# Patient Record
Sex: Female | Born: 1960
Health system: Southern US, Community
[De-identification: ages and names within clinical notes are randomized; demographics above are authoritative.]

## PROBLEM LIST (undated history)

## (undated) DIAGNOSIS — R519 Headache, unspecified: Secondary | ICD-10-CM

## (undated) DIAGNOSIS — R011 Cardiac murmur, unspecified: Secondary | ICD-10-CM

## (undated) DIAGNOSIS — R0600 Dyspnea, unspecified: Secondary | ICD-10-CM

## (undated) DIAGNOSIS — J449 Chronic obstructive pulmonary disease, unspecified: Secondary | ICD-10-CM

## (undated) HISTORY — PX: OTHER SURGICAL HISTORY: SHX169

## (undated) HISTORY — PX: CHOLECYSTECTOMY: SHX55

---

## 2005-04-19 ENCOUNTER — Emergency Department (HOSPITAL_COMMUNITY): Admission: EM | Admit: 2005-04-19 | Discharge: 2005-04-19 | Payer: Self-pay | Admitting: Emergency Medicine

## 2009-05-02 ENCOUNTER — Emergency Department (HOSPITAL_COMMUNITY): Admission: EM | Admit: 2009-05-02 | Discharge: 2009-05-02 | Payer: Self-pay | Admitting: Emergency Medicine

## 2009-05-04 ENCOUNTER — Encounter (INDEPENDENT_AMBULATORY_CARE_PROVIDER_SITE_OTHER): Payer: Self-pay | Admitting: *Deleted

## 2010-11-15 LAB — COMPREHENSIVE METABOLIC PANEL
ALT: 14 U/L (ref 0–35)
Alkaline Phosphatase: 39 U/L (ref 39–117)
BUN: 6 mg/dL (ref 6–23)
CO2: 25 mEq/L (ref 19–32)
Chloride: 107 mEq/L (ref 96–112)
GFR calc non Af Amer: >60 mL/min (ref >60)
Glucose, Bld: 93 mg/dL (ref 70–99)
Potassium: 4.6 mEq/L (ref 3.5–5.1)
Sodium: 139 mEq/L (ref 135–145)
Total Bilirubin: 1 mg/dL (ref 0.3–1.2)

## 2010-11-15 LAB — DIFFERENTIAL
Basophils Absolute: 0 10*3/uL (ref 0.0–0.1)
Basophils Relative: 0 % (ref 0–1)
Eosinophils Absolute: 0.1 10*3/uL (ref 0.0–0.7)
Monocytes Relative: 6 % (ref 3–12)
Neutro Abs: 4.9 10*3/uL (ref 1.7–7.7)
Neutrophils Relative %: 62 % (ref 43–77)

## 2010-11-15 LAB — URINALYSIS, ROUTINE W REFLEX MICROSCOPIC
Bilirubin Urine: NEGATIVE
Nitrite: NEGATIVE
Specific Gravity, Urine: 1.025 (ref 1.005–1.030)
Urobilinogen, UA: 0.2 mg/dL (ref 0.0–1.0)
pH: 6 (ref 5.0–8.0)

## 2010-11-15 LAB — CBC
HCT: 44.6 % (ref 36.0–46.0)
Hemoglobin: 15.8 g/dL — ABNORMAL HIGH (ref 12.0–15.0)
RBC: 4.5 MIL/uL (ref 3.87–5.11)

## 2010-11-15 LAB — URINE CULTURE

## 2010-11-15 LAB — URINE MICROSCOPIC-ADD ON

## 2010-11-15 LAB — PREGNANCY, URINE: Preg Test, Ur: NEGATIVE

## 2013-02-03 ENCOUNTER — Emergency Department (HOSPITAL_COMMUNITY)
Admission: EM | Admit: 2013-02-03 | Discharge: 2013-02-03 | Disposition: A | Discharge status: Home / Self Care | Payer: Self-pay | Attending: Emergency Medicine | Admitting: Emergency Medicine

## 2013-02-03 ENCOUNTER — Emergency Department (HOSPITAL_COMMUNITY): Payer: Self-pay

## 2013-02-03 ENCOUNTER — Encounter (HOSPITAL_COMMUNITY): Payer: Self-pay | Admitting: *Deleted

## 2013-02-03 DIAGNOSIS — F17200 Nicotine dependence, unspecified, uncomplicated: Secondary | ICD-10-CM | POA: Insufficient documentation

## 2013-02-03 DIAGNOSIS — W208XXA Other cause of strike by thrown, projected or falling object, initial encounter: Secondary | ICD-10-CM | POA: Insufficient documentation

## 2013-02-03 DIAGNOSIS — S9001XA Contusion of right ankle, initial encounter: Secondary | ICD-10-CM

## 2013-02-03 DIAGNOSIS — S9000XA Contusion of unspecified ankle, initial encounter: Secondary | ICD-10-CM | POA: Insufficient documentation

## 2013-02-03 DIAGNOSIS — Y929 Unspecified place or not applicable: Secondary | ICD-10-CM | POA: Insufficient documentation

## 2013-02-03 DIAGNOSIS — Y939 Activity, unspecified: Secondary | ICD-10-CM | POA: Insufficient documentation

## 2013-02-03 MED ORDER — IBUPROFEN 600 MG PO TABS: 600.0000 mg | ORAL_TABLET | Freq: Four times a day (QID) | ORAL | Status: DC | PRN

## 2013-02-03 MED ORDER — HYDROCODONE-ACETAMINOPHEN 5-325 MG PO TABS: 1.0000 | ORAL_TABLET | ORAL | Status: DC | PRN

## 2013-02-03 NOTE — ED Notes (Signed)
Rt ankle pain,since Monday, Pt was placing a box with lawn mower parts down on floor and the box hit the medial side of ankle. Contusion present.  Good dp pulse.

## 2013-02-03 NOTE — ED Notes (Signed)
Pt hit rt ankle on a box of lawnmower parts on Monday, pt co increasing pain and swelling, moderate discoloration to inner ankle.

## 2013-02-03 NOTE — ED Provider Notes (Signed)
Medical screening examination/treatment/procedure(s) were performed by non-physician practitioner and as supervising physician I was immediately available for consultation/collaboration. Devoria Albe, MD, Armando Gang   Ward Givens, MD 02/03/13 [phone]

## 2013-02-03 NOTE — ED Provider Notes (Signed)
History    CSN: [id number] Arrival date & time 02/03/13  4098  First MD Initiated Contact with Patient 02/03/13 2039     Chief Complaint  Patient presents with  . Ankle Pain    Rt ankle pain   (Consider location/radiation/quality/duration/timing/severity/associated sxs/prior Treatment) Patient is a 52 y.o. female presenting with ankle pain. The history is provided by the patient.  Ankle Pain Location:  Ankle (right) Time since incident:  3 days Injury: yes   Mechanism of injury comment:  Dropped a box full of stuff that was heavy on inside of ankle Ankle location:  R ankle Pain details:    Quality:  Aching and throbbing   Radiates to:  Does not radiate   Severity:  Moderate   Duration:  3 days   Progression:  Unchanged Chronicity:  New Dislocation: no   Foreign body present:  No foreign bodies Prior injury to area:  No Relieved by:  Nothing Worsened by:  Bearing weight Ineffective treatments:  None tried Associated symptoms: no fever and no neck pain    Crystal Silva is a 52 y.o. female who presents to the ED with right ankle pain x 3 days after dropping a box on the inner aspect of the ankle. She denies any other injuries. The pain has not gotten any better since the injury. There is swelling and bruising.  History reviewed. No pertinent past medical history. Past Surgical History  Procedure Laterality Date  . Cholecystectomy    . Lt knee surgery     History reviewed. No pertinent family history. History  Substance Use Topics  . Smoking status: Current Every Day Smoker -- 1.00 packs/day  . Smokeless tobacco: Not on file  . Alcohol Use: Yes   OB History   Grav Para Term Preterm Abortions TAB SAB Ect Mult Living                 Review of Systems  Constitutional: Negative for fever and activity change.  HENT: Negative for neck pain.   Respiratory: Negative for shortness of breath.   Gastrointestinal: Negative for nausea.  Musculoskeletal:       Right ankle  pain  Skin: Negative for wound.  Psychiatric/Behavioral: The patient is not nervous/anxious.     Allergies  Review of patient's allergies indicates no known allergies.  Home Medications  No current outpatient prescriptions on file. BP 152/61  Pulse 88  Temp(Src) 98 F (36.7 C) (Oral)  Resp 16  SpO2 98%  LMP 01/20/2013 Physical Exam  Nursing note and vitals reviewed. Constitutional: She is oriented to person, place, and time. She appears well-developed and well-nourished. No distress.  HENT:  Head: Normocephalic.  Eyes: EOM are normal.  Neck: Neck supple.  Cardiovascular: Normal rate.   Pulmonary/Chest: Effort normal.  Musculoskeletal:       Right ankle: She exhibits decreased range of motion, swelling and ecchymosis. She exhibits no deformity, no laceration and normal pulse. Tenderness. Medial malleolus tenderness found. Achilles tendon normal.       Feet:  Neurological: She is alert and oriented to person, place, and time. No cranial nerve deficit.  Skin: Skin is warm and dry.  Psychiatric: She has a normal mood and affect. Her behavior is normal.    ED Course  Procedures (including critical care time) Labs Reviewed - No data to display Dg Ankle Complete Right  02/03/2013   *RADIOLOGY REPORT*  Clinical Data: Heavy box dropped on ankle.  Pain.  RIGHT ANKLE - COMPLETE  3+ VIEW  Comparison: None.  Findings: Medial soft tissue swelling.  No fracture or dislocation. Small Achilles spur.  IMPRESSION: Soft tissue swelling.  No fracture.   Original Report Authenticated By: Davonna Belling, M.D.    MDM  52 y.o. female with right ankle injury from 3 days ago. Will apply ASO, she is to elevate, apply ice take the medications for pain and inflammation as directed and return as needed.  Discussed with the patient x-ray and clinical findings and plan of care. All questioned fully answered.    Medication List    TAKE these medications       HYDROcodone-acetaminophen 5-325 MG per tablet   Commonly known as:  NORCO/VICODIN  Take 1 tablet by mouth every 4 (four) hours as needed.     ibuprofen 600 MG tablet  Commonly known as:  ADVIL,MOTRIN  Take 1 tablet (600 mg total) by mouth every 6 (six) hours as needed for pain.         [place], [place] 02/03/13 [phone]

## 2014-02-01 ENCOUNTER — Encounter (HOSPITAL_COMMUNITY): Payer: Self-pay | Admitting: Emergency Medicine

## 2014-02-01 ENCOUNTER — Emergency Department (HOSPITAL_COMMUNITY)
Admission: EM | Admit: 2014-02-01 | Discharge: 2014-02-01 | Disposition: A | Discharge status: Home / Self Care | Payer: BC Managed Care – PPO | Attending: Emergency Medicine | Admitting: Emergency Medicine

## 2014-02-01 DIAGNOSIS — F17200 Nicotine dependence, unspecified, uncomplicated: Secondary | ICD-10-CM | POA: Insufficient documentation

## 2014-02-01 DIAGNOSIS — Z792 Long term (current) use of antibiotics: Secondary | ICD-10-CM | POA: Insufficient documentation

## 2014-02-01 DIAGNOSIS — J0100 Acute maxillary sinusitis, unspecified: Secondary | ICD-10-CM

## 2014-02-01 DIAGNOSIS — IMO0002 Reserved for concepts with insufficient information to code with codable children: Secondary | ICD-10-CM | POA: Insufficient documentation

## 2014-02-01 DIAGNOSIS — Z79899 Other long term (current) drug therapy: Secondary | ICD-10-CM | POA: Insufficient documentation

## 2014-02-01 MED ORDER — DESLORATADINE 5 MG PO TABS: 5.0000 mg | ORAL_TABLET | Freq: Every day | ORAL | Status: DC | PRN

## 2014-02-01 MED ORDER — AZITHROMYCIN 250 MG PO TABS: 250.0000 mg | ORAL_TABLET | Freq: Every day | ORAL | Status: DC

## 2014-02-01 MED ORDER — PREDNISONE 20 MG PO TABS: 40.0000 mg | ORAL_TABLET | Freq: Every day | ORAL | Status: DC

## 2014-02-01 NOTE — Discharge Instructions (Signed)
Sinusitis Sinusitis is redness, soreness, and swelling (inflammation) of the paranasal sinuses. Paranasal sinuses are air pockets within the bones of your face (beneath the eyes, the middle of the forehead, or above the eyes). In healthy paranasal sinuses, mucus is able to drain out, and air is able to circulate through them by way of your nose. However, when your paranasal sinuses are inflamed, mucus and air can become trapped. This can allow bacteria and other germs to grow and cause infection. Sinusitis can develop quickly and last only a short time (acute) or continue over a long period (chronic). Sinusitis that lasts for more than 12 weeks is considered chronic.  CAUSES  Causes of sinusitis include:  Allergies.  Structural abnormalities, such as displacement of the cartilage that separates your nostrils (deviated septum), which can decrease the air flow through your nose and sinuses and affect sinus drainage.  Functional abnormalities, such as when the small hairs (cilia) that line your sinuses and help remove mucus do not work properly or are not present. SYMPTOMS  Symptoms of acute and chronic sinusitis are the same. The primary symptoms are pain and pressure around the affected sinuses. Other symptoms include:  Upper toothache.  Earache.  Headache.  Bad breath.  Decreased sense of smell and taste.  A cough, which worsens when you are lying flat.  Fatigue.  Fever.  Thick drainage from your nose, which often is green and may contain pus (purulent).  Swelling and warmth over the affected sinuses. DIAGNOSIS  Your caregiver will perform a physical exam. During the exam, your caregiver may:  Look in your nose for signs of abnormal growths in your nostrils (nasal polyps).  Tap over the affected sinus to check for signs of infection.  View the inside of your sinuses (endoscopy) with a special imaging device with a light attached (endoscope), which is inserted into your  sinuses. If your caregiver suspects that you have chronic sinusitis, one or more of the following tests may be recommended:  Allergy tests.  Nasal culture--A sample of mucus is taken from your nose and sent to a lab and screened for bacteria.  Nasal cytology--A sample of mucus is taken from your nose and examined by your caregiver to determine if your sinusitis is related to an allergy. TREATMENT  Most cases of acute sinusitis are related to a viral infection and will resolve on their own within 10 days. Sometimes medicines are prescribed to help relieve symptoms (pain medicine, decongestants, nasal steroid sprays, or saline sprays).  However, for sinusitis related to a bacterial infection, your caregiver will prescribe antibiotic medicines. These are medicines that will help kill the bacteria causing the infection.  Rarely, sinusitis is caused by a fungal infection. In theses cases, your caregiver will prescribe antifungal medicine. For some cases of chronic sinusitis, surgery is needed. Generally, these are cases in which sinusitis recurs more than 3 times per year, despite other treatments. HOME CARE INSTRUCTIONS   Drink plenty of water. Water helps thin the mucus so your sinuses can drain more easily.  Use a humidifier.  Inhale steam 3 to 4 times a day (for example, sit in the bathroom with the shower running).  Apply a warm, moist washcloth to your face 3 to 4 times a day, or as directed by your caregiver.  Use saline nasal sprays to help moisten and clean your sinuses.  Take over-the-counter or prescription medicines for pain, discomfort, or fever only as directed by your caregiver. SEEK IMMEDIATE MEDICAL CARE IF:    You have increasing pain or severe headaches.  You have nausea, vomiting, or drowsiness.  You have swelling around your face.  You have vision problems.  You have a stiff neck.  You have difficulty breathing. MAKE SURE YOU:   Understand these  instructions.  Will watch your condition.  Will get help right away if you are not doing well or get worse. Document Released: 07/28/2005 Document Revised: 10/20/2011 Document Reviewed: 08/12/2011 ExitCare Patient Information 2015 [employer]. This information is not intended to replace advice given to you by your health care provider. Make sure you discuss any questions you have with your health care provider.  

## 2014-02-01 NOTE — ED Notes (Signed)
Worsening sob x 1 wk with cough and nasal congestion.

## 2014-02-07 NOTE — ED Provider Notes (Signed)
CSN: [id number]     Arrival date & time 02/01/14  1718 History   First MD Initiated Contact with Patient 02/01/14 1729     Chief Complaint  Patient presents with  . Shortness of Breath     (Consider location/radiation/quality/duration/timing/severity/associated sxs/prior Treatment) HPI  53 year old female presenting with Lopid over one week of facial congestion/pressure and intermittent cough. Some mild shortness of breath. No fevers or chills. Exposure or around her nose and eyes. Worse with leaning forward. Associated with sensation of drainage in the back of her throat. She feels like this makes her cough at times. Mild shortness of breath. No neck pain or stiffness. No ear pain. Has tried some over-the-counter decongestants with minimal relief.  History reviewed. No pertinent past medical history. Past Surgical History  Procedure Laterality Date  . Cholecystectomy    . Lt knee surgery     No family history on file. History  Substance Use Topics  . Smoking status: Current Every Day Smoker -- 1.00 packs/day    Types: Cigarettes  . Smokeless tobacco: Not on file  . Alcohol Use: No   OB History   Grav Para Term Preterm Abortions TAB SAB Ect Mult Living                 Review of Systems  All systems reviewed and negative, other than as noted in HPI.   Allergies  Review of patient's allergies indicates no known allergies.  Home Medications   Prior to Admission medications   Medication Sig Start Date End Date Taking? Authorizing Annella Prowell  azithromycin (ZITHROMAX) 250 MG tablet Take 1 tablet (250 mg total) by mouth daily. Take first 2 tablets together, then 1 every day until finished. 02/01/14   Raeford RazorStephen Kohut, MD  desloratadine (CLARINEX) 5 MG tablet Take 1 tablet (5 mg total) by mouth daily as needed. 02/01/14   Raeford RazorStephen Kohut, MD  HYDROcodone-acetaminophen (NORCO/VICODIN) 5-325 MG per tablet Take 1 tablet by mouth every 4 (four) hours as needed. 02/03/13   Hope Orlene OchM Neese, NP   ibuprofen (ADVIL,MOTRIN) 600 MG tablet Take 1 tablet (600 mg total) by mouth every 6 (six) hours as needed for pain. 02/03/13   Hope Orlene OchM Neese, NP  predniSONE (DELTASONE) 20 MG tablet Take 2 tablets (40 mg total) by mouth daily. 02/01/14   Raeford RazorStephen Kohut, MD   BP 122/65  Pulse 76  Temp(Src) 97.9 F (36.6 C) (Oral)  Resp 16  Ht 5\' 9"  (1.753 m)  Wt 180 lb (81.647 kg)  BMI 26.57 kg/m2  SpO2 98%  LMP 12/02/2013 Physical Exam  Nursing note and vitals reviewed. Constitutional: She appears well-developed and well-nourished. No distress.  HENT:  Head: Normocephalic and atraumatic.  Maxillary sinus tenderness. Boggy nasal mucosa. Posterior pharynx is clear. Handling secretions. Normal stomach phonation. Neck is supple. No adenopathy. Normal tympanic membranes and external auditory canals bilaterally.  Eyes: EOM are normal. Pupils are equal, round, and reactive to light. Right eye exhibits no discharge. Left eye exhibits no discharge.  Neck: Neck supple.  Cardiovascular: Normal rate, regular rhythm and normal heart sounds.  Exam reveals no gallop and no friction rub.   No murmur heard. Pulmonary/Chest: Effort normal and breath sounds normal. No stridor. No respiratory distress.  Abdominal: Soft. She exhibits no distension. There is no tenderness.  Musculoskeletal: She exhibits no edema and no tenderness.  Lymphadenopathy:    She has no cervical adenopathy.  Neurological: She is alert.  Skin: Skin is warm and dry.  Psychiatric: She has  a normal mood and affect. Her behavior is normal. Thought content normal.    ED Course  Procedures (including critical care time) Labs Review Labs Reviewed - No data to display  Imaging Review No results found.   EKG Interpretation None      MDM   Final diagnoses:  Acute maxillary sinusitis, recurrence not specified    53 year old female with symptoms consistent with sinusitis. No respiratory distress. Nontoxic. Plan treatment for  sinusitis.    Raeford RazorStephen Kohut, MD 02/07/14 0430

## 2020-04-06 ENCOUNTER — Encounter: Payer: Self-pay | Admitting: Emergency Medicine

## 2020-04-06 ENCOUNTER — Ambulatory Visit
Admission: EM | Admit: 2020-04-06 | Discharge: 2020-04-06 | Disposition: A | Discharge status: Home / Self Care | Payer: Self-pay

## 2020-04-06 ENCOUNTER — Other Ambulatory Visit: Payer: Self-pay

## 2020-04-06 ENCOUNTER — Ambulatory Visit (INDEPENDENT_AMBULATORY_CARE_PROVIDER_SITE_OTHER): Payer: Self-pay

## 2020-04-06 DIAGNOSIS — M791 Myalgia, unspecified site: Secondary | ICD-10-CM

## 2020-04-06 DIAGNOSIS — R0789 Other chest pain: Secondary | ICD-10-CM

## 2020-04-06 MED ORDER — PREDNISONE 10 MG (21) PO TBPK: ORAL_TABLET | ORAL | 0 refills | Status: DC

## 2020-04-06 NOTE — ED Triage Notes (Signed)
Pain and a knot to Upper RT side of chest with movement or deep breath x 2weeks.  States she was at work plunging a sink and heard something pop in that area and then started hurting

## 2020-04-06 NOTE — ED Provider Notes (Signed)
[facility]   [id number] 04/06/20 Arrival Time: 1344   Chief Complaint  Patient presents with  . Muscle Pain     SUBJECTIVE: History from: patient.  Crystal Silva is a 59 y.o. female who presented to the urgent care with a complaint of right upper chest pain for 2 weeks.  Report is not near the sternum.  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  She localizes the pain to the right upper chest.  He describes the pain as constant and achy.  She has tried OTC medications without relief.  Her symptoms are made worse with ROM.  She denies similar symptoms in the past.  Denies chills, fever, nausea, vomiting, diarrhea   ROS: As per HPI.  All other pertinent ROS negative.     History reviewed. No pertinent past medical history. Past Surgical History:  Procedure Laterality Date  . CHOLECYSTECTOMY    . lt knee surgery     No Known Allergies No current facility-administered medications on file prior to encounter.   Current Outpatient Medications on File Prior to Encounter  Medication Sig Dispense Refill  . cetirizine (ZYRTEC) 10 MG tablet Take 10 mg by mouth daily.    Marland Kitchen azithromycin (ZITHROMAX) 250 MG tablet Take 1 tablet (250 mg total) by mouth daily. Take first 2 tablets together, then 1 every day until finished. 6 tablet 0  . desloratadine (CLARINEX) 5 MG tablet Take 1 tablet (5 mg total) by mouth daily as needed. 10 tablet 0  . HYDROcodone-acetaminophen (NORCO/VICODIN) 5-325 MG per tablet Take 1 tablet by mouth every 4 (four) hours as needed. 15 tablet 0  . ibuprofen (ADVIL,MOTRIN) 600 MG tablet Take 1 tablet (600 mg total) by mouth every 6 (six) hours as needed for pain. 30 tablet 0   Social History   Socioeconomic History  . Marital status: Single    Spouse name: Not on file  . Number of children: Not on file  . Years of education: Not on file  . Highest education level: Not on file  Occupational History  . Not on file  Tobacco Use  . Smoking  status: Current Every Day Smoker    Packs/day: 1.00    Types: Cigarettes  . Smokeless tobacco: Never Used  Substance and Sexual Activity  . Alcohol use: No  . Drug use: No  . Sexual activity: Not on file  Other Topics Concern  . Not on file  Social History Narrative  . Not on file   Social Determinants of Health   Financial Resource Strain:   . Difficulty of Paying Living Expenses: Not on file  Food Insecurity:   . Worried About Programme researcher, broadcasting/film/video in the Last Year: Not on file  . Ran Out of Food in the Last Year: Not on file  Transportation Needs:   . Lack of Transportation (Medical): Not on file  . Lack of Transportation (Non-Medical): Not on file  Physical Activity:   . Days of Exercise per Week: Not on file  . Minutes of Exercise per Session: Not on file  Stress:   . Feeling of Stress : Not on file  Social Connections:   . Frequency of Communication with Friends and Family: Not on file  . Frequency of Social Gatherings with Friends and Family: Not on file  . Attends Religious Services: Not on file  . Active Member of Clubs or Organizations: Not on file  . Attends Banker Meetings: Not on file  .  Marital Status: Not on file  Intimate Partner Violence:   . Fear of Current or Ex-Partner: Not on file  . Emotionally Abused: Not on file  . Physically Abused: Not on file  . Sexually Abused: Not on file   No family history on file.  OBJECTIVE:  Vitals:   04/06/20 1409 04/06/20 1411  BP:  (!) 145/82  Pulse:  83  Resp:  19  Temp:  97.9 F (36.6 C)  TempSrc:  Oral  SpO2:  99%  Weight: 155 lb (70.3 kg)   Height: 5\' 9"  (1.753 m)      Physical Exam Vitals and nursing note reviewed.  Constitutional:      General: She is not in acute distress.    Appearance: Normal appearance. She is normal weight. She is not ill-appearing, toxic-appearing or diaphoretic.  HENT:     Head: Normocephalic.  Cardiovascular:     Rate and Rhythm: Normal rate and regular  rhythm.     Pulses: Normal pulses.     Heart sounds: Normal heart sounds. No murmur heard.  No friction rub. No gallop.   Pulmonary:     Effort: Pulmonary effort is normal. No respiratory distress.     Breath sounds: Normal breath sounds. No stridor. No wheezing, rhonchi or rales.  Chest:     Chest wall: No tenderness.  Musculoskeletal:        General: Tenderness present.     Comments: Right upper chest is without any obvious deformity when compared to the left upper chest.  Patient is able to deep breathing without pain.  Neurological:     Mental Status: She is alert and oriented to person, place, and time.     LABS:  No results found for this or any previous visit (from the past 24 hour(s)).   RADIOLOGY  DG Chest 2 View  Result Date: 04/06/2020 CLINICAL DATA:  59 year old female with pleuritic chest pain for 2 weeks, after "felt a pop" plunging a sink. Rib pain and palpable abnormality. Smoker. EXAM: CHEST - 2 VIEW COMPARISON:  None. FINDINGS: Hyperinflated lungs with symmetric coarse bilateral pulmonary interstitial opacity. No pneumothorax or pleural effusion. No confluent pulmonary opacity. Normal cardiac size and mediastinal contours. Visualized tracheal air column is within normal limits. No displaced right rib fracture identified. Osteopenia. No acute osseous abnormality identified. Right upper quadrant surgical clips. Negative visible bowel gas pattern. IMPRESSION: 1. Chronic lung disease with hyperinflation and coarse bilateral interstitial opacity. 2. No right rib fracture or acute cardiopulmonary abnormality identified. Electronically Signed   By: 59 M.D.   On: 04/06/2020 14:46    Chest-X-ray is negative for bony abnormality including fracture or dislocation.  I have reviewed the x-ray myself and the radiologist interpretation.  I am in agreement with the radiologist interpretation.  ASSESSMENT & PLAN:  1. Muscle pain     Meds ordered this encounter  Medications    . predniSONE (STERAPRED UNI-PAK 21 TAB) 10 MG (21) TBPK tablet    Sig: Take 6 tabs by mouth daily  for 1 days, then 5 tabs for 1 days, then 4 tabs for 1 days, then 3 tabs for 1 days, 2 tabs for 1 days, then 1 tab by mouth daily for 1 days    Dispense:  21 tablet    Refill:  0    Discharge Instructions.  Prednisone was prescribed /take as directed Take OTC Tylenol for pain management Follow RICE instruction that is attached Follow-up with PCP Return or go  to ED for worsening symptoms  Reviewed expectations re: course of current medical issues. Questions answered. Outlined signs and symptoms indicating need for more acute intervention. Patient verbalized understanding. After Visit Summary given.      Note: This document was prepared using Dragon voice recognition software and may include unintentional dictation errors.    Durward Parcel, FNP 04/06/20 1505

## 2020-04-06 NOTE — Discharge Instructions (Addendum)
Prednisone was prescribed /take as directed Take OTC Tylenol for pain management Follow RICE instruction that is attached Follow-up with PCP Return or go to ED for worsening symptoms

## 2021-04-28 ENCOUNTER — Other Ambulatory Visit: Payer: Self-pay

## 2021-04-28 ENCOUNTER — Encounter (HOSPITAL_COMMUNITY): Payer: Self-pay | Admitting: *Deleted

## 2021-04-28 ENCOUNTER — Emergency Department (HOSPITAL_COMMUNITY): Payer: Commercial Managed Care - PPO

## 2021-04-28 ENCOUNTER — Inpatient Hospital Stay (HOSPITAL_COMMUNITY)
Admission: EM | Admit: 2021-04-28 | Discharge: 2021-05-02 | DRG: 190 | Disposition: A | Discharge status: Home / Self Care | Payer: Commercial Managed Care - PPO | Attending: Internal Medicine | Admitting: Internal Medicine

## 2021-04-28 DIAGNOSIS — Q230 Congenital stenosis of aortic valve: Secondary | ICD-10-CM

## 2021-04-28 DIAGNOSIS — Z9049 Acquired absence of other specified parts of digestive tract: Secondary | ICD-10-CM

## 2021-04-28 DIAGNOSIS — J449 Chronic obstructive pulmonary disease, unspecified: Secondary | ICD-10-CM | POA: Diagnosis present

## 2021-04-28 DIAGNOSIS — R0600 Dyspnea, unspecified: Secondary | ICD-10-CM | POA: Diagnosis not present

## 2021-04-28 DIAGNOSIS — E8770 Fluid overload, unspecified: Secondary | ICD-10-CM | POA: Diagnosis present

## 2021-04-28 DIAGNOSIS — M954 Acquired deformity of chest and rib: Secondary | ICD-10-CM | POA: Diagnosis present

## 2021-04-28 DIAGNOSIS — J441 Chronic obstructive pulmonary disease with (acute) exacerbation: Principal | ICD-10-CM | POA: Diagnosis present

## 2021-04-28 DIAGNOSIS — J9621 Acute and chronic respiratory failure with hypoxia: Secondary | ICD-10-CM | POA: Diagnosis present

## 2021-04-28 DIAGNOSIS — Z20822 Contact with and (suspected) exposure to covid-19: Secondary | ICD-10-CM | POA: Diagnosis present

## 2021-04-28 DIAGNOSIS — R911 Solitary pulmonary nodule: Secondary | ICD-10-CM | POA: Diagnosis present

## 2021-04-28 DIAGNOSIS — I359 Nonrheumatic aortic valve disorder, unspecified: Secondary | ICD-10-CM | POA: Diagnosis present

## 2021-04-28 DIAGNOSIS — Z79899 Other long term (current) drug therapy: Secondary | ICD-10-CM

## 2021-04-28 DIAGNOSIS — R079 Chest pain, unspecified: Secondary | ICD-10-CM | POA: Diagnosis present

## 2021-04-28 DIAGNOSIS — R0609 Other forms of dyspnea: Secondary | ICD-10-CM

## 2021-04-28 DIAGNOSIS — F17200 Nicotine dependence, unspecified, uncomplicated: Secondary | ICD-10-CM | POA: Diagnosis present

## 2021-04-28 DIAGNOSIS — Q231 Congenital insufficiency of aortic valve: Secondary | ICD-10-CM

## 2021-04-28 DIAGNOSIS — R7989 Other specified abnormal findings of blood chemistry: Secondary | ICD-10-CM | POA: Diagnosis present

## 2021-04-28 DIAGNOSIS — R002 Palpitations: Secondary | ICD-10-CM | POA: Diagnosis present

## 2021-04-28 DIAGNOSIS — I350 Nonrheumatic aortic (valve) stenosis: Secondary | ICD-10-CM

## 2021-04-28 DIAGNOSIS — Z8249 Family history of ischemic heart disease and other diseases of the circulatory system: Secondary | ICD-10-CM

## 2021-04-28 DIAGNOSIS — F17210 Nicotine dependence, cigarettes, uncomplicated: Secondary | ICD-10-CM | POA: Diagnosis present

## 2021-04-28 MED ORDER — ALBUTEROL SULFATE HFA 108 (90 BASE) MCG/ACT IN AERS
2.0000 | INHALATION_SPRAY | Freq: Once | RESPIRATORY_TRACT | Status: AC
  Administered 2021-04-29: 2 via RESPIRATORY_TRACT
  Filled 2021-04-28: qty 6.7

## 2021-04-28 NOTE — ED Triage Notes (Signed)
Pt with SOB for over a week but has gotten worse today. Denies fevers or cough. Denies any hx of SOB or known covid exposure.

## 2021-04-28 NOTE — ED Provider Notes (Signed)
[facility] EMERGENCY DEPARTMENT Provider Note   CSN: [id number] Arrival date & time: 04/28/21  2157     History Chief Complaint  Patient presents with   Shortness of Breath    Crystal Silva is a 61 y.o. female.  Patient with no known medical history of tobacco abuse and no regular medications here with exertional shortness of breath over the past 1 week.  States she first noticed that when she was at the football game last week and had difficulty breathing with "heart exploding out of her chest" with climbing stairs.  She denies chest pain but felt tight in her chest.  She has no pain or difficulty breathing at rest But only When she gets up and walks around.  No fever or cough.  Did have some leg swelling which has since resolved.  Denies any cardiac history.  She is never had a stress test.  Denies any history of COPD or asthma but continues to smoke.  No leg swelling currently.  Normally sleeps propped up in a recliner.  She denies any fever or cough.  Does have some congestion which she attributes to allergies.  States she had some pain to her left armpit breast several days ago lasted few minutes and has since resolved. Feels like she is still short of breath now but worse when she gets up and tries to walk around.  She feels palpitations and tightness in her chest when she tries to move around but not at rest  The history is provided by the patient.  Shortness of Breath Associated symptoms: no abdominal pain, no chest pain, no cough, no fever, no headaches, no rash and no vomiting       History reviewed. No pertinent past medical history.  There are no problems to display for this patient.   Past Surgical History:  Procedure Laterality Date   CHOLECYSTECTOMY     lt knee surgery       OB History   No obstetric history on file.     History reviewed. No pertinent family history.  Social History   Tobacco Use   Smoking status: Every Day    Packs/day: 1.00    Types:  Cigarettes   Smokeless tobacco: Never  Substance Use Topics   Alcohol use: No   Drug use: No    Home Medications Prior to Admission medications   Medication Sig Start Date End Date Taking? Authorizing Provider  azithromycin (ZITHROMAX) 250 MG tablet Take 1 tablet (250 mg total) by mouth daily. Take first 2 tablets together, then 1 every day until finished. 02/01/14   Raeford Razor, MD  cetirizine (ZYRTEC) 10 MG tablet Take 10 mg by mouth daily.    [provider]  desloratadine (CLARINEX) 5 MG tablet Take 1 tablet (5 mg total) by mouth daily as needed. 02/01/14   Raeford Razor, MD  HYDROcodone-acetaminophen (NORCO/VICODIN) 5-325 MG per tablet Take 1 tablet by mouth every 4 (four) hours as needed. 02/03/13   Janne Napoleon, NP  ibuprofen (ADVIL,MOTRIN) 600 MG tablet Take 1 tablet (600 mg total) by mouth every 6 (six) hours as needed for pain. 02/03/13   Janne Napoleon, NP  predniSONE (STERAPRED UNI-PAK 21 TAB) 10 MG (21) TBPK tablet Take 6 tabs by mouth daily  for 1 days, then 5 tabs for 1 days, then 4 tabs for 1 days, then 3 tabs for 1 days, 2 tabs for 1 days, then 1 tab by mouth daily for 1 days 04/06/20  Durward Parcel, FNP    Allergies    Patient has no known allergies.  Review of Systems   Review of Systems  Constitutional:  Negative for activity change, appetite change and fever.  HENT:  Negative for congestion and rhinorrhea.   Respiratory:  Positive for chest tightness and shortness of breath. Negative for cough.   Cardiovascular:  Negative for chest pain.  Gastrointestinal:  Negative for abdominal pain, nausea and vomiting.  Genitourinary:  Negative for dysuria and hematuria.  Musculoskeletal:  Negative for arthralgias and myalgias.  Skin:  Negative for rash.  Neurological:  Negative for dizziness, weakness and headaches.   all other systems are negative except as noted in the HPI and PMH.   Physical Exam Updated Vital Signs BP 137/84 (BP Location: Right Arm)    Pulse 98   Temp 97.7 F (36.5 C) (Oral)   Resp 16   Ht 5\' 9"  (1.753 m)   Wt 72.6 kg   LMP 12/02/2013   SpO2 95%   BMI 23.63 kg/m   Physical Exam Vitals and nursing note reviewed.  Constitutional:      General: She is not in acute distress.    Appearance: She is well-developed.     Comments: Speaking in short sentence  HENT:     Head: Normocephalic and atraumatic.     Mouth/Throat:     Pharynx: No oropharyngeal exudate.  Eyes:     Conjunctiva/sclera: Conjunctivae normal.     Pupils: Pupils are equal, round, and reactive to light.  Neck:     Comments: No meningismus. Cardiovascular:     Rate and Rhythm: Normal rate and regular rhythm.     Heart sounds: Normal heart sounds. No murmur heard. Pulmonary:     Effort: Pulmonary effort is normal. No respiratory distress.     Breath sounds: Wheezing present.     Comments: Diminished, scattered expiratory wheezing Abdominal:     Palpations: Abdomen is soft.     Tenderness: There is no abdominal tenderness. There is no guarding or rebound.  Musculoskeletal:        General: No tenderness. Normal range of motion.     Cervical back: Normal range of motion and neck supple.  Skin:    General: Skin is warm.  Neurological:     Mental Status: She is alert and oriented to person, place, and time.     Cranial Nerves: No cranial nerve deficit.     Motor: No abnormal muscle tone.     Coordination: Coordination normal.     Comments:  5/5 strength throughout. CN 2-12 intact.Equal grip strength.   Psychiatric:        Behavior: Behavior normal.    ED Results / Procedures / Treatments   Labs (all labs ordered are listed, but only abnormal results are displayed) Labs Reviewed  CBC WITH DIFFERENTIAL/PLATELET - Abnormal; Notable for the following components:      Result Value   Hemoglobin 16.2 (*)    HCT 49.2 (*)    All other components within normal limits  BASIC METABOLIC PANEL - Abnormal; Notable for the following components:    Glucose, Bld 100 (*)    Calcium 8.3 (*)    Anion gap 4 (*)    All other components within normal limits  D-DIMER, QUANTITATIVE - Abnormal; Notable for the following components:   D-Dimer, Quant 2.47 (*)    All other components within normal limits  RESP PANEL BY RT-PCR (FLU A&B, COVID) ARPGX2  BRAIN NATRIURETIC  PEPTIDE  BLOOD GAS, VENOUS  HIV ANTIBODY (ROUTINE TESTING W REFLEX)  MAGNESIUM  CBC  COMPREHENSIVE METABOLIC PANEL  TROPONIN I (HIGH SENSITIVITY)  TROPONIN I (HIGH SENSITIVITY)    EKG EKG Interpretation  Date/Time:  Sunday April 28 2021 23:46:27 EDT Ventricular Rate:  83 PR Interval:  162 QRS Duration: 91 QT Interval:  383 QTC Calculation: 450 R Axis:   -12 Text Interpretation: Sinus rhythm Anterior infarct, old No significant change was found Confirmed by Glynn Octave [phone]) on 04/28/2021 11:52:41 PM  Radiology DG Chest 2 View  Result Date: 04/28/2021 CLINICAL DATA:  Shortness of breath EXAM: CHEST - 2 VIEW COMPARISON:  04/06/2020 FINDINGS: Hyperinflated lungs. The heart size and mediastinal contours are within normal limits. No focal pulmonary opacity. No pleural effusion or pneumothorax. No acute osseous abnormality. Right upper quadrant surgical clips. IMPRESSION: No acute cardiopulmonary disease. Electronically Signed   By: Wiliam Ke M.D.   On: 04/28/2021 22:27   CT Angio Chest PE W and/or Wo Contrast  Result Date: 04/29/2021 CLINICAL DATA:  PE suspected, low/intermediate prob, positive D-dimer. Dyspnea. EXAM: CT ANGIOGRAPHY CHEST WITH CONTRAST TECHNIQUE: Multidetector CT imaging of the chest was performed using the standard protocol during bolus administration of intravenous contrast. Multiplanar CT image reconstructions and MIPs were obtained to evaluate the vascular anatomy. CONTRAST:  55mL OMNIPAQUE IOHEXOL 350 MG/ML SOLN COMPARISON:  None. FINDINGS: Cardiovascular: There is adequate opacification of the pulmonary arterial tree. No intraluminal filling  defect identified to suggest acute pulmonary embolism. The central pulmonary arteries are of normal caliber. There is calcification of the aortic valve leaflets noted. Global cardiac size within normal limits. No significant coronary artery calcification. No pericardial effusion. Mild atherosclerotic calcification noted within the thoracic aorta. No aortic aneurysm. Mediastinum/Nodes: No enlarged mediastinal, hilar, or axillary lymph nodes. Thyroid gland and esophagus demonstrate no significant findings. Lungs/Pleura: Moderate centrilobular emphysema. The lungs appear mildly hyperinflated. Mild diffuse bronchial wall thickening is in keeping with airway inflammation. Mean 7 mm noncalcified pulmonary nodule is seen within the right upper lobe, axial image # 67/6, indeterminate. No focal pulmonary infiltrate. No pneumothorax or pleural effusion. No central obstructing mass. Layering debris is seen within the distal trachea. Upper Abdomen: Limited images of the upper abdomen demonstrates severe progressive atrophy of the right hepatic lobe and compensatory hypertrophy of the left hepatic lobe when compared to prior examination of 05/02/2009. Surgical clips are seen within the hepatic hilum, of unclear significance. Stable intrahepatic biliary ductal dilation within the atrophic right hepatic lobe. No acute abnormality. Musculoskeletal: Degenerative changes are seen within the thoracic spine. No lytic or blastic bone lesions are seen. Remote T12 superior endplate fracture. Review of the MIP images confirms the above findings. IMPRESSION: No pulmonary embolism. Moderate centrilobular emphysema. Mild bronchial wall thickening in keeping with chronic airway inflammation. 7 mm noncalcified indeterminate pulmonary nodule within the right upper lobe. Non-contrast chest CT at 6-12 months is recommended. If the nodule is stable at time of repeat CT, then future CT at 18-24 months (from today's scan) is considered optional for  low-risk patients, but is recommended for high-risk patients. This recommendation follows the consensus statement: Guidelines for Management of Incidental Pulmonary Nodules Detected on CT Images: From the Fleischner Society 2017; Radiology 2017; 284:228-243. Aortic valve calcification. Echocardiography may be helpful to assess the degree of valvular dysfunction. Progressive marked atrophy of the right hepatic lobe, likely related to chronic biliary obstruction, incompletely evaluated on this examination. This was better assessed on CT examination of 05/02/2009. Emphysema (  ICD10-J43.9). Electronically Signed   By: Helyn Numbers M.D.   On: 04/29/2021 01:26    Procedures Procedures   Medications Ordered in ED Medications  albuterol (VENTOLIN HFA) 108 (90 Base) MCG/ACT inhaler 2 puff (has no administration in time range)    ED Course  I have reviewed the triage vital signs and the nursing notes.  Pertinent labs & imaging results that were available during my care of the patient were reviewed by me and considered in my medical decision making (see chart for details).    MDM Rules/Calculators/A&P                          1 week of exertional shortness of breath or chest tightness.  She has no chest pain currently. EKG used for acute ischemia.  Lungs are clear with scant expiratory wheezing.  X-ray is negative  Patient given bronchodilators and steroids.  EKG shows no acute ischemia.  Troponin is negative.  D-dimer is positive.  CT is negative for pulmonary embolism but does show a lung nodule, calcified aortic valve, liver abnormality. Patient made aware of findings.   Presentation is concerning for dyspnea on exertion likely anginal equivalent.  COPD may also be playing a role. Troponins remain flat.  She does have exertional shortness of breath with chest tightness and has never had a cardiac work-up.  She does not have a doctor.  Continue treatment for COPD.  Will need evaluation for  angina as well likely cardiology evaluation echocardiogram given aortic valve findings.  Discussed with Dr. Carren Rang. Final Clinical Impression(s) / ED Diagnoses Final diagnoses:  DOE (dyspnea on exertion)  COPD exacerbation [facility])    Rx / DC Orders ED Discharge Orders     None        Kayde Atkerson, Jeannett Senior, MD 04/29/21 [phone]

## 2021-04-29 ENCOUNTER — Emergency Department (HOSPITAL_COMMUNITY): Payer: Commercial Managed Care - PPO

## 2021-04-29 ENCOUNTER — Encounter (HOSPITAL_COMMUNITY): Payer: Self-pay | Admitting: Radiology

## 2021-04-29 ENCOUNTER — Observation Stay (HOSPITAL_COMMUNITY): Payer: Commercial Managed Care - PPO

## 2021-04-29 DIAGNOSIS — R7989 Other specified abnormal findings of blood chemistry: Secondary | ICD-10-CM | POA: Diagnosis present

## 2021-04-29 DIAGNOSIS — R079 Chest pain, unspecified: Secondary | ICD-10-CM | POA: Diagnosis present

## 2021-04-29 DIAGNOSIS — Z720 Tobacco use: Secondary | ICD-10-CM | POA: Diagnosis not present

## 2021-04-29 DIAGNOSIS — Z8249 Family history of ischemic heart disease and other diseases of the circulatory system: Secondary | ICD-10-CM | POA: Diagnosis not present

## 2021-04-29 DIAGNOSIS — J449 Chronic obstructive pulmonary disease, unspecified: Secondary | ICD-10-CM | POA: Diagnosis not present

## 2021-04-29 DIAGNOSIS — M954 Acquired deformity of chest and rib: Secondary | ICD-10-CM | POA: Diagnosis present

## 2021-04-29 DIAGNOSIS — Z20822 Contact with and (suspected) exposure to covid-19: Secondary | ICD-10-CM | POA: Diagnosis present

## 2021-04-29 DIAGNOSIS — R0600 Dyspnea, unspecified: Secondary | ICD-10-CM | POA: Diagnosis present

## 2021-04-29 DIAGNOSIS — I350 Nonrheumatic aortic (valve) stenosis: Secondary | ICD-10-CM | POA: Diagnosis not present

## 2021-04-29 DIAGNOSIS — F17210 Nicotine dependence, cigarettes, uncomplicated: Secondary | ICD-10-CM | POA: Diagnosis present

## 2021-04-29 DIAGNOSIS — J9621 Acute and chronic respiratory failure with hypoxia: Secondary | ICD-10-CM | POA: Diagnosis present

## 2021-04-29 DIAGNOSIS — I359 Nonrheumatic aortic valve disorder, unspecified: Secondary | ICD-10-CM | POA: Diagnosis present

## 2021-04-29 DIAGNOSIS — Q230 Congenital stenosis of aortic valve: Secondary | ICD-10-CM | POA: Diagnosis not present

## 2021-04-29 DIAGNOSIS — R002 Palpitations: Secondary | ICD-10-CM | POA: Diagnosis present

## 2021-04-29 DIAGNOSIS — F17200 Nicotine dependence, unspecified, uncomplicated: Secondary | ICD-10-CM | POA: Diagnosis not present

## 2021-04-29 DIAGNOSIS — Q231 Congenital insufficiency of aortic valve: Secondary | ICD-10-CM | POA: Diagnosis not present

## 2021-04-29 DIAGNOSIS — J441 Chronic obstructive pulmonary disease with (acute) exacerbation: Secondary | ICD-10-CM | POA: Diagnosis present

## 2021-04-29 DIAGNOSIS — Z79899 Other long term (current) drug therapy: Secondary | ICD-10-CM | POA: Diagnosis not present

## 2021-04-29 DIAGNOSIS — E8770 Fluid overload, unspecified: Secondary | ICD-10-CM | POA: Diagnosis present

## 2021-04-29 DIAGNOSIS — R911 Solitary pulmonary nodule: Secondary | ICD-10-CM | POA: Diagnosis present

## 2021-04-29 DIAGNOSIS — Z9049 Acquired absence of other specified parts of digestive tract: Secondary | ICD-10-CM | POA: Diagnosis not present

## 2021-04-29 LAB — COMPREHENSIVE METABOLIC PANEL
ALT: 12 U/L (ref 0–44)
AST: 12 U/L — ABNORMAL LOW (ref 15–41)
Albumin: 3.7 g/dL (ref 3.5–5.0)
Alkaline Phosphatase: 44 U/L (ref 38–126)
Anion gap: 7 (ref 5–15)
BUN: 14 mg/dL (ref 6–20)
CO2: 26 mmol/L (ref 22–32)
Calcium: 8.5 mg/dL — ABNORMAL LOW (ref 8.9–10.3)
Chloride: 106 mmol/L (ref 98–111)
Creatinine, Ser: 0.56 mg/dL (ref 0.44–1.00)
GFR, Estimated: >60 mL/min (ref >60)
Glucose, Bld: 152 mg/dL — ABNORMAL HIGH (ref 70–99)
Potassium: 3.5 mmol/L (ref 3.5–5.1)
Sodium: 139 mmol/L (ref 135–145)
Total Bilirubin: 0.7 mg/dL (ref 0.3–1.2)
Total Protein: 6.3 g/dL — ABNORMAL LOW (ref 6.5–8.1)

## 2021-04-29 LAB — CBC WITH DIFFERENTIAL/PLATELET
Abs Immature Granulocytes: 0.01 10*3/uL (ref 0.00–0.07)
Basophils Absolute: 0.1 10*3/uL (ref 0.0–0.1)
Basophils Relative: 1 %
Eosinophils Absolute: 0.2 10*3/uL (ref 0.0–0.5)
Eosinophils Relative: 3 %
HCT: 49.2 % — ABNORMAL HIGH (ref 36.0–46.0)
Hemoglobin: 16.2 g/dL — ABNORMAL HIGH (ref 12.0–15.0)
Immature Granulocytes: 0 %
Lymphocytes Relative: 38 %
Lymphs Abs: 2.6 10*3/uL (ref 0.7–4.0)
MCH: 32.7 pg (ref 26.0–34.0)
MCHC: 32.9 g/dL (ref 30.0–36.0)
MCV: 99.2 fL (ref 80.0–100.0)
Monocytes Absolute: 0.4 10*3/uL (ref 0.1–1.0)
Monocytes Relative: 5 %
Neutro Abs: 3.6 10*3/uL (ref 1.7–7.7)
Neutrophils Relative %: 53 %
Platelets: 154 10*3/uL (ref 150–400)
RBC: 4.96 MIL/uL (ref 3.87–5.11)
RDW: 12.6 % (ref 11.5–15.5)
WBC: 6.8 10*3/uL (ref 4.0–10.5)
nRBC: 0 % (ref 0.0–0.2)

## 2021-04-29 LAB — BLOOD GAS, VENOUS
Acid-Base Excess: 1.6 mmol/L (ref 0.0–2.0)
Bicarbonate: 24.7 mmol/L (ref 20.0–28.0)
FIO2: 21
O2 Saturation: 78.3 %
Patient temperature: 36.5
pCO2, Ven: 46.9 mmHg (ref 44.0–60.0)
pH, Ven: 7.368 (ref 7.250–7.430)
pO2, Ven: 42.2 mmHg (ref 32.0–45.0)

## 2021-04-29 LAB — BASIC METABOLIC PANEL
Anion gap: 4 — ABNORMAL LOW (ref 5–15)
BUN: 15 mg/dL (ref 6–20)
CO2: 28 mmol/L (ref 22–32)
Calcium: 8.3 mg/dL — ABNORMAL LOW (ref 8.9–10.3)
Chloride: 106 mmol/L (ref 98–111)
Creatinine, Ser: 0.71 mg/dL (ref 0.44–1.00)
GFR, Estimated: >60 mL/min (ref >60)
Glucose, Bld: 100 mg/dL — ABNORMAL HIGH (ref 70–99)
Potassium: 3.9 mmol/L (ref 3.5–5.1)
Sodium: 138 mmol/L (ref 135–145)

## 2021-04-29 LAB — MAGNESIUM: Magnesium: 2 mg/dL (ref 1.7–2.4)

## 2021-04-29 LAB — ECHOCARDIOGRAM COMPLETE
AR max vel: 0.81 cm2
AV Area VTI: 0.84 cm2
AV Area mean vel: 0.81 cm2
AV Mean grad: 40.2 mmHg
AV Peak grad: 64 mmHg
Ao pk vel: 4 m/s
Area-P 1/2: 2.81 cm2
Height: 69 in
MV VTI: 2.62 cm2
S' Lateral: 2.5 cm
Weight: 2612.8 oz

## 2021-04-29 LAB — CBC
HCT: 44.7 % (ref 36.0–46.0)
Hemoglobin: 15.1 g/dL — ABNORMAL HIGH (ref 12.0–15.0)
MCH: 32.5 pg (ref 26.0–34.0)
MCHC: 33.8 g/dL (ref 30.0–36.0)
MCV: 96.3 fL (ref 80.0–100.0)
Platelets: 140 10*3/uL — ABNORMAL LOW (ref 150–400)
RBC: 4.64 MIL/uL (ref 3.87–5.11)
RDW: 12.5 % (ref 11.5–15.5)
WBC: 5.8 10*3/uL (ref 4.0–10.5)
nRBC: 0 % (ref 0.0–0.2)

## 2021-04-29 LAB — TROPONIN I (HIGH SENSITIVITY)
Troponin I (High Sensitivity): 3 ng/L (ref <18)
Troponin I (High Sensitivity): 3 ng/L (ref <18)

## 2021-04-29 LAB — BRAIN NATRIURETIC PEPTIDE: B Natriuretic Peptide: 37 pg/mL (ref 0.0–100.0)

## 2021-04-29 LAB — RESP PANEL BY RT-PCR (FLU A&B, COVID) ARPGX2
Influenza A by PCR: NEGATIVE
Influenza B by PCR: NEGATIVE
SARS Coronavirus 2 by RT PCR: NEGATIVE

## 2021-04-29 LAB — GLUCOSE, CAPILLARY
Glucose-Capillary: 134 mg/dL — ABNORMAL HIGH (ref 70–99)
Glucose-Capillary: 120 mg/dL — ABNORMAL HIGH (ref 70–99)
Glucose-Capillary: 127 mg/dL — ABNORMAL HIGH (ref 70–99)

## 2021-04-29 LAB — HIV ANTIBODY (ROUTINE TESTING W REFLEX): HIV Screen 4th Generation wRfx: NONREACTIVE

## 2021-04-29 LAB — D-DIMER, QUANTITATIVE: D-Dimer, Quant: 2.47 ug/mL-FEU — ABNORMAL HIGH (ref 0.00–0.50)

## 2021-04-29 MED ORDER — ACETAMINOPHEN 650 MG RE SUPP: 650.0000 mg | Freq: Four times a day (QID) | RECTAL | Status: DC | PRN

## 2021-04-29 MED ORDER — INSULIN ASPART 100 UNIT/ML IJ SOLN
0.0000 [IU] | Freq: Three times a day (TID) | INTRAMUSCULAR | Status: DC
  Administered 2021-04-29 – 2021-05-01 (×5): 1 [IU] via SUBCUTANEOUS

## 2021-04-29 MED ORDER — PREDNISONE 50 MG PO TABS
60.0000 mg | ORAL_TABLET | Freq: Once | ORAL | Status: AC
  Administered 2021-04-29: 60 mg via ORAL
  Filled 2021-04-29: qty 1

## 2021-04-29 MED ORDER — INSULIN ASPART 100 UNIT/ML IJ SOLN: 0.0000 [IU] | Freq: Every day | INTRAMUSCULAR | Status: DC

## 2021-04-29 MED ORDER — PREDNISONE 20 MG PO TABS
40.0000 mg | ORAL_TABLET | Freq: Every day | ORAL | Status: DC
  Administered 2021-04-29: 40 mg via ORAL
  Filled 2021-04-29: qty 2

## 2021-04-29 MED ORDER — METOPROLOL TARTRATE 5 MG/5ML IV SOLN: 5.0000 mg | INTRAVENOUS | Status: DC | PRN

## 2021-04-29 MED ORDER — ACETAMINOPHEN 325 MG PO TABS
650.0000 mg | ORAL_TABLET | Freq: Four times a day (QID) | ORAL | Status: DC | PRN
  Administered 2021-04-29: 650 mg via ORAL
  Filled 2021-04-29: qty 2

## 2021-04-29 MED ORDER — HYDRALAZINE HCL 20 MG/ML IJ SOLN: 10.0000 mg | INTRAMUSCULAR | Status: DC | PRN

## 2021-04-29 MED ORDER — MOMETASONE FURO-FORMOTEROL FUM 200-5 MCG/ACT IN AERO
2.0000 | INHALATION_SPRAY | Freq: Two times a day (BID) | RESPIRATORY_TRACT | Status: DC
  Administered 2021-04-29 – 2021-05-02 (×6): 2 via RESPIRATORY_TRACT
  Filled 2021-04-29: qty 8.8

## 2021-04-29 MED ORDER — ASPIRIN 81 MG PO CHEW
324.0000 mg | CHEWABLE_TABLET | Freq: Once | ORAL | Status: AC
  Administered 2021-04-29: 324 mg via ORAL
  Filled 2021-04-29: qty 4

## 2021-04-29 MED ORDER — ALBUTEROL SULFATE (2.5 MG/3ML) 0.083% IN NEBU: 2.5000 mg | INHALATION_SOLUTION | RESPIRATORY_TRACT | Status: DC | PRN

## 2021-04-29 MED ORDER — METHYLPREDNISOLONE SODIUM SUCC 40 MG IJ SOLR
40.0000 mg | Freq: Three times a day (TID) | INTRAMUSCULAR | Status: DC
  Administered 2021-04-29 – 2021-05-02 (×8): 40 mg via INTRAVENOUS
  Filled 2021-04-29 (×8): qty 1

## 2021-04-29 MED ORDER — MORPHINE SULFATE (PF) 2 MG/ML IV SOLN: 2.0000 mg | INTRAVENOUS | Status: DC | PRN

## 2021-04-29 MED ORDER — SENNOSIDES-DOCUSATE SODIUM 8.6-50 MG PO TABS: 1.0000 | ORAL_TABLET | Freq: Every evening | ORAL | Status: DC | PRN

## 2021-04-29 MED ORDER — AZITHROMYCIN 250 MG PO TABS
500.0000 mg | ORAL_TABLET | Freq: Every day | ORAL | Status: DC
  Administered 2021-04-29 – 2021-05-02 (×3): 500 mg via ORAL
  Filled 2021-04-29 (×3): qty 2

## 2021-04-29 MED ORDER — IPRATROPIUM-ALBUTEROL 0.5-2.5 (3) MG/3ML IN SOLN
3.0000 mL | Freq: Four times a day (QID) | RESPIRATORY_TRACT | Status: DC
  Administered 2021-04-29 (×4): 3 mL via RESPIRATORY_TRACT
  Filled 2021-04-29 (×4): qty 3

## 2021-04-29 MED ORDER — ONDANSETRON HCL 4 MG PO TABS: 4.0000 mg | ORAL_TABLET | Freq: Four times a day (QID) | ORAL | Status: DC | PRN

## 2021-04-29 MED ORDER — HYDROCODONE-ACETAMINOPHEN 5-325 MG PO TABS
1.0000 | ORAL_TABLET | ORAL | Status: DC | PRN
  Administered 2021-04-29: 1 via ORAL
  Filled 2021-04-29: qty 1

## 2021-04-29 MED ORDER — HEPARIN SODIUM (PORCINE) 5000 UNIT/ML IJ SOLN
5000.0000 [IU] | Freq: Three times a day (TID) | INTRAMUSCULAR | Status: DC
  Administered 2021-04-29 – 2021-04-30 (×3): 5000 [IU] via SUBCUTANEOUS
  Filled 2021-04-29 (×3): qty 1

## 2021-04-29 MED ORDER — IOHEXOL 350 MG/ML SOLN
75.0000 mL | Freq: Once | INTRAVENOUS | Status: AC | PRN
  Administered 2021-04-29: 75 mL via INTRAVENOUS

## 2021-04-29 MED ORDER — ONDANSETRON HCL 4 MG/2ML IJ SOLN: 4.0000 mg | Freq: Four times a day (QID) | INTRAMUSCULAR | Status: DC | PRN

## 2021-04-29 MED ORDER — POTASSIUM CHLORIDE CRYS ER 20 MEQ PO TBCR
40.0000 meq | EXTENDED_RELEASE_TABLET | Freq: Once | ORAL | Status: AC
  Administered 2021-04-29: 40 meq via ORAL
  Filled 2021-04-29: qty 2

## 2021-04-29 MED ORDER — TRAZODONE HCL 50 MG PO TABS: 50.0000 mg | ORAL_TABLET | Freq: Every evening | ORAL | Status: DC | PRN

## 2021-04-29 NOTE — Progress Notes (Addendum)
PROGRESS NOTE    Crystal Silva  XIP:[id number] DOB: 1960/10/01 DOA: 04/28/2021 PCP: Patient, No Pcp Per (Inactive)   Brief Narrative:  60 year old female known past medical history, tobacco user-1 pack daily comes to the hospital with dyspnea on exertion which has progressed over the last month.  She was admitted to the hospital for COPD exacerbation.  Echocardiogram ordered due to new diagnosis of systolic murmur.  CTA chest was negative for pulmonary embolism, showed moderate centrilobular emphysema.  There was also 7 mm noncalcified pulmonary nodule in the right upper lobe and was recommended repeat CT scan in 6 to 12 months.   Assessment & Plan:   Active Problems:   Chest pain   Tobacco use disorder   Elevated d-dimer   COPD (chronic obstructive pulmonary disease) (HCC)   Aortic valve disorder  Acute respiratory distress secondary to COPD exacerbation Tobacco use - Still having diffuse diminished breath sounds. - IV steroids, bronchodilators scheduled and as needed - I-S/flutter - Azithromycin day 1 of 5 -Counseled to quit using tobacco.  Nicotine patch as needed  New systolic murmur - Echocardiogram ordered  Pulmonary nodule, 7 mm noncalcified - We will need repeat CT scan in 6-12 months with outpatient PCP     DVT prophylaxis: heparin injection 5,000 Units Start: 04/29/21 0600 SCDs Start: 04/29/21 0405  Code Status: Full code Family Communication:     Dispo: The patient is from: Home              Anticipated d/c is to: Home              Patient currently is not medically stable to d/c.   Difficult to place patient No       Nutritional status           Body mass index is 24.12 kg/m.           Subjective: Still having exertional dyspnea with minimal movement even in the bed.  Review of Systems Otherwise negative except as per HPI, including: General: Denies fever, chills, night sweats or unintended weight loss. Resp: Denies  hemoptysis Cardiac: Denies chest pain, palpitations, orthopnea, paroxysmal nocturnal dyspnea. GI: Denies abdominal pain, nausea, vomiting, diarrhea or constipation GU: Denies dysuria, frequency, hesitancy or incontinence MS: Denies muscle aches, joint pain or swelling Neuro: Denies headache, neurologic deficits (focal weakness, numbness, tingling), abnormal gait Psych: Denies anxiety, depression, SI/HI/AVH Skin: Denies new rashes or lesions ID: Denies sick contacts, exotic exposures, travel  Examination:  General exam: Appears calm and comfortable  Respiratory system: Bilateral diffuse diminished breath sounds Cardiovascular system: + systolic murmurs 4/6 in left 2nd and 4th IC space S1 & S2 heard, RRR. No JVD,  rubs, gallops or clicks. No pedal edema. Gastrointestinal system: Abdomen is nondistended, soft and nontender. No organomegaly or masses felt. Normal bowel sounds heard. Central nervous system: Alert and oriented. No focal neurological deficits. Extremities: Symmetric 5 x 5 power. Skin: No rashes, lesions or ulcers Psychiatry: Judgement and insight appear normal. Mood & affect appropriate.     Objective: Vitals:   04/29/21 0344 04/29/21 0434 04/29/21 0500 04/29/21 0720  BP:  137/73    Pulse:  81    Resp:  18    Temp:  97.7 F (36.5 C)    TempSrc:  Oral    SpO2: 100% 94%  96%  Weight:   74.1 kg   Height:        Intake/Output Summary (Last 24 hours) at 04/29/2021 1115 Last data filed  at 04/29/2021 0900 Gross per 24 hour  Intake 240 ml  Output --  Net 240 ml   Filed Weights   04/28/21 2201 04/29/21 0500  Weight: 72.6 kg 74.1 kg     Data Reviewed:   CBC: Recent Labs  Lab 04/29/21 0007 04/29/21 0505  WBC 6.8 5.8  NEUTROABS 3.6  --   HGB 16.2* 15.1*  HCT 49.2* 44.7  MCV 99.2 96.3  PLT 154 140*   Basic Metabolic Panel: Recent Labs  Lab 04/29/21 0007 04/29/21 0505  NA 138 139  K 3.9 3.5  CL 106 106  CO2 28 26  GLUCOSE 100* 152*  BUN 15 14   CREATININE 0.71 0.56  CALCIUM 8.3* 8.5*  MG  --  2.0   GFR: Estimated Creatinine Clearance: 80.1 mL/min (by C-G formula based on SCr of 0.56 mg/dL). Liver Function Tests: Recent Labs  Lab 04/29/21 0505  AST 12*  ALT 12  ALKPHOS 44  BILITOT 0.7  PROT 6.3*  ALBUMIN 3.7   No results for input(s): LIPASE, AMYLASE in the last 168 hours. No results for input(s): AMMONIA in the last 168 hours. Coagulation Profile: No results for input(s): INR, PROTIME in the last 168 hours. Cardiac Enzymes: No results for input(s): CKTOTAL, CKMB, CKMBINDEX, TROPONINI in the last 168 hours. BNP (last 3 results) No results for input(s): PROBNP in the last 8760 hours. HbA1C: No results for input(s): HGBA1C in the last 72 hours. CBG: No results for input(s): GLUCAP in the last 168 hours. Lipid Profile: No results for input(s): CHOL, HDL, LDLCALC, TRIG, CHOLHDL, LDLDIRECT in the last 72 hours. Thyroid Function Tests: No results for input(s): TSH, T4TOTAL, FREET4, T3FREE, THYROIDAB in the last 72 hours. Anemia Panel: No results for input(s): VITAMINB12, FOLATE, FERRITIN, TIBC, IRON, RETICCTPCT in the last 72 hours. Sepsis Labs: No results for input(s): PROCALCITON, LATICACIDVEN in the last 168 hours.  Recent Results (from the past 240 hour(s))  Resp Panel by RT-PCR (Flu A&B, Covid) Nasopharyngeal Swab     Status: None   Collection Time: 04/29/21 12:05 AM   Specimen: Nasopharyngeal Swab; Nasopharyngeal(NP) swabs in vial transport medium  Result Value Ref Range Status   SARS Coronavirus 2 by RT PCR NEGATIVE NEGATIVE Final    Comment: (NOTE) SARS-CoV-2 target nucleic acids are NOT DETECTED.  The SARS-CoV-2 RNA is generally detectable in upper respiratory specimens during the acute phase of infection. The lowest concentration of SARS-CoV-2 viral copies this assay can detect is 138 copies/mL. A negative result does not preclude SARS-Cov-2 infection and should not be used as the sole basis for  treatment or other patient management decisions. A negative result may occur with  improper specimen collection/handling, submission of specimen other than nasopharyngeal swab, presence of viral mutation(s) within the areas targeted by this assay, and inadequate number of viral copies(<138 copies/mL). A negative result must be combined with clinical observations, patient history, and epidemiological information. The expected result is Negative.  Fact Sheet for Patients:  BloggerCourse.com  Fact Sheet for Healthcare Providers:  SeriousBroker.it  This test is no t yet approved or cleared by the [place] FDA and  has been authorized for detection and/or diagnosis of SARS-CoV-2 by FDA under an Emergency Use Authorization (EUA). This EUA will remain  in effect (meaning this test can be used) for the duration of the COVID-19 declaration under Section 564(b)(1) of the Act, 21 U.S.C.section 360bbb-3(b)(1), unless the authorization is terminated  or revoked sooner.       Influenza A  by PCR NEGATIVE NEGATIVE Final   Influenza B by PCR NEGATIVE NEGATIVE Final    Comment: (NOTE) The Xpert Xpress SARS-CoV-2/FLU/RSV plus assay is intended as an aid in the diagnosis of influenza from Nasopharyngeal swab specimens and should not be used as a sole basis for treatment. Nasal washings and aspirates are unacceptable for Xpert Xpress SARS-CoV-2/FLU/RSV testing.  Fact Sheet for Patients: BloggerCourse.com  Fact Sheet for Healthcare Providers: SeriousBroker.it  This test is not yet approved or cleared by the [place] FDA and has been authorized for detection and/or diagnosis of SARS-CoV-2 by FDA under an Emergency Use Authorization (EUA). This EUA will remain in effect (meaning this test can be used) for the duration of the COVID-19 declaration under Section 564(b)(1) of the Act, 21  U.S.C. section 360bbb-3(b)(1), unless the authorization is terminated or revoked.  Performed at [facility], [address], [place], [place] [address]          Radiology Studies: DG Chest 2 View  Result Date: 04/28/2021 CLINICAL DATA:  Shortness of breath EXAM: CHEST - 2 VIEW COMPARISON:  04/06/2020 FINDINGS: Hyperinflated lungs. The heart size and mediastinal contours are within normal limits. No focal pulmonary opacity. No pleural effusion or pneumothorax. No acute osseous abnormality. Right upper quadrant surgical clips. IMPRESSION: No acute cardiopulmonary disease. Electronically Signed   By: Wiliam Ke M.D.   On: 04/28/2021 22:27   CT Angio Chest PE W and/or Wo Contrast  Result Date: 04/29/2021 CLINICAL DATA:  PE suspected, low/intermediate prob, positive D-dimer. Dyspnea. EXAM: CT ANGIOGRAPHY CHEST WITH CONTRAST TECHNIQUE: Multidetector CT imaging of the chest was performed using the standard protocol during bolus administration of intravenous contrast. Multiplanar CT image reconstructions and MIPs were obtained to evaluate the vascular anatomy. CONTRAST:  71mL OMNIPAQUE IOHEXOL 350 MG/ML SOLN COMPARISON:  None. FINDINGS: Cardiovascular: There is adequate opacification of the pulmonary arterial tree. No intraluminal filling defect identified to suggest acute pulmonary embolism. The central pulmonary arteries are of normal caliber. There is calcification of the aortic valve leaflets noted. Global cardiac size within normal limits. No significant coronary artery calcification. No pericardial effusion. Mild atherosclerotic calcification noted within the thoracic aorta. No aortic aneurysm. Mediastinum/Nodes: No enlarged mediastinal, hilar, or axillary lymph nodes. Thyroid gland and esophagus demonstrate no significant findings. Lungs/Pleura: Moderate centrilobular emphysema. The lungs appear mildly hyperinflated. Mild diffuse bronchial wall thickening is in keeping with airway inflammation.  Mean 7 mm noncalcified pulmonary nodule is seen within the right upper lobe, axial image # 67/6, indeterminate. No focal pulmonary infiltrate. No pneumothorax or pleural effusion. No central obstructing mass. Layering debris is seen within the distal trachea. Upper Abdomen: Limited images of the upper abdomen demonstrates severe progressive atrophy of the right hepatic lobe and compensatory hypertrophy of the left hepatic lobe when compared to prior examination of 05/02/2009. Surgical clips are seen within the hepatic hilum, of unclear significance. Stable intrahepatic biliary ductal dilation within the atrophic right hepatic lobe. No acute abnormality. Musculoskeletal: Degenerative changes are seen within the thoracic spine. No lytic or blastic bone lesions are seen. Remote T12 superior endplate fracture. Review of the MIP images confirms the above findings. IMPRESSION: No pulmonary embolism. Moderate centrilobular emphysema. Mild bronchial wall thickening in keeping with chronic airway inflammation. 7 mm noncalcified indeterminate pulmonary nodule within the right upper lobe. Non-contrast chest CT at 6-12 months is recommended. If the nodule is stable at time of repeat CT, then future CT at 18-24 months (from today's scan) is considered optional for low-risk patients,  but is recommended for high-risk patients. This recommendation follows the consensus statement: Guidelines for Management of Incidental Pulmonary Nodules Detected on CT Images: From the Fleischner Society 2017; Radiology 2017; 284:228-243. Aortic valve calcification. Echocardiography may be helpful to assess the degree of valvular dysfunction. Progressive marked atrophy of the right hepatic lobe, likely related to chronic biliary obstruction, incompletely evaluated on this examination. This was better assessed on CT examination of 05/02/2009. Emphysema (ICD10-J43.9). Electronically Signed   By: Helyn Numbers M.D.   On: 04/29/2021 01:26         Scheduled Meds:  heparin  5,000 Units Subcutaneous Q8H   insulin aspart  0-5 Units Subcutaneous QHS   insulin aspart  0-9 Units Subcutaneous TID WC   ipratropium-albuterol  3 mL Nebulization Q6H   methylPREDNISolone (SOLU-MEDROL) injection  40 mg Intravenous Q8H   mometasone-formoterol  2 puff Inhalation BID   Continuous Infusions:   LOS: 0 days   Time spent= 35 mins    Joandry Slagter Joline Maxcy, MD [facility]  If 7PM-7AM, please contact night-coverage  04/29/2021, 11:15 AM

## 2021-04-29 NOTE — Consult Note (Addendum)
Cardiology Consultation:   Patient ID: SHARDAY MICHL MRN: [id number]; DOB: 1961/06/21  Admit date: 04/28/2021 Date of Consult: 04/29/2021  PCP:  Patient, No Pcp Per (Inactive)   [facility] Providers Cardiologist:  None        Patient Profile:   Crystal Silva is a 60 y.o. female with a hx of tobacco abuse who is being seen 04/29/2021 for the evaluation of DOE at the request of Dr. Nelson Chimes.  History of Present Illness:   Crystal Silva is a 60 yo female with not significant PMHx. She smokes 1 ppd and is sedentary. She complains of several month history of worsening DOE and heart racing with activity. Walking up stairs at football game Fri night caused significant dyspnea , heart racing and ankle edema. Yest just changing trash bag caused dyspnea. Denies any chest pain.   History reviewed. No pertinent past medical history.  Past Surgical History:  Procedure Laterality Date   CHOLECYSTECTOMY     lt knee surgery       Home Medications:  Prior to Admission medications   Medication Sig Start Date End Date Taking? Authorizing Provider  azithromycin (ZITHROMAX) 250 MG tablet Take 1 tablet (250 mg total) by mouth daily. Take first 2 tablets together, then 1 every day until finished. Patient not taking: No sig reported 02/01/14   Raeford Razor, MD  desloratadine (CLARINEX) 5 MG tablet Take 1 tablet (5 mg total) by mouth daily as needed. Patient not taking: No sig reported 02/01/14   Raeford Razor, MD  HYDROcodone-acetaminophen (NORCO/VICODIN) 5-325 MG per tablet Take 1 tablet by mouth every 4 (four) hours as needed. Patient not taking: No sig reported 02/03/13   Janne Napoleon, NP  ibuprofen (ADVIL,MOTRIN) 600 MG tablet Take 1 tablet (600 mg total) by mouth every 6 (six) hours as needed for pain. Patient not taking: No sig reported 02/03/13   Kerrie Buffalo M, NP  predniSONE (STERAPRED UNI-PAK 21 TAB) 10 MG (21) TBPK tablet Take 6 tabs by mouth daily  for 1 days, then 5 tabs for 1 days, then 4  tabs for 1 days, then 3 tabs for 1 days, 2 tabs for 1 days, then 1 tab by mouth daily for 1 days Patient not taking: No sig reported 04/06/20   Durward Parcel, FNP    Inpatient Medications: Scheduled Meds:  heparin  5,000 Units Subcutaneous Q8H   ipratropium-albuterol  3 mL Nebulization Q6H   mometasone-formoterol  2 puff Inhalation BID   potassium chloride  40 mEq Oral Once   predniSONE  40 mg Oral Q breakfast   Continuous Infusions:  PRN Meds: acetaminophen **OR** acetaminophen, albuterol, hydrALAZINE, HYDROcodone-acetaminophen, metoprolol tartrate, morphine injection, ondansetron **OR** ondansetron (ZOFRAN) IV, senna-docusate, traZODone  Allergies:   No Known Allergies  Social History:   Social History   Socioeconomic History   Marital status: Single    Spouse name: Not on file   Number of children: Not on file   Years of education: Not on file   Highest education level: Not on file  Occupational History   Not on file  Tobacco Use   Smoking status: Every Day    Packs/day: 1.00    Types: Cigarettes   Smokeless tobacco: Never  Substance and Sexual Activity   Alcohol use: No   Drug use: No   Sexual activity: Not on file  Other Topics Concern   Not on file  Social History Narrative   Lives with daughter and grandson   Social  Determinants of Health   Financial Resource Strain: Not on file  Food Insecurity: Not on file  Transportation Needs: Not on file  Physical Activity: Not on file  Stress: Not on file  Social Connections: Not on file  Intimate Partner Violence: Not on file    Family History:     Family History  Problem Relation Age of Onset   Heart failure Mother    Heart disease Mother        stent     ROS:  Please see the history of present illness.  Review of Systems  Constitutional: Negative.  HENT: Negative.    Eyes: Negative.   Cardiovascular:  Positive for dyspnea on exertion, leg swelling and palpitations.  Respiratory: Negative.     Hematologic/Lymphatic: Negative.   Musculoskeletal: Negative.  Negative for joint pain.  Gastrointestinal: Negative.   Genitourinary: Negative.   Neurological: Negative.    All other ROS reviewed and negative.     Physical Exam/Data:   Vitals:   04/29/21 0344 04/29/21 0434 04/29/21 0500 04/29/21 0720  BP:  137/73    Pulse:  81    Resp:  18    Temp:  97.7 F (36.5 C)    TempSrc:  Oral    SpO2: 100% 94%  96%  Weight:   74.1 kg   Height:       No intake or output data in the 24 hours ending 04/29/21 0857 Last 3 Weights 04/29/2021 04/28/2021 04/06/2020  Weight (lbs) 163 lb 4.8 oz 160 lb 155 lb  Weight (kg) 74.072 kg 72.576 kg 70.308 kg     Body mass index is 24.12 kg/m.  General:  Thin, in no acute distress  HEENT: normal Neck: no JVD Vascular: No carotid bruits; Distal pulses 2+ bilaterally Cardiac:  normal S1, decreased S2; RRR; 3/6 systolic murmur LSB and throughout Lungs:  clear to auscultation bilaterally, no wheezing, rhonchi or rales  Abd: soft, nontender, no hepatomegaly  Ext: no edema Musculoskeletal:  No deformities, BUE and BLE strength normal and equal Skin: warm and dry  Neuro:  CNs 2-12 intact, no focal abnormalities noted Psych:  Normal affect   EKG:  The EKG was personally reviewed and demonstrates:  NSR with poor R wave progression ant, no old EKG to compare Telemetry:  Telemetry was personally reviewed and demonstrates:  NSR  Relevant CV Studies:    Laboratory Data:  High Sensitivity Troponin:   Recent Labs  Lab 04/29/21 0007 04/29/21 0229  TROPONINIHS 3 3     Chemistry Recent Labs  Lab 04/29/21 0007 04/29/21 0505  NA 138 139  K 3.9 3.5  CL 106 106  CO2 28 26  GLUCOSE 100* 152*  BUN 15 14  CREATININE 0.71 0.56  CALCIUM 8.3* 8.5*  MG  --  2.0  GFRNONAA >60 >60  ANIONGAP 4* 7    Recent Labs  Lab 04/29/21 0505  PROT 6.3*  ALBUMIN 3.7  AST 12*  ALT 12  ALKPHOS 44  BILITOT 0.7   Lipids No results for input(s): CHOL, TRIG,  HDL, LABVLDL, LDLCALC, CHOLHDL in the last 168 hours.  Hematology Recent Labs  Lab 04/29/21 0007 04/29/21 0505  WBC 6.8 5.8  RBC 4.96 4.64  HGB 16.2* 15.1*  HCT 49.2* 44.7  MCV 99.2 96.3  MCH 32.7 32.5  MCHC 32.9 33.8  RDW 12.6 12.5  PLT 154 140*   Thyroid No results for input(s): TSH, FREET4 in the last 168 hours.  BNP Recent Labs  Lab  04/29/21 0007  BNP 37.0    DDimer  Recent Labs  Lab 04/29/21 0007  DDIMER 2.47*     Radiology/Studies:  DG Chest 2 View  Result Date: 04/28/2021 CLINICAL DATA:  Shortness of breath EXAM: CHEST - 2 VIEW COMPARISON:  04/06/2020 FINDINGS: Hyperinflated lungs. The heart size and mediastinal contours are within normal limits. No focal pulmonary opacity. No pleural effusion or pneumothorax. No acute osseous abnormality. Right upper quadrant surgical clips. IMPRESSION: No acute cardiopulmonary disease. Electronically Signed   By: Wiliam Ke M.D.   On: 04/28/2021 22:27   CT Angio Chest PE W and/or Wo Contrast  Result Date: 04/29/2021 CLINICAL DATA:  PE suspected, low/intermediate prob, positive D-dimer. Dyspnea. EXAM: CT ANGIOGRAPHY CHEST WITH CONTRAST TECHNIQUE: Multidetector CT imaging of the chest was performed using the standard protocol during bolus administration of intravenous contrast. Multiplanar CT image reconstructions and MIPs were obtained to evaluate the vascular anatomy. CONTRAST:  71mL OMNIPAQUE IOHEXOL 350 MG/ML SOLN COMPARISON:  None. FINDINGS: Cardiovascular: There is adequate opacification of the pulmonary arterial tree. No intraluminal filling defect identified to suggest acute pulmonary embolism. The central pulmonary arteries are of normal caliber. There is calcification of the aortic valve leaflets noted. Global cardiac size within normal limits. No significant coronary artery calcification. No pericardial effusion. Mild atherosclerotic calcification noted within the thoracic aorta. No aortic aneurysm. Mediastinum/Nodes: No  enlarged mediastinal, hilar, or axillary lymph nodes. Thyroid gland and esophagus demonstrate no significant findings. Lungs/Pleura: Moderate centrilobular emphysema. The lungs appear mildly hyperinflated. Mild diffuse bronchial wall thickening is in keeping with airway inflammation. Mean 7 mm noncalcified pulmonary nodule is seen within the right upper lobe, axial image # 67/6, indeterminate. No focal pulmonary infiltrate. No pneumothorax or pleural effusion. No central obstructing mass. Layering debris is seen within the distal trachea. Upper Abdomen: Limited images of the upper abdomen demonstrates severe progressive atrophy of the right hepatic lobe and compensatory hypertrophy of the left hepatic lobe when compared to prior examination of 05/02/2009. Surgical clips are seen within the hepatic hilum, of unclear significance. Stable intrahepatic biliary ductal dilation within the atrophic right hepatic lobe. No acute abnormality. Musculoskeletal: Degenerative changes are seen within the thoracic spine. No lytic or blastic bone lesions are seen. Remote T12 superior endplate fracture. Review of the MIP images confirms the above findings. IMPRESSION: No pulmonary embolism. Moderate centrilobular emphysema. Mild bronchial wall thickening in keeping with chronic airway inflammation. 7 mm noncalcified indeterminate pulmonary nodule within the right upper lobe. Non-contrast chest CT at 6-12 months is recommended. If the nodule is stable at time of repeat CT, then future CT at 18-24 months (from today's scan) is considered optional for low-risk patients, but is recommended for high-risk patients. This recommendation follows the consensus statement: Guidelines for Management of Incidental Pulmonary Nodules Detected on CT Images: From the Fleischner Society 2017; Radiology 2017; 284:228-243. Aortic valve calcification. Echocardiography may be helpful to assess the degree of valvular dysfunction. Progressive marked atrophy  of the right hepatic lobe, likely related to chronic biliary obstruction, incompletely evaluated on this examination. This was better assessed on CT examination of 05/02/2009. Emphysema (ICD10-J43.9). Electronically Signed   By: Helyn Numbers M.D.   On: 04/29/2021 01:26     Assessment and Plan:   Acute on chronic respiratory failure with hypoxia felt secondary to COPD exacerbation treated with steroids, O2, albuterol. CT negative PE  Dyspnea on exertion for several months progressively worsened-no chest pain. Systolic murmur suspect AS-for echo this am. CT shows calcification Ao  valve, none in coronary arteries. No evidence of CHF  Palpitations-heart racing with little activity-will need outpatient ZIO-no arrhthymias on tele but hasn't been up moving.  Tobacco abuse-smokes 1 PPD-7 mm pulmonary nodule RUL   Family history of CAD-mother with stent/CHF   Risk Assessment/Risk Scores:        New York Heart Association (NYHA) Functional Class NYHA Class III        For questions or updates, please contact [facility] Please consult [email or web address] for contact info under    Signed, Jacolyn Reedy, PA-C  04/29/2021 8:57 AM  Attending note Patient seen and discussed with PA Geni Bers, I agree with her documentaiton. 60 yo female history of tobacco abuse presents with dyspnea on exertion and palpitations. She report DOE over the last year that has progressed. Also reports some feelins of heart racing only with exertion, never at rest. On my history denies any exertional chest pain or tightness. She does report some recent LE edema at times.    WBC 6.8 Hgb 16.2 Plt 154 K 3.9 Cr 0.71 BUN 15 Ddimer 2.47 BNP 37 Trop 3-->3 COVID neg CXR no acute process  CT PE no PE, no significant coronary calcification EKG: SR, artifact, non specific st/t changes Echo pending  Patient presents with progressing DOE of unclear etiology. With murmur on exam and aortic calcification on CT agree with echo to  evaluate for any signficiant valvular or cardiac dysfunction. No evidence of ACS or clinical decompnesated heart failure. If benign echo findings could consider ischemic testing.   Dina Rich MD

## 2021-04-29 NOTE — Plan of Care (Signed)

## 2021-04-29 NOTE — Progress Notes (Signed)
Echo shows severe aortic stenosis. Given her severe DOE would look to proceed with inpatient workup to try to expedite her AVR consideration. Tomorrow we will discuss with patient left and right heart cath along with structural heart team evaluation at [facility], make npo tonight.    Dina Rich MD

## 2021-04-29 NOTE — Progress Notes (Signed)
*  PRELIMINARY RESULTS* Echocardiogram 2D Echocardiogram has been performed.  Carolyne Fiscal 04/29/2021, 11:41 AM

## 2021-04-29 NOTE — H&P (Signed)
TRH H&P    Patient Demographics:    Crystal Silva, is a 60 y.o. female  MRN: [id number]  DOB - [birth year]  Admit Date - 04/28/2021  Referring MD/NP/PA: Rancour  Outpatient Primary MD for the patient is Patient, No Pcp Per (Inactive)  Patient coming from: Home  Chief complaint- Dyspnea   HPI:    Crystal Silva  is a 60 y.o. female, with history of tobacco use disorder, and no other known history as she does not normally follow with a doctor, presents the ED with a chief complaint of dyspnea.  Patient reports that the dyspnea has been going on for some time intermittently.  Its gotten worse and more frequent over the last week.  She reports any exertion at all has her helping and her heart pounding.  Patient reports that her dyspnea is definitely worse with exertion, better with rest.  It takes 2 to 3 minutes to recover when she is able to rest.  Her palpitations last a little longer than the 2 to 3 minutes, but they eventually is resolved as well with rest.  She does not have any chest pain, but she does feel chest tightness.  She reports no nausea, vomiting, dizziness, diaphoresis.  She did admit to some peripheral edema, that is since resolved.  She has never had a heart attack.  She does not have a diagnosis of COPD although she clearly has barrel chest.  She has inhalers at home, she tried her rescue inhaler and it helped only minimally.  Patient does not wear oxygen at baseline.  She has not been on steroids recently.  Patient has no other complaints at this time.  Patient reports she does smoke, but does not want a nicotine patch at this time.  She has thought about quitting, but has not been able to so far.  She does not drink alcohol or use illicit drugs.  She is not vaccinated for COVID.  Patient is full code.  In the ED Temp 97.7, heart rate 79, respiratory rate 13-22, blood pressure 125/73, satting 96% No  leukocytosis with a white blood cell count of 6.8, hemoglobin 16.2, platelets 154 Chemistry panel is unremarkable, BNP is 37 Troponin is 3, and 3 Negative COVID CTA shows no PE, central lobar emphysema, pulmonary nodule in the right upper lobe, aortic valve calcification EKG shows a heart rate of 83, sinus rhythm, QTC 450 Albuterol and prednisone given in the ED Admission requested for COPD versus angina     Review of systems:    In addition to the HPI above,  No Fever-chills, No Headache, No changes with Vision or hearing, No problems swallowing food or Liquids, No Abdominal pain, No Nausea or Vomiting, bowel movements are regular, No Blood in stool or Urine, No dysuria, No new skin rashes or bruises, No new joints pains-aches,  No new weakness, tingling, numbness in any extremity, No recent weight gain or loss, No polyuria, polydypsia or polyphagia, No significant Mental Stressors.  All other systems reviewed and are negative.    Past History  of the following :    History reviewed. No pertinent past medical history.    Past Surgical History:  Procedure Laterality Date   CHOLECYSTECTOMY     lt knee surgery        Social History:      Social History   Tobacco Use   Smoking status: Every Day    Packs/day: 1.00    Types: Cigarettes   Smokeless tobacco: Never  Substance Use Topics   Alcohol use: No       Family History :    History reviewed. No pertinent family history. Family history of hypertension   Home Medications:   Prior to Admission medications   Medication Sig Start Date End Date Taking? Authorizing Provider  azithromycin (ZITHROMAX) 250 MG tablet Take 1 tablet (250 mg total) by mouth daily. Take first 2 tablets together, then 1 every day until finished. 02/01/14   Raeford Razor, MD  cetirizine (ZYRTEC) 10 MG tablet Take 10 mg by mouth daily.    [provider]  desloratadine (CLARINEX) 5 MG tablet Take 1 tablet (5 mg total) by  mouth daily as needed. 02/01/14   Raeford Razor, MD  HYDROcodone-acetaminophen (NORCO/VICODIN) 5-325 MG per tablet Take 1 tablet by mouth every 4 (four) hours as needed. 02/03/13   Janne Napoleon, NP  ibuprofen (ADVIL,MOTRIN) 600 MG tablet Take 1 tablet (600 mg total) by mouth every 6 (six) hours as needed for pain. 02/03/13   Janne Napoleon, NP  predniSONE (STERAPRED UNI-PAK 21 TAB) 10 MG (21) TBPK tablet Take 6 tabs by mouth daily  for 1 days, then 5 tabs for 1 days, then 4 tabs for 1 days, then 3 tabs for 1 days, 2 tabs for 1 days, then 1 tab by mouth daily for 1 days 04/06/20   Durward Parcel, FNP     Allergies:    No Known Allergies   Physical Exam:   Vitals  Blood pressure 137/73, pulse 81, temperature 97.7 F (36.5 C), temperature source Oral, resp. rate 18, height 5\' 9"  (1.753 m), weight 72.6 kg, last menstrual period 12/02/2013, SpO2 94 %. 1.  General: Patient lying supine in bed, head of bed elevated, no acute distress   2. Psychiatric: Alert and oriented x 3, mood and behavior normal for situation, pleasant and cooperative with exam   3. Neurologic: Speech and language are normal, face is symmetric, moves all 4 extremities voluntarily, at baseline without acute deficits on limited exam   4. HEENMT:  Head is atraumatic, normocephalic, pupils reactive to light, neck is supple, trachea is midline, mucous membranes are moist   5. Respiratory : Barrel chest, lungs are with wheezing bilaterally, no, rhonchi, rales, no cyanosis, no increase in work of breathing or accessory muscle use   6. Cardiovascular : Heart rate normal, rhythm is regular, systolic murmur present, rubs or gallops, no peripheral edema, peripheral pulses palpated   7. Gastrointestinal:  Abdomen is soft, nondistended, nontender to palpation bowel sounds active, no masses or organomegaly palpated   8. Skin:  Skin is warm, dry and intact without rashes, acute lesions, or ulcers on limited exam    9.Musculoskeletal:  No acute deformities or trauma, no asymmetry in tone, no peripheral edema, peripheral pulses palpated, no tenderness to palpation in the extremities     Data Review:    CBC Recent Labs  Lab 04/29/21 0007  WBC 6.8  HGB 16.2*  HCT 49.2*  PLT 154  MCV 99.2  MCH 32.7  MCHC 32.9  RDW 12.6  LYMPHSABS 2.6  MONOABS 0.4  EOSABS 0.2  BASOSABS 0.1   ------------------------------------------------------------------------------------------------------------------  Results for orders placed or performed during the hospital encounter of 04/28/21 (from the past 48 hour(s))  Resp Panel by RT-PCR (Flu A&B, Covid) Nasopharyngeal Swab     Status: None   Collection Time: 04/29/21 12:05 AM   Specimen: Nasopharyngeal Swab; Nasopharyngeal(NP) swabs in vial transport medium  Result Value Ref Range   SARS Coronavirus 2 by RT PCR NEGATIVE NEGATIVE    Comment: (NOTE) SARS-CoV-2 target nucleic acids are NOT DETECTED.  The SARS-CoV-2 RNA is generally detectable in upper respiratory specimens during the acute phase of infection. The lowest concentration of SARS-CoV-2 viral copies this assay can detect is 138 copies/mL. A negative result does not preclude SARS-Cov-2 infection and should not be used as the sole basis for treatment or other patient management decisions. A negative result may occur with  improper specimen collection/handling, submission of specimen other than nasopharyngeal swab, presence of viral mutation(s) within the areas targeted by this assay, and inadequate number of viral copies(<138 copies/mL). A negative result must be combined with clinical observations, patient history, and epidemiological information. The expected result is Negative.  Fact Sheet for Patients:  BloggerCourse.com  Fact Sheet for Healthcare Providers:  SeriousBroker.it  This test is no t yet approved or cleared by the [place]  FDA and  has been authorized for detection and/or diagnosis of SARS-CoV-2 by FDA under an Emergency Use Authorization (EUA). This EUA will remain  in effect (meaning this test can be used) for the duration of the COVID-19 declaration under Section 564(b)(1) of the Act, 21 U.S.C.section 360bbb-3(b)(1), unless the authorization is terminated  or revoked sooner.       Influenza A by PCR NEGATIVE NEGATIVE   Influenza B by PCR NEGATIVE NEGATIVE    Comment: (NOTE) The Xpert Xpress SARS-CoV-2/FLU/RSV plus assay is intended as an aid in the diagnosis of influenza from Nasopharyngeal swab specimens and should not be used as a sole basis for treatment. Nasal washings and aspirates are unacceptable for Xpert Xpress SARS-CoV-2/FLU/RSV testing.  Fact Sheet for Patients: BloggerCourse.com  Fact Sheet for Healthcare Providers: SeriousBroker.it  This test is not yet approved or cleared by the [place] FDA and has been authorized for detection and/or diagnosis of SARS-CoV-2 by FDA under an Emergency Use Authorization (EUA). This EUA will remain in effect (meaning this test can be used) for the duration of the COVID-19 declaration under Section 564(b)(1) of the Act, 21 U.S.C. section 360bbb-3(b)(1), unless the authorization is terminated or revoked.  Performed at [facility], [address], [place], [place] [address]   CBC with Differential/Platelet     Status: Abnormal   Collection Time: 04/29/21 12:07 AM  Result Value Ref Range   WBC 6.8 4.0 - 10.5 K/uL   RBC 4.96 3.87 - 5.11 MIL/uL   Hemoglobin 16.2 (H) 12.0 - 15.0 g/dL   HCT 64.4 (H) 03.4 - 74.2 %   MCV 99.2 80.0 - 100.0 fL   MCH 32.7 26.0 - 34.0 pg   MCHC 32.9 30.0 - 36.0 g/dL   RDW 59.5 63.8 - 75.6 %   Platelets 154 150 - 400 K/uL   nRBC 0.0 0.0 - 0.2 %   Neutrophils Relative % 53 %   Neutro Abs 3.6 1.7 - 7.7 K/uL   Lymphocytes Relative 38 %   Lymphs Abs 2.6 0.7 - 4.0 K/uL    Monocytes Relative 5 %  Monocytes Absolute 0.4 0.1 - 1.0 K/uL   Eosinophils Relative 3 %   Eosinophils Absolute 0.2 0.0 - 0.5 K/uL   Basophils Relative 1 %   Basophils Absolute 0.1 0.0 - 0.1 K/uL   Immature Granulocytes 0 %   Abs Immature Granulocytes 0.01 0.00 - 0.07 K/uL    Comment: Performed at [facility], [address] [place], [place] [address]  Basic metabolic panel     Status: Abnormal   Collection Time: 04/29/21 12:07 AM  Result Value Ref Range   Sodium 138 135 - 145 mmol/L   Potassium 3.9 3.5 - 5.1 mmol/L   Chloride 106 98 - 111 mmol/L   CO2 28 22 - 32 mmol/L   Glucose, Bld 100 (H) 70 - 99 mg/dL    Comment: Glucose reference range applies only to samples taken after fasting for at least 8 hours.   BUN 15 6 - 20 mg/dL   Creatinine, Ser 6.29 0.44 - 1.00 mg/dL   Calcium 8.3 (L) 8.9 - 10.3 mg/dL   GFR, Estimated >52 >84 mL/min    Comment: (NOTE) Calculated using the CKD-EPI Creatinine Equation (2021)    Anion gap 4 (L) 5 - 15    Comment: Performed at [facility], [address], [place], [place] [address]  Troponin I (High Sensitivity)     Status: None   Collection Time: 04/29/21 12:07 AM  Result Value Ref Range   Troponin I (High Sensitivity) 3 <18 ng/L    Comment: (NOTE) Elevated high sensitivity troponin I (hsTnI) values and significant  changes across serial measurements may suggest ACS but many other  chronic and acute conditions are known to elevate hsTnI results.  Refer to the "Links" section for chest pain algorithms and additional  guidance. Performed at [facility], [address], [place], [place] [address]   D-dimer, quantitative     Status: Abnormal   Collection Time: 04/29/21 12:07 AM  Result Value Ref Range   D-Dimer, Quant 2.47 (H) 0.00 - 0.50 ug/mL-FEU    Comment: (NOTE) At the manufacturer cut-off value of 0.5 g/mL FEU, this assay has a negative predictive value of 95-100%.This assay is intended for use in conjunction with a clinical  pretest probability (PTP) assessment model to exclude pulmonary embolism (PE) and deep venous thrombosis (DVT) in outpatients suspected of PE or DVT. Results should be correlated with clinical presentation. Performed at [facility], [address] [place], [place] [address]   Brain natriuretic peptide     Status: None   Collection Time: 04/29/21 12:07 AM  Result Value Ref Range   B Natriuretic Peptide 37.0 0.0 - 100.0 pg/mL    Comment: Performed at [facility], [address], [place], [place] [address]  Troponin I (High Sensitivity)     Status: None   Collection Time: 04/29/21  2:29 AM  Result Value Ref Range   Troponin I (High Sensitivity) 3 <18 ng/L    Comment: (NOTE) Elevated high sensitivity troponin I (hsTnI) values and significant  changes across serial measurements may suggest ACS but many other  chronic and acute conditions are known to elevate hsTnI results.  Refer to the "Links" section for chest pain algorithms and additional  guidance. Performed at [facility], [address] [place], [place] [address]   Blood gas, venous     Status: None   Collection Time: 04/29/21  2:31 AM  Result Value Ref Range   FIO2 21.00    pH, Ven 7.368 7.250 - 7.430  pCO2, Ven 46.9 44.0 - 60.0 mmHg   pO2, Ven 42.2 32.0 - 45.0 mmHg   Bicarbonate 24.7 20.0 - 28.0 mmol/L   Acid-Base Excess 1.6 0.0 - 2.0 mmol/L   O2 Saturation 78.3 %   Patient temperature 36.5     Comment: Performed at [facility], [address] [place], [place] [address]    Chemistries  Recent Labs  Lab 04/29/21 0007  NA 138  K 3.9  CL 106  CO2 28  GLUCOSE 100*  BUN 15  CREATININE 0.71  CALCIUM 8.3*   ------------------------------------------------------------------------------------------------------------------  ------------------------------------------------------------------------------------------------------------------ GFR: Estimated Creatinine Clearance: 80.1 mL/min (by C-G formula based on SCr  of 0.71 mg/dL). Liver Function Tests: No results for input(s): AST, ALT, ALKPHOS, BILITOT, PROT, ALBUMIN in the last 168 hours. No results for input(s): LIPASE, AMYLASE in the last 168 hours. No results for input(s): AMMONIA in the last 168 hours. Coagulation Profile: No results for input(s): INR, PROTIME in the last 168 hours. Cardiac Enzymes: No results for input(s): CKTOTAL, CKMB, CKMBINDEX, TROPONINI in the last 168 hours. BNP (last 3 results) No results for input(s): PROBNP in the last 8760 hours. HbA1C: No results for input(s): HGBA1C in the last 72 hours. CBG: No results for input(s): GLUCAP in the last 168 hours. Lipid Profile: No results for input(s): CHOL, HDL, LDLCALC, TRIG, CHOLHDL, LDLDIRECT in the last 72 hours. Thyroid Function Tests: No results for input(s): TSH, T4TOTAL, FREET4, T3FREE, THYROIDAB in the last 72 hours. Anemia Panel: No results for input(s): VITAMINB12, FOLATE, FERRITIN, TIBC, IRON, RETICCTPCT in the last 72 hours.  --------------------------------------------------------------------------------------------------------------- Urine analysis:    Component Value Date/Time   COLORURINE YELLOW 05/02/2009 1500   APPEARANCEUR CLEAR 05/02/2009 1500   LABSPEC 1.025 05/02/2009 1500   PHURINE 6.0 05/02/2009 1500   GLUCOSEU NEGATIVE 05/02/2009 1500   HGBUR TRACE (A) 05/02/2009 1500   BILIRUBINUR NEGATIVE 05/02/2009 1500   KETONESUR NEGATIVE 05/02/2009 1500   PROTEINUR NEGATIVE 05/02/2009 1500   UROBILINOGEN 0.2 05/02/2009 1500   NITRITE NEGATIVE 05/02/2009 1500   LEUKOCYTESUR NEGATIVE 05/02/2009 1500      Imaging Results:    DG Chest 2 View  Result Date: 04/28/2021 CLINICAL DATA:  Shortness of breath EXAM: CHEST - 2 VIEW COMPARISON:  04/06/2020 FINDINGS: Hyperinflated lungs. The heart size and mediastinal contours are within normal limits. No focal pulmonary opacity. No pleural effusion or pneumothorax. No acute osseous abnormality. Right upper  quadrant surgical clips. IMPRESSION: No acute cardiopulmonary disease. Electronically Signed   By: Wiliam Ke M.D.   On: 04/28/2021 22:27   CT Angio Chest PE W and/or Wo Contrast  Result Date: 04/29/2021 CLINICAL DATA:  PE suspected, low/intermediate prob, positive D-dimer. Dyspnea. EXAM: CT ANGIOGRAPHY CHEST WITH CONTRAST TECHNIQUE: Multidetector CT imaging of the chest was performed using the standard protocol during bolus administration of intravenous contrast. Multiplanar CT image reconstructions and MIPs were obtained to evaluate the vascular anatomy. CONTRAST:  75mL OMNIPAQUE IOHEXOL 350 MG/ML SOLN COMPARISON:  None. FINDINGS: Cardiovascular: There is adequate opacification of the pulmonary arterial tree. No intraluminal filling defect identified to suggest acute pulmonary embolism. The central pulmonary arteries are of normal caliber. There is calcification of the aortic valve leaflets noted. Global cardiac size within normal limits. No significant coronary artery calcification. No pericardial effusion. Mild atherosclerotic calcification noted within the thoracic aorta. No aortic aneurysm. Mediastinum/Nodes: No enlarged mediastinal, hilar, or axillary lymph nodes. Thyroid gland and esophagus demonstrate no significant findings. Lungs/Pleura: Moderate centrilobular emphysema. The lungs appear mildly hyperinflated. Mild diffuse  bronchial wall thickening is in keeping with airway inflammation. Mean 7 mm noncalcified pulmonary nodule is seen within the right upper lobe, axial image # 67/6, indeterminate. No focal pulmonary infiltrate. No pneumothorax or pleural effusion. No central obstructing mass. Layering debris is seen within the distal trachea. Upper Abdomen: Limited images of the upper abdomen demonstrates severe progressive atrophy of the right hepatic lobe and compensatory hypertrophy of the left hepatic lobe when compared to prior examination of 05/02/2009. Surgical clips are seen within the  hepatic hilum, of unclear significance. Stable intrahepatic biliary ductal dilation within the atrophic right hepatic lobe. No acute abnormality. Musculoskeletal: Degenerative changes are seen within the thoracic spine. No lytic or blastic bone lesions are seen. Remote T12 superior endplate fracture. Review of the MIP images confirms the above findings. IMPRESSION: No pulmonary embolism. Moderate centrilobular emphysema. Mild bronchial wall thickening in keeping with chronic airway inflammation. 7 mm noncalcified indeterminate pulmonary nodule within the right upper lobe. Non-contrast chest CT at 6-12 months is recommended. If the nodule is stable at time of repeat CT, then future CT at 18-24 months (from today's scan) is considered optional for low-risk patients, but is recommended for high-risk patients. This recommendation follows the consensus statement: Guidelines for Management of Incidental Pulmonary Nodules Detected on CT Images: From the Fleischner Society 2017; Radiology 2017; 284:228-243. Aortic valve calcification. Echocardiography may be helpful to assess the degree of valvular dysfunction. Progressive marked atrophy of the right hepatic lobe, likely related to chronic biliary obstruction, incompletely evaluated on this examination. This was better assessed on CT examination of 05/02/2009. Emphysema (ICD10-J43.9). Electronically Signed   By: Helyn Numbers M.D.   On: 04/29/2021 01:26       Assessment & Plan:    Active Problems:   Chest pain   Tobacco use disorder   Elevated d-dimer   COPD (chronic obstructive pulmonary disease) (HCC)   Aortic valve disorder   Chest pain Changes on EKG, troponin 3, 3 Aspirin given in the ED Currently chest pain-free Monitor on telemetry Consult cardiology, get echo in the a.m. Continue to monitor Aortic valve disorder Allergic valve calcification with new murmur on exam Echo in the a.m. Consult cardiology Tobacco use disorder Counseled on the  importance of cessation Patient reports understanding but reports that she just has not been able to quit in the past Elevated D-dimer No PE on CTA Acute respiratory failure with hypoxia Requiring 2 L nasal cannula Likely secondary to COPD exacerbation, possibly secondary to cardiac cause Continue work-up as listed under these other problems COPD exacerbation Continue scheduled and as needed nebulizers Continue systemic steroids No indication for antibiotics Continue to monitor   DVT Prophylaxis-   Heparin- SCDs   AM Labs Ordered, also please review Full Orders  Family Communication: No family at bedside Code Status: Full  Admission status: Observation  Time spent in minutes : 65   Shameca Landen B Zierle-Ghosh DO

## 2021-04-30 ENCOUNTER — Encounter (HOSPITAL_COMMUNITY)
Admission: EM | Disposition: A | Discharge status: Home / Self Care | Payer: Self-pay | Source: Home / Self Care | Attending: Internal Medicine

## 2021-04-30 DIAGNOSIS — R0609 Other forms of dyspnea: Secondary | ICD-10-CM

## 2021-04-30 DIAGNOSIS — I359 Nonrheumatic aortic valve disorder, unspecified: Secondary | ICD-10-CM | POA: Diagnosis not present

## 2021-04-30 DIAGNOSIS — I350 Nonrheumatic aortic (valve) stenosis: Secondary | ICD-10-CM | POA: Diagnosis not present

## 2021-04-30 DIAGNOSIS — R0600 Dyspnea, unspecified: Secondary | ICD-10-CM | POA: Diagnosis not present

## 2021-04-30 HISTORY — PX: RIGHT/LEFT HEART CATH AND CORONARY ANGIOGRAPHY: CATH118266

## 2021-04-30 LAB — POCT I-STAT 7, (LYTES, BLD GAS, ICA,H+H)
Acid-Base Excess: 1 mmol/L (ref 0.0–2.0)
Acid-Base Excess: 2 mmol/L (ref 0.0–2.0)
Bicarbonate: 26.1 mmol/L (ref 20.0–28.0)
Bicarbonate: 27.2 mmol/L (ref 20.0–28.0)
Calcium, Ion: 1.25 mmol/L (ref 1.15–1.40)
Calcium, Ion: 1.24 mmol/L (ref 1.15–1.40)
HCT: 41 % (ref 36.0–46.0)
HCT: 40 % (ref 36.0–46.0)
Hemoglobin: 13.9 g/dL (ref 12.0–15.0)
Hemoglobin: 13.6 g/dL (ref 12.0–15.0)
O2 Saturation: 98 %
O2 Saturation: 99 %
Potassium: 4.2 mmol/L (ref 3.5–5.1)
Potassium: 4.1 mmol/L (ref 3.5–5.1)
Sodium: 141 mmol/L (ref 135–145)
Sodium: 141 mmol/L (ref 135–145)
TCO2: 27 mmol/L (ref 22–32)
TCO2: 28 mmol/L (ref 22–32)
pCO2 arterial: 44 mmHg (ref 32.0–48.0)
pCO2 arterial: 44.2 mmHg (ref 32.0–48.0)
pH, Arterial: 7.382 (ref 7.350–7.450)
pH, Arterial: 7.396 (ref 7.350–7.450)
pO2, Arterial: 111 mmHg — ABNORMAL HIGH (ref 83.0–108.0)
pO2, Arterial: 122 mmHg — ABNORMAL HIGH (ref 83.0–108.0)

## 2021-04-30 LAB — GLUCOSE, CAPILLARY
Glucose-Capillary: 126 mg/dL — ABNORMAL HIGH (ref 70–99)
Glucose-Capillary: 129 mg/dL — ABNORMAL HIGH (ref 70–99)
Glucose-Capillary: 127 mg/dL — ABNORMAL HIGH (ref 70–99)
Glucose-Capillary: 90 mg/dL (ref 70–99)

## 2021-04-30 LAB — POCT I-STAT EG7
Acid-Base Excess: 0 mmol/L (ref 0.0–2.0)
Acid-Base Excess: 0 mmol/L (ref 0.0–2.0)
Bicarbonate: 26 mmol/L (ref 20.0–28.0)
Bicarbonate: 26.2 mmol/L (ref 20.0–28.0)
Calcium, Ion: 1.16 mmol/L (ref 1.15–1.40)
Calcium, Ion: 1.22 mmol/L (ref 1.15–1.40)
HCT: 39 % (ref 36.0–46.0)
HCT: 40 % (ref 36.0–46.0)
Hemoglobin: 13.3 g/dL (ref 12.0–15.0)
Hemoglobin: 13.6 g/dL (ref 12.0–15.0)
O2 Saturation: 80 %
O2 Saturation: 81 %
Potassium: 4 mmol/L (ref 3.5–5.1)
Potassium: 4.1 mmol/L (ref 3.5–5.1)
Sodium: 142 mmol/L (ref 135–145)
Sodium: 142 mmol/L (ref 135–145)
TCO2: 27 mmol/L (ref 22–32)
TCO2: 28 mmol/L (ref 22–32)
pCO2, Ven: 45.7 mmHg (ref 44.0–60.0)
pCO2, Ven: 45.3 mmHg (ref 44.0–60.0)
pH, Ven: 7.363 (ref 7.250–7.430)
pH, Ven: 7.37 (ref 7.250–7.430)
pO2, Ven: 46 mmHg — ABNORMAL HIGH (ref 32.0–45.0)
pO2, Ven: 47 mmHg — ABNORMAL HIGH (ref 32.0–45.0)

## 2021-04-30 LAB — CBC
HCT: 46 % (ref 36.0–46.0)
Hemoglobin: 15.3 g/dL — ABNORMAL HIGH (ref 12.0–15.0)
MCH: 32.6 pg (ref 26.0–34.0)
MCHC: 33.3 g/dL (ref 30.0–36.0)
MCV: 98.1 fL (ref 80.0–100.0)
Platelets: 170 10*3/uL (ref 150–400)
RBC: 4.69 MIL/uL (ref 3.87–5.11)
RDW: 12.7 % (ref 11.5–15.5)
WBC: 8.8 10*3/uL (ref 4.0–10.5)
nRBC: 0 % (ref 0.0–0.2)

## 2021-04-30 LAB — BASIC METABOLIC PANEL
Anion gap: 8 (ref 5–15)
BUN: 18 mg/dL (ref 6–20)
CO2: 25 mmol/L (ref 22–32)
Calcium: 9.2 mg/dL (ref 8.9–10.3)
Chloride: 105 mmol/L (ref 98–111)
Creatinine, Ser: 0.65 mg/dL (ref 0.44–1.00)
GFR, Estimated: >60 mL/min (ref >60)
Glucose, Bld: 146 mg/dL — ABNORMAL HIGH (ref 70–99)
Potassium: 4.4 mmol/L (ref 3.5–5.1)
Sodium: 138 mmol/L (ref 135–145)

## 2021-04-30 LAB — MAGNESIUM: Magnesium: 2.2 mg/dL (ref 1.7–2.4)

## 2021-04-30 SURGERY — RIGHT/LEFT HEART CATH AND CORONARY ANGIOGRAPHY
Anesthesia: LOCAL

## 2021-04-30 MED ORDER — SODIUM CHLORIDE 0.9 % WEIGHT BASED INFUSION
1.0000 mL/kg/h | INTRAVENOUS | Status: DC
  Administered 2021-04-30: 1 mL/kg/h via INTRAVENOUS

## 2021-04-30 MED ORDER — VERAPAMIL HCL 2.5 MG/ML IV SOLN
INTRAVENOUS | Status: AC
  Filled 2021-04-30: qty 2

## 2021-04-30 MED ORDER — HEPARIN (PORCINE) IN NACL 1000-0.9 UT/500ML-% IV SOLN
INTRAVENOUS | Status: AC
  Filled 2021-04-30: qty 1000

## 2021-04-30 MED ORDER — HEPARIN SODIUM (PORCINE) 1000 UNIT/ML IJ SOLN
INTRAMUSCULAR | Status: AC
  Filled 2021-04-30: qty 1

## 2021-04-30 MED ORDER — SODIUM CHLORIDE 0.9 % IV SOLN
250.0000 mL | INTRAVENOUS | Status: DC | PRN
Start: 2021-04-30 — End: 2021-04-30
  Administered 2021-04-30: 214 mL via INTRAVENOUS

## 2021-04-30 MED ORDER — LIDOCAINE HCL (PF) 1 % IJ SOLN
INTRAMUSCULAR | Status: DC | PRN
  Administered 2021-04-30 (×2): 2 mL

## 2021-04-30 MED ORDER — SODIUM CHLORIDE 0.9 % WEIGHT BASED INFUSION
3.0000 mL/kg/h | INTRAVENOUS | Status: DC
  Administered 2021-04-30: 3 mL/kg/h via INTRAVENOUS

## 2021-04-30 MED ORDER — MIDAZOLAM HCL 2 MG/2ML IJ SOLN
INTRAMUSCULAR | Status: DC | PRN
  Administered 2021-04-30: 1 mg via INTRAVENOUS

## 2021-04-30 MED ORDER — SODIUM CHLORIDE 0.9% FLUSH
3.0000 mL | Freq: Two times a day (BID) | INTRAVENOUS | Status: DC
  Administered 2021-04-30 – 2021-05-02 (×4): 3 mL via INTRAVENOUS

## 2021-04-30 MED ORDER — IOHEXOL 350 MG/ML SOLN
INTRAVENOUS | Status: DC | PRN
  Administered 2021-04-30: 30 mL

## 2021-04-30 MED ORDER — HEPARIN SODIUM (PORCINE) 5000 UNIT/ML IJ SOLN
5000.0000 [IU] | Freq: Three times a day (TID) | INTRAMUSCULAR | Status: DC
  Administered 2021-04-30 – 2021-05-02 (×5): 5000 [IU] via SUBCUTANEOUS
  Filled 2021-04-30 (×5): qty 1

## 2021-04-30 MED ORDER — SODIUM CHLORIDE 0.9 % IV SOLN: 250.0000 mL | INTRAVENOUS | Status: DC | PRN

## 2021-04-30 MED ORDER — MIDAZOLAM HCL 2 MG/2ML IJ SOLN
INTRAMUSCULAR | Status: AC
  Filled 2021-04-30: qty 2

## 2021-04-30 MED ORDER — FENTANYL CITRATE (PF) 100 MCG/2ML IJ SOLN
INTRAMUSCULAR | Status: DC | PRN
  Administered 2021-04-30: 25 ug via INTRAVENOUS

## 2021-04-30 MED ORDER — LABETALOL HCL 5 MG/ML IV SOLN: 10.0000 mg | INTRAVENOUS | Status: AC | PRN

## 2021-04-30 MED ORDER — FENTANYL CITRATE (PF) 100 MCG/2ML IJ SOLN
INTRAMUSCULAR | Status: AC
  Filled 2021-04-30: qty 2

## 2021-04-30 MED ORDER — HEPARIN (PORCINE) IN NACL 1000-0.9 UT/500ML-% IV SOLN
INTRAVENOUS | Status: DC | PRN
  Administered 2021-04-30 (×2): 500 mL

## 2021-04-30 MED ORDER — LIDOCAINE HCL (PF) 1 % IJ SOLN
INTRAMUSCULAR | Status: AC
  Filled 2021-04-30: qty 30

## 2021-04-30 MED ORDER — SODIUM CHLORIDE 0.9% FLUSH
3.0000 mL | Freq: Two times a day (BID) | INTRAVENOUS | Status: DC
  Administered 2021-04-30: 3 mL via INTRAVENOUS

## 2021-04-30 MED ORDER — SODIUM CHLORIDE 0.9% FLUSH: 3.0000 mL | INTRAVENOUS | Status: DC | PRN

## 2021-04-30 MED ORDER — SODIUM CHLORIDE 0.9 % IV SOLN: INTRAVENOUS | Status: AC

## 2021-04-30 MED ORDER — NITROGLYCERIN 1 MG/10 ML FOR IR/CATH LAB
INTRA_ARTERIAL | Status: AC
  Filled 2021-04-30: qty 10

## 2021-04-30 MED ORDER — ASPIRIN 81 MG PO CHEW
81.0000 mg | CHEWABLE_TABLET | ORAL | Status: AC
  Administered 2021-04-30: 81 mg via ORAL
  Filled 2021-04-30: qty 1

## 2021-04-30 MED ORDER — HEPARIN SODIUM (PORCINE) 1000 UNIT/ML IJ SOLN
INTRAMUSCULAR | Status: DC | PRN
  Administered 2021-04-30: 3000 [IU] via INTRAVENOUS

## 2021-04-30 MED ORDER — METOPROLOL TARTRATE 100 MG PO TABS
100.0000 mg | ORAL_TABLET | Freq: Once | ORAL | Status: DC
  Filled 2021-04-30: qty 1

## 2021-04-30 MED ORDER — VERAPAMIL HCL 2.5 MG/ML IV SOLN
INTRAVENOUS | Status: DC | PRN
  Administered 2021-04-30: 10 mL via INTRA_ARTERIAL

## 2021-04-30 MED ORDER — IPRATROPIUM-ALBUTEROL 0.5-2.5 (3) MG/3ML IN SOLN
3.0000 mL | Freq: Three times a day (TID) | RESPIRATORY_TRACT | Status: DC
  Administered 2021-04-30 (×2): 3 mL via RESPIRATORY_TRACT
  Filled 2021-04-30 (×2): qty 3

## 2021-04-30 SURGICAL SUPPLY — 11 items
CATH BALLN WEDGE 5F 110CM (CATHETERS) ×1 IMPLANT
CATH OPTITORQUE TIG 4.0 5F (CATHETERS) ×1 IMPLANT
DEVICE RAD TR BAND REGULAR (VASCULAR PRODUCTS) ×1 IMPLANT
GLIDESHEATH SLEND SS 6F .021 (SHEATH) ×1 IMPLANT
GUIDEWIRE INQWIRE 1.5J.035X260 (WIRE) IMPLANT
INQWIRE 1.5J .035X260CM (WIRE) ×2
KIT HEART LEFT (KITS) ×2 IMPLANT
PACK CARDIAC CATHETERIZATION (CUSTOM PROCEDURE TRAY) ×2 IMPLANT
SHEATH GLIDE SLENDER 4/5FR (SHEATH) ×1 IMPLANT
TRANSDUCER W/STOPCOCK (MISCELLANEOUS) ×2 IMPLANT
TUBING CIL FLEX 10 FLL-RA (TUBING) ×2 IMPLANT

## 2021-04-30 NOTE — Plan of Care (Signed)
  Problem: Health Behavior/Discharge Planning: Goal: Ability to manage health-related needs will improve Outcome: Progressing   Problem: Clinical Measurements: Goal: Ability to maintain clinical measurements within normal limits will improve Outcome: Progressing   Problem: Clinical Measurements: Goal: Will remain free from infection Outcome: Progressing   Problem: Clinical Measurements: Goal: Diagnostic test results will improve Outcome: Progressing   Problem: Clinical Measurements: Goal: Respiratory complications will improve Outcome: Progressing   Problem: Clinical Measurements: Goal: Cardiovascular complication will be avoided Outcome: Progressing   Problem: Activity: Goal: Risk for activity intolerance will decrease Outcome: Progressing   Problem: Safety: Goal: Ability to remain free from injury will improve Outcome: Progressing   Problem: Skin Integrity: Goal: Risk for impaired skin integrity will decrease Outcome: Progressing

## 2021-04-30 NOTE — Progress Notes (Signed)
Progress Note  Patient Name: GLADIE GRAVETTE Date of Encounter: 04/30/2021  [facility] Cardiologist: Dina Rich, MD   Subjective   No complaints  Inpatient Medications    Scheduled Meds:  azithromycin  500 mg Oral Daily   heparin  5,000 Units Subcutaneous Q8H   insulin aspart  0-5 Units Subcutaneous QHS   insulin aspart  0-9 Units Subcutaneous TID WC   ipratropium-albuterol  3 mL Nebulization TID   methylPREDNISolone (SOLU-MEDROL) injection  40 mg Intravenous Q8H   mometasone-formoterol  2 puff Inhalation BID   Continuous Infusions:  PRN Meds: acetaminophen **OR** acetaminophen, albuterol, hydrALAZINE, HYDROcodone-acetaminophen, metoprolol tartrate, morphine injection, ondansetron **OR** ondansetron (ZOFRAN) IV, senna-docusate, traZODone   Vital Signs    Vitals:   04/29/21 2032 04/29/21 2033 04/30/21 0348 04/30/21 0500  BP:   121/66   Pulse:   78   Resp:   20   Temp:   98.3 F (36.8 C)   TempSrc:      SpO2: 92% 92% 95%   Weight:    71.4 kg  Height:        Intake/Output Summary (Last 24 hours) at 04/30/2021 0757 Last data filed at 04/29/2021 1200 Gross per 24 hour  Intake 480 ml  Output --  Net 480 ml   Last 3 Weights 04/30/2021 04/29/2021 04/28/2021  Weight (lbs) 157 lb 4.8 oz 163 lb 4.8 oz 160 lb  Weight (kg) 71.351 kg 74.072 kg 72.576 kg      Telemetry    NSR - Personally Reviewed  ECG    N/a - Personally Reviewed  Physical Exam   GEN: No acute distress.   Neck: No JVD Cardiac: RRR, 3/6 systolic murmur rusb Respiratory: Clear to auscultation bilaterally. GI: Soft, nontender, non-distended  MS: No edema; No deformity. Neuro:  Nonfocal  Psych: Normal affect   Labs    High Sensitivity Troponin:   Recent Labs  Lab 04/29/21 0007 04/29/21 0229  TROPONINIHS 3 3     Chemistry Recent Labs  Lab 04/29/21 0007 04/29/21 0505 04/30/21 0507  NA 138 139 138  K 3.9 3.5 4.4  CL 106 106 105  CO2 28 26 25   GLUCOSE 100* 152* 146*  BUN 15  14 18   CREATININE 0.71 0.56 0.65  CALCIUM 8.3* 8.5* 9.2  MG  --  2.0 2.2  PROT  --  6.3*  --   ALBUMIN  --  3.7  --   AST  --  12*  --   ALT  --  12  --   ALKPHOS  --  44  --   BILITOT  --  0.7  --   GFRNONAA >60 >60 >60  ANIONGAP 4* 7 8    Lipids No results for input(s): CHOL, TRIG, HDL, LABVLDL, LDLCALC, CHOLHDL in the last 168 hours.  Hematology Recent Labs  Lab 04/29/21 0007 04/29/21 0505 04/30/21 0507  WBC 6.8 5.8 8.8  RBC 4.96 4.64 4.69  HGB 16.2* 15.1* 15.3*  HCT 49.2* 44.7 46.0  MCV 99.2 96.3 98.1  MCH 32.7 32.5 32.6  MCHC 32.9 33.8 33.3  RDW 12.6 12.5 12.7  PLT 154 140* 170   Thyroid No results for input(s): TSH, FREET4 in the last 168 hours.  BNP Recent Labs  Lab 04/29/21 0007  BNP 37.0    DDimer  Recent Labs  Lab 04/29/21 0007  DDIMER 2.47*     Radiology    DG Chest 2 View  Result Date: 04/28/2021 CLINICAL DATA:  Shortness  of breath EXAM: CHEST - 2 VIEW COMPARISON:  04/06/2020 FINDINGS: Hyperinflated lungs. The heart size and mediastinal contours are within normal limits. No focal pulmonary opacity. No pleural effusion or pneumothorax. No acute osseous abnormality. Right upper quadrant surgical clips. IMPRESSION: No acute cardiopulmonary disease. Electronically Signed   By: Wiliam Ke M.D.   On: 04/28/2021 22:27   CT Angio Chest PE W and/or Wo Contrast  Result Date: 04/29/2021 CLINICAL DATA:  PE suspected, low/intermediate prob, positive D-dimer. Dyspnea. EXAM: CT ANGIOGRAPHY CHEST WITH CONTRAST TECHNIQUE: Multidetector CT imaging of the chest was performed using the standard protocol during bolus administration of intravenous contrast. Multiplanar CT image reconstructions and MIPs were obtained to evaluate the vascular anatomy. CONTRAST:  39mL OMNIPAQUE IOHEXOL 350 MG/ML SOLN COMPARISON:  None. FINDINGS: Cardiovascular: There is adequate opacification of the pulmonary arterial tree. No intraluminal filling defect identified to suggest acute pulmonary  embolism. The central pulmonary arteries are of normal caliber. There is calcification of the aortic valve leaflets noted. Global cardiac size within normal limits. No significant coronary artery calcification. No pericardial effusion. Mild atherosclerotic calcification noted within the thoracic aorta. No aortic aneurysm. Mediastinum/Nodes: No enlarged mediastinal, hilar, or axillary lymph nodes. Thyroid gland and esophagus demonstrate no significant findings. Lungs/Pleura: Moderate centrilobular emphysema. The lungs appear mildly hyperinflated. Mild diffuse bronchial wall thickening is in keeping with airway inflammation. Mean 7 mm noncalcified pulmonary nodule is seen within the right upper lobe, axial image # 67/6, indeterminate. No focal pulmonary infiltrate. No pneumothorax or pleural effusion. No central obstructing mass. Layering debris is seen within the distal trachea. Upper Abdomen: Limited images of the upper abdomen demonstrates severe progressive atrophy of the right hepatic lobe and compensatory hypertrophy of the left hepatic lobe when compared to prior examination of 05/02/2009. Surgical clips are seen within the hepatic hilum, of unclear significance. Stable intrahepatic biliary ductal dilation within the atrophic right hepatic lobe. No acute abnormality. Musculoskeletal: Degenerative changes are seen within the thoracic spine. No lytic or blastic bone lesions are seen. Remote T12 superior endplate fracture. Review of the MIP images confirms the above findings. IMPRESSION: No pulmonary embolism. Moderate centrilobular emphysema. Mild bronchial wall thickening in keeping with chronic airway inflammation. 7 mm noncalcified indeterminate pulmonary nodule within the right upper lobe. Non-contrast chest CT at 6-12 months is recommended. If the nodule is stable at time of repeat CT, then future CT at 18-24 months (from today's scan) is considered optional for low-risk patients, but is recommended for  high-risk patients. This recommendation follows the consensus statement: Guidelines for Management of Incidental Pulmonary Nodules Detected on CT Images: From the Fleischner Society 2017; Radiology 2017; 284:228-243. Aortic valve calcification. Echocardiography may be helpful to assess the degree of valvular dysfunction. Progressive marked atrophy of the right hepatic lobe, likely related to chronic biliary obstruction, incompletely evaluated on this examination. This was better assessed on CT examination of 05/02/2009. Emphysema (ICD10-J43.9). Electronically Signed   By: Helyn Numbers M.D.   On: 04/29/2021 01:26   ECHOCARDIOGRAM COMPLETE  Result Date: 04/29/2021    ECHOCARDIOGRAM REPORT   Patient Name:   ASAMI LAMBRIGHT Date of Exam: 04/29/2021 Medical Rec #:  [id number]     Height:       69.0 in Accession #:    [id number]    Weight:       163.3 lb Date of Birth:  [birth year]    BSA:          1.895 m Patient Age:    60  years      BP:           137/73 mmHg Patient Gender: F             HR:           70 bpm. [facility] Procedure: 2D Echo, Cardiac Doppler and Color Doppler Indications:    Aortic valve disorder  History:        Patient has no prior history of Echocardiogram examinations.                 COPD, Signs/Symptoms:Chest Pain; Risk Factors:Tobacco use.  Sonographer:    Mikki Harbor Referring Phys: [id number] ASIA B ZIERLE-GHOSH IMPRESSIONS  1. Left ventricular ejection fraction, by estimation, is 70 to 75%. The left ventricle has hyperdynamic function. The left ventricle has no regional wall motion abnormalities. Left ventricular diastolic parameters were normal.  2. Right ventricular systolic function is normal. The right ventricular size is normal. There is mildly elevated pulmonary artery systolic pressure.  3. The mitral valve is normal in structure. No evidence of mitral valve regurgitation. No evidence of mitral stenosis.  4. The aortic valve was not well visualized. There is moderate  calcification of the aortic valve. There is moderate thickening of the aortic valve. Aortic valve regurgitation is not visualized. Severe aortic valve stenosis. Aortic valve mean gradient measures 40.2 mmHg. Aortic valve peak gradient measures 64.0 mmHg. Aortic valve area, by VTI measures 0.84 cm.     DI 0.27  5. The inferior vena cava is normal in size with greater than 50% respiratory variability, suggesting right atrial pressure of 3 mmHg. FINDINGS  Left Ventricle: Left ventricular ejection fraction, by estimation, is 70 to 75%. The left ventricle has hyperdynamic function. The left ventricle has no regional wall motion abnormalities. The left ventricular internal cavity size was normal in size. There is no left ventricular hypertrophy. Left ventricular diastolic parameters were normal. Right Ventricle: The right ventricular size is normal. No increase in right ventricular wall thickness. Right ventricular systolic function is normal. There is mildly elevated pulmonary artery systolic pressure. The tricuspid regurgitant velocity is 2.69  m/s, and with an assumed right atrial pressure of 8 mmHg, the estimated right ventricular systolic pressure is 36.9 mmHg. Left Atrium: Left atrial size was normal in size. Right Atrium: Right atrial size was normal in size. Pericardium: There is no evidence of pericardial effusion. Mitral Valve: The mitral valve is normal in structure. No evidence of mitral valve regurgitation. No evidence of mitral valve stenosis. MV peak gradient, 6.2 mmHg. The mean mitral valve gradient is 2.0 mmHg. Tricuspid Valve: The tricuspid valve is normal in structure. Tricuspid valve regurgitation is mild . No evidence of tricuspid stenosis. Aortic Valve: The aortic valve was not well visualized. There is moderate calcification of the aortic valve. There is moderate thickening of the aortic valve. There is moderate aortic valve annular calcification. Aortic valve regurgitation is not visualized.  Severe aortic stenosis is present. Aortic valve mean gradient measures 40.2 mmHg. Aortic valve peak gradient measures 64.0 mmHg. Aortic valve area, by VTI measures 0.84 cm. Pulmonic Valve: The pulmonic valve was not well visualized. Pulmonic valve regurgitation is not visualized. No evidence of pulmonic stenosis. Aorta: The aortic root is normal in size and structure. Venous: The inferior vena cava is normal in size with greater than 50% respiratory variability, suggesting right atrial pressure of 3 mmHg. IAS/Shunts: The interatrial septum was not well visualized.  LEFT VENTRICLE PLAX 2D  LVIDd:         4.40 cm  Diastology LVIDs:         2.50 cm  LV e' medial:    9.14 cm/s LV PW:         0.80 cm  LV E/e' medial:  8.6 LV IVS:        0.90 cm  LV e' lateral:   10.10 cm/s LVOT diam:     2.00 cm  LV E/e' lateral: 7.8 LV SV:         80 LV SV Index:   42 LVOT Area:     3.14 cm  RIGHT VENTRICLE RV Basal diam:  2.65 cm RV Mid diam:    2.30 cm RV S prime:     14.80 cm/s TAPSE (M-mode): 2.6 cm LEFT ATRIUM             Index       RIGHT ATRIUM           Index LA diam:        3.10 cm 1.64 cm/m  RA Area:     11.70 cm LA Vol (A2C):   45.4 ml 23.95 ml/m RA Volume:   26.20 ml  13.82 ml/m LA Vol (A4C):   26.8 ml 14.14 ml/m LA Biplane Vol: 38.6 ml 20.36 ml/m  AORTIC VALVE AV Area (Vmax):    0.81 cm AV Area (Vmean):   0.81 cm AV Area (VTI):     0.84 cm AV Vmax:           400.00 cm/s AV Vmean:          289.970 cm/s AV VTI:            0.958 m AV Peak Grad:      64.0 mmHg AV Mean Grad:      40.2 mmHg LVOT Vmax:         103.00 cm/s LVOT Vmean:        74.800 cm/s LVOT VTI:          0.256 m LVOT/AV VTI ratio: 0.27  AORTA Ao Root diam: 3.20 cm Ao Asc diam:  3.00 cm MITRAL VALVE               TRICUSPID VALVE MV Area (PHT): 2.81 cm    TR Peak grad:   28.9 mmHg MV Area VTI:   2.62 cm    TR Vmax:        269.00 cm/s MV Peak grad:  6.2 mmHg MV Mean grad:  2.0 mmHg    SHUNTS MV Vmax:       1.24 m/s    Systemic VTI:  0.26 m MV Vmean:       65.6 cm/s   Systemic Diam: 2.00 cm MV Decel Time: 270 msec MV E velocity: 78.50 cm/s MV A velocity: 87.70 cm/s MV E/A ratio:  0.90 Dina Rich MD Electronically signed by Dina Rich MD Signature Date/Time: 04/29/2021/4:08:19 PM    Final     Cardiac Studies    Patient Profile     60 y.o. female without prior cardiac history presents with DOE.   Assessment & Plan    Severe aortic stenosis - progressing DOE over the last year, now severe symptoms with limited exertion - echo shows normal LVEF, severe AS mean grad 40, AVA VTI 0.84, DI 0.27 - given her age would suspect possible bicupsid valve but unable to distinguish from imaging  - needs evlauation for AVR. given severe AS and  the degree of her symptoms would favor completing workup inpatient to expedite the timing of her procedure, defer ultimate timing to potential AVR to structural team - plan for transfer to [place] today for LHC/RHC   2. Suspected COPD - per primary team  I have reviewed the risks, indications, and alternatives to cardiac catheterization, possible angioplasty, and stenting with the patient oday. Risks include but are not limited to bleeding, infection, vascular injury, stroke, myocardial infection, arrhythmia, kidney injury, radiation-related injury in the case of prolonged fluoroscopy use, emergency cardiac surgery, and death. The patient understands the risks of serious complication is 1-2 in 1000 with diagnostic cardiac cath and 1-2% or less with angioplasty/stenting.     Will discuss transfer with primary team to medicine team, we will arrange LHC/RHC. Structural heart team will need to be consulted after cath.    For questions or updates, please contact [facility] Please consult [email or web address] for contact info under        Signed, Mirinda Monte, MD  04/30/2021, 7:57 AM    

## 2021-04-30 NOTE — Progress Notes (Signed)
The EKG that was done last night by the night shift RT at 20:42 does not belong to this patient. The EKG somehow crossed over and is now showing up in this patients chart. RT manager is aware.

## 2021-04-30 NOTE — H&P (View-Only) (Signed)
Progress Note  Patient Name: Crystal Silva Date of Encounter: 04/30/2021  [facility] Cardiologist: Dina Rich, MD   Subjective   No complaints  Inpatient Medications    Scheduled Meds:  azithromycin  500 mg Oral Daily   heparin  5,000 Units Subcutaneous Q8H   insulin aspart  0-5 Units Subcutaneous QHS   insulin aspart  0-9 Units Subcutaneous TID WC   ipratropium-albuterol  3 mL Nebulization TID   methylPREDNISolone (SOLU-MEDROL) injection  40 mg Intravenous Q8H   mometasone-formoterol  2 puff Inhalation BID   Continuous Infusions:  PRN Meds: acetaminophen **OR** acetaminophen, albuterol, hydrALAZINE, HYDROcodone-acetaminophen, metoprolol tartrate, morphine injection, ondansetron **OR** ondansetron (ZOFRAN) IV, senna-docusate, traZODone   Vital Signs    Vitals:   04/29/21 2032 04/29/21 2033 04/30/21 0348 04/30/21 0500  BP:   121/66   Pulse:   78   Resp:   20   Temp:   98.3 F (36.8 C)   TempSrc:      SpO2: 92% 92% 95%   Weight:    71.4 kg  Height:        Intake/Output Summary (Last 24 hours) at 04/30/2021 0757 Last data filed at 04/29/2021 1200 Gross per 24 hour  Intake 480 ml  Output --  Net 480 ml   Last 3 Weights 04/30/2021 04/29/2021 04/28/2021  Weight (lbs) 157 lb 4.8 oz 163 lb 4.8 oz 160 lb  Weight (kg) 71.351 kg 74.072 kg 72.576 kg      Telemetry    NSR - Personally Reviewed  ECG    N/a - Personally Reviewed  Physical Exam   GEN: No acute distress.   Neck: No JVD Cardiac: RRR, 3/6 systolic murmur rusb Respiratory: Clear to auscultation bilaterally. GI: Soft, nontender, non-distended  MS: No edema; No deformity. Neuro:  Nonfocal  Psych: Normal affect   Labs    High Sensitivity Troponin:   Recent Labs  Lab 04/29/21 0007 04/29/21 0229  TROPONINIHS 3 3     Chemistry Recent Labs  Lab 04/29/21 0007 04/29/21 0505 04/30/21 0507  NA 138 139 138  K 3.9 3.5 4.4  CL 106 106 105  CO2 28 26 25   GLUCOSE 100* 152* 146*  BUN 15  14 18   CREATININE 0.71 0.56 0.65  CALCIUM 8.3* 8.5* 9.2  MG  --  2.0 2.2  PROT  --  6.3*  --   ALBUMIN  --  3.7  --   AST  --  12*  --   ALT  --  12  --   ALKPHOS  --  44  --   BILITOT  --  0.7  --   GFRNONAA >60 >60 >60  ANIONGAP 4* 7 8    Lipids No results for input(s): CHOL, TRIG, HDL, LABVLDL, LDLCALC, CHOLHDL in the last 168 hours.  Hematology Recent Labs  Lab 04/29/21 0007 04/29/21 0505 04/30/21 0507  WBC 6.8 5.8 8.8  RBC 4.96 4.64 4.69  HGB 16.2* 15.1* 15.3*  HCT 49.2* 44.7 46.0  MCV 99.2 96.3 98.1  MCH 32.7 32.5 32.6  MCHC 32.9 33.8 33.3  RDW 12.6 12.5 12.7  PLT 154 140* 170   Thyroid No results for input(s): TSH, FREET4 in the last 168 hours.  BNP Recent Labs  Lab 04/29/21 0007  BNP 37.0    DDimer  Recent Labs  Lab 04/29/21 0007  DDIMER 2.47*     Radiology    DG Chest 2 View  Result Date: 04/28/2021 CLINICAL DATA:  Shortness  of breath EXAM: CHEST - 2 VIEW COMPARISON:  04/06/2020 FINDINGS: Hyperinflated lungs. The heart size and mediastinal contours are within normal limits. No focal pulmonary opacity. No pleural effusion or pneumothorax. No acute osseous abnormality. Right upper quadrant surgical clips. IMPRESSION: No acute cardiopulmonary disease. Electronically Signed   By: Wiliam Ke M.D.   On: 04/28/2021 22:27   CT Angio Chest PE W and/or Wo Contrast  Result Date: 04/29/2021 CLINICAL DATA:  PE suspected, low/intermediate prob, positive D-dimer. Dyspnea. EXAM: CT ANGIOGRAPHY CHEST WITH CONTRAST TECHNIQUE: Multidetector CT imaging of the chest was performed using the standard protocol during bolus administration of intravenous contrast. Multiplanar CT image reconstructions and MIPs were obtained to evaluate the vascular anatomy. CONTRAST:  39mL OMNIPAQUE IOHEXOL 350 MG/ML SOLN COMPARISON:  None. FINDINGS: Cardiovascular: There is adequate opacification of the pulmonary arterial tree. No intraluminal filling defect identified to suggest acute pulmonary  embolism. The central pulmonary arteries are of normal caliber. There is calcification of the aortic valve leaflets noted. Global cardiac size within normal limits. No significant coronary artery calcification. No pericardial effusion. Mild atherosclerotic calcification noted within the thoracic aorta. No aortic aneurysm. Mediastinum/Nodes: No enlarged mediastinal, hilar, or axillary lymph nodes. Thyroid gland and esophagus demonstrate no significant findings. Lungs/Pleura: Moderate centrilobular emphysema. The lungs appear mildly hyperinflated. Mild diffuse bronchial wall thickening is in keeping with airway inflammation. Mean 7 mm noncalcified pulmonary nodule is seen within the right upper lobe, axial image # 67/6, indeterminate. No focal pulmonary infiltrate. No pneumothorax or pleural effusion. No central obstructing mass. Layering debris is seen within the distal trachea. Upper Abdomen: Limited images of the upper abdomen demonstrates severe progressive atrophy of the right hepatic lobe and compensatory hypertrophy of the left hepatic lobe when compared to prior examination of 05/02/2009. Surgical clips are seen within the hepatic hilum, of unclear significance. Stable intrahepatic biliary ductal dilation within the atrophic right hepatic lobe. No acute abnormality. Musculoskeletal: Degenerative changes are seen within the thoracic spine. No lytic or blastic bone lesions are seen. Remote T12 superior endplate fracture. Review of the MIP images confirms the above findings. IMPRESSION: No pulmonary embolism. Moderate centrilobular emphysema. Mild bronchial wall thickening in keeping with chronic airway inflammation. 7 mm noncalcified indeterminate pulmonary nodule within the right upper lobe. Non-contrast chest CT at 6-12 months is recommended. If the nodule is stable at time of repeat CT, then future CT at 18-24 months (from today's scan) is considered optional for low-risk patients, but is recommended for  high-risk patients. This recommendation follows the consensus statement: Guidelines for Management of Incidental Pulmonary Nodules Detected on CT Images: From the Fleischner Society 2017; Radiology 2017; 284:228-243. Aortic valve calcification. Echocardiography may be helpful to assess the degree of valvular dysfunction. Progressive marked atrophy of the right hepatic lobe, likely related to chronic biliary obstruction, incompletely evaluated on this examination. This was better assessed on CT examination of 05/02/2009. Emphysema (ICD10-J43.9). Electronically Signed   By: Helyn Numbers M.D.   On: 04/29/2021 01:26   ECHOCARDIOGRAM COMPLETE  Result Date: 04/29/2021    ECHOCARDIOGRAM REPORT   Patient Name:   Crystal Silva Date of Exam: 04/29/2021 Medical Rec #:  [id number]     Height:       69.0 in Accession #:    [id number]    Weight:       163.3 lb Date of Birth:  [birth year]    BSA:          1.895 m Patient Age:    60  years      BP:           137/73 mmHg Patient Gender: F             HR:           70 bpm. [facility] Procedure: 2D Echo, Cardiac Doppler and Color Doppler Indications:    Aortic valve disorder  History:        Patient has no prior history of Echocardiogram examinations.                 COPD, Signs/Symptoms:Chest Pain; Risk Factors:Tobacco use.  Sonographer:    Mikki Harbor Referring Phys: [id number] ASIA B ZIERLE-GHOSH IMPRESSIONS  1. Left ventricular ejection fraction, by estimation, is 70 to 75%. The left ventricle has hyperdynamic function. The left ventricle has no regional wall motion abnormalities. Left ventricular diastolic parameters were normal.  2. Right ventricular systolic function is normal. The right ventricular size is normal. There is mildly elevated pulmonary artery systolic pressure.  3. The mitral valve is normal in structure. No evidence of mitral valve regurgitation. No evidence of mitral stenosis.  4. The aortic valve was not well visualized. There is moderate  calcification of the aortic valve. There is moderate thickening of the aortic valve. Aortic valve regurgitation is not visualized. Severe aortic valve stenosis. Aortic valve mean gradient measures 40.2 mmHg. Aortic valve peak gradient measures 64.0 mmHg. Aortic valve area, by VTI measures 0.84 cm.     DI 0.27  5. The inferior vena cava is normal in size with greater than 50% respiratory variability, suggesting right atrial pressure of 3 mmHg. FINDINGS  Left Ventricle: Left ventricular ejection fraction, by estimation, is 70 to 75%. The left ventricle has hyperdynamic function. The left ventricle has no regional wall motion abnormalities. The left ventricular internal cavity size was normal in size. There is no left ventricular hypertrophy. Left ventricular diastolic parameters were normal. Right Ventricle: The right ventricular size is normal. No increase in right ventricular wall thickness. Right ventricular systolic function is normal. There is mildly elevated pulmonary artery systolic pressure. The tricuspid regurgitant velocity is 2.69  m/s, and with an assumed right atrial pressure of 8 mmHg, the estimated right ventricular systolic pressure is 36.9 mmHg. Left Atrium: Left atrial size was normal in size. Right Atrium: Right atrial size was normal in size. Pericardium: There is no evidence of pericardial effusion. Mitral Valve: The mitral valve is normal in structure. No evidence of mitral valve regurgitation. No evidence of mitral valve stenosis. MV peak gradient, 6.2 mmHg. The mean mitral valve gradient is 2.0 mmHg. Tricuspid Valve: The tricuspid valve is normal in structure. Tricuspid valve regurgitation is mild . No evidence of tricuspid stenosis. Aortic Valve: The aortic valve was not well visualized. There is moderate calcification of the aortic valve. There is moderate thickening of the aortic valve. There is moderate aortic valve annular calcification. Aortic valve regurgitation is not visualized.  Severe aortic stenosis is present. Aortic valve mean gradient measures 40.2 mmHg. Aortic valve peak gradient measures 64.0 mmHg. Aortic valve area, by VTI measures 0.84 cm. Pulmonic Valve: The pulmonic valve was not well visualized. Pulmonic valve regurgitation is not visualized. No evidence of pulmonic stenosis. Aorta: The aortic root is normal in size and structure. Venous: The inferior vena cava is normal in size with greater than 50% respiratory variability, suggesting right atrial pressure of 3 mmHg. IAS/Shunts: The interatrial septum was not well visualized.  LEFT VENTRICLE PLAX 2D  LVIDd:         4.40 cm  Diastology LVIDs:         2.50 cm  LV e' medial:    9.14 cm/s LV PW:         0.80 cm  LV E/e' medial:  8.6 LV IVS:        0.90 cm  LV e' lateral:   10.10 cm/s LVOT diam:     2.00 cm  LV E/e' lateral: 7.8 LV SV:         80 LV SV Index:   42 LVOT Area:     3.14 cm  RIGHT VENTRICLE RV Basal diam:  2.65 cm RV Mid diam:    2.30 cm RV S prime:     14.80 cm/s TAPSE (M-mode): 2.6 cm LEFT ATRIUM             Index       RIGHT ATRIUM           Index LA diam:        3.10 cm 1.64 cm/m  RA Area:     11.70 cm LA Vol (A2C):   45.4 ml 23.95 ml/m RA Volume:   26.20 ml  13.82 ml/m LA Vol (A4C):   26.8 ml 14.14 ml/m LA Biplane Vol: 38.6 ml 20.36 ml/m  AORTIC VALVE AV Area (Vmax):    0.81 cm AV Area (Vmean):   0.81 cm AV Area (VTI):     0.84 cm AV Vmax:           400.00 cm/s AV Vmean:          289.970 cm/s AV VTI:            0.958 m AV Peak Grad:      64.0 mmHg AV Mean Grad:      40.2 mmHg LVOT Vmax:         103.00 cm/s LVOT Vmean:        74.800 cm/s LVOT VTI:          0.256 m LVOT/AV VTI ratio: 0.27  AORTA Ao Root diam: 3.20 cm Ao Asc diam:  3.00 cm MITRAL VALVE               TRICUSPID VALVE MV Area (PHT): 2.81 cm    TR Peak grad:   28.9 mmHg MV Area VTI:   2.62 cm    TR Vmax:        269.00 cm/s MV Peak grad:  6.2 mmHg MV Mean grad:  2.0 mmHg    SHUNTS MV Vmax:       1.24 m/s    Systemic VTI:  0.26 m MV Vmean:       65.6 cm/s   Systemic Diam: 2.00 cm MV Decel Time: 270 msec MV E velocity: 78.50 cm/s MV A velocity: 87.70 cm/s MV E/A ratio:  0.90 Dina Rich MD Electronically signed by Dina Rich MD Signature Date/Time: 04/29/2021/4:08:19 PM    Final     Cardiac Studies    Patient Profile     60 y.o. female without prior cardiac history presents with DOE.   Assessment & Plan    Severe aortic stenosis - progressing DOE over the last year, now severe symptoms with limited exertion - echo shows normal LVEF, severe AS mean grad 40, AVA VTI 0.84, DI 0.27 - given her age would suspect possible bicupsid valve but unable to distinguish from imaging  - needs evlauation for AVR. given severe AS and  the degree of her symptoms would favor completing workup inpatient to expedite the timing of her procedure, defer ultimate timing to potential AVR to structural team - plan for transfer to [facility] today for LHC/RHC   2. Suspected COPD - per primary team  I have reviewed the risks, indications, and alternatives to cardiac catheterization, possible angioplasty, and stenting with the patient oday. Risks include but are not limited to bleeding, infection, vascular injury, stroke, myocardial infection, arrhythmia, kidney injury, radiation-related injury in the case of prolonged fluoroscopy use, emergency cardiac surgery, and death. The patient understands the risks of serious complication is 1-2 in 1000 with diagnostic cardiac cath and 1-2% or less with angioplasty/stenting.     Will discuss transfer with primary team to medicine team, we will arrange LHC/RHC. Structural heart team will need to be consulted after cath.    For questions or updates, please contact [facility] Please consult [email or web address] for contact info under        Signed, Dina Rich, MD  04/30/2021, 7:57 AM

## 2021-04-30 NOTE — Progress Notes (Signed)
Pt BIB [facility] from [facility] for a heart cath. Report given. Pt placed on cardiac monitor. A&O x4.

## 2021-04-30 NOTE — Interval H&P Note (Signed)
History and Physical Interval Note:  04/30/2021 2:23 PM  Crystal Silva  has presented today for surgery, with the diagnosis of aortic stenosis.  The various methods of treatment have been discussed with the patient and family. After consideration of risks, benefits and other options for treatment, the patient has consented to  Procedure(s): RIGHT/LEFT HEART CATH AND CORONARY ANGIOGRAPHY (N/A)  PERCUTANEOUS CORONARY INTERVENTION  as a surgical intervention.  The patient's history has been reviewed, patient examined, no change in status, stable for surgery.  I have reviewed the patient's chart and labs.  Questions were answered to the patient's satisfaction.    Cath Lab Visit (complete for each Cath Lab visit)  Clinical Evaluation Leading to the Procedure:   ACS: No.  Non-ACS:    Anginal Classification: CCS III  Anti-ischemic medical therapy: Minimal Therapy (1 class of medications)  Non-Invasive Test Results: No non-invasive testing performed  Prior CABG: No previous CABG   Bryan Lemma

## 2021-04-30 NOTE — Progress Notes (Signed)
Pt has left with [facility] transportation.

## 2021-04-30 NOTE — Progress Notes (Signed)
PROGRESS NOTE    Crystal Silva  EAV:[id number] DOB: [birth year] DOA: 04/28/2021 PCP: Patient, No Pcp Per (Inactive)   Brief Narrative:  60 year old female known past medical history, tobacco user-1 pack daily comes to the hospital with dyspnea on exertion which has progressed over the last month.  She was admitted to the hospital for COPD exacerbation.  Echocardiogram ordered due to new diagnosis of systolic murmur.  CTA chest was negative for pulmonary embolism, showed moderate centrilobular emphysema.  There was also 7 mm noncalcified pulmonary nodule in the right upper lobe and was recommended repeat CT scan in 6 to 12 months.  Echocardiogram showed severe aortic stenosis therefore cardiology arranging for transfer patient to [facility] for RHC/LHC.   Assessment & Plan:   Active Problems:   Chest pain   Tobacco use disorder   Elevated d-dimer   COPD (chronic obstructive pulmonary disease) (HCC)   Aortic valve disorder   COPD exacerbation (HCC)  Acute respiratory distress secondary to COPD exacerbation Tobacco use - Seems to be improving today.  Continue IV steroids, bronchodilators scheduled as needed, I-S/flutter.  Azithromycin day 2/5.  Nicotine patch as needed.  Severe aortic stenosis - Echocardiogram shows severe aortic stenosis therefore cardiology recommending transferring patient to [facility] for LHC/RHC.  Pulmonary nodule, 7 mm noncalcified - We will need repeat CT scan in 6-12 months with outpatient PCP  Patient signed out to Dr. Sharyn Creamer   DVT prophylaxis: heparin injection 5,000 Units Start: 04/29/21 0600 SCDs Start: 04/29/21 0405  Code Status: Full code Family Communication:     Dispo: The patient is from: Home              Anticipated d/c is to: Home              Patient currently is not medically stable to d/c.   Difficult to place patient No        Subjective: Shortness of breath is improved, denies any chest pain.  Still having some hacking  cough Review of Systems Otherwise negative except as per HPI, including: General: Denies fever, chills, night sweats or unintended weight loss. Resp: Denies hemoptysis Cardiac: Denies chest pain, palpitations, orthopnea, paroxysmal nocturnal dyspnea. GI: Denies abdominal pain, nausea, vomiting, diarrhea or constipation GU: Denies dysuria, frequency, hesitancy or incontinence MS: Denies muscle aches, joint pain or swelling Neuro: Denies headache, neurologic deficits (focal weakness, numbness, tingling), abnormal gait Psych: Denies anxiety, depression, SI/HI/AVH Skin: Denies new rashes or lesions ID: Denies sick contacts, exotic exposures, travel  Examination:  Constitutional: Not in acute distress Respiratory: Bilateral rhonchi, improved from yesterday Cardiovascular: Normal sinus rhythm, no rubs.  Positive systolic murmur in the second left IC Abdomen: Nontender nondistended good bowel sounds Musculoskeletal: No edema noted Skin: No rashes seen Neurologic: CN 2-12 grossly intact.  And nonfocal Psychiatric: Normal judgment and insight. Alert and oriented x 3. Normal mood.  Objective: Vitals:   04/29/21 2033 04/30/21 0348 04/30/21 0500 04/30/21 0928  BP:  121/66    Pulse:  78    Resp:  20    Temp:  98.3 F (36.8 C)    TempSrc:      SpO2: 92% 95%  96%  Weight:   71.4 kg   Height:        Intake/Output Summary (Last 24 hours) at 04/30/2021 1037 Last data filed at 04/29/2021 1200 Gross per 24 hour  Intake 240 ml  Output --  Net 240 ml   Filed Weights   04/28/21 2201  04/29/21 0500 04/30/21 0500  Weight: 72.6 kg 74.1 kg 71.4 kg     Data Reviewed:   CBC: Recent Labs  Lab 04/29/21 0007 04/29/21 0505 04/30/21 0507  WBC 6.8 5.8 8.8  NEUTROABS 3.6  --   --   HGB 16.2* 15.1* 15.3*  HCT 49.2* 44.7 46.0  MCV 99.2 96.3 98.1  PLT 154 140* 170   Basic Metabolic Panel: Recent Labs  Lab 04/29/21 0007 04/29/21 0505 04/30/21 0507  NA 138 139 138  K 3.9 3.5 4.4  CL  106 106 105  CO2 28 26 25   GLUCOSE 100* 152* 146*  BUN 15 14 18   CREATININE 0.71 0.56 0.65  CALCIUM 8.3* 8.5* 9.2  MG  --  2.0 2.2   GFR: Estimated Creatinine Clearance: 80.1 mL/min (by C-G formula based on SCr of 0.65 mg/dL). Liver Function Tests: Recent Labs  Lab 04/29/21 0505  AST 12*  ALT 12  ALKPHOS 44  BILITOT 0.7  PROT 6.3*  ALBUMIN 3.7   No results for input(s): LIPASE, AMYLASE in the last 168 hours. No results for input(s): AMMONIA in the last 168 hours. Coagulation Profile: No results for input(s): INR, PROTIME in the last 168 hours. Cardiac Enzymes: No results for input(s): CKTOTAL, CKMB, CKMBINDEX, TROPONINI in the last 168 hours. BNP (last 3 results) No results for input(s): PROBNP in the last 8760 hours. HbA1C: No results for input(s): HGBA1C in the last 72 hours. CBG: Recent Labs  Lab 04/29/21 1206 04/29/21 1637 04/29/21 2006 04/30/21 0735  GLUCAP 134* 120* 127* 126*   Lipid Profile: No results for input(s): CHOL, HDL, LDLCALC, TRIG, CHOLHDL, LDLDIRECT in the last 72 hours. Thyroid Function Tests: No results for input(s): TSH, T4TOTAL, FREET4, T3FREE, THYROIDAB in the last 72 hours. Anemia Panel: No results for input(s): VITAMINB12, FOLATE, FERRITIN, TIBC, IRON, RETICCTPCT in the last 72 hours. Sepsis Labs: No results for input(s): PROCALCITON, LATICACIDVEN in the last 168 hours.  Recent Results (from the past 240 hour(s))  Resp Panel by RT-PCR (Flu A&B, Covid) Nasopharyngeal Swab     Status: None   Collection Time: 04/29/21 12:05 AM   Specimen: Nasopharyngeal Swab; Nasopharyngeal(NP) swabs in vial transport medium  Result Value Ref Range Status   SARS Coronavirus 2 by RT PCR NEGATIVE NEGATIVE Final    Comment: (NOTE) SARS-CoV-2 target nucleic acids are NOT DETECTED.  The SARS-CoV-2 RNA is generally detectable in upper respiratory specimens during the acute phase of infection. The lowest concentration of SARS-CoV-2 viral copies this assay can  detect is 138 copies/mL. A negative result does not preclude SARS-Cov-2 infection and should not be used as the sole basis for treatment or other patient management decisions. A negative result may occur with  improper specimen collection/handling, submission of specimen other than nasopharyngeal swab, presence of viral mutation(s) within the areas targeted by this assay, and inadequate number of viral copies(<138 copies/mL). A negative result must be combined with clinical observations, patient history, and epidemiological information. The expected result is Negative.  Fact Sheet for Patients:  BloggerCourse.com  Fact Sheet for Healthcare Providers:  SeriousBroker.it  This test is no t yet approved or cleared by the [place] FDA and  has been authorized for detection and/or diagnosis of SARS-CoV-2 by FDA under an Emergency Use Authorization (EUA). This EUA will remain  in effect (meaning this test can be used) for the duration of the COVID-19 declaration under Section 564(b)(1) of the Act, 21 U.S.C.section 360bbb-3(b)(1), unless the authorization is terminated  or revoked  sooner.       Influenza A by PCR NEGATIVE NEGATIVE Final   Influenza B by PCR NEGATIVE NEGATIVE Final    Comment: (NOTE) The Xpert Xpress SARS-CoV-2/FLU/RSV plus assay is intended as an aid in the diagnosis of influenza from Nasopharyngeal swab specimens and should not be used as a sole basis for treatment. Nasal washings and aspirates are unacceptable for Xpert Xpress SARS-CoV-2/FLU/RSV testing.  Fact Sheet for Patients: BloggerCourse.com  Fact Sheet for Healthcare Providers: SeriousBroker.it  This test is not yet approved or cleared by the [place] FDA and has been authorized for detection and/or diagnosis of SARS-CoV-2 by FDA under an Emergency Use Authorization (EUA). This EUA will remain in  effect (meaning this test can be used) for the duration of the COVID-19 declaration under Section 564(b)(1) of the Act, 21 U.S.C. section 360bbb-3(b)(1), unless the authorization is terminated or revoked.  Performed at [facility], [address] [place], [place] [address]          Radiology Studies: DG Chest 2 View  Result Date: 04/28/2021 CLINICAL DATA:  Shortness of breath EXAM: CHEST - 2 VIEW COMPARISON:  04/06/2020 FINDINGS: Hyperinflated lungs. The heart size and mediastinal contours are within normal limits. No focal pulmonary opacity. No pleural effusion or pneumothorax. No acute osseous abnormality. Right upper quadrant surgical clips. IMPRESSION: No acute cardiopulmonary disease. Electronically Signed   By: Wiliam Ke M.D.   On: 04/28/2021 22:27   CT Angio Chest PE W and/or Wo Contrast  Result Date: 04/29/2021 CLINICAL DATA:  PE suspected, low/intermediate prob, positive D-dimer. Dyspnea. EXAM: CT ANGIOGRAPHY CHEST WITH CONTRAST TECHNIQUE: Multidetector CT imaging of the chest was performed using the standard protocol during bolus administration of intravenous contrast. Multiplanar CT image reconstructions and MIPs were obtained to evaluate the vascular anatomy. CONTRAST:  74mL OMNIPAQUE IOHEXOL 350 MG/ML SOLN COMPARISON:  None. FINDINGS: Cardiovascular: There is adequate opacification of the pulmonary arterial tree. No intraluminal filling defect identified to suggest acute pulmonary embolism. The central pulmonary arteries are of normal caliber. There is calcification of the aortic valve leaflets noted. Global cardiac size within normal limits. No significant coronary artery calcification. No pericardial effusion. Mild atherosclerotic calcification noted within the thoracic aorta. No aortic aneurysm. Mediastinum/Nodes: No enlarged mediastinal, hilar, or axillary lymph nodes. Thyroid gland and esophagus demonstrate no significant findings. Lungs/Pleura: Moderate centrilobular  emphysema. The lungs appear mildly hyperinflated. Mild diffuse bronchial wall thickening is in keeping with airway inflammation. Mean 7 mm noncalcified pulmonary nodule is seen within the right upper lobe, axial image # 67/6, indeterminate. No focal pulmonary infiltrate. No pneumothorax or pleural effusion. No central obstructing mass. Layering debris is seen within the distal trachea. Upper Abdomen: Limited images of the upper abdomen demonstrates severe progressive atrophy of the right hepatic lobe and compensatory hypertrophy of the left hepatic lobe when compared to prior examination of 05/02/2009. Surgical clips are seen within the hepatic hilum, of unclear significance. Stable intrahepatic biliary ductal dilation within the atrophic right hepatic lobe. No acute abnormality. Musculoskeletal: Degenerative changes are seen within the thoracic spine. No lytic or blastic bone lesions are seen. Remote T12 superior endplate fracture. Review of the MIP images confirms the above findings. IMPRESSION: No pulmonary embolism. Moderate centrilobular emphysema. Mild bronchial wall thickening in keeping with chronic airway inflammation. 7 mm noncalcified indeterminate pulmonary nodule within the right upper lobe. Non-contrast chest CT at 6-12 months is recommended. If the nodule is stable at time of repeat CT, then future CT at 18-24 months (  from today's scan) is considered optional for low-risk patients, but is recommended for high-risk patients. This recommendation follows the consensus statement: Guidelines for Management of Incidental Pulmonary Nodules Detected on CT Images: From the Fleischner Society 2017; Radiology 2017; 284:228-243. Aortic valve calcification. Echocardiography may be helpful to assess the degree of valvular dysfunction. Progressive marked atrophy of the right hepatic lobe, likely related to chronic biliary obstruction, incompletely evaluated on this examination. This was better assessed on CT  examination of 05/02/2009. Emphysema (ICD10-J43.9). Electronically Signed   By: Helyn Numbers M.D.   On: 04/29/2021 01:26   ECHOCARDIOGRAM COMPLETE  Result Date: 04/29/2021    ECHOCARDIOGRAM REPORT   Patient Name:   Crystal Silva Date of Exam: 04/29/2021 Medical Rec #:  [id number]     Height:       69.0 in Accession #:    [id number]    Weight:       163.3 lb Date of Birth:  1961-07-01    BSA:          1.895 m Patient Age:    59 years      BP:           137/73 mmHg Patient Gender: F             HR:           70 bpm. [facility] Procedure: 2D Echo, Cardiac Doppler and Color Doppler Indications:    Aortic valve disorder  History:        Patient has no prior history of Echocardiogram examinations.                 COPD, Signs/Symptoms:Chest Pain; Risk Factors:Tobacco use.  Sonographer:    Mikki Harbor Referring Phys: [id number] ASIA B ZIERLE-GHOSH IMPRESSIONS  1. Left ventricular ejection fraction, by estimation, is 70 to 75%. The left ventricle has hyperdynamic function. The left ventricle has no regional wall motion abnormalities. Left ventricular diastolic parameters were normal.  2. Right ventricular systolic function is normal. The right ventricular size is normal. There is mildly elevated pulmonary artery systolic pressure.  3. The mitral valve is normal in structure. No evidence of mitral valve regurgitation. No evidence of mitral stenosis.  4. The aortic valve was not well visualized. There is moderate calcification of the aortic valve. There is moderate thickening of the aortic valve. Aortic valve regurgitation is not visualized. Severe aortic valve stenosis. Aortic valve mean gradient measures 40.2 mmHg. Aortic valve peak gradient measures 64.0 mmHg. Aortic valve area, by VTI measures 0.84 cm.     DI 0.27  5. The inferior vena cava is normal in size with greater than 50% respiratory variability, suggesting right atrial pressure of 3 mmHg. FINDINGS  Left Ventricle: Left ventricular ejection  fraction, by estimation, is 70 to 75%. The left ventricle has hyperdynamic function. The left ventricle has no regional wall motion abnormalities. The left ventricular internal cavity size was normal in size. There is no left ventricular hypertrophy. Left ventricular diastolic parameters were normal. Right Ventricle: The right ventricular size is normal. No increase in right ventricular wall thickness. Right ventricular systolic function is normal. There is mildly elevated pulmonary artery systolic pressure. The tricuspid regurgitant velocity is 2.69  m/s, and with an assumed right atrial pressure of 8 mmHg, the estimated right ventricular systolic pressure is 36.9 mmHg. Left Atrium: Left atrial size was normal in size. Right Atrium: Right atrial size was normal in size. Pericardium: There is no evidence of  pericardial effusion. Mitral Valve: The mitral valve is normal in structure. No evidence of mitral valve regurgitation. No evidence of mitral valve stenosis. MV peak gradient, 6.2 mmHg. The mean mitral valve gradient is 2.0 mmHg. Tricuspid Valve: The tricuspid valve is normal in structure. Tricuspid valve regurgitation is mild . No evidence of tricuspid stenosis. Aortic Valve: The aortic valve was not well visualized. There is moderate calcification of the aortic valve. There is moderate thickening of the aortic valve. There is moderate aortic valve annular calcification. Aortic valve regurgitation is not visualized. Severe aortic stenosis is present. Aortic valve mean gradient measures 40.2 mmHg. Aortic valve peak gradient measures 64.0 mmHg. Aortic valve area, by VTI measures 0.84 cm. Pulmonic Valve: The pulmonic valve was not well visualized. Pulmonic valve regurgitation is not visualized. No evidence of pulmonic stenosis. Aorta: The aortic root is normal in size and structure. Venous: The inferior vena cava is normal in size with greater than 50% respiratory variability, suggesting right atrial pressure of 3  mmHg. IAS/Shunts: The interatrial septum was not well visualized.  LEFT VENTRICLE PLAX 2D LVIDd:         4.40 cm  Diastology LVIDs:         2.50 cm  LV e' medial:    9.14 cm/s LV PW:         0.80 cm  LV E/e' medial:  8.6 LV IVS:        0.90 cm  LV e' lateral:   10.10 cm/s LVOT diam:     2.00 cm  LV E/e' lateral: 7.8 LV SV:         80 LV SV Index:   42 LVOT Area:     3.14 cm  RIGHT VENTRICLE RV Basal diam:  2.65 cm RV Mid diam:    2.30 cm RV S prime:     14.80 cm/s TAPSE (M-mode): 2.6 cm LEFT ATRIUM             Index       RIGHT ATRIUM           Index LA diam:        3.10 cm 1.64 cm/m  RA Area:     11.70 cm LA Vol (A2C):   45.4 ml 23.95 ml/m RA Volume:   26.20 ml  13.82 ml/m LA Vol (A4C):   26.8 ml 14.14 ml/m LA Biplane Vol: 38.6 ml 20.36 ml/m  AORTIC VALVE AV Area (Vmax):    0.81 cm AV Area (Vmean):   0.81 cm AV Area (VTI):     0.84 cm AV Vmax:           400.00 cm/s AV Vmean:          289.970 cm/s AV VTI:            0.958 m AV Peak Grad:      64.0 mmHg AV Mean Grad:      40.2 mmHg LVOT Vmax:         103.00 cm/s LVOT Vmean:        74.800 cm/s LVOT VTI:          0.256 m LVOT/AV VTI ratio: 0.27  AORTA Ao Root diam: 3.20 cm Ao Asc diam:  3.00 cm MITRAL VALVE               TRICUSPID VALVE MV Area (PHT): 2.81 cm    TR Peak grad:   28.9 mmHg MV Area VTI:   2.62 cm  TR Vmax:        269.00 cm/s MV Peak grad:  6.2 mmHg MV Mean grad:  2.0 mmHg    SHUNTS MV Vmax:       1.24 m/s    Systemic VTI:  0.26 m MV Vmean:      65.6 cm/s   Systemic Diam: 2.00 cm MV Decel Time: 270 msec MV E velocity: 78.50 cm/s MV A velocity: 87.70 cm/s MV E/A ratio:  0.90 Dina Rich MD Electronically signed by Dina Rich MD Signature Date/Time: 04/29/2021/4:08:19 PM    Final         Scheduled Meds:  azithromycin  500 mg Oral Daily   heparin  5,000 Units Subcutaneous Q8H   insulin aspart  0-5 Units Subcutaneous QHS   insulin aspart  0-9 Units Subcutaneous TID WC   ipratropium-albuterol  3 mL Nebulization TID    methylPREDNISolone (SOLU-MEDROL) injection  40 mg Intravenous Q8H   mometasone-formoterol  2 puff Inhalation BID   sodium chloride flush  3 mL Intravenous Q12H   Continuous Infusions:  sodium chloride     sodium chloride     Followed by   sodium chloride       LOS: 1 day   Time spent= 35 mins    Jatoria Kneeland Joline Maxcy, MD [facility]  If 7PM-7AM, please contact night-coverage  04/30/2021, 10:37 AM

## 2021-04-30 NOTE — Progress Notes (Signed)
**Note De-identified Redina Zeller Obfuscation** EKG complete and placed in patient chart 

## 2021-05-01 ENCOUNTER — Inpatient Hospital Stay (HOSPITAL_COMMUNITY): Payer: Commercial Managed Care - PPO

## 2021-05-01 ENCOUNTER — Encounter (HOSPITAL_COMMUNITY): Payer: Self-pay | Admitting: Cardiology

## 2021-05-01 DIAGNOSIS — R079 Chest pain, unspecified: Secondary | ICD-10-CM | POA: Diagnosis not present

## 2021-05-01 DIAGNOSIS — R0600 Dyspnea, unspecified: Secondary | ICD-10-CM | POA: Diagnosis not present

## 2021-05-01 DIAGNOSIS — I350 Nonrheumatic aortic (valve) stenosis: Secondary | ICD-10-CM | POA: Diagnosis not present

## 2021-05-01 DIAGNOSIS — J449 Chronic obstructive pulmonary disease, unspecified: Secondary | ICD-10-CM

## 2021-05-01 LAB — CBC
HCT: 41.9 % (ref 36.0–46.0)
Hemoglobin: 14.2 g/dL (ref 12.0–15.0)
MCH: 32.3 pg (ref 26.0–34.0)
MCHC: 33.9 g/dL (ref 30.0–36.0)
MCV: 95.4 fL (ref 80.0–100.0)
Platelets: 138 10*3/uL — ABNORMAL LOW (ref 150–400)
RBC: 4.39 MIL/uL (ref 3.87–5.11)
RDW: 12.6 % (ref 11.5–15.5)
WBC: 7.6 10*3/uL (ref 4.0–10.5)
nRBC: 0 % (ref 0.0–0.2)

## 2021-05-01 LAB — BASIC METABOLIC PANEL
Anion gap: 8 (ref 5–15)
BUN: 17 mg/dL (ref 6–20)
CO2: 24 mmol/L (ref 22–32)
Calcium: 9 mg/dL (ref 8.9–10.3)
Chloride: 107 mmol/L (ref 98–111)
Creatinine, Ser: 0.66 mg/dL (ref 0.44–1.00)
GFR, Estimated: >60 mL/min (ref >60)
Glucose, Bld: 113 mg/dL — ABNORMAL HIGH (ref 70–99)
Potassium: 4.5 mmol/L (ref 3.5–5.1)
Sodium: 139 mmol/L (ref 135–145)

## 2021-05-01 LAB — GLUCOSE, CAPILLARY
Glucose-Capillary: 116 mg/dL — ABNORMAL HIGH (ref 70–99)
Glucose-Capillary: 137 mg/dL — ABNORMAL HIGH (ref 70–99)
Glucose-Capillary: 110 mg/dL — ABNORMAL HIGH (ref 70–99)
Glucose-Capillary: 109 mg/dL — ABNORMAL HIGH (ref 70–99)

## 2021-05-01 LAB — PULMONARY FUNCTION TEST
FEF 25-75 Pre: 0.54 L/sec
FEF2575-%Pred-Pre: 19 %
FEV1-%Pred-Pre: 43 %
FEV1-Pre: 1.34 L
FEV1FVC-%Pred-Pre: 60 %
FEV6-%Pred-Pre: 67 %
FEV6-Pre: 2.62 L
FEV6FVC-%Pred-Pre: 95 %
FVC-%Pred-Pre: 71 %
FVC-Pre: 2.84 L
Pre FEV1/FVC ratio: 47 %
Pre FEV6/FVC Ratio: 92 %

## 2021-05-01 LAB — MAGNESIUM: Magnesium: 2.3 mg/dL (ref 1.7–2.4)

## 2021-05-01 MED ORDER — IPRATROPIUM-ALBUTEROL 0.5-2.5 (3) MG/3ML IN SOLN: 3.0000 mL | Freq: Two times a day (BID) | RESPIRATORY_TRACT | Status: DC | PRN

## 2021-05-01 MED ORDER — IOHEXOL 350 MG/ML SOLN
100.0000 mL | Freq: Once | INTRAVENOUS | Status: AC | PRN
  Administered 2021-05-01: 100 mL via INTRAVENOUS

## 2021-05-01 MED ORDER — METOPROLOL TARTRATE 50 MG PO TABS
50.0000 mg | ORAL_TABLET | Freq: Once | ORAL | Status: AC
  Administered 2021-05-01: 50 mg via ORAL
  Filled 2021-05-01: qty 1

## 2021-05-01 MED ORDER — FUROSEMIDE 10 MG/ML IJ SOLN
20.0000 mg | Freq: Once | INTRAMUSCULAR | Status: AC
  Administered 2021-05-01: 20 mg via INTRAVENOUS
  Filled 2021-05-01: qty 2

## 2021-05-01 MED ORDER — IPRATROPIUM-ALBUTEROL 0.5-2.5 (3) MG/3ML IN SOLN
3.0000 mL | Freq: Two times a day (BID) | RESPIRATORY_TRACT | Status: DC
  Administered 2021-05-01: 3 mL via RESPIRATORY_TRACT
  Filled 2021-05-01: qty 3

## 2021-05-01 NOTE — Progress Notes (Signed)
PROGRESS NOTE    SHERITHA LOUIS  OZH:[id number] DOB: 06-Jun-1961 DOA: 04/28/2021 PCP: Patient, No Pcp Per (Inactive)   Brief Narrative:  60 year old female known past medical history, tobacco user-1 pack daily comes to the hospital with dyspnea on exertion which has progressed over the last month.  She was admitted to the hospital for COPD exacerbation.  Echocardiogram ordered due to new diagnosis of systolic murmur.  CTA chest was negative for pulmonary embolism, showed moderate centrilobular emphysema.  There was also 7 mm noncalcified pulmonary nodule in the right upper lobe and was recommended repeat CT scan in 6 to 12 months.  Echocardiogram showed severe aortic stenosis therefore cardiology arranging for transfer patient to [facility] for RHC/LHC.   Assessment & Plan:   Active Problems:   Chest pain   Tobacco use disorder   Elevated d-dimer   COPD (chronic obstructive pulmonary disease) (HCC)   Aortic valve disorder   COPD exacerbation (HCC)   DOE (dyspnea on exertion)   Severe aortic stenosis by prior echocardiogram  Acute respiratory distress secondary to COPD exacerbation Continues to to use tobacco. Seems to be improving, continue steroids inhalers and antibiotics. She has been weaned to room air.  Severe aortic stenosis Cardiology was consulted as she had a left and right heart cath which was unremarkable.  Except for an elevated EDP of pressure of 18 and a wedge pressure of 20. Rate controlled metoprolol. CT surgery has been consulted for aortic valve replacement. She was given 1 dose of IV Lasix for LV EDP elevated pressure  Pulmonary nodule, 7 mm noncalcified  We will need repeat CT scan in 6-12 months with outpatient PCP    DVT prophylaxis: heparin injection 5,000 Units Start: 04/30/21 2200 SCDs Start: 04/29/21 0405  Code Status: Full code Family Communication:     Dispo: The patient is from: Home              Anticipated d/c is to: Home               Patient currently is not medically stable to d/c.   Difficult to place patient No        Subjective: Shortness of breath is improved, denies any chest pain.  Still having some hacking cough  Examination: General exam: In no acute distress. Respiratory system: Good air movement and clear to auscultation. Cardiovascular system: S1 & S2 heard, RRR. No JVD. Gastrointestinal system: Abdomen is nondistended, soft and nontender.  Extremities: No pedal edema. Skin: No rashes, lesions or ulcers Psychiatry: Judgement and insight appear normal. Mood & affect appropriate.  Objective: Vitals:   05/01/21 0456 05/01/21 0839 05/01/21 0847 05/01/21 0937  BP: 104/90   110/60  Pulse: 84   69  Resp: 19     Temp: 97.8 F (36.6 C)     TempSrc: Axillary     SpO2: 97% 99% 99%   Weight: 71.7 kg     Height:        Intake/Output Summary (Last 24 hours) at 05/01/2021 1200 Last data filed at 05/01/2021 0800 Gross per 24 hour  Intake 279.67 ml  Output --  Net 279.67 ml    Filed Weights   04/30/21 0500 04/30/21 1536 05/01/21 0456  Weight: 71.4 kg 72.1 kg 71.7 kg     Data Reviewed:   CBC: Recent Labs  Lab 04/29/21 0007 04/29/21 0505 04/30/21 0507 04/30/21 1451 04/30/21 1452 04/30/21 1457 04/30/21 1501 05/01/21 0208  WBC 6.8 5.8 8.8  --   --   --   --  7.6  NEUTROABS 3.6  --   --   --   --   --   --   --   HGB 16.2* 15.1* 15.3* 13.9 13.6 13.6 13.3 14.2  HCT 49.2* 44.7 46.0 41.0 40.0 40.0 39.0 41.9  MCV 99.2 96.3 98.1  --   --   --   --  95.4  PLT 154 140* 170  --   --   --   --  138*    Basic Metabolic Panel: Recent Labs  Lab 04/29/21 0007 04/29/21 0505 04/30/21 0507 04/30/21 1451 04/30/21 1452 04/30/21 1457 04/30/21 1501 05/01/21 0208  NA 138 139 138 141 141 142 142 139  K 3.9 3.5 4.4 4.2 4.1 4.1 4.0 4.5  CL 106 106 105  --   --   --   --  107  CO2 28 26 25   --   --   --   --  24  GLUCOSE 100* 152* 146*  --   --   --   --  113*  BUN 15 14 18   --   --   --   --  17   CREATININE 0.71 0.56 0.65  --   --   --   --  0.66  CALCIUM 8.3* 8.5* 9.2  --   --   --   --  9.0  MG  --  2.0 2.2  --   --   --   --  2.3    GFR: Estimated Creatinine Clearance: 80.1 mL/min (by C-G formula based on SCr of 0.66 mg/dL). Liver Function Tests: Recent Labs  Lab 04/29/21 0505  AST 12*  ALT 12  ALKPHOS 44  BILITOT 0.7  PROT 6.3*  ALBUMIN 3.7    No results for input(s): LIPASE, AMYLASE in the last 168 hours. No results for input(s): AMMONIA in the last 168 hours. Coagulation Profile: No results for input(s): INR, PROTIME in the last 168 hours. Cardiac Enzymes: No results for input(s): CKTOTAL, CKMB, CKMBINDEX, TROPONINI in the last 168 hours. BNP (last 3 results) No results for input(s): PROBNP in the last 8760 hours. HbA1C: No results for input(s): HGBA1C in the last 72 hours. CBG: Recent Labs  Lab 04/30/21 0735 04/30/21 1117 04/30/21 1729 04/30/21 2107 05/01/21 0749  GLUCAP 126* 129* 127* 90 116*    Lipid Profile: No results for input(s): CHOL, HDL, LDLCALC, TRIG, CHOLHDL, LDLDIRECT in the last 72 hours. Thyroid Function Tests: No results for input(s): TSH, T4TOTAL, FREET4, T3FREE, THYROIDAB in the last 72 hours. Anemia Panel: No results for input(s): VITAMINB12, FOLATE, FERRITIN, TIBC, IRON, RETICCTPCT in the last 72 hours. Sepsis Labs: No results for input(s): PROCALCITON, LATICACIDVEN in the last 168 hours.  Recent Results (from the past 240 hour(s))  Resp Panel by RT-PCR (Flu A&B, Covid) Nasopharyngeal Swab     Status: None   Collection Time: 04/29/21 12:05 AM   Specimen: Nasopharyngeal Swab; Nasopharyngeal(NP) swabs in vial transport medium  Result Value Ref Range Status   SARS Coronavirus 2 by RT PCR NEGATIVE NEGATIVE Final    Comment: (NOTE) SARS-CoV-2 target nucleic acids are NOT DETECTED.  The SARS-CoV-2 RNA is generally detectable in upper respiratory specimens during the acute phase of infection. The lowest concentration of  SARS-CoV-2 viral copies this assay can detect is 138 copies/mL. A negative result does not preclude SARS-Cov-2 infection and should not be used as the sole basis for treatment or other patient management decisions. A negative result may occur with  improper specimen collection/handling, submission of specimen other than nasopharyngeal swab, presence of viral mutation(s) within the areas targeted by this assay, and inadequate number of viral copies(<138 copies/mL). A negative result must be combined with clinical observations, patient history, and epidemiological information. The expected result is Negative.  Fact Sheet for Patients:  BloggerCourse.com  Fact Sheet for Healthcare Providers:  SeriousBroker.it  This test is no t yet approved or cleared by the [place] FDA and  has been authorized for detection and/or diagnosis of SARS-CoV-2 by FDA under an Emergency Use Authorization (EUA). This EUA will remain  in effect (meaning this test can be used) for the duration of the COVID-19 declaration under Section 564(b)(1) of the Act, 21 U.S.C.section 360bbb-3(b)(1), unless the authorization is terminated  or revoked sooner.       Influenza A by PCR NEGATIVE NEGATIVE Final   Influenza B by PCR NEGATIVE NEGATIVE Final    Comment: (NOTE) The Xpert Xpress SARS-CoV-2/FLU/RSV plus assay is intended as an aid in the diagnosis of influenza from Nasopharyngeal swab specimens and should not be used as a sole basis for treatment. Nasal washings and aspirates are unacceptable for Xpert Xpress SARS-CoV-2/FLU/RSV testing.  Fact Sheet for Patients: BloggerCourse.com  Fact Sheet for Healthcare Providers: SeriousBroker.it  This test is not yet approved or cleared by the [place] FDA and has been authorized for detection and/or diagnosis of SARS-CoV-2 by FDA under an Emergency Use  Authorization (EUA). This EUA will remain in effect (meaning this test can be used) for the duration of the COVID-19 declaration under Section 564(b)(1) of the Act, 21 U.S.C. section 360bbb-3(b)(1), unless the authorization is terminated or revoked.  Performed at [facility], [address], [place], [place] [address]           Radiology Studies: CARDIAC CATHETERIZATION  Result Date: 04/30/2021   LV end diastolic pressure is moderately elevated.   Angiographically normal coronary arteries   Moderate Aortic Stenosis by Cath Gradient, Severe Aortic Stenosis by Echo SUMMARY Angiographically normal coronary arteries. Moderate Aortic Stenosis by cardiac cath, Severe Aortic Stenosis  by Echo. Moderately elevated LVEDP and PCWP with normal PAP. RECOMMENDATIONS Return to nursing unit for ongoing care. Anticipate TAVR team consultation.   VAS US DOPPLER PRE CABG  Result Date: 05/01/2021 PREOPERATIVE VASCULAR EVALUATION Patient Name:  MARVYL EBEL  Date of Exam:   05/01/2021 Medical Rec #: [id number]      Accession #:    [id number] Date of Birth: 1961-03-21     Patient Gender: F Patient Age:   51 years Exam Location:  [facility] Procedure:      VAS US DOPPLER PRE CABG Referring Phys: Palisade O'NEAL --------------------------------------------------------------------------------  Indications:      Pre-AVR. Risk Factors:     Current smoker. Other Factors:    Severe aortic stenosis. Comparison Study: No prior studies. Performing Technologist: Jean Rosenthal RDMS, RVT  Examination Guidelines: A complete evaluation includes B-mode imaging, spectral Doppler, color Doppler, and power Doppler as needed of all accessible portions of each vessel. Bilateral testing is considered an integral part of a complete examination. Limited examinations for reoccurring indications may be performed as noted.  Right Carotid Findings: +----------+--------+--------+--------+--------+------------------+           PSV  cm/sEDV cm/sStenosisDescribeComments           +----------+--------+--------+--------+--------+------------------+ CCA Prox  71      19                                         +----------+--------+--------+--------+--------+------------------+  CCA Distal81      26                                         +----------+--------+--------+--------+--------+------------------+ ICA Prox  65      21                      intimal thickening +----------+--------+--------+--------+--------+------------------+ ICA Distal83      34                                         +----------+--------+--------+--------+--------+------------------+ ECA       91      14                                         +----------+--------+--------+--------+--------+------------------+ +----------+--------+-------+----------------+------------+           PSV cm/sEDV cmsDescribe        Arm Pressure +----------+--------+-------+----------------+------------+ Subclavian169            Multiphasic, WNL             +----------+--------+-------+----------------+------------+ +---------+--------+--+--------+--+---------+ VertebralPSV cm/s67EDV cm/s22Antegrade +---------+--------+--+--------+--+---------+ Left Carotid Findings: +----------+--------+--------+--------+--------+------------------+           PSV cm/sEDV cm/sStenosisDescribeComments           +----------+--------+--------+--------+--------+------------------+ CCA Prox  94      21                                         +----------+--------+--------+--------+--------+------------------+ CCA Distal75      18                                         +----------+--------+--------+--------+--------+------------------+ ICA Prox  49      19                      intimal thickening +----------+--------+--------+--------+--------+------------------+ ICA Distal53      23                                          +----------+--------+--------+--------+--------+------------------+ ECA       94      17                                         +----------+--------+--------+--------+--------+------------------+ +----------+--------+--------+----------------+------------+ SubclavianPSV cm/sEDV cm/sDescribe        Arm Pressure +----------+--------+--------+----------------+------------+           121             Multiphasic, WNL             +----------+--------+--------+----------------+------------+ +---------+--------+--+--------+--+---------+ VertebralPSV cm/s33EDV cm/s12Antegrade +---------+--------+--+--------+--+---------+  ABI Findings: +--------+------------------+-----+---------+--------+ Right   Rt Pressure (mmHg)IndexWaveform Comment  +--------+------------------+-----+---------+--------+ Brachial  triphasic         +--------+------------------+-----+---------+--------+ +--------+------------------+-----+---------+-------+ Left    Lt Pressure (mmHg)IndexWaveform Comment +--------+------------------+-----+---------+-------+ Brachial                       triphasic        +--------+------------------+-----+---------+-------+  Right Doppler Findings: +--------+--------+-----+---------+--------+ Site    PressureIndexDoppler  Comments +--------+--------+-----+---------+--------+ Brachial             triphasic         +--------+--------+-----+---------+--------+ Radial               triphasic         +--------+--------+-----+---------+--------+ Ulnar                triphasic         +--------+--------+-----+---------+--------+  Left Doppler Findings: +--------+--------+-----+---------+--------+ Site    PressureIndexDoppler  Comments +--------+--------+-----+---------+--------+ Brachial             triphasic         +--------+--------+-----+---------+--------+ Radial               triphasic          +--------+--------+-----+---------+--------+ Ulnar                biphasic          +--------+--------+-----+---------+--------+  Summary: Right Carotid: The extracranial vessels were near-normal with only minimal wall                thickening or plaque. Left Carotid: The extracranial vessels were near-normal with only minimal wall               thickening or plaque. Vertebrals:  Bilateral vertebral arteries demonstrate antegrade flow. Subclavians: Normal flow hemodynamics were seen in bilateral subclavian              arteries. Right Upper Extremity: Doppler waveform obliterate with right radial compression. Doppler waveforms remain within normal limits with right ulnar compression.  Left Upper Extremity: Doppler waveforms remain within normal limits with left radial compression. Doppler waveforms remain within normal limits with left ulnar compression.     Preliminary         Scheduled Meds:  azithromycin  500 mg Oral Daily   heparin  5,000 Units Subcutaneous Q8H   insulin aspart  0-5 Units Subcutaneous QHS   insulin aspart  0-9 Units Subcutaneous TID WC   methylPREDNISolone (SOLU-MEDROL) injection  40 mg Intravenous Q8H   mometasone-formoterol  2 puff Inhalation BID   sodium chloride flush  3 mL Intravenous Q12H   Continuous Infusions:  sodium chloride       LOS: 2 days   Time spent= 35 mins    Marinda Elk, MD [facility]  If 7PM-7AM, please contact night-coverage  05/01/2021, 12:00 PM

## 2021-05-01 NOTE — Progress Notes (Signed)
Pre-AVR study completed.   Please see CV Proc for preliminary results.   Deziah Renwick, RDMS, RVT  

## 2021-05-01 NOTE — H&P (View-Only) (Signed)
[address]       [place] [address]             [phone]        VITA CURRIN [facility] Medical Record #[id number] Date of Birth: 05-Nov-1960  Referring: Lennie Odor, MD Primary Care: Patient, No Pcp Per (Inactive) Primary Cardiologist:Branch, Christiane Ha, MD  Reason for consult:  Evaluation for surgical management of severe aortic stenosis   History of Present Illness:     [name] is a 60 year old female 55 pack-year tobacco smoker with no significant past medical history who presented to the emergency room on 04/28/2021 with dyspnea on exertion and sensation of her heart racing.  She described having similar symptoms over the past year but these have been more frequent and intense over the past 6 weeks. Work up in the emergency room included an EKG that showed normal sinus rhythm with no ischemic changes.  Serial troponins were 3 and 3.  BNP was 37.  CT angiogram of the chest demonstrated significant aortic valve calcification, no coronary calcification.  There was a 7 mm right upper lobe nodule without lymphadenopathy for which repeat CT is recommended in 6 to 12 months was recommended.  She was also noted to have evidence of emphysema on CT scan.  Physical exam was notable for a prominent systolic murmur.  She was admitted to the hospital for further work-up.  An echocardiogram was obtained on 04/29/2021 confirmed severe aortic stenosis.  Left ventricular ejection fraction was estimated at 70 to 75%.  RV function was normal.  There was slight elevation in the EDP and PA systolic pressures.  The mean aortic valve gradient was measured at 40.2 mmHg with a peak gradient of 64 mmHg.  Calculated aortic valve area was 0.84 cm. Left heart catheterization was performed yesterday which demonstrated significant no obstructive coronary artery disease.  The patient was felt to be symptomatic from both chronic obstructive pulmonary disease and the aortic stenosis.   She has been treated with diuresis, oral steroids, and inhaled  bronchodilators and  has improved symptomatically.  TAVR CT's and an orthopantogram were performed earlier today, results are pending.  Crystal Silva denies any other medical problems and has had no other surgery. She works full-time which Engineer, civil (consulting) and she says she has the option to work from home. She has a full upper plate denture. Her remaining 6 lower teeth are in good repair.  Last dental visit was 2 years ago.   We are asked to evaluate Ms. Lampkins for consideration of aortic valve replacement.  Current Activity/ Functional Status:   Zubrod Score: At the time of surgery this patient's most appropriate activity status/level should be described as:     0    Normal activity, no symptoms     1    Restricted in physical strenuous activity but ambulatory, able to do out light work     2    Ambulatory and capable of self care, unable to do work activities, up and about                 more than 50%  Of the time                                3    Only limited self care, in bed greater than 50% of waking hours   4    Completely disabled, no self care, confined to bed or chair     5    Moribund  History reviewed. No pertinent past medical history.  Past Surgical History:  Procedure Laterality Date   CHOLECYSTECTOMY     lt knee surgery     RIGHT/LEFT HEART CATH AND CORONARY ANGIOGRAPHY N/A 04/30/2021   Procedure: RIGHT/LEFT HEART CATH AND CORONARY ANGIOGRAPHY;  Surgeon: Marykay Lex, MD;  Location: [facility] INVASIVE CV LAB;  Service: Cardiovascular;  Laterality: N/A;    Social History   Tobacco Use  Smoking Status Every Day   Packs/day: 1.00   Types: Cigarettes  Smokeless Tobacco Never    Social History   Substance and Sexual Activity  Alcohol Use No     No Known Allergies  Current Facility-Administered Medications  Medication Dose Route Frequency Provider Last Rate Last Admin   0.9  %  sodium chloride infusion  250 mL Intravenous PRN Marykay Lex, MD       acetaminophen (TYLENOL) tablet 650 mg  650 mg Oral Q6H PRN Marykay Lex, MD   650 mg at 04/29/21 1857   Or   acetaminophen (TYLENOL) suppository 650 mg  650 mg Rectal Q6H PRN Marykay Lex, MD       albuterol (PROVENTIL) (2.5 MG/3ML) 0.083% nebulizer solution 2.5 mg  2.5 mg Nebulization Q2H PRN Marykay Lex, MD       azithromycin [facility]) tablet 500 mg  500 mg Oral Daily Marykay Lex, MD   500 mg at 05/01/21 1610   furosemide (LASIX) injection 20 mg  20 mg Intravenous Once Sande Rives, MD       heparin injection 5,000 Units  5,000 Units Subcutaneous Q8H Marykay Lex, MD   5,000 Units at 05/01/21 0458   hydrALAZINE (APRESOLINE) injection 10 mg  10 mg Intravenous Q4H PRN Marykay Lex, MD       HYDROcodone-acetaminophen (NORCO/VICODIN) 5-325 MG per tablet 1 tablet  1 tablet Oral Q4H PRN Marykay Lex, MD   1 tablet at 04/29/21 2141   insulin aspart (novoLOG) injection 0-5 Units  0-5 Units Subcutaneous QHS Marykay Lex, MD       insulin aspart (novoLOG) injection 0-9 Units  0-9 Units Subcutaneous TID WC Marykay Lex, MD   1 Units at 04/30/21 1757   ipratropium-albuterol (DUONEB) 0.5-2.5 (3) MG/3ML nebulizer solution 3 mL  3 mL Nebulization BID PRN Marinda Elk, MD       methylPREDNISolone sodium succinate (SOLU-MEDROL) 40 mg/mL injection 40 mg  40 mg Intravenous Q8H Marykay Lex, MD   40 mg at 05/01/21 0458   mometasone-formoterol (DULERA) 200-5 MCG/ACT inhaler 2 puff  2 puff Inhalation BID Marykay Lex, MD   2 puff at 05/01/21 0847   morphine 2 MG/ML injection 2 mg  2 mg Intravenous Q2H PRN Marykay Lex, MD       ondansetron [facility]) tablet 4 mg  4 mg Oral Q6H PRN Marykay Lex, MD       Or   ondansetron [facility]) injection 4 mg  4 mg Intravenous Q6H PRN Marykay Lex, MD       senna-docusate (Senokot-S) tablet 1 tablet  1 tablet Oral QHS PRN Marykay Lex, MD       sodium chloride flush (NS) 0.9 % injection 3 mL  3 mL Intravenous Q12H Marykay Lex, MD   3 mL at 04/30/21 2116   sodium  chloride flush (NS) 0.9 % injection 3 mL  3 mL Intravenous PRN Marykay Lex, MD       traZODone (DESYREL) tablet 50 mg  50 mg Oral QHS PRN Marykay Lex, MD        Medications Prior to Admission  Medication Sig Dispense Refill Last Dose   azithromycin (ZITHROMAX) 250 MG tablet Take 1 tablet (250 mg total) by mouth daily. Take first 2 tablets together, then 1 every day until finished. (Patient not taking: No sig reported) 6 tablet 0 Completed Course   desloratadine (CLARINEX) 5 MG tablet Take 1 tablet (5 mg total) by mouth daily as needed. (Patient not taking: No sig reported) 10 tablet 0 Not Taking   HYDROcodone-acetaminophen (NORCO/VICODIN) 5-325 MG per tablet Take 1 tablet by mouth every 4 (four) hours as needed. (Patient not taking: No sig reported) 15 tablet 0 Completed Course   ibuprofen (ADVIL,MOTRIN) 600 MG tablet Take 1 tablet (600 mg total) by mouth every 6 (six) hours as needed for pain. (Patient not taking: No sig reported) 30 tablet 0 Not Taking   predniSONE (STERAPRED UNI-PAK 21 TAB) 10 MG (21) TBPK tablet Take 6 tabs by mouth daily  for 1 days, then 5 tabs for 1 days, then 4 tabs for 1 days, then 3 tabs for 1 days, 2 tabs for 1 days, then 1 tab by mouth daily for 1 days (Patient not taking: No sig reported) 21 tablet 0 Completed Course    Family History  Problem Relation Age of Onset   Heart failure Mother    Heart disease Mother        stent     Review of Systems:   ROS     Cardiac Review of Systems: Y or  [    ]= no  Chest Pain [    ]  Resting SOB [   ] Exertional SOB  [ x ]  Orthopnea [  ]   Pedal Edema [  x ]    Palpitations [ x ] Syncope  [  ]   Presyncope [   ]  General Review of Systems: [Y] = yes [  ]=no Constitional: recent weight change [  ]; anorexia [  ]; fatigue [ x ]; nausea [  ]; night sweats [  ]; fever [   ]; or chills [  ]                                                               Dental: Last Dentist visit: 2 years ago  Eye : blurred vision [  ]; diplopia [   ]; vision changes [  ];  Amaurosis fugax[  ]; Resp: cough [  ];  wheezing[  ];  hemoptysis[  ]; shortness of breath[ x ]; paroxysmal nocturnal dyspnea[  ]; dyspnea on exertion[ x ]; or orthopnea[  ];  GI:  gallstones[  ], vomiting[  ];  dysphagia[  ]; melena[  ];  hematochezia [  ]; heartburn[  ];   Hx of  Colonoscopy[  ]; GU: kidney stones [  ]; hematuria[  ];   dysuria [  ];  nocturia[  ];  history of     obstruction [  ]; urinary frequency [  ]  Skin: rash, swelling[  ];, hair loss[  ];  peripheral edema[  ];  or itching[  ]; Musculosketetal: myalgias[  ];  joint swelling[  ];  joint erythema[  ];  joint pain[  ];  back pain[  ];  Heme/Lymph: bruising[  ];  bleeding[  ];  anemia[  ];  Neuro: TIA[  ];  headaches[  ];  stroke[  ];  vertigo[  ];  seizures[  ];   paresthesias[  ];  difficulty walking[  ];  Psych:depression[  ]; anxiety[  ];  Endocrine: diabetes[  ];  thyroid dysfunction[  ]      Physical Exam: BP 110/60   Pulse 69   Temp 97.8 F (36.6 C) (Axillary)   Resp 19   Ht 5\' 9"  (1.753 m)   Wt 71.7 kg   LMP 12/02/2013   SpO2 99%   BMI 23.34 kg/m    General appearance: alert, cooperative, and mildly anxious but in no distress Head: Normocephalic, without obvious abnormality, atraumatic Neck: no adenopathy, no carotid bruit, no JVD, and supple, symmetrical, trachea midline Lymph nodes: No cervical or clavicular adenopathy Resp: breath sounds distant but clear. Chest has increased AP diameter.  Cardio: RRR, prominent systolic murmur GI: soft and non-tender Extremities: thin extremities without deformity and easily palpable distal pulses.  No peripheral edema. Neurologic: Grossly normal   Recent Radiology Findings:   CLINICAL DATA:  Shortness of breath   EXAM: CHEST - 2 VIEW   COMPARISON:   04/06/2020   FINDINGS: Hyperinflated lungs. The heart size and mediastinal contours are within normal limits. No focal pulmonary opacity. No pleural effusion or pneumothorax. No acute osseous abnormality. Right upper quadrant surgical clips.   IMPRESSION: No acute cardiopulmonary disease.     Electronically Signed   By: 04/08/2020 M.D.   On: 04/28/2021 22:27   Diagnostic Studies & Laboratory data:      ECHOCARDIOGRAM REPORT     Patient Name:   FATISHA RABALAIS Date of Exam: 04/29/2021  Medical Rec #:  [id number]/01/2021     Height:       69.0 in  Accession #:    [id number]    Weight:       163.3 lb  Date of Birth:  1961-04-13    BSA:          1.895 m  Patient Age:    59 years      BP:           137/73 mmHg  Patient Gender: F             HR:           70 bpm.  Exam Location:  06/11/1962   Procedure: 2D Echo, Cardiac Doppler and Color Doppler   Indications:    Aortic valve disorder     History:        Patient has no prior history of Echocardiogram  examinations.                  COPD, Signs/Symptoms:Chest Pain; Risk Factors:Tobacco use.     Sonographer:    Jeani Hawking  Referring Phys: Mikki Harbor ASIA B ZIERLE-GHOSH   IMPRESSIONS     1. Left ventricular ejection fraction, by estimation, is 70 to 75%. The  left ventricle has hyperdynamic function. The left ventricle has no  regional wall motion abnormalities. Left ventricular diastolic parameters  were normal.   2. Right ventricular systolic function is normal. The right ventricular  size is normal. There is mildly elevated pulmonary artery systolic  pressure.   3. The mitral valve is normal in structure. No evidence of mitral valve  regurgitation. No evidence of mitral stenosis.   4. The aortic valve was not well visualized. There is moderate  calcification of the aortic valve. There is moderate thickening of the  aortic valve. Aortic valve regurgitation is not visualized. Severe aortic  valve stenosis. Aortic valve mean  gradient  measures 40.2 mmHg. Aortic valve peak gradient measures 64.0 mmHg. Aortic  valve area, by VTI measures 0.84 cm.      DI 0.27   5. The inferior vena cava is normal in size with greater than 50%  respiratory variability, suggesting right atrial pressure of 3 mmHg.   FINDINGS   Left Ventricle: Left ventricular ejection fraction, by estimation, is 70  to 75%. The left ventricle has hyperdynamic function. The left ventricle  has no regional wall motion abnormalities. The left ventricular internal  cavity size was normal in size.  There is no left ventricular hypertrophy. Left ventricular diastolic  parameters were normal.   Right Ventricle: The right ventricular size is normal. No increase in  right ventricular wall thickness. Right ventricular systolic function is  normal. There is mildly elevated pulmonary artery systolic pressure. The  tricuspid regurgitant velocity is 2.69   m/s, and with an assumed right atrial pressure of 8 mmHg, the estimated  right ventricular systolic pressure is 36.9 mmHg.   Left Atrium: Left atrial size was normal in size.   Right Atrium: Right atrial size was normal in size.   Pericardium: There is no evidence of pericardial effusion.   Mitral Valve: The mitral valve is normal in structure. No evidence of  mitral valve regurgitation. No evidence of mitral valve stenosis. MV peak  gradient, 6.2 mmHg. The mean mitral valve gradient is 2.0 mmHg.   Tricuspid Valve: The tricuspid valve is normal in structure. Tricuspid  valve regurgitation is mild . No evidence of tricuspid stenosis.   Aortic Valve: The aortic valve was not well visualized. There is moderate  calcification of the aortic valve. There is moderate thickening of the  aortic valve. There is moderate aortic valve annular calcification. Aortic  valve regurgitation is not  visualized. Severe aortic stenosis is present. Aortic valve mean gradient  measures 40.2 mmHg. Aortic valve peak  gradient measures 64.0 mmHg. Aortic  valve area, by VTI measures 0.84 cm.   Pulmonic Valve: The pulmonic valve was not well visualized. Pulmonic valve  regurgitation is not visualized. No evidence of pulmonic stenosis.   Aorta: The aortic root is normal in size and structure.   Venous: The inferior vena cava is normal in size with greater than 50%  respiratory variability, suggesting right atrial pressure of 3 mmHg.   IAS/Shunts: The interatrial septum was not well visualized.      LEFT VENTRICLE  PLAX 2D  LVIDd:         4.40 cm  Diastology  LVIDs:         2.50 cm  LV e' medial:    9.14 cm/s  LV PW:         0.80 cm  LV E/e' medial:  8.6  LV IVS:        0.90 cm  LV e' lateral:   10.10 cm/s  LVOT diam:     2.00 cm  LV E/e' lateral: 7.8  LV SV:         80  LV SV Index:   42  LVOT Area:     3.14 cm      RIGHT VENTRICLE  RV Basal diam:  2.65 cm  RV Mid diam:    2.30 cm  RV S prime:     14.80 cm/s  TAPSE (M-mode): 2.6 cm   LEFT ATRIUM             Index       RIGHT ATRIUM           Index  LA diam:        3.10 cm 1.64 cm/m  RA Area:     11.70 cm  LA Vol (A2C):   45.4 ml 23.95 ml/m RA Volume:   26.20 ml  13.82 ml/m  LA Vol (A4C):   26.8 ml 14.14 ml/m  LA Biplane Vol: 38.6 ml 20.36 ml/m   AORTIC VALVE  AV Area (Vmax):    0.81 cm  AV Area (Vmean):   0.81 cm  AV Area (VTI):     0.84 cm  AV Vmax:           400.00 cm/s  AV Vmean:          289.970 cm/s  AV VTI:            0.958 m  AV Peak Grad:      64.0 mmHg  AV Mean Grad:      40.2 mmHg  LVOT Vmax:         103.00 cm/s  LVOT Vmean:        74.800 cm/s  LVOT VTI:          0.256 m  LVOT/AV VTI ratio: 0.27     AORTA  Ao Root diam: 3.20 cm  Ao Asc diam:  3.00 cm   MITRAL VALVE               TRICUSPID VALVE  MV Area (PHT): 2.81 cm    TR Peak grad:   28.9 mmHg  MV Area VTI:   2.62 cm    TR Vmax:        269.00 cm/s  MV Peak grad:  6.2 mmHg  MV Mean grad:  2.0 mmHg    SHUNTS  MV Vmax:       1.24 m/s    Systemic VTI:   0.26 m  MV Vmean:      65.6 cm/s   Systemic Diam: 2.00 cm  MV Decel Time: 270 msec  MV E velocity: 78.50 cm/s  MV A velocity: 87.70 cm/s  MV E/A ratio:  0.90   Dina Rich MD  Electronically signed by Dina Rich MD  Signature Date/Time: 04/29/2021/4:08:19 PM      RIGHT/LEFT HEART CATH    Conclusion      LV end diastolic pressure is moderately elevated.   Angiographically normal coronary arteries   Moderate Aortic Stenosis by Cath Gradient, Severe Aortic Stenosis by Echo   SUMMARY Angiographically normal coronary arteries. Moderate Aortic Stenosis by cardiac cath, Severe Aortic Stenosis  by Echo. Moderately elevated LVEDP and PCWP with normal PAP.     RECOMMENDATIONS Return to nursing unit for ongoing care. Anticipate TAVR team consultation.  Coronary Findings  Diagnostic Dominance: Right Left Main  Vessel was injected. Vessel is large. Vessel is angiographically normal.  Left Anterior Descending  Vessel was injected. Vessel is normal in caliber. Vessel is angiographically normal.  Third Diagonal Branch  Vessel was injected. Vessel is small in size. Vessel is  angiographically normal.  Left Circumflex  Vessel was injected. Vessel is large. Vessel is angiographically normal.  First Obtuse Marginal Branch  Vessel was injected. Vessel is large in size. Vessel is angiographically normal.  Right Coronary Artery  Vessel was injected. Vessel is normal in caliber. Vessel is angiographically normal.  Acute Marginal Branch  Vessel was injected. Vessel is small in size. Vessel is angiographically normal.  Right Posterior Atrioventricular Artery  Vessel was injected. Vessel is moderate in size. Vessel is angiographically normal.  Third Right Posterolateral Branch  Vessel was injected. Vessel is small in size. Vessel is angiographically normal.  Intervention  No interventions have been documented. Right Heart  Right Heart Pressures PAP 29/14 mmHg-mean 22 mmHg PCWP  mean 20 mmHg with V wave of 26 mmHg.  Elevated LV EDP consistent with volume overload. LV P-EDP: 149/9 mmHg-18 mmHg AoP-MAP: 124/72 mmHg - 95 mmHg.  Ao sat 98%, PA sat 81%: Cardiac Output-Index (Fick) 7.0.-3.6  Right Atrium The right atrial size is normal. Right atrial pressure is elevated. Mildly elevated: RAP 10 mmHg  Right Ventricle RVP-EDP: 35/5 mmHg - 12 mmHg.   Wall Motion  No LV gram         Left Heart  Left Ventricle LV end diastolic pressure is moderately elevated.  Aortic Valve There is moderate aortic valve stenosis. By Cath Lab gradients moderate, by echo gradient severe. Peak to peak gradient 25 mmHg, mean 22 mmHg with a velocity of 284 m/s   Coronary Diagrams   Diagnostic Dominance: Right       VAS US DOPPLER PRE CABG  Result Date: 05/01/2021 PREOPERATIVE VASCULAR EVALUATION Patient Name:  MIONNA ADVINCULA  Date of Exam:   05/01/2021 Medical Rec #: [id number]      Accession #:    [id number] Date of Birth: 27-Feb-1961     Patient Gender: F Patient Age:   89 years Exam Location:  [facility] Procedure:      VAS US DOPPLER PRE CABG Referring Phys: [place] O'NEAL --------------------------------------------------------------------------------  Indications:      Pre-AVR. Risk Factors:     Current smoker. Other Factors:    Severe aortic stenosis. Comparison Study: No prior studies. Performing Technologist: Jean Rosenthal RDMS, RVT  Examination Guidelines: A complete evaluation includes B-mode imaging, spectral Doppler, color Doppler, and power Doppler as needed of all accessible portions of each vessel. Bilateral testing is considered an integral part of a complete examination. Limited examinations for reoccurring indications may be performed as noted.  Right Carotid Findings: +----------+--------+--------+--------+--------+------------------+           PSV cm/sEDV cm/sStenosisDescribeComments           +----------+--------+--------+--------+--------+------------------+  CCA Prox  71      19                                         +----------+--------+--------+--------+--------+------------------+ CCA Distal81      26                                         +----------+--------+--------+--------+--------+------------------+ ICA Prox  65      21                      intimal thickening +----------+--------+--------+--------+--------+------------------+ ICA Distal83      34                                         +----------+--------+--------+--------+--------+------------------+  ECA       91      14                                         +----------+--------+--------+--------+--------+------------------+ +----------+--------+-------+----------------+------------+           PSV cm/sEDV cmsDescribe        Arm Pressure +----------+--------+-------+----------------+------------+ Subclavian169            Multiphasic, WNL             +----------+--------+-------+----------------+------------+ +---------+--------+--+--------+--+---------+ VertebralPSV cm/s67EDV cm/s22Antegrade +---------+--------+--+--------+--+---------+ Left Carotid Findings: +----------+--------+--------+--------+--------+------------------+           PSV cm/sEDV cm/sStenosisDescribeComments           +----------+--------+--------+--------+--------+------------------+ CCA Prox  94      21                                         +----------+--------+--------+--------+--------+------------------+ CCA Distal75      18                                         +----------+--------+--------+--------+--------+------------------+ ICA Prox  49      19                      intimal thickening +----------+--------+--------+--------+--------+------------------+ ICA Distal53      23                                         +----------+--------+--------+--------+--------+------------------+ ECA       94      17                                          +----------+--------+--------+--------+--------+------------------+ +----------+--------+--------+----------------+------------+ SubclavianPSV cm/sEDV cm/sDescribe        Arm Pressure +----------+--------+--------+----------------+------------+           121             Multiphasic, WNL             +----------+--------+--------+----------------+------------+ +---------+--------+--+--------+--+---------+ VertebralPSV cm/s33EDV cm/s12Antegrade +---------+--------+--+--------+--+---------+  ABI Findings: +--------+------------------+-----+---------+--------+ Right   Rt Pressure (mmHg)IndexWaveform Comment  +--------+------------------+-----+---------+--------+ Brachial                       triphasic         +--------+------------------+-----+---------+--------+ +--------+------------------+-----+---------+-------+ Left    Lt Pressure (mmHg)IndexWaveform Comment +--------+------------------+-----+---------+-------+ Brachial                       triphasic        +--------+------------------+-----+---------+-------+  Right Doppler Findings: +--------+--------+-----+---------+--------+ Site    PressureIndexDoppler  Comments +--------+--------+-----+---------+--------+ Brachial             triphasic         +--------+--------+-----+---------+--------+ Radial               triphasic         +--------+--------+-----+---------+--------+ Ulnar  triphasic         +--------+--------+-----+---------+--------+  Left Doppler Findings: +--------+--------+-----+---------+--------+ Site    PressureIndexDoppler  Comments +--------+--------+-----+---------+--------+ Brachial             triphasic         +--------+--------+-----+---------+--------+ Radial               triphasic         +--------+--------+-----+---------+--------+ Ulnar                biphasic          +--------+--------+-----+---------+--------+  Summary:  Right Carotid: The extracranial vessels were near-normal with only minimal wall                thickening or plaque. Left Carotid: The extracranial vessels were near-normal with only minimal wall               thickening or plaque. Vertebrals:  Bilateral vertebral arteries demonstrate antegrade flow. Subclavians: Normal flow hemodynamics were seen in bilateral subclavian              arteries. Right Upper Extremity: Doppler waveform obliterate with right radial compression. Doppler waveforms remain within normal limits with right ulnar compression.  Left Upper Extremity: Doppler waveforms remain within normal limits with left radial compression. Doppler waveforms remain within normal limits with left ulnar compression.     Preliminary      I have independently reviewed the above radiologic studies and discussed with the patient   Recent Lab Findings: Lab Results  Component Value Date   WBC 7.6 05/01/2021   HGB 14.2 05/01/2021   HCT 41.9 05/01/2021   PLT 138 (L) 05/01/2021   GLUCOSE 113 (H) 05/01/2021   ALT 12 04/29/2021   AST 12 (L) 04/29/2021   NA 139 05/01/2021   K 4.5 05/01/2021   CL 107 05/01/2021   CREATININE 0.66 05/01/2021   BUN 17 05/01/2021   CO2 24 05/01/2021      Assessment / Plan:    -60 year old female with severe symptomatic aortic stenosis.  She feels she has had significant improvement in her symptoms since admission with the current medical regimen that is included diuresis with Lasix, IV Solu-Medrol, and oral antibiotics.  Surgical valve replacement is likely her best option for long-term management. Procedure and expected perioperative course were briefly discussed with Ms. Mcgruder and her questions were answered.  She would like for [place] to proceed with completing the necessary work-up to prepare for surgery.  Dr. Dorris Fetch will review all clinical data and will see Ms. Stlaurent later today.  Timing of surgery and selection of the appropriate valve prosthesis will be  addressed at that time.  -Suspected COPD-this given her 40 pack-year history of tobacco use and findings on CT scan.  Pulmonary function test will be obtained.  Patient strongly encouraged to avoid smoking between now and the time of surgery in order to optimize her pulmonary function.  -Right upper lobe pulmonary nodule-7 mm nodule, no obvious lymphadenopathy.  CT scanning is recommended in 6 to 12 months.   I  spent 25 minutes counseling the patient face to face.   Leary Roca, PA-C  05/01/2021 10:57 AM   Patient seen and examined, chart and echo images reviewed. 60 yo woman with a history of tobacco abuse, COPD, and right lung nodule.  Presented with shortness of breath. Symptoms improved dramatically with medical therapy. Noted to have a high pitched systolic murmur. Echo showed severe AS with valve area  0.84. Preserved EF. Cath showed normal coronaries. Interestingly gradient across Ao valve not as high at cath and c/w moderate AS/  Will discuss discrepancy between cath and echo with Cardiology, but exam and echo images are c/w severe AS.  Options include TAVR, surgical AVR with tissue or mechanical valve.  I thiink given her young age she would be best treated with surgical AVR.   Discussed advantages and disadvantages of tissue v mechanical valves with her. She understands the issues involved. She is very reluctant to take coumadin, She understands she would likely need additional interventions if she opts for a tissue valve.  I discussed the general nature of the procedure, including the need for general anesthesia, the incisions to be used, the use of cardiopulmonary bypass, and the need for drainage tubes and pacing wires postoperatively. We discussed the expected hospital stay, overall recovery and short and long term outcomes. I informed her of the indications, risks, benefits and alternatives.  She understands the risks include but are not limited to death, stroke, MI,  DVT/PE, bleeding, possible need for transfusion, infections, cardiac arrhythmias, heart block requiring pacemaker as well as other organ system dysfunction including respiratory, renal, or GI complications.   She accepts the risks and agrees to proceed.   She has few remaining teeth but has not seen a dentist in awhile.  She will make an appointment for a dental evaluation as outpatient  Also need PFTs as outpatient prior to surgery.  Salvatore Decent Dorris Fetch, MD [facility] [phone]

## 2021-05-01 NOTE — Progress Notes (Signed)
Cardiology Progress Note  Patient ID: [name] MRN: [id number] DOB: 31-Aug-1960 Date of Encounter: 05/01/2021  Primary Cardiologist: Dina Rich, MD  Subjective   Chief Complaint: SOB  HPI: SOB with exertion. Lungs improving.   ROS:  All other ROS reviewed and negative. Pertinent positives noted in the HPI.     Inpatient Medications  Scheduled Meds:  azithromycin  500 mg Oral Daily   heparin  5,000 Units Subcutaneous Q8H   insulin aspart  0-5 Units Subcutaneous QHS   insulin aspart  0-9 Units Subcutaneous TID WC   methylPREDNISolone (SOLU-MEDROL) injection  40 mg Intravenous Q8H   metoprolol tartrate  50 mg Oral Once   mometasone-formoterol  2 puff Inhalation BID   sodium chloride flush  3 mL Intravenous Q12H   Continuous Infusions:  sodium chloride     PRN Meds: sodium chloride, acetaminophen **OR** acetaminophen, albuterol, hydrALAZINE, HYDROcodone-acetaminophen, ipratropium-albuterol, morphine injection, ondansetron **OR** ondansetron (ZOFRAN) IV, senna-docusate, sodium chloride flush, traZODone   Vital Signs   Vitals:   04/30/21 2327 05/01/21 0456 05/01/21 0839 05/01/21 0847  BP: 115/68 104/90    Pulse: 73 84    Resp: 19 19    Temp: 98.1 F (36.7 C) 97.8 F (36.6 C)    TempSrc: Oral Axillary    SpO2: 95% 97% 99% 99%  Weight:  71.7 kg    Height:        Intake/Output Summary (Last 24 hours) at 05/01/2021 0936 Last data filed at 04/30/2021 1700 Gross per 24 hour  Intake 279.67 ml  Output --  Net 279.67 ml   Last 3 Weights 05/01/2021 04/30/2021 04/30/2021  Weight (lbs) 158 lb 1.1 oz 158 lb 14.4 oz 157 lb 4.8 oz  Weight (kg) 71.7 kg 72.077 kg 71.351 kg      Telemetry  Overnight telemetry shows SR 70s, which I personally reviewed.   ECG  The most recent ECG shows sinus rhythm, poor R wave progression, which I personally reviewed.   Physical Exam   Vitals:   04/30/21 2327 05/01/21 0456 05/01/21 0839 05/01/21 0847  BP: 115/68 104/90    Pulse:  73 84    Resp: 19 19    Temp: 98.1 F (36.7 C) 97.8 F (36.6 C)    TempSrc: Oral Axillary    SpO2: 95% 97% 99% 99%  Weight:  71.7 kg    Height:        Intake/Output Summary (Last 24 hours) at 05/01/2021 0936 Last data filed at 04/30/2021 1700 Gross per 24 hour  Intake 279.67 ml  Output --  Net 279.67 ml    Last 3 Weights 05/01/2021 04/30/2021 04/30/2021  Weight (lbs) 158 lb 1.1 oz 158 lb 14.4 oz 157 lb 4.8 oz  Weight (kg) 71.7 kg 72.077 kg 71.351 kg    Body mass index is 23.34 kg/m.   General: Well nourished, well developed, in no acute distress Head: Atraumatic, normal size  Eyes: PEERLA, EOMI  Neck: Supple, no JVD Endocrine: No thryomegaly Cardiac: Normal S1, S2; RRR; 3 out of 6 harsh systolic ejection murmur, late peaking, absent S2, consistent with severe aortic stenosis Lungs: Diminished breath sounds bilaterally Abd: Soft, nontender, no hepatomegaly  Ext: No edema, pulses 2+ Musculoskeletal: No deformities, BUE and BLE strength normal and equal Skin: Warm and dry, no rashes   Neuro: Alert and oriented to person, place, time, and situation, CNII-XII grossly intact, no focal deficits  Psych: Normal mood and affect   Labs  High Sensitivity Troponin:   Recent  Labs  Lab 04/29/21 0007 04/29/21 0229  TROPONINIHS 3 3     Cardiac EnzymesNo results for input(s): TROPONINI in the last 168 hours. No results for input(s): TROPIPOC in the last 168 hours.  Chemistry Recent Labs  Lab 04/29/21 0505 04/30/21 0507 04/30/21 1451 04/30/21 1457 04/30/21 1501 05/01/21 0208  NA 139 138   < > 142 142 139  K 3.5 4.4   < > 4.1 4.0 4.5  CL 106 105  --   --   --  107  CO2 26 25  --   --   --  24  GLUCOSE 152* 146*  --   --   --  113*  BUN 14 18  --   --   --  17  CREATININE 0.56 0.65  --   --   --  0.66  CALCIUM 8.5* 9.2  --   --   --  9.0  PROT 6.3*  --   --   --   --   --   ALBUMIN 3.7  --   --   --   --   --   AST 12*  --   --   --   --   --   ALT 12  --   --   --   --   --    ALKPHOS 44  --   --   --   --   --   BILITOT 0.7  --   --   --   --   --   GFRNONAA >60 >60  --   --   --  >60  ANIONGAP 7 8  --   --   --  8   < > = values in this interval not displayed.    Hematology Recent Labs  Lab 04/29/21 0505 04/30/21 0507 04/30/21 1451 04/30/21 1457 04/30/21 1501 05/01/21 0208  WBC 5.8 8.8  --   --   --  7.6  RBC 4.64 4.69  --   --   --  4.39  HGB 15.1* 15.3*   < > 13.6 13.3 14.2  HCT 44.7 46.0   < > 40.0 39.0 41.9  MCV 96.3 98.1  --   --   --  95.4  MCH 32.5 32.6  --   --   --  32.3  MCHC 33.8 33.3  --   --   --  33.9  RDW 12.5 12.7  --   --   --  12.6  PLT 140* 170  --   --   --  138*   < > = values in this interval not displayed.   BNP Recent Labs  Lab 04/29/21 0007  BNP 37.0    DDimer  Recent Labs  Lab 04/29/21 0007  DDIMER 2.47*     Radiology  CARDIAC CATHETERIZATION  Result Date: 04/30/2021   LV end diastolic pressure is moderately elevated.   Angiographically normal coronary arteries   Moderate Aortic Stenosis by Cath Gradient, Severe Aortic Stenosis by Echo SUMMARY Angiographically normal coronary arteries. Moderate Aortic Stenosis by cardiac cath, Severe Aortic Stenosis  by Echo. Moderately elevated LVEDP and PCWP with normal PAP. RECOMMENDATIONS Return to nursing unit for ongoing care. Anticipate TAVR team consultation.   ECHOCARDIOGRAM COMPLETE  Result Date: 04/29/2021    ECHOCARDIOGRAM REPORT   Patient Name:   Crystal Silva Date of Exam: 04/29/2021 Medical Rec #:  [id number]     Height:  69.0 in Accession #:    [id number]    Weight:       163.3 lb Date of Birth:  March 05, 1961    BSA:          1.895 m Patient Age:    60 years      BP:           137/73 mmHg Patient Gender: F             HR:           70 bpm. [facility] Procedure: 2D Echo, Cardiac Doppler and Color Doppler Indications:    Aortic valve disorder  History:        Patient has no prior history of Echocardiogram examinations.                 COPD,  Signs/Symptoms:Chest Pain; Risk Factors:Tobacco use.  Sonographer:    Mikki Harbor Referring Phys: [id number] ASIA B ZIERLE-GHOSH IMPRESSIONS  1. Left ventricular ejection fraction, by estimation, is 70 to 75%. The left ventricle has hyperdynamic function. The left ventricle has no regional wall motion abnormalities. Left ventricular diastolic parameters were normal.  2. Right ventricular systolic function is normal. The right ventricular size is normal. There is mildly elevated pulmonary artery systolic pressure.  3. The mitral valve is normal in structure. No evidence of mitral valve regurgitation. No evidence of mitral stenosis.  4. The aortic valve was not well visualized. There is moderate calcification of the aortic valve. There is moderate thickening of the aortic valve. Aortic valve regurgitation is not visualized. Severe aortic valve stenosis. Aortic valve mean gradient measures 40.2 mmHg. Aortic valve peak gradient measures 64.0 mmHg. Aortic valve area, by VTI measures 0.84 cm.     DI 0.27  5. The inferior vena cava is normal in size with greater than 50% respiratory variability, suggesting right atrial pressure of 3 mmHg. FINDINGS  Left Ventricle: Left ventricular ejection fraction, by estimation, is 70 to 75%. The left ventricle has hyperdynamic function. The left ventricle has no regional wall motion abnormalities. The left ventricular internal cavity size was normal in size. There is no left ventricular hypertrophy. Left ventricular diastolic parameters were normal. Right Ventricle: The right ventricular size is normal. No increase in right ventricular wall thickness. Right ventricular systolic function is normal. There is mildly elevated pulmonary artery systolic pressure. The tricuspid regurgitant velocity is 2.69  m/s, and with an assumed right atrial pressure of 8 mmHg, the estimated right ventricular systolic pressure is 36.9 mmHg. Left Atrium: Left atrial size was normal in size. Right Atrium:  Right atrial size was normal in size. Pericardium: There is no evidence of pericardial effusion. Mitral Valve: The mitral valve is normal in structure. No evidence of mitral valve regurgitation. No evidence of mitral valve stenosis. MV peak gradient, 6.2 mmHg. The mean mitral valve gradient is 2.0 mmHg. Tricuspid Valve: The tricuspid valve is normal in structure. Tricuspid valve regurgitation is mild . No evidence of tricuspid stenosis. Aortic Valve: The aortic valve was not well visualized. There is moderate calcification of the aortic valve. There is moderate thickening of the aortic valve. There is moderate aortic valve annular calcification. Aortic valve regurgitation is not visualized. Severe aortic stenosis is present. Aortic valve mean gradient measures 40.2 mmHg. Aortic valve peak gradient measures 64.0 mmHg. Aortic valve area, by VTI measures 0.84 cm. Pulmonic Valve: The pulmonic valve was not well visualized. Pulmonic valve regurgitation is not visualized. No evidence of pulmonic  stenosis. Aorta: The aortic root is normal in size and structure. Venous: The inferior vena cava is normal in size with greater than 50% respiratory variability, suggesting right atrial pressure of 3 mmHg. IAS/Shunts: The interatrial septum was not well visualized.  LEFT VENTRICLE PLAX 2D LVIDd:         4.40 cm  Diastology LVIDs:         2.50 cm  LV e' medial:    9.14 cm/s LV PW:         0.80 cm  LV E/e' medial:  8.6 LV IVS:        0.90 cm  LV e' lateral:   10.10 cm/s LVOT diam:     2.00 cm  LV E/e' lateral: 7.8 LV SV:         80 LV SV Index:   42 LVOT Area:     3.14 cm  RIGHT VENTRICLE RV Basal diam:  2.65 cm RV Mid diam:    2.30 cm RV S prime:     14.80 cm/s TAPSE (M-mode): 2.6 cm LEFT ATRIUM             Index       RIGHT ATRIUM           Index LA diam:        3.10 cm 1.64 cm/m  RA Area:     11.70 cm LA Vol (A2C):   45.4 ml 23.95 ml/m RA Volume:   26.20 ml  13.82 ml/m LA Vol (A4C):   26.8 ml 14.14 ml/m LA Biplane Vol:  38.6 ml 20.36 ml/m  AORTIC VALVE AV Area (Vmax):    0.81 cm AV Area (Vmean):   0.81 cm AV Area (VTI):     0.84 cm AV Vmax:           400.00 cm/s AV Vmean:          289.970 cm/s AV VTI:            0.958 m AV Peak Grad:      64.0 mmHg AV Mean Grad:      40.2 mmHg LVOT Vmax:         103.00 cm/s LVOT Vmean:        74.800 cm/s LVOT VTI:          0.256 m LVOT/AV VTI ratio: 0.27  AORTA Ao Root diam: 3.20 cm Ao Asc diam:  3.00 cm MITRAL VALVE               TRICUSPID VALVE MV Area (PHT): 2.81 cm    TR Peak grad:   28.9 mmHg MV Area VTI:   2.62 cm    TR Vmax:        269.00 cm/s MV Peak grad:  6.2 mmHg MV Mean grad:  2.0 mmHg    SHUNTS MV Vmax:       1.24 m/s    Systemic VTI:  0.26 m MV Vmean:      65.6 cm/s   Systemic Diam: 2.00 cm MV Decel Time: 270 msec MV E velocity: 78.50 cm/s MV A velocity: 87.70 cm/s MV E/A ratio:  0.90 Dina Rich MD Electronically signed by Dina Rich MD Signature Date/Time: 04/29/2021/4:08:19 PM    Final     Cardiac Studies  LHC 04/30/2021   LV end diastolic pressure is moderately elevated.   Angiographically normal coronary arteries   Moderate Aortic Stenosis by Cath Gradient, Severe Aortic Stenosis by Echo   SUMMARY Angiographically normal coronary  arteries. Moderate Aortic Stenosis by cardiac cath, Severe Aortic Stenosis  by Echo. Moderately elevated LVEDP and PCWP with normal PAP.  TTE 04/29/2021    1. Left ventricular ejection fraction, by estimation, is 70 to 75%. The  left ventricle has hyperdynamic function. The left ventricle has no  regional wall motion abnormalities. Left ventricular diastolic parameters  were normal.   2. Right ventricular systolic function is normal. The right ventricular  size is normal. There is mildly elevated pulmonary artery systolic  pressure.   3. The mitral valve is normal in structure. No evidence of mitral valve  regurgitation. No evidence of mitral stenosis.   4. The aortic valve was not well visualized. There is moderate   calcification of the aortic valve. There is moderate thickening of the  aortic valve. Aortic valve regurgitation is not visualized. Severe aortic  valve stenosis. Aortic valve mean gradient  measures 40.2 mmHg. Aortic valve peak gradient measures 64.0 mmHg. Aortic  valve area, by VTI measures 0.84 cm.      DI 0.27   5. The inferior vena cava is normal in size with greater than 50%  respiratory variability, suggesting right atrial pressure of 3 mmHg.    Patient Profile  CLOA BUSHONG is a 60 y.o. female with history of tobacco abuse who was admitted to [facility] on 04/29/2021 with shortness of breath secondary to COPD exacerbation as well as found to have severe aortic stenosis.  Assessment & Plan   #Severe aortic stenosis -Admitted with COPD exacerbation as well and has very profound murmur.  Murmur is late peaking consistent with severe aortic stenosis.  Echocardiographic parameters are also consistent with severe aortic stenosis.  She has been short of breath for several weeks.  COPD is an issue however I do believe her aortic valve is not helping. -Left and right heart catheterization were unremarkable yesterday.  No CAD. -LVEDP 18 on left heart cath.  Wedge pressure 20 mmHg.  We will give 1 dose of IV Lasix. -She needs complete work-up of her aortic valve.  We will pursue TAVR CT today. 50 mg metoprolol tartrate to control HR during scan. We will exclude any aortic pathology and get better idea if this is a bicuspid valve.  -We also pursue carotid ultrasounds. -Given her young age I believe she merits surgical evaluation.  She will likely be better served by surgical aortic valve replacement.  I have reached out to the surgical team.  If they believe she is not a great candidate for SAVR we will have TAVR evaluation.  I believe we should start with surgical evaluation first.  #Elevated LVEDP/elevated pulmonary capillary wedge pressure -LVEDP 18 mmHg.  Wedge pressure 20.  We  will give a one-time dose of Lasix.  For questions or updates, please contact [facility] Please consult [email or web address] for contact info under   Time Spent with Patient: I have spent a total of 35 minutes with patient reviewing hospital notes, telemetry, EKGs, labs and examining the patient as well as establishing an assessment and plan that was discussed with the patient.  > 50% of time was spent in direct patient care.    Signed, Lenna Gilford. Flora Lipps, MD, [facility] [place]  [facility]  05/01/2021 9:36 AM

## 2021-05-01 NOTE — Progress Notes (Signed)
CARDIAC REHAB PHASE I   PRE:  Rate/Rhythm: 62 SR  BP:  Supine:   Sitting: 117/77  Standing:    SaO2: 95%RA  MODE:  Ambulation: 470 ft   POST:  Rate/Rhythm: 75 SR  BP:  Supine:   Sitting: 118/99  Standing:    SaO2: 95%RA 1410-1455 Pt walked 470 ft on RA with steady gait and tolerated well. Pt stated she would probably be going home and coming back for surgery. Gave pt OHS booklet, care guide, staying in the tube handout, and wrote down how to view pre op video. Discussed sternal precautions and staying in the tube. Demonstrated how to get up and down without use of arms. Discussed importance of IS and walking after surgery.. pt has IS in room and stated can get to 1750 ml. Encouraged use at home and smoking cessation. Discussed calling 1800quitnow if needed. Pt stated her sister will be coming to stay with her after surgery to assist in care.   Luetta Nutting, RN BSN  05/01/2021 2:51 PM

## 2021-05-01 NOTE — Consult Note (Addendum)
    [address]       [place] [address]             [phone]        Shalinda L Townsend [place] Medical Record #[id number] Date of Birth: 09/06/1960  Referring: [place] O'Neal, MD Primary Care: Patient, No Pcp Per (Inactive) Primary Cardiologist:Branch, Jonathan, MD  Reason for consult:  Evaluation for surgical management of severe aortic stenosis   History of Present Illness:     Ms. Carren L.Balaguer is a 60-year-old female 40 pack-year tobacco smoker with no significant past medical history who presented to the emergency room on 04/28/2021 with dyspnea on exertion and sensation of her heart racing.  She described having similar symptoms over the past year but these have been more frequent and intense over the past 6 weeks. Work up in the emergency room included an EKG that showed normal sinus rhythm with no ischemic changes.  Serial troponins were 3 and 3.  BNP was 37.  CT angiogram of the chest demonstrated significant aortic valve calcification, no coronary calcification.  There was a 7 mm right upper lobe nodule without lymphadenopathy for which repeat CT is recommended in 6 to 12 months was recommended.  She was also noted to have evidence of emphysema on CT scan.  Physical exam was notable for a prominent systolic murmur.  She was admitted to the hospital for further work-up.  An echocardiogram was obtained on 04/29/2021 confirmed severe aortic stenosis.  Left ventricular ejection fraction was estimated at 70 to 75%.  RV function was normal.  There was slight elevation in the EDP and PA systolic pressures.  The mean aortic valve gradient was measured at 40.2 mmHg with a peak gradient of 64 mmHg.  Calculated aortic valve area was 0.84 cm. Left heart catheterization was performed yesterday which demonstrated significant no obstructive coronary artery disease.  The patient was felt to be symptomatic from both chronic obstructive pulmonary disease and the aortic stenosis.   She has been treated with diuresis, oral steroids, and inhaled  bronchodilators and  has improved symptomatically.  TAVR CT's and an orthopantogram were performed earlier today, results are pending.  Ms. Solis denies any other medical problems and has had no other surgery. She works full-time which involves computer keyboarding and she says she has the option to work from home. She has a full upper plate denture. Her remaining 6 lower teeth are in good repair.  Last dental visit was 2 years ago.   We are asked to evaluate Ms. Ruehl for consideration of aortic valve replacement.  Current Activity/ Functional Status:   Zubrod Score: At the time of surgery this patient's most appropriate activity status/level should be described as: []    0    Normal activity, no symptoms []    1    Restricted in physical strenuous activity but ambulatory, able to do out light work [x]    2    Ambulatory and capable of self care, unable to do work activities, up and about                 more than 50%  Of the time                            []    3    Only limited self care, in bed greater than 50% of waking hours []      4    Completely disabled, no self care, confined to bed or chair []    5    Moribund  History reviewed. No pertinent past medical history.  Past Surgical History:  Procedure Laterality Date   CHOLECYSTECTOMY     lt knee surgery     RIGHT/LEFT HEART CATH AND CORONARY ANGIOGRAPHY N/A 04/30/2021   Procedure: RIGHT/LEFT HEART CATH AND CORONARY ANGIOGRAPHY;  Surgeon: Harding, David W, MD;  Location: [facility];  Service: Cardiovascular;  Laterality: N/A;    Social History   Tobacco Use  Smoking Status Every Day   Packs/day: 1.00   Types: Cigarettes  Smokeless Tobacco Never    Social History   Substance and Sexual Activity  Alcohol Use No     No Known Allergies  Current Facility-Administered Medications  Medication Dose Route Frequency Provider Last Rate Last Admin   0.9  %  sodium chloride infusion  250 mL Intravenous PRN Harding, David W, MD       acetaminophen (TYLENOL) tablet 650 mg  650 mg Oral Q6H PRN Harding, David W, MD   650 mg at 04/29/21 1857   Or   acetaminophen (TYLENOL) suppository 650 mg  650 mg Rectal Q6H PRN Harding, David W, MD       albuterol (PROVENTIL) (2.5 MG/3ML) 0.083% nebulizer solution 2.5 mg  2.5 mg Nebulization Q2H PRN Harding, David W, MD       azithromycin (ZITHROMAX) tablet 500 mg  500 mg Oral Daily Harding, David W, MD   500 mg at 05/01/21 0937   furosemide (LASIX) injection 20 mg  20 mg Intravenous Once O'Neal, Armstrong Thomas, MD       heparin injection 5,000 Units  5,000 Units Subcutaneous Q8H Harding, David W, MD   5,000 Units at 05/01/21 0458   hydrALAZINE (APRESOLINE) injection 10 mg  10 mg Intravenous Q4H PRN Harding, David W, MD       HYDROcodone-acetaminophen (NORCO/VICODIN) 5-325 MG per tablet 1 tablet  1 tablet Oral Q4H PRN Harding, David W, MD   1 tablet at 04/29/21 2141   insulin aspart (novoLOG) injection 0-5 Units  0-5 Units Subcutaneous QHS Harding, David W, MD       insulin aspart (novoLOG) injection 0-9 Units  0-9 Units Subcutaneous TID WC Harding, David W, MD   1 Units at 04/30/21 1757   ipratropium-albuterol (DUONEB) 0.5-2.5 (3) MG/3ML nebulizer solution 3 mL  3 mL Nebulization BID PRN Feliz Ortiz, Abraham, MD       methylPREDNISolone sodium succinate (SOLU-MEDROL) 40 mg/mL injection 40 mg  40 mg Intravenous Q8H Harding, David W, MD   40 mg at 05/01/21 0458   mometasone-formoterol (DULERA) 200-5 MCG/ACT inhaler 2 puff  2 puff Inhalation BID Harding, David W, MD   2 puff at 05/01/21 0847   morphine 2 MG/ML injection 2 mg  2 mg Intravenous Q2H PRN Harding, David W, MD       ondansetron (ZOFRAN) tablet 4 mg  4 mg Oral Q6H PRN Harding, David W, MD       Or   ondansetron (ZOFRAN) injection 4 mg  4 mg Intravenous Q6H PRN Harding, David W, MD       senna-docusate (Senokot-S) tablet 1 tablet  1 tablet Oral QHS PRN Harding,  David W, MD       sodium chloride flush (NS) 0.9 % injection 3 mL  3 mL Intravenous Q12H Harding, David W, MD   3 mL at 04/30/21 2116   sodium   chloride flush (NS) 0.9 % injection 3 mL  3 mL Intravenous PRN Harding, David W, MD       traZODone (DESYREL) tablet 50 mg  50 mg Oral QHS PRN Harding, David W, MD        Medications Prior to Admission  Medication Sig Dispense Refill Last Dose   azithromycin (ZITHROMAX) 250 MG tablet Take 1 tablet (250 mg total) by mouth daily. Take first 2 tablets together, then 1 every day until finished. (Patient not taking: No sig reported) 6 tablet 0 Completed Course   desloratadine (CLARINEX) 5 MG tablet Take 1 tablet (5 mg total) by mouth daily as needed. (Patient not taking: No sig reported) 10 tablet 0 Not Taking   HYDROcodone-acetaminophen (NORCO/VICODIN) 5-325 MG per tablet Take 1 tablet by mouth every 4 (four) hours as needed. (Patient not taking: No sig reported) 15 tablet 0 Completed Course   ibuprofen (ADVIL,MOTRIN) 600 MG tablet Take 1 tablet (600 mg total) by mouth every 6 (six) hours as needed for pain. (Patient not taking: No sig reported) 30 tablet 0 Not Taking   predniSONE (STERAPRED UNI-PAK 21 TAB) 10 MG (21) TBPK tablet Take 6 tabs by mouth daily  for 1 days, then 5 tabs for 1 days, then 4 tabs for 1 days, then 3 tabs for 1 days, 2 tabs for 1 days, then 1 tab by mouth daily for 1 days (Patient not taking: No sig reported) 21 tablet 0 Completed Course    Family History  Problem Relation Age of Onset   Heart failure Mother    Heart disease Mother        stent     Review of Systems:   ROS     Cardiac Review of Systems: Y or  [    ]= no  Chest Pain [    ]  Resting SOB [   ] Exertional SOB  [ x ]  Orthopnea [  ]   Pedal Edema [  x ]    Palpitations [ x ] Syncope  [  ]   Presyncope [   ]  General Review of Systems: [Y] = yes [  ]=no Constitional: recent weight change [  ]; anorexia [  ]; fatigue [ x ]; nausea [  ]; night sweats [  ]; fever [   ]; or chills [  ]                                                               Dental: Last Dentist visit: 2 years ago  Eye : blurred vision [  ]; diplopia [   ]; vision changes [  ];  Amaurosis fugax[  ]; Resp: cough [  ];  wheezing[  ];  hemoptysis[  ]; shortness of breath[ x ]; paroxysmal nocturnal dyspnea[  ]; dyspnea on exertion[ x ]; or orthopnea[  ];  GI:  gallstones[  ], vomiting[  ];  dysphagia[  ]; melena[  ];  hematochezia [  ]; heartburn[  ];   Hx of  Colonoscopy[  ]; GU: kidney stones [  ]; hematuria[  ];   dysuria [  ];  nocturia[  ];  history of     obstruction [  ]; urinary frequency [  ]               Skin: rash, swelling[  ];, hair loss[  ];  peripheral edema[  ];  or itching[  ]; Musculosketetal: myalgias[  ];  joint swelling[  ];  joint erythema[  ];  joint pain[  ];  back pain[  ];  Heme/Lymph: bruising[  ];  bleeding[  ];  anemia[  ];  Neuro: TIA[  ];  headaches[  ];  stroke[  ];  vertigo[  ];  seizures[  ];   paresthesias[  ];  difficulty walking[  ];  Psych:depression[  ]; anxiety[  ];  Endocrine: diabetes[  ];  thyroid dysfunction[  ]      Physical Exam: BP 110/60   Pulse 69   Temp 97.8 F (36.6 C) (Axillary)   Resp 19   Ht 5' 9" (1.753 m)   Wt 71.7 kg   LMP 12/02/2013   SpO2 99%   BMI 23.34 kg/m    General appearance: alert, cooperative, and mildly anxious but in no distress Head: Normocephalic, without obvious abnormality, atraumatic Neck: no adenopathy, no carotid bruit, no JVD, and supple, symmetrical, trachea midline Lymph nodes: No cervical or clavicular adenopathy Resp: breath sounds distant but clear. Chest has increased AP diameter.  Cardio: RRR, prominent systolic murmur GI: soft and non-tender Extremities: thin extremities without deformity and easily palpable distal pulses.  No peripheral edema. Neurologic: Grossly normal   Recent Radiology Findings:   CLINICAL DATA:  Shortness of breath   EXAM: CHEST - 2 VIEW   COMPARISON:   04/06/2020   FINDINGS: Hyperinflated lungs. The heart size and mediastinal contours are within normal limits. No focal pulmonary opacity. No pleural effusion or pneumothorax. No acute osseous abnormality. Right upper quadrant surgical clips.   IMPRESSION: No acute cardiopulmonary disease.     Electronically Signed   By: Alison  Vasan M.D.   On: 04/28/2021 22:27   Diagnostic Studies & Laboratory data:      ECHOCARDIOGRAM REPORT     Patient Name:   Amadi L Giraldo Date of Exam: 04/29/2021  Medical Rec #:  [id number]     Height:       69.0 in  Accession #:    [id number]    Weight:       163.3 lb  Date of Birth:  12/03/1960    BSA:          1.895 m  Patient Age:    59 years      BP:           137/73 mmHg  Patient Gender: F             HR:           70 bpm.  Exam Location:  [facility]   Procedure: 2D Echo, Cardiac Doppler and Color Doppler   Indications:    Aortic valve disorder     History:        Patient has no prior history of Echocardiogram  examinations.                  COPD, Signs/Symptoms:Chest Pain; Risk Factors:Tobacco use.     Sonographer:    Dorothy Buchanan  Referring Phys: [id number] ASIA B ZIERLE-GHOSH   IMPRESSIONS     1. Left ventricular ejection fraction, by estimation, is 70 to 75%. The  left ventricle has hyperdynamic function. The left ventricle has no  regional wall motion abnormalities. Left ventricular diastolic parameters  were normal.   2. Right ventricular systolic function is normal. The right ventricular    size is normal. There is mildly elevated pulmonary artery systolic  pressure.   3. The mitral valve is normal in structure. No evidence of mitral valve  regurgitation. No evidence of mitral stenosis.   4. The aortic valve was not well visualized. There is moderate  calcification of the aortic valve. There is moderate thickening of the  aortic valve. Aortic valve regurgitation is not visualized. Severe aortic  valve stenosis. Aortic valve mean  gradient  measures 40.2 mmHg. Aortic valve peak gradient measures 64.0 mmHg. Aortic  valve area, by VTI measures 0.84 cm.      DI 0.27   5. The inferior vena cava is normal in size with greater than 50%  respiratory variability, suggesting right atrial pressure of 3 mmHg.   FINDINGS   Left Ventricle: Left ventricular ejection fraction, by estimation, is 70  to 75%. The left ventricle has hyperdynamic function. The left ventricle  has no regional wall motion abnormalities. The left ventricular internal  cavity size was normal in size.  There is no left ventricular hypertrophy. Left ventricular diastolic  parameters were normal.   Right Ventricle: The right ventricular size is normal. No increase in  right ventricular wall thickness. Right ventricular systolic function is  normal. There is mildly elevated pulmonary artery systolic pressure. The  tricuspid regurgitant velocity is 2.69   m/s, and with an assumed right atrial pressure of 8 mmHg, the estimated  right ventricular systolic pressure is 36.9 mmHg.   Left Atrium: Left atrial size was normal in size.   Right Atrium: Right atrial size was normal in size.   Pericardium: There is no evidence of pericardial effusion.   Mitral Valve: The mitral valve is normal in structure. No evidence of  mitral valve regurgitation. No evidence of mitral valve stenosis. MV peak  gradient, 6.2 mmHg. The mean mitral valve gradient is 2.0 mmHg.   Tricuspid Valve: The tricuspid valve is normal in structure. Tricuspid  valve regurgitation is mild . No evidence of tricuspid stenosis.   Aortic Valve: The aortic valve was not well visualized. There is moderate  calcification of the aortic valve. There is moderate thickening of the  aortic valve. There is moderate aortic valve annular calcification. Aortic  valve regurgitation is not  visualized. Severe aortic stenosis is present. Aortic valve mean gradient  measures 40.2 mmHg. Aortic valve peak  gradient measures 64.0 mmHg. Aortic  valve area, by VTI measures 0.84 cm.   Pulmonic Valve: The pulmonic valve was not well visualized. Pulmonic valve  regurgitation is not visualized. No evidence of pulmonic stenosis.   Aorta: The aortic root is normal in size and structure.   Venous: The inferior vena cava is normal in size with greater than 50%  respiratory variability, suggesting right atrial pressure of 3 mmHg.   IAS/Shunts: The interatrial septum was not well visualized.      LEFT VENTRICLE  PLAX 2D  LVIDd:         4.40 cm  Diastology  LVIDs:         2.50 cm  LV e' medial:    9.14 cm/s  LV PW:         0.80 cm  LV E/e' medial:  8.6  LV IVS:        0.90 cm  LV e' lateral:   10.10 cm/s  LVOT diam:     2.00 cm  LV E/e' lateral: 7.8  LV SV:         80    LV SV Index:   42  LVOT Area:     3.14 cm      RIGHT VENTRICLE  RV Basal diam:  2.65 cm  RV Mid diam:    2.30 cm  RV S prime:     14.80 cm/s  TAPSE (M-mode): 2.6 cm   LEFT ATRIUM             Index       RIGHT ATRIUM           Index  LA diam:        3.10 cm 1.64 cm/m  RA Area:     11.70 cm  LA Vol (A2C):   45.4 ml 23.95 ml/m RA Volume:   26.20 ml  13.82 ml/m  LA Vol (A4C):   26.8 ml 14.14 ml/m  LA Biplane Vol: 38.6 ml 20.36 ml/m   AORTIC VALVE  AV Area (Vmax):    0.81 cm  AV Area (Vmean):   0.81 cm  AV Area (VTI):     0.84 cm  AV Vmax:           400.00 cm/s  AV Vmean:          289.970 cm/s  AV VTI:            0.958 m  AV Peak Grad:      64.0 mmHg  AV Mean Grad:      40.2 mmHg  LVOT Vmax:         103.00 cm/s  LVOT Vmean:        74.800 cm/s  LVOT VTI:          0.256 m  LVOT/AV VTI ratio: 0.27     AORTA  Ao Root diam: 3.20 cm  Ao Asc diam:  3.00 cm   MITRAL VALVE               TRICUSPID VALVE  MV Area (PHT): 2.81 cm    TR Peak grad:   28.9 mmHg  MV Area VTI:   2.62 cm    TR Vmax:        269.00 cm/s  MV Peak grad:  6.2 mmHg  MV Mean grad:  2.0 mmHg    SHUNTS  MV Vmax:       1.24 m/s    Systemic VTI:   0.26 m  MV Vmean:      65.6 cm/s   Systemic Diam: 2.00 cm  MV Decel Time: 270 msec  MV E velocity: 78.50 cm/s  MV A velocity: 87.70 cm/s  MV E/A ratio:  0.90   Jonathan Branch MD  Electronically signed by Jonathan Branch MD  Signature Date/Time: 04/29/2021/4:08:19 PM      RIGHT/LEFT HEART CATH    Conclusion      LV end diastolic pressure is moderately elevated.   Angiographically normal coronary arteries   Moderate Aortic Stenosis by Cath Gradient, Severe Aortic Stenosis by Echo   SUMMARY Angiographically normal coronary arteries. Moderate Aortic Stenosis by cardiac cath, Severe Aortic Stenosis  by Echo. Moderately elevated LVEDP and PCWP with normal PAP.     RECOMMENDATIONS Return to nursing unit for ongoing care. Anticipate TAVR team consultation.  Coronary Findings  Diagnostic Dominance: Right Left Main  Vessel was injected. Vessel is large. Vessel is angiographically normal.  Left Anterior Descending  Vessel was injected. Vessel is normal in caliber. Vessel is angiographically normal.  Third Diagonal Branch  Vessel was injected. Vessel is small in size. Vessel is   angiographically normal.  Left Circumflex  Vessel was injected. Vessel is large. Vessel is angiographically normal.  First Obtuse Marginal Branch  Vessel was injected. Vessel is large in size. Vessel is angiographically normal.  Right Coronary Artery  Vessel was injected. Vessel is normal in caliber. Vessel is angiographically normal.  Acute Marginal Branch  Vessel was injected. Vessel is small in size. Vessel is angiographically normal.  Right Posterior Atrioventricular Artery  Vessel was injected. Vessel is moderate in size. Vessel is angiographically normal.  Third Right Posterolateral Branch  Vessel was injected. Vessel is small in size. Vessel is angiographically normal.  Intervention  No interventions have been documented. Right Heart  Right Heart Pressures PAP 29/14 mmHg-mean 22 mmHg PCWP  mean 20 mmHg with V wave of 26 mmHg.  Elevated LV EDP consistent with volume overload. LV P-EDP: 149/9 mmHg-18 mmHg AoP-MAP: 124/72 mmHg - 95 mmHg.  Ao sat 98%, PA sat 81%: Cardiac Output-Index (Fick) 7.0.-3.6  Right Atrium The right atrial size is normal. Right atrial pressure is elevated. Mildly elevated: RAP 10 mmHg  Right Ventricle RVP-EDP: 35/5 mmHg - 12 mmHg.   Wall Motion  No LV gram         Left Heart  Left Ventricle LV end diastolic pressure is moderately elevated.  Aortic Valve There is moderate aortic valve stenosis. By Cath Lab gradients moderate, by echo gradient severe. Peak to peak gradient 25 mmHg, mean 22 mmHg with a velocity of 284 m/s   Coronary Diagrams   Diagnostic Dominance: Right       VAS US DOPPLER PRE CABG  Result Date: 05/01/2021 PREOPERATIVE VASCULAR EVALUATION Patient Name:  Adana L Erhard  Date of Exam:   05/01/2021 Medical Rec #: [id number]      Accession #:    [id number] Date of Birth: 01/29/1961     Patient Gender: F Patient Age:   59 years Exam Location:  [facility] Procedure:      VAS US DOPPLER PRE CABG Referring Phys: [place] O'NEAL --------------------------------------------------------------------------------  Indications:      Pre-AVR. Risk Factors:     Current smoker. Other Factors:    Severe aortic stenosis. Comparison Study: No prior studies. Performing Technologist: Rachel Hodge RDMS, RVT  Examination Guidelines: A complete evaluation includes B-mode imaging, spectral Doppler, color Doppler, and power Doppler as needed of all accessible portions of each vessel. Bilateral testing is considered an integral part of a complete examination. Limited examinations for reoccurring indications may be performed as noted.  Right Carotid Findings: +----------+--------+--------+--------+--------+------------------+           PSV cm/sEDV cm/sStenosisDescribeComments           +----------+--------+--------+--------+--------+------------------+  CCA Prox  71      19                                         +----------+--------+--------+--------+--------+------------------+ CCA Distal81      26                                         +----------+--------+--------+--------+--------+------------------+ ICA Prox  65      21                      intimal thickening +----------+--------+--------+--------+--------+------------------+ ICA Distal83      34                                         +----------+--------+--------+--------+--------+------------------+   ECA       91      14                                         +----------+--------+--------+--------+--------+------------------+ +----------+--------+-------+----------------+------------+           PSV cm/sEDV cmsDescribe        Arm Pressure +----------+--------+-------+----------------+------------+ Subclavian169            Multiphasic, WNL             +----------+--------+-------+----------------+------------+ +---------+--------+--+--------+--+---------+ VertebralPSV cm/s67EDV cm/s22Antegrade +---------+--------+--+--------+--+---------+ Left Carotid Findings: +----------+--------+--------+--------+--------+------------------+           PSV cm/sEDV cm/sStenosisDescribeComments           +----------+--------+--------+--------+--------+------------------+ CCA Prox  94      21                                         +----------+--------+--------+--------+--------+------------------+ CCA Distal75      18                                         +----------+--------+--------+--------+--------+------------------+ ICA Prox  49      19                      intimal thickening +----------+--------+--------+--------+--------+------------------+ ICA Distal53      23                                         +----------+--------+--------+--------+--------+------------------+ ECA       94      17                                          +----------+--------+--------+--------+--------+------------------+ +----------+--------+--------+----------------+------------+ SubclavianPSV cm/sEDV cm/sDescribe        Arm Pressure +----------+--------+--------+----------------+------------+           121             Multiphasic, WNL             +----------+--------+--------+----------------+------------+ +---------+--------+--+--------+--+---------+ VertebralPSV cm/s33EDV cm/s12Antegrade +---------+--------+--+--------+--+---------+  ABI Findings: +--------+------------------+-----+---------+--------+ Right   Rt Pressure (mmHg)IndexWaveform Comment  +--------+------------------+-----+---------+--------+ Brachial                       triphasic         +--------+------------------+-----+---------+--------+ +--------+------------------+-----+---------+-------+ Left    Lt Pressure (mmHg)IndexWaveform Comment +--------+------------------+-----+---------+-------+ Brachial                       triphasic        +--------+------------------+-----+---------+-------+  Right Doppler Findings: +--------+--------+-----+---------+--------+ Site    PressureIndexDoppler  Comments +--------+--------+-----+---------+--------+ Brachial             triphasic         +--------+--------+-----+---------+--------+ Radial               triphasic         +--------+--------+-----+---------+--------+ Ulnar                  triphasic         +--------+--------+-----+---------+--------+  Left Doppler Findings: +--------+--------+-----+---------+--------+ Site    PressureIndexDoppler  Comments +--------+--------+-----+---------+--------+ Brachial             triphasic         +--------+--------+-----+---------+--------+ Radial               triphasic         +--------+--------+-----+---------+--------+ Ulnar                biphasic          +--------+--------+-----+---------+--------+  Summary:  Right Carotid: The extracranial vessels were near-normal with only minimal wall                thickening or plaque. Left Carotid: The extracranial vessels were near-normal with only minimal wall               thickening or plaque. Vertebrals:  Bilateral vertebral arteries demonstrate antegrade flow. Subclavians: Normal flow hemodynamics were seen in bilateral subclavian              arteries. Right Upper Extremity: Doppler waveform obliterate with right radial compression. Doppler waveforms remain within normal limits with right ulnar compression.  Left Upper Extremity: Doppler waveforms remain within normal limits with left radial compression. Doppler waveforms remain within normal limits with left ulnar compression.     Preliminary      I have independently reviewed the above radiologic studies and discussed with the patient   Recent Lab Findings: Lab Results  Component Value Date   WBC 7.6 05/01/2021   HGB 14.2 05/01/2021   HCT 41.9 05/01/2021   PLT 138 (L) 05/01/2021   GLUCOSE 113 (H) 05/01/2021   ALT 12 04/29/2021   AST 12 (L) 04/29/2021   NA 139 05/01/2021   K 4.5 05/01/2021   CL 107 05/01/2021   CREATININE 0.66 05/01/2021   BUN 17 05/01/2021   CO2 24 05/01/2021      Assessment / Plan:    -60-year-old female with severe symptomatic aortic stenosis.  She feels she has had significant improvement in her symptoms since admission with the current medical regimen that is included diuresis with Lasix, IV Solu-Medrol, and oral antibiotics.  Surgical valve replacement is likely her best option for long-term management. Procedure and expected perioperative course were briefly discussed with Ms. Voong and her questions were answered.  She would like for us to proceed with completing the necessary work-up to prepare for surgery.  Dr. Zaydn Gutridge will review all clinical data and will see Ms. Fortune later today.  Timing of surgery and selection of the appropriate valve prosthesis will be  addressed at that time.  -Suspected COPD-this given her 40 pack-year history of tobacco use and findings on CT scan.  Pulmonary function test will be obtained.  Patient strongly encouraged to avoid smoking between now and the time of surgery in order to optimize her pulmonary function.  -Right upper lobe pulmonary nodule-7 mm nodule, no obvious lymphadenopathy.  CT scanning is recommended in 6 to 12 months.   I  spent 25 minutes counseling the patient face to face.   Myron G. Roddenberry, PA-C  05/01/2021 10:57 AM   Patient seen and examined, chart and echo images reviewed. 59 yo woman with a history of tobacco abuse, COPD, and right lung nodule.  Presented with shortness of breath. Symptoms improved dramatically with medical therapy. Noted to have a high pitched systolic murmur. Echo showed severe AS with valve area   0.84. Preserved EF. Cath showed normal coronaries. Interestingly gradient across Ao valve not as high at cath and c/w moderate AS/  Will discuss discrepancy between cath and echo with Cardiology, but exam and echo images are c/w severe AS.  Options include TAVR, surgical AVR with tissue or mechanical valve.  I thiink given her young age she would be best treated with surgical AVR.   Discussed advantages and disadvantages of tissue v mechanical valves with her. She understands the issues involved. She is very reluctant to take coumadin, She understands she would likely need additional interventions if she opts for a tissue valve.  I discussed the general nature of the procedure, including the need for general anesthesia, the incisions to be used, the use of cardiopulmonary bypass, and the need for drainage tubes and pacing wires postoperatively. We discussed the expected hospital stay, overall recovery and short and long term outcomes. I informed her of the indications, risks, benefits and alternatives.  She understands the risks include but are not limited to death, stroke, MI,  DVT/PE, bleeding, possible need for transfusion, infections, cardiac arrhythmias, heart block requiring pacemaker as well as other organ system dysfunction including respiratory, renal, or GI complications.   She accepts the risks and agrees to proceed.   She has few remaining teeth but has not seen a dentist in awhile.  She will make an appointment for a dental evaluation as outpatient  Also need PFTs as outpatient prior to surgery.  Vinayak Bobier C. Dajion Bickford, MD [facility] [phone]    

## 2021-05-02 DIAGNOSIS — R079 Chest pain, unspecified: Secondary | ICD-10-CM | POA: Diagnosis not present

## 2021-05-02 DIAGNOSIS — I350 Nonrheumatic aortic (valve) stenosis: Secondary | ICD-10-CM | POA: Diagnosis not present

## 2021-05-02 DIAGNOSIS — I359 Nonrheumatic aortic valve disorder, unspecified: Secondary | ICD-10-CM | POA: Diagnosis not present

## 2021-05-02 LAB — MAGNESIUM: Magnesium: 2.5 mg/dL — ABNORMAL HIGH (ref 1.7–2.4)

## 2021-05-02 LAB — BASIC METABOLIC PANEL
Anion gap: 9 (ref 5–15)
BUN: 23 mg/dL — ABNORMAL HIGH (ref 6–20)
CO2: 27 mmol/L (ref 22–32)
Calcium: 8.9 mg/dL (ref 8.9–10.3)
Chloride: 101 mmol/L (ref 98–111)
Creatinine, Ser: 0.74 mg/dL (ref 0.44–1.00)
GFR, Estimated: >60 mL/min (ref >60)
Glucose, Bld: 116 mg/dL — ABNORMAL HIGH (ref 70–99)
Potassium: 3.9 mmol/L (ref 3.5–5.1)
Sodium: 137 mmol/L (ref 135–145)

## 2021-05-02 LAB — CBC
HCT: 42.1 % (ref 36.0–46.0)
Hemoglobin: 14.4 g/dL (ref 12.0–15.0)
MCH: 32.3 pg (ref 26.0–34.0)
MCHC: 34.2 g/dL (ref 30.0–36.0)
MCV: 94.4 fL (ref 80.0–100.0)
Platelets: 148 10*3/uL — ABNORMAL LOW (ref 150–400)
RBC: 4.46 MIL/uL (ref 3.87–5.11)
RDW: 12.4 % (ref 11.5–15.5)
WBC: 5.9 10*3/uL (ref 4.0–10.5)
nRBC: 0 % (ref 0.0–0.2)

## 2021-05-02 LAB — GLUCOSE, CAPILLARY
Glucose-Capillary: 119 mg/dL — ABNORMAL HIGH (ref 70–99)
Glucose-Capillary: 128 mg/dL — ABNORMAL HIGH (ref 70–99)

## 2021-05-02 MED ORDER — PREDNISONE 10 MG PO TABS
ORAL_TABLET | ORAL | 0 refills | Status: DC
Start: 2021-05-02 — End: 2021-05-26

## 2021-05-02 MED ORDER — AZITHROMYCIN 250 MG PO TABS: ORAL_TABLET | ORAL | 0 refills | Status: DC

## 2021-05-02 NOTE — Discharge Instructions (Signed)
Crystal Silva was admitted to the Hospital on 04/28/2021 and Discharged on Discharge Date 05/02/2021 and should be excused from work/school   for 10  days starting 04/28/2021 , may return to work/school without any restrictions.  Call Lambert Keto MD, [facility] [phone] with questions.  Marinda Elk M.D on 05/02/2021,at 12:48 PM  [facility] Office  [phone]

## 2021-05-02 NOTE — Plan of Care (Signed)
  Problem: Education: Goal: Knowledge of General Education information will improve Description: Including pain rating scale, medication(s)/side effects and non-pharmacologic comfort measures 05/02/2021 1154 by Ancil Linsey, RN Outcome: Adequate for Discharge 05/02/2021 1154 by Ancil Linsey, RN Outcome: Adequate for Discharge   Problem: Health Behavior/Discharge Planning: Goal: Ability to manage health-related needs will improve 05/02/2021 1154 by Ancil Linsey, RN Outcome: Adequate for Discharge 05/02/2021 1154 by Ancil Linsey, RN Outcome: Adequate for Discharge   Problem: Clinical Measurements: Goal: Ability to maintain clinical measurements within normal limits will improve 05/02/2021 1154 by Ancil Linsey, RN Outcome: Adequate for Discharge 05/02/2021 1154 by Ancil Linsey, RN Outcome: Adequate for Discharge Goal: Will remain free from infection 05/02/2021 1154 by Ancil Linsey, RN Outcome: Adequate for Discharge 05/02/2021 1154 by Ancil Linsey, RN Outcome: Adequate for Discharge Goal: Diagnostic test results will improve 05/02/2021 1154 by Ancil Linsey, RN Outcome: Adequate for Discharge 05/02/2021 1154 by Ancil Linsey, RN Outcome: Adequate for Discharge Goal: Respiratory complications will improve 05/02/2021 1154 by Ancil Linsey, RN Outcome: Adequate for Discharge 05/02/2021 1154 by Ancil Linsey, RN Outcome: Adequate for Discharge Goal: Cardiovascular complication will be avoided 05/02/2021 1154 by Ancil Linsey, RN Outcome: Adequate for Discharge 05/02/2021 1154 by Ancil Linsey, RN Outcome: Adequate for Discharge   Problem: Activity: Goal: Risk for activity intolerance will decrease 05/02/2021 1154 by Ancil Linsey, RN Outcome: Adequate for Discharge 05/02/2021 1154 by Ancil Linsey, RN Outcome: Adequate for Discharge   Problem: Nutrition: Goal: Adequate nutrition will be  maintained 05/02/2021 1154 by Ancil Linsey, RN Outcome: Adequate for Discharge 05/02/2021 1154 by Ancil Linsey, RN Outcome: Adequate for Discharge   Problem: Coping: Goal: Level of anxiety will decrease 05/02/2021 1154 by Ancil Linsey, RN Outcome: Adequate for Discharge 05/02/2021 1154 by Ancil Linsey, RN Outcome: Adequate for Discharge   Problem: Elimination: Goal: Will not experience complications related to bowel motility 05/02/2021 1154 by Ancil Linsey, RN Outcome: Adequate for Discharge 05/02/2021 1154 by Ancil Linsey, RN Outcome: Adequate for Discharge Goal: Will not experience complications related to urinary retention 05/02/2021 1154 by Ancil Linsey, RN Outcome: Adequate for Discharge 05/02/2021 1154 by Ancil Linsey, RN Outcome: Adequate for Discharge   Problem: Pain Managment: Goal: General experience of comfort will improve 05/02/2021 1154 by Ancil Linsey, RN Outcome: Adequate for Discharge 05/02/2021 1154 by Ancil Linsey, RN Outcome: Adequate for Discharge   Problem: Safety: Goal: Ability to remain free from injury will improve 05/02/2021 1154 by Ancil Linsey, RN Outcome: Adequate for Discharge 05/02/2021 1154 by Ancil Linsey, RN Outcome: Adequate for Discharge   Problem: Skin Integrity: Goal: Risk for impaired skin integrity will decrease 05/02/2021 1154 by Ancil Linsey, RN Outcome: Adequate for Discharge 05/02/2021 1154 by Ancil Linsey, RN Outcome: Adequate for Discharge

## 2021-05-02 NOTE — Plan of Care (Signed)
  Problem: Health Behavior/Discharge Planning: Goal: Ability to manage health-related needs will improve Outcome: Progressing   Problem: Clinical Measurements: Goal: Ability to maintain clinical measurements within normal limits will improve Outcome: Progressing   Problem: Clinical Measurements: Goal: Will remain free from infection Outcome: Progressing   Problem: Clinical Measurements: Goal: Diagnostic test results will improve Outcome: Progressing   Problem: Clinical Measurements: Goal: Respiratory complications will improve Outcome: Progressing   Problem: Clinical Measurements: Goal: Cardiovascular complication will be avoided Outcome: Progressing   Problem: Safety: Goal: Ability to remain free from injury will improve Outcome: Progressing   Problem: Pain Managment: Goal: General experience of comfort will improve Outcome: Progressing

## 2021-05-02 NOTE — Progress Notes (Signed)
Cardiology Progress Note  Patient ID: Crystal Silva MRN: [id number] DOB: 03-Mar-1961 Date of Encounter: 05/02/2021  Primary Cardiologist: Dina Rich, MD  Subjective   Chief Complaint: none.   HPI: CT scan shows bicuspid aortic valve.  No aortopathy.  Breathing much improved.  PFT shows severe COPD but not finalized.  Surgery planning for surgical aortic valve replacement as an outpatient.  ROS:  All other ROS reviewed and negative. Pertinent positives noted in the HPI.     Inpatient Medications  Scheduled Meds:  azithromycin  500 mg Oral Daily   heparin  5,000 Units Subcutaneous Q8H   insulin aspart  0-5 Units Subcutaneous QHS   insulin aspart  0-9 Units Subcutaneous TID WC   methylPREDNISolone (SOLU-MEDROL) injection  40 mg Intravenous Q8H   mometasone-formoterol  2 puff Inhalation BID   sodium chloride flush  3 mL Intravenous Q12H   Continuous Infusions:  sodium chloride     PRN Meds: sodium chloride, acetaminophen **OR** acetaminophen, albuterol, hydrALAZINE, HYDROcodone-acetaminophen, ipratropium-albuterol, morphine injection, ondansetron **OR** ondansetron (ZOFRAN) IV, senna-docusate, sodium chloride flush, traZODone   Vital Signs   Vitals:   05/01/21 2355 05/02/21 0424 05/02/21 0747 05/02/21 0810  BP: 113/72 106/68 104/69   Pulse: 75 64 65   Resp: Temp: 98.6 F (37 C) 97.9 F (36.6 C) 98.2 F (36.8 C)   TempSrc: Oral Oral Oral   SpO2: 96% 96%  97%  Weight:  70.9 kg    Height:        Intake/Output Summary (Last 24 hours) at 05/02/2021 0832 Last data filed at 05/01/2021 2100 Gross per 24 hour  Intake 240 ml  Output --  Net 240 ml   Last 3 Weights 05/02/2021 05/01/2021 04/30/2021  Weight (lbs) 156 lb 4.9 oz 158 lb 1.1 oz 158 lb 14.4 oz  Weight (kg) 70.9 kg 71.7 kg 72.077 kg      Telemetry  Overnight telemetry shows sinus bradycardia in the 60s, which I personally reviewed.    Physical Exam   Vitals:   05/01/21 2355 05/02/21 0424  05/02/21 0747 05/02/21 0810  BP: 113/72 106/68 104/69   Pulse: 75 64 65   Resp: Temp: 98.6 F (37 C) 97.9 F (36.6 C) 98.2 F (36.8 C)   TempSrc: Oral Oral Oral   SpO2: 96% 96%  97%  Weight:  70.9 kg    Height:        Intake/Output Summary (Last 24 hours) at 05/02/2021 0832 Last data filed at 05/01/2021 2100 Gross per 24 hour  Intake 240 ml  Output --  Net 240 ml    Last 3 Weights 05/02/2021 05/01/2021 04/30/2021  Weight (lbs) 156 lb 4.9 oz 158 lb 1.1 oz 158 lb 14.4 oz  Weight (kg) 70.9 kg 71.7 kg 72.077 kg    Body mass index is 23.08 kg/m.  General: Well nourished, well developed, in no acute distress Head: Atraumatic, normal size  Eyes: PEERLA, EOMI  Neck: Supple, no JVD Endocrine: No thryomegaly Cardiac: Normal S1, S2; RRR; no murmurs, rubs, or gallops Lungs: Diminished breath sounds bilaterally Abd: Soft, nontender, no hepatomegaly  Ext: No edema, pulses 2+ Musculoskeletal: No deformities, BUE and BLE strength normal and equal Skin: Warm and dry, no rashes   Neuro: Alert and oriented to person, place, time, and situation, CNII-XII grossly intact, no focal deficits  Psych: Normal mood and affect   Labs  High Sensitivity Troponin:   Recent Labs  Lab 04/29/21  0007 04/29/21 0229  TROPONINIHS 3 3     Cardiac EnzymesNo results for input(s): TROPONINI in the last 168 hours. No results for input(s): TROPIPOC in the last 168 hours.  Chemistry Recent Labs  Lab 04/29/21 0505 04/30/21 0507 04/30/21 1451 04/30/21 1501 05/01/21 0208 05/02/21 0155  NA 139 138   < > 142 139 137  K 3.5 4.4   < > 4.0 4.5 3.9  CL 106 105  --   --  107 101  CO2 26 25  --   --  24 27  GLUCOSE 152* 146*  --   --  113* 116*  BUN 14 18  --   --  17 23*  CREATININE 0.56 0.65  --   --  0.66 0.74  CALCIUM 8.5* 9.2  --   --  9.0 8.9  PROT 6.3*  --   --   --   --   --   ALBUMIN 3.7  --   --   --   --   --   AST 12*  --   --   --   --   --   ALT 12  --   --   --   --   --   ALKPHOS 44   --   --   --   --   --   BILITOT 0.7  --   --   --   --   --   GFRNONAA >60 >60  --   --  >60 >60  ANIONGAP 7 8  --   --  8 9   < > = values in this interval not displayed.    Hematology Recent Labs  Lab 04/30/21 0507 04/30/21 1451 04/30/21 1501 05/01/21 0208 05/02/21 0155  WBC 8.8  --   --  7.6 5.9  RBC 4.69  --   --  4.39 4.46  HGB 15.3*   < > 13.3 14.2 14.4  HCT 46.0   < > 39.0 41.9 42.1  MCV 98.1  --   --  95.4 94.4  MCH 32.6  --   --  32.3 32.3  MCHC 33.3  --   --  33.9 34.2  RDW 12.7  --   --  12.6 12.4  PLT 170  --   --  138* 148*   < > = values in this interval not displayed.   BNP Recent Labs  Lab 04/29/21 0007  BNP 37.0    DDimer  Recent Labs  Lab 04/29/21 0007  DDIMER 2.47*     Radiology  DG Orthopantogram  Result Date: 05/01/2021 CLINICAL DATA:  Preoperative evaluation prior to heart surgery. EXAM: ORTHOPANTOGRAM/PANORAMIC COMPARISON:  None. FINDINGS: Maxilla is completely edentulous without evidence of retained fragment. Mandible shows residual teeth numbers 21 through 26. No evidence of dental decay or periodontal disease. No residual root fragments elsewhere. IMPRESSION: Edentulous with the exception of mandibular teeth 21 through 26. No evidence of dental decay or periodontal disease. Electronically Signed   By: Paulina Fusi M.D.   On: 05/01/2021 13:48   CARDIAC CATHETERIZATION  Result Date: 04/30/2021   LV end diastolic pressure is moderately elevated.   Angiographically normal coronary arteries   Moderate Aortic Stenosis by Cath Gradient, Severe Aortic Stenosis by Echo SUMMARY Angiographically normal coronary arteries. Moderate Aortic Stenosis by cardiac cath, Severe Aortic Stenosis  by Echo. Moderately elevated LVEDP and PCWP with normal PAP. RECOMMENDATIONS Return to nursing unit for ongoing care. Anticipate  TAVR team consultation.   CT CORONARY MORPH W/CTA COR W/SCORE W/CA W/CM &/OR WO/CM  Addendum Date: 05/01/2021   ADDENDUM REPORT: 05/01/2021  15:23 CLINICAL DATA:  Aortic stenosis EXAM: Cardiac TAVR CT TECHNIQUE: The patient was scanned on a Siemens Force 192 slice scanner. A 120 kV retrospective scan was triggered in the descending thoracic aorta at 111 HU's. Gantry rotation speed was 270 msecs and collimation was .9 mm. No beta blockade or nitro were given. The 3D data set was reconstructed in 5% intervals of the R-R cycle. Systolic and diastolic phases were analyzed on a dedicated work station using MPR, MIP and VRT modes. The patient received 80 cc of contrast. FINDINGS: Aortic Valve: Bicuspid Sievers type one with raphe betwenn right and left cusps. Calcium score 868 Aorta: Normal arch vessels no aneurysm. Moderate calcific atherosclerosis Sinotubular Junction: 26 mm Ascending Thoracic Aorta: 31 mm Aortic Arch: 23 mm Descending Thoracic Aorta: 24 mm Sinus of Valsalva Measurements: Non-coronary: 29.9 mm Right - coronary: 26.5 mm  Right sinus Height 16.1 mm Left - coronary: 26.8 mmLeft sinus height 15 mm Coronary Artery Height above Annulus: Left Main: 10.5 mm above annulus Right Coronary: 10.5 mm above annulus Virtual Basal Annulus Measurements: Maximum/Minimum Diameter: 21.4 mm x 25 mm Perimeter: 75.5 mm Area: 429 mm 2 Coronary Arteries: Somewhat shallow but adequate for deployment Optimum Fluoroscopic Angle for Delivery: RAO 2 Caudal 6 degrees IMPRESSION: 1. Bicuspid Sievers Type 1 AV with annular area of 429 mm2 upper limits of normal for a 23 mm Sapien 3 ultra valve. Given patients young age and fact her BMI is not very high this would seem reasonable. By perimeter she would measure for a 29 mm Metronic Evolut Pro valve however her sinus and linear diameter measurements seem small for this and again there would be concern for future access to her coronary arteries at such a young age of implant 2.  Somewhat shallow but adequate coronary height for deployment 3.  Optimum angiographic angle for deployment RAO 2 Caudal 6 degrees 4. No aortic aneurysm  or coarctation ascending thoracic aorta 3.1 cm Charlton Haws Electronically Signed   By: Charlton Haws M.D.   On: 05/01/2021 15:23   Result Date: 05/01/2021 EXAM: OVER-READ INTERPRETATION  CT CHEST The following report is an over-read performed by radiologist Dr. Trudie Reed of [facility] on 05/01/2021. This over-read does not include interpretation of cardiac or coronary anatomy or pathology. The coronary calcium score/coronary CTA interpretation by the cardiologist is attached. COMPARISON:  None. FINDINGS: Extracardiac findings will be described separately under dictation for contemporaneously obtained CTA chest, abdomen pelvis. IMPRESSION: Please see separate dictation for contemporaneously obtained CTA chest, abdomen and pelvis dated 05/01/2021 for full description of relevant extracardiac findings. Electronically Signed: By: Trudie Reed M.D. On: 05/01/2021 14:37   CT ANGIO CHEST AORTA W/CM & OR WO/CM  Result Date: 05/01/2021 CLINICAL DATA:  60 year old female with history of severe aortic stenosis. Preprocedural study prior to potential transcatheter aortic valve replacement (TAVR) procedure. EXAM: CT ANGIOGRAPHY CHEST, ABDOMEN AND PELVIS TECHNIQUE: Multidetector CT imaging through the chest, abdomen and pelvis was performed using the standard protocol during bolus administration of intravenous contrast. Multiplanar reconstructed images and MIPs were obtained and reviewed to evaluate the vascular anatomy. CONTRAST:  OMNIPAQUE IOHEXOL 350 MG/ML SOLN COMPARISON:  Chest CTA 04/29/2021. FINDINGS: CTA CHEST FINDINGS Cardiovascular: Heart size is normal. There is no significant pericardial fluid, thickening or pericardial calcification. Aortic atherosclerosis. No definite coronary artery calcifications. Severe thickening  and calcification of the aortic valve. Separate origin of the left vertebral artery directly off the aortic arch (normal anatomical variant) incidentally noted.  Mediastinum/Lymph Nodes: No pathologically enlarged mediastinal or hilar lymph nodes. Esophagus is unremarkable in appearance. No axillary lymphadenopathy. Lungs/Pleura: 9 x 6 mm pulmonary nodule in the right upper lobe (axial image 49 of series 5), stable compared to the most recent prior examination. No other definite suspicious appearing pulmonary nodules or masses are noted. No acute consolidative airspace disease. No pleural effusions. Diffuse bronchial wall thickening with mild centrilobular and paraseptal emphysema. Musculoskeletal/Soft Tissues: There are no aggressive appearing lytic or blastic lesions noted in the visualized portions of the skeleton. CTA ABDOMEN AND PELVIS FINDINGS Hepatobiliary: Chronic atrophy in the right lobe of the liver with chronic intrahepatic biliary ductal dilatation involving portions of segments 5, 6, 7 and 8, similar to remote prior CT the abdomen and pelvis 05/02/2009, likely related to chronic central biliary tract obstruction. Patient is status post cholecystectomy with multiple surgical clips, including one clip in the expected region of obstruction. No suspicious cystic or solid hepatic lesions. Pancreas: No pancreatic mass. No pancreatic ductal dilatation. No pancreatic or peripancreatic fluid collections or inflammatory changes. Spleen: Unremarkable. Adrenals/Urinary Tract: No suspicious renal lesions. Bilateral adrenal glands are normal in appearance. No hydroureteronephrosis. Urinary bladder is normal in appearance. Stomach/Bowel: The appearance of the stomach is normal. No pathologic dilatation of small bowel or colon. A few scattered colonic diverticulae are noted, most evident in the descending colon and sigmoid colon, without surrounding inflammatory changes to suggest an acute diverticulitis at this time. Normal appendix. Vascular/Lymphatic: Aortic atherosclerosis, without evidence of aneurysm or dissection in the abdominal or pelvic vasculature. Vascular findings  and measurements pertinent to potential TAVR procedure, as detailed below. No lymphadenopathy noted in the abdomen or pelvis. Reproductive: Uterus and ovaries are unremarkable in appearance. Other: No significant volume of ascites.  No pneumoperitoneum. Musculoskeletal: Bilateral pars defects are noted at L5 with 8 mm of anterolisthesis of L5 upon S1. There are no aggressive appearing lytic or blastic lesions noted in the visualized portions of the skeleton. VASCULAR MEASUREMENTS PERTINENT TO TAVR: AORTA: Minimal Aortic Diameter-12 x 9 mm Severity of Aortic Calcification-moderate RIGHT PELVIS: Right Common Iliac Artery - Minimal Diameter-7.4 x 6.5 mm Tortuosity-mild Calcification-moderate Right External Iliac Artery - Minimal Diameter-5.8 x 5.8 mm Tortuosity-mild-to-moderate Calcification-none Right Common Femoral Artery - Minimal Diameter-5.5 x 5.3 mm Tortuosity-mild Calcification-mild LEFT PELVIS: Left Common Iliac Artery - Minimal Diameter-6.8 x 7.2 mm Tortuosity-mild Calcification-mild Left External Iliac Artery - Minimal Diameter-5.7 x 5.1 mm Tortuosity-mild Calcification-minimal Left Common Femoral Artery - Minimal Diameter-6.0 x 5.5 mm Tortuosity-mild Calcification-mild Review of the MIP images confirms the above findings. IMPRESSION: 1. Vascular findings and measurements pertinent to potential TAVR procedure, as detailed above. 2. Severe thickening calcification of the aortic valve, compatible with reported clinical history of severe aortic stenosis. 3. Right upper lobe pulmonary nodule with a mean diameter of 8 mm. Non-contrast chest CT at 6-12 months is recommended. If the nodule is stable at time of repeat CT, then future CT at 18-24 months (from today's scan) is considered optional for low-risk patients, but is recommended for high-risk patients. This recommendation follows the consensus statement: Guidelines for Management of Incidental Pulmonary Nodules Detected on CT Images: From the Fleischner  Society 2017; Radiology 2017; 284:228-243. 4. Chronic atrophy in the right lobe of the liver likely related to chronic biliary tract obstruction (as evidence by chronic intrahepatic biliary ductal dilatation), similar to remote  prior study, as above. 5. Colonic diverticulosis without evidence of acute diverticulitis at this time. 6. Grade 1 spondylolisthesis of L5 upon S1. 7. Additional incidental findings, as above. Electronically Signed   By: Trudie Reed M.D.   On: 05/01/2021 15:39   VAS US DOPPLER PRE CABG  Result Date: 05/01/2021 PREOPERATIVE VASCULAR EVALUATION Patient Name:  JOVANA REMBOLD  Date of Exam:   05/01/2021 Medical Rec #: [id number]      Accession #:    [id number] Date of Birth: 12-Oct-1960     Patient Gender: F Patient Age:   32 years Exam Location:  [facility] Procedure:      VAS US DOPPLER PRE CABG Referring Phys: Lynnwood O'NEAL --------------------------------------------------------------------------------  Indications:      Pre-AVR. Risk Factors:     Current smoker. Other Factors:    Severe aortic stenosis. Comparison Study: No prior studies. Performing Technologist: Jean Rosenthal RDMS, RVT  Examination Guidelines: A complete evaluation includes B-mode imaging, spectral Doppler, color Doppler, and power Doppler as needed of all accessible portions of each vessel. Bilateral testing is considered an integral part of a complete examination. Limited examinations for reoccurring indications may be performed as noted.  Right Carotid Findings: +----------+--------+--------+--------+--------+------------------+           PSV cm/sEDV cm/sStenosisDescribeComments           +----------+--------+--------+--------+--------+------------------+ CCA Prox  71      19                                         +----------+--------+--------+--------+--------+------------------+ CCA Distal81      26                                          +----------+--------+--------+--------+--------+------------------+ ICA Prox  65      21                      intimal thickening +----------+--------+--------+--------+--------+------------------+ ICA Distal83      34                                         +----------+--------+--------+--------+--------+------------------+ ECA       91      14                                         +----------+--------+--------+--------+--------+------------------+ +----------+--------+-------+----------------+------------+           PSV cm/sEDV cmsDescribe        Arm Pressure +----------+--------+-------+----------------+------------+ Subclavian169            Multiphasic, WNL             +----------+--------+-------+----------------+------------+ +---------+--------+--+--------+--+---------+ VertebralPSV cm/s67EDV cm/s22Antegrade +---------+--------+--+--------+--+---------+ Left Carotid Findings: +----------+--------+--------+--------+--------+------------------+           PSV cm/sEDV cm/sStenosisDescribeComments           +----------+--------+--------+--------+--------+------------------+ CCA Prox  94      21                                         +----------+--------+--------+--------+--------+------------------+  CCA Distal75      18                                         +----------+--------+--------+--------+--------+------------------+ ICA Prox  49      19                      intimal thickening +----------+--------+--------+--------+--------+------------------+ ICA Distal53      23                                         +----------+--------+--------+--------+--------+------------------+ ECA       94      17                                         +----------+--------+--------+--------+--------+------------------+ +----------+--------+--------+----------------+------------+ SubclavianPSV cm/sEDV cm/sDescribe        Arm Pressure  +----------+--------+--------+----------------+------------+           121             Multiphasic, WNL             +----------+--------+--------+----------------+------------+ +---------+--------+--+--------+--+---------+ VertebralPSV cm/s33EDV cm/s12Antegrade +---------+--------+--+--------+--+---------+  ABI Findings: +--------+------------------+-----+---------+--------+ Right   Rt Pressure (mmHg)IndexWaveform Comment  +--------+------------------+-----+---------+--------+ Brachial                       triphasic         +--------+------------------+-----+---------+--------+ +--------+------------------+-----+---------+-------+ Left    Lt Pressure (mmHg)IndexWaveform Comment +--------+------------------+-----+---------+-------+ Brachial                       triphasic        +--------+------------------+-----+---------+-------+  Right Doppler Findings: +--------+--------+-----+---------+--------+ Site    PressureIndexDoppler  Comments +--------+--------+-----+---------+--------+ Brachial             triphasic         +--------+--------+-----+---------+--------+ Radial               triphasic         +--------+--------+-----+---------+--------+ Ulnar                triphasic         +--------+--------+-----+---------+--------+  Left Doppler Findings: +--------+--------+-----+---------+--------+ Site    PressureIndexDoppler  Comments +--------+--------+-----+---------+--------+ Brachial             triphasic         +--------+--------+-----+---------+--------+ Radial               triphasic         +--------+--------+-----+---------+--------+ Ulnar                biphasic          +--------+--------+-----+---------+--------+  Summary: Right Carotid: The extracranial vessels were near-normal with only minimal wall                thickening or plaque. Left Carotid: The extracranial vessels were near-normal with only minimal wall                thickening or plaque. Vertebrals:  Bilateral vertebral arteries demonstrate antegrade flow. Subclavians: Normal flow hemodynamics were seen in bilateral subclavian              arteries. Right  Upper Extremity: Doppler waveform obliterate with right radial compression. Doppler waveforms remain within normal limits with right ulnar compression.  Left Upper Extremity: Doppler waveforms remain within normal limits with left radial compression. Doppler waveforms remain within normal limits with left ulnar compression.     Preliminary    CT ANGIO ABDOMEN PELVIS  W &/OR WO CONTRAST  Addendum Date: 05/01/2021   ADDENDUM REPORT: 05/01/2021 15:23 CLINICAL DATA:  Aortic stenosis EXAM: Cardiac TAVR CT TECHNIQUE: The patient was scanned on a Siemens Force 192 slice scanner. A 120 kV retrospective scan was triggered in the descending thoracic aorta at 111 HU's. Gantry rotation speed was 270 msecs and collimation was .9 mm. No beta blockade or nitro were given. The 3D data set was reconstructed in 5% intervals of the R-R cycle. Systolic and diastolic phases were analyzed on a dedicated work station using MPR, MIP and VRT modes. The patient received 80 cc of contrast. FINDINGS: Aortic Valve: Bicuspid Sievers type one with raphe betwenn right and left cusps. Calcium score 868 Aorta: Normal arch vessels no aneurysm. Moderate calcific atherosclerosis Sinotubular Junction: 26 mm Ascending Thoracic Aorta: 31 mm Aortic Arch: 23 mm Descending Thoracic Aorta: 24 mm Sinus of Valsalva Measurements: Non-coronary: 29.9 mm Right - coronary: 26.5 mm  Right sinus Height 16.1 mm Left - coronary: 26.8 mmLeft sinus height 15 mm Coronary Artery Height above Annulus: Left Main: 10.5 mm above annulus Right Coronary: 10.5 mm above annulus Virtual Basal Annulus Measurements: Maximum/Minimum Diameter: 21.4 mm x 25 mm Perimeter: 75.5 mm Area: 429 mm 2 Coronary Arteries: Somewhat shallow but adequate for deployment Optimum Fluoroscopic Angle  for Delivery: RAO 2 Caudal 6 degrees IMPRESSION: 1. Bicuspid Sievers Type 1 AV with annular area of 429 mm2 upper limits of normal for a 23 mm Sapien 3 ultra valve. Given patients young age and fact her BMI is not very high this would seem reasonable. By perimeter she would measure for a 29 mm Metronic Evolut Pro valve however her sinus and linear diameter measurements seem small for this and again there would be concern for future access to her coronary arteries at such a young age of implant 2.  Somewhat shallow but adequate coronary height for deployment 3.  Optimum angiographic angle for deployment RAO 2 Caudal 6 degrees 4. No aortic aneurysm or coarctation ascending thoracic aorta 3.1 cm Charlton Haws Electronically Signed   By: Charlton Haws M.D.   On: 05/01/2021 15:23   Result Date: 05/01/2021 EXAM: OVER-READ INTERPRETATION  CT CHEST The following report is an over-read performed by radiologist Dr. Trudie Reed of [facility] on 05/01/2021. This over-read does not include interpretation of cardiac or coronary anatomy or pathology. The coronary calcium score/coronary CTA interpretation by the cardiologist is attached. COMPARISON:  None. FINDINGS: Extracardiac findings will be described separately under dictation for contemporaneously obtained CTA chest, abdomen pelvis. IMPRESSION: Please see separate dictation for contemporaneously obtained CTA chest, abdomen and pelvis dated 05/01/2021 for full description of relevant extracardiac findings. Electronically Signed: By: Trudie Reed M.D. On: 05/01/2021 14:37    Cardiac Studies  TTE 04/29/2021  1. Left ventricular ejection fraction, by estimation, is 70 to 75%. The  left ventricle has hyperdynamic function. The left ventricle has no  regional wall motion abnormalities. Left ventricular diastolic parameters  were normal.   2. Right ventricular systolic function is normal. The right ventricular  size is normal. There is mildly elevated  pulmonary artery systolic  pressure.   3. The mitral valve  is normal in structure. No evidence of mitral valve  regurgitation. No evidence of mitral stenosis.   4. The aortic valve was not well visualized. There is moderate  calcification of the aortic valve. There is moderate thickening of the  aortic valve. Aortic valve regurgitation is not visualized. Severe aortic  valve stenosis. Aortic valve mean gradient  measures 40.2 mmHg. Aortic valve peak gradient measures 64.0 mmHg. Aortic  valve area, by VTI measures 0.84 cm.      DI 0.27   5. The inferior vena cava is normal in size with greater than 50%  respiratory variability, suggesting right atrial pressure of 3 mmHg.   Patient Profile  Crystal Silva is a 60 y.o. female with history of tobacco abuse who was admitted to [facility] on 04/29/2021 with shortness of breath secondary to COPD exacerbation as well as found to have severe aortic stenosis.  Assessment & Plan   #Severe aortic stenosis #Bicuspid aortic valve -Admitted with COPD exacerbation and found to have severe aortic stenosis. -Left heart cath completed. -Echo is consistent with severe AS as well as her murmur. -TAVR CT confirms a bicuspid aortic valve.  Family members will need to be screened.  She was counseled on this. -Carotid ultrasounds without significant disease. -She does have severe obstructive COPD per PFTs. -Evaluated by surgery.  Plans for surgical aortic valve replacement.  Will need dental evaluation as an outpatient. -From my standpoint she is stable for discharge.  Remains on steroids for COPD exacerbation so likely will stay in-house several more days. -We will arrange follow-up in [place] in 3 to 4 months.  It appears CT surgery will set her up for surgery in the next few weeks.   #Elevated LVEDP -Given one-time dose of Lasix.  Euvolemic. -No need for further diuresis.  [facility] will sign off.   Medication Recommendations:   None Other recommendations (labs, testing, etc): Outpatient dental evaluation. Follow up as an outpatient:  We will arrange hospital follow-up in 3-4 months with Cardiology in [place]. Follow-up with CTS.   For questions or updates, please contact [facility] Please consult [email or web address] for contact info under   Time Spent with Patient: I have spent a total of 25 minutes with patient reviewing hospital notes, telemetry, EKGs, labs and examining the patient as well as establishing an assessment and plan that was discussed with the patient.  > 50% of time was spent in direct patient care.    Signed, Lenna Gilford. Flora Lipps, MD, [facility] [place]  [facility]  05/02/2021 8:32 AM

## 2021-05-02 NOTE — Discharge Summary (Signed)
Physician Discharge Summary  Crystal Silva ZOX:[id number] DOB: [birth year] DOA: 04/28/2021  PCP: Patient, No Pcp Per (Inactive)  Admit date: 04/28/2021 Discharge date: 05/02/2021  Admitted From: no Disposition:  none  Recommendations for Outpatient Follow-up:  Follow up with CT surgery in 1-2 weeks Follow-up with dental surgery this week or early next week for dental extraction. Please obtain BMP/CBC in one week Repeat CT scan of the chest in 6 months to follow-up on pulmonary noncalcified nodules 7 mm   Home Health:none  Equipment/Devices:None  Discharge Condition:none CODE STATUS:Full Diet recommendation: Heart Healthy   Brief/Interim Summary: 60 year old female known past medical history, tobacco user-1 pack daily comes to the hospital with dyspnea on exertion which has progressed over the last month.  She was admitted to the hospital for COPD exacerbation.  Echocardiogram ordered due to new diagnosis of systolic murmur.  CTA chest was negative for pulmonary embolism, showed moderate centrilobular emphysema.  There was also 7 mm noncalcified pulmonary nodule in the right upper lobe   Discharge Diagnoses:  Active Problems:   Chest pain   Tobacco use disorder   Elevated d-dimer   COPD (chronic obstructive pulmonary disease) (HCC)   Aortic valve disorder   COPD exacerbation (HCC)   DOE (dyspnea on exertion)   Severe aortic stenosis by prior echocardiogram  Acute respiratory failure with hypoxia secondary to COPD exacerbation: In the setting of ongoing tobacco abuse she was started on steroids inhalers and antibiotics she was weaned to room air. She will continue at home antibiotics and a steroid taper.  Severe aortic stenosis: Cardiology was consulted and recommended left and right heart cath which was unremarkable for coronary artery disease. She was started on metoprolol. CT surgery was consulted who recommended dental evaluation with for proceeding with mitral valve  replacement. Will follow-up as an outpatient. The patient relates that she was get her dental extraction as an outpatient.  Pulmonary nodule, 7 mm noncalcified, Will need repeat repeat CT scan in 6 to 12 months.  Discharge Instructions  Discharge Instructions     Diet - low sodium heart healthy   Complete by: As directed    Increase activity slowly   Complete by: As directed       Allergies as of 05/02/2021   No Known Allergies      Medication List     STOP taking these medications    ibuprofen 600 MG tablet Commonly known as: ADVIL   predniSONE 10 MG (21) Tbpk tablet Commonly known as: STERAPRED UNI-PAK 21 TAB Replaced by: predniSONE 10 MG tablet       TAKE these medications    azithromycin 250 MG tablet Commonly known as: ZITHROMAX Take one tab daily Start taking on: May 03, 2021 What changed:  how much to take how to take this when to take this additional instructions   desloratadine 5 MG tablet Commonly known as: CLARINEX Take 1 tablet (5 mg total) by mouth daily as needed.   HYDROcodone-acetaminophen 5-325 MG tablet Commonly known as: NORCO/VICODIN Take 1 tablet by mouth every 4 (four) hours as needed.   predniSONE 10 MG tablet Commonly known as: DELTASONE Takes  2 tabs for 1 days, then 1 tab for 1 days, and then stop. Replaces: predniSONE 10 MG (21) Tbpk tablet        No Known Allergies  Consultations: Cardiology CT surgery   Procedures/Studies: DG Orthopantogram  Result Date: 05/01/2021 CLINICAL DATA:  Preoperative evaluation prior to heart surgery. EXAM: ORTHOPANTOGRAM/PANORAMIC COMPARISON:  None. FINDINGS:  Maxilla is completely edentulous without evidence of retained fragment. Mandible shows residual teeth numbers 21 through 26. No evidence of dental decay or periodontal disease. No residual root fragments elsewhere. IMPRESSION: Edentulous with the exception of mandibular teeth 21 through 26. No evidence of dental decay or  periodontal disease. Electronically Signed   By: Paulina Fusi M.D.   On: 05/01/2021 13:48   DG Chest 2 View  Result Date: 04/28/2021 CLINICAL DATA:  Shortness of breath EXAM: CHEST - 2 VIEW COMPARISON:  04/06/2020 FINDINGS: Hyperinflated lungs. The heart size and mediastinal contours are within normal limits. No focal pulmonary opacity. No pleural effusion or pneumothorax. No acute osseous abnormality. Right upper quadrant surgical clips. IMPRESSION: No acute cardiopulmonary disease. Electronically Signed   By: Wiliam Ke M.D.   On: 04/28/2021 22:27   CT Angio Chest PE W and/or Wo Contrast  Result Date: 04/29/2021 CLINICAL DATA:  PE suspected, low/intermediate prob, positive D-dimer. Dyspnea. EXAM: CT ANGIOGRAPHY CHEST WITH CONTRAST TECHNIQUE: Multidetector CT imaging of the chest was performed using the standard protocol during bolus administration of intravenous contrast. Multiplanar CT image reconstructions and MIPs were obtained to evaluate the vascular anatomy. CONTRAST:  75mL OMNIPAQUE IOHEXOL 350 MG/ML SOLN COMPARISON:  None. FINDINGS: Cardiovascular: There is adequate opacification of the pulmonary arterial tree. No intraluminal filling defect identified to suggest acute pulmonary embolism. The central pulmonary arteries are of normal caliber. There is calcification of the aortic valve leaflets noted. Global cardiac size within normal limits. No significant coronary artery calcification. No pericardial effusion. Mild atherosclerotic calcification noted within the thoracic aorta. No aortic aneurysm. Mediastinum/Nodes: No enlarged mediastinal, hilar, or axillary lymph nodes. Thyroid gland and esophagus demonstrate no significant findings. Lungs/Pleura: Moderate centrilobular emphysema. The lungs appear mildly hyperinflated. Mild diffuse bronchial wall thickening is in keeping with airway inflammation. Mean 7 mm noncalcified pulmonary nodule is seen within the right upper lobe, axial image # 67/6,  indeterminate. No focal pulmonary infiltrate. No pneumothorax or pleural effusion. No central obstructing mass. Layering debris is seen within the distal trachea. Upper Abdomen: Limited images of the upper abdomen demonstrates severe progressive atrophy of the right hepatic lobe and compensatory hypertrophy of the left hepatic lobe when compared to prior examination of 05/02/2009. Surgical clips are seen within the hepatic hilum, of unclear significance. Stable intrahepatic biliary ductal dilation within the atrophic right hepatic lobe. No acute abnormality. Musculoskeletal: Degenerative changes are seen within the thoracic spine. No lytic or blastic bone lesions are seen. Remote T12 superior endplate fracture. Review of the MIP images confirms the above findings. IMPRESSION: No pulmonary embolism. Moderate centrilobular emphysema. Mild bronchial wall thickening in keeping with chronic airway inflammation. 7 mm noncalcified indeterminate pulmonary nodule within the right upper lobe. Non-contrast chest CT at 6-12 months is recommended. If the nodule is stable at time of repeat CT, then future CT at 18-24 months (from today's scan) is considered optional for low-risk patients, but is recommended for high-risk patients. This recommendation follows the consensus statement: Guidelines for Management of Incidental Pulmonary Nodules Detected on CT Images: From the Fleischner Society 2017; Radiology 2017; 284:228-243. Aortic valve calcification. Echocardiography may be helpful to assess the degree of valvular dysfunction. Progressive marked atrophy of the right hepatic lobe, likely related to chronic biliary obstruction, incompletely evaluated on this examination. This was better assessed on CT examination of 05/02/2009. Emphysema (ICD10-J43.9). Electronically Signed   By: Helyn Numbers M.D.   On: 04/29/2021 01:26   CARDIAC CATHETERIZATION  Result Date: 04/30/2021   LV  end diastolic pressure is moderately elevated.    Angiographically normal coronary arteries   Moderate Aortic Stenosis by Cath Gradient, Severe Aortic Stenosis by Echo SUMMARY Angiographically normal coronary arteries. Moderate Aortic Stenosis by cardiac cath, Severe Aortic Stenosis  by Echo. Moderately elevated LVEDP and PCWP with normal PAP. RECOMMENDATIONS Return to nursing unit for ongoing care. Anticipate TAVR team consultation.   CT CORONARY MORPH W/CTA COR W/SCORE W/CA W/CM &/OR WO/CM  Addendum Date: 05/01/2021   ADDENDUM REPORT: 05/01/2021 15:23 CLINICAL DATA:  Aortic stenosis EXAM: Cardiac TAVR CT TECHNIQUE: The patient was scanned on a Siemens Force 192 slice scanner. A 120 kV retrospective scan was triggered in the descending thoracic aorta at 111 HU's. Gantry rotation speed was 270 msecs and collimation was .9 mm. No beta blockade or nitro were given. The 3D data set was reconstructed in 5% intervals of the R-R cycle. Systolic and diastolic phases were analyzed on a dedicated work station using MPR, MIP and VRT modes. The patient received 80 cc of contrast. FINDINGS: Aortic Valve: Bicuspid Sievers type one with raphe betwenn right and left cusps. Calcium score 868 Aorta: Normal arch vessels no aneurysm. Moderate calcific atherosclerosis Sinotubular Junction: 26 mm Ascending Thoracic Aorta: 31 mm Aortic Arch: 23 mm Descending Thoracic Aorta: 24 mm Sinus of Valsalva Measurements: Non-coronary: 29.9 mm Right - coronary: 26.5 mm  Right sinus Height 16.1 mm Left - coronary: 26.8 mmLeft sinus height 15 mm Coronary Artery Height above Annulus: Left Main: 10.5 mm above annulus Right Coronary: 10.5 mm above annulus Virtual Basal Annulus Measurements: Maximum/Minimum Diameter: 21.4 mm x 25 mm Perimeter: 75.5 mm Area: 429 mm 2 Coronary Arteries: Somewhat shallow but adequate for deployment Optimum Fluoroscopic Angle for Delivery: RAO 2 Caudal 6 degrees IMPRESSION: 1. Bicuspid Sievers Type 1 AV with annular area of 429 mm2 upper limits of normal for a 23 mm  Sapien 3 ultra valve. Given patients young age and fact her BMI is not very high this would seem reasonable. By perimeter she would measure for a 29 mm Metronic Evolut Pro valve however her sinus and linear diameter measurements seem small for this and again there would be concern for future access to her coronary arteries at such a young age of implant 2.  Somewhat shallow but adequate coronary height for deployment 3.  Optimum angiographic angle for deployment RAO 2 Caudal 6 degrees 4. No aortic aneurysm or coarctation ascending thoracic aorta 3.1 cm Charlton Haws Electronically Signed   By: Charlton Haws M.D.   On: 05/01/2021 15:23   Result Date: 05/01/2021 EXAM: OVER-READ INTERPRETATION  CT CHEST The following report is an over-read performed by radiologist Dr. Trudie Reed of [facility] on 05/01/2021. This over-read does not include interpretation of cardiac or coronary anatomy or pathology. The coronary calcium score/coronary CTA interpretation by the cardiologist is attached. COMPARISON:  None. FINDINGS: Extracardiac findings will be described separately under dictation for contemporaneously obtained CTA chest, abdomen pelvis. IMPRESSION: Please see separate dictation for contemporaneously obtained CTA chest, abdomen and pelvis dated 05/01/2021 for full description of relevant extracardiac findings. Electronically Signed: By: Trudie Reed M.D. On: 05/01/2021 14:37   CT ANGIO CHEST AORTA W/CM & OR WO/CM  Result Date: 05/01/2021 CLINICAL DATA:  60 year old female with history of severe aortic stenosis. Preprocedural study prior to potential transcatheter aortic valve replacement (TAVR) procedure. EXAM: CT ANGIOGRAPHY CHEST, ABDOMEN AND PELVIS TECHNIQUE: Multidetector CT imaging through the chest, abdomen and pelvis was performed using the standard protocol during bolus  administration of intravenous contrast. Multiplanar reconstructed images and MIPs were obtained and reviewed to  evaluate the vascular anatomy. CONTRAST:  OMNIPAQUE IOHEXOL 350 MG/ML SOLN COMPARISON:  Chest CTA 04/29/2021. FINDINGS: CTA CHEST FINDINGS Cardiovascular: Heart size is normal. There is no significant pericardial fluid, thickening or pericardial calcification. Aortic atherosclerosis. No definite coronary artery calcifications. Severe thickening and calcification of the aortic valve. Separate origin of the left vertebral artery directly off the aortic arch (normal anatomical variant) incidentally noted. Mediastinum/Lymph Nodes: No pathologically enlarged mediastinal or hilar lymph nodes. Esophagus is unremarkable in appearance. No axillary lymphadenopathy. Lungs/Pleura: 9 x 6 mm pulmonary nodule in the right upper lobe (axial image 49 of series 5), stable compared to the most recent prior examination. No other definite suspicious appearing pulmonary nodules or masses are noted. No acute consolidative airspace disease. No pleural effusions. Diffuse bronchial wall thickening with mild centrilobular and paraseptal emphysema. Musculoskeletal/Soft Tissues: There are no aggressive appearing lytic or blastic lesions noted in the visualized portions of the skeleton. CTA ABDOMEN AND PELVIS FINDINGS Hepatobiliary: Chronic atrophy in the right lobe of the liver with chronic intrahepatic biliary ductal dilatation involving portions of segments 5, 6, 7 and 8, similar to remote prior CT the abdomen and pelvis 05/02/2009, likely related to chronic central biliary tract obstruction. Patient is status post cholecystectomy with multiple surgical clips, including one clip in the expected region of obstruction. No suspicious cystic or solid hepatic lesions. Pancreas: No pancreatic mass. No pancreatic ductal dilatation. No pancreatic or peripancreatic fluid collections or inflammatory changes. Spleen: Unremarkable. Adrenals/Urinary Tract: No suspicious renal lesions. Bilateral adrenal glands are normal in appearance. No  hydroureteronephrosis. Urinary bladder is normal in appearance. Stomach/Bowel: The appearance of the stomach is normal. No pathologic dilatation of small bowel or colon. A few scattered colonic diverticulae are noted, most evident in the descending colon and sigmoid colon, without surrounding inflammatory changes to suggest an acute diverticulitis at this time. Normal appendix. Vascular/Lymphatic: Aortic atherosclerosis, without evidence of aneurysm or dissection in the abdominal or pelvic vasculature. Vascular findings and measurements pertinent to potential TAVR procedure, as detailed below. No lymphadenopathy noted in the abdomen or pelvis. Reproductive: Uterus and ovaries are unremarkable in appearance. Other: No significant volume of ascites.  No pneumoperitoneum. Musculoskeletal: Bilateral pars defects are noted at L5 with 8 mm of anterolisthesis of L5 upon S1. There are no aggressive appearing lytic or blastic lesions noted in the visualized portions of the skeleton. VASCULAR MEASUREMENTS PERTINENT TO TAVR: AORTA: Minimal Aortic Diameter-12 x 9 mm Severity of Aortic Calcification-moderate RIGHT PELVIS: Right Common Iliac Artery - Minimal Diameter-7.4 x 6.5 mm Tortuosity-mild Calcification-moderate Right External Iliac Artery - Minimal Diameter-5.8 x 5.8 mm Tortuosity-mild-to-moderate Calcification-none Right Common Femoral Artery - Minimal Diameter-5.5 x 5.3 mm Tortuosity-mild Calcification-mild LEFT PELVIS: Left Common Iliac Artery - Minimal Diameter-6.8 x 7.2 mm Tortuosity-mild Calcification-mild Left External Iliac Artery - Minimal Diameter-5.7 x 5.1 mm Tortuosity-mild Calcification-minimal Left Common Femoral Artery - Minimal Diameter-6.0 x 5.5 mm Tortuosity-mild Calcification-mild Review of the MIP images confirms the above findings. IMPRESSION: 1. Vascular findings and measurements pertinent to potential TAVR procedure, as detailed above. 2. Severe thickening calcification of the aortic valve,  compatible with reported clinical history of severe aortic stenosis. 3. Right upper lobe pulmonary nodule with a mean diameter of 8 mm. Non-contrast chest CT at 6-12 months is recommended. If the nodule is stable at time of repeat CT, then future CT at 18-24 months (from today's scan) is considered optional for low-risk patients,  but is recommended for high-risk patients. This recommendation follows the consensus statement: Guidelines for Management of Incidental Pulmonary Nodules Detected on CT Images: From the Fleischner Society 2017; Radiology 2017; 284:228-243. 4. Chronic atrophy in the right lobe of the liver likely related to chronic biliary tract obstruction (as evidence by chronic intrahepatic biliary ductal dilatation), similar to remote prior study, as above. 5. Colonic diverticulosis without evidence of acute diverticulitis at this time. 6. Grade 1 spondylolisthesis of L5 upon S1. 7. Additional incidental findings, as above. Electronically Signed   By: Trudie Reed M.D.   On: 05/01/2021 15:39   ECHOCARDIOGRAM COMPLETE  Result Date: 04/29/2021    ECHOCARDIOGRAM REPORT   Patient Name:   Crystal Silva Date of Exam: 04/29/2021 Medical Rec #:  [id number]     Height:       69.0 in Accession #:    [id number]    Weight:       163.3 lb Date of Birth:  1961-01-01    BSA:          1.895 m Patient Age:    59 years      BP:           137/73 mmHg Patient Gender: F             HR:           70 bpm. [facility] Procedure: 2D Echo, Cardiac Doppler and Color Doppler Indications:    Aortic valve disorder  History:        Patient has no prior history of Echocardiogram examinations.                 COPD, Signs/Symptoms:Chest Pain; Risk Factors:Tobacco use.  Sonographer:    Mikki Harbor Referring Phys: [id number] ASIA B ZIERLE-GHOSH IMPRESSIONS  1. Left ventricular ejection fraction, by estimation, is 70 to 75%. The left ventricle has hyperdynamic function. The left ventricle has no regional wall motion  abnormalities. Left ventricular diastolic parameters were normal.  2. Right ventricular systolic function is normal. The right ventricular size is normal. There is mildly elevated pulmonary artery systolic pressure.  3. The mitral valve is normal in structure. No evidence of mitral valve regurgitation. No evidence of mitral stenosis.  4. The aortic valve was not well visualized. There is moderate calcification of the aortic valve. There is moderate thickening of the aortic valve. Aortic valve regurgitation is not visualized. Severe aortic valve stenosis. Aortic valve mean gradient measures 40.2 mmHg. Aortic valve peak gradient measures 64.0 mmHg. Aortic valve area, by VTI measures 0.84 cm.     DI 0.27  5. The inferior vena cava is normal in size with greater than 50% respiratory variability, suggesting right atrial pressure of 3 mmHg. FINDINGS  Left Ventricle: Left ventricular ejection fraction, by estimation, is 70 to 75%. The left ventricle has hyperdynamic function. The left ventricle has no regional wall motion abnormalities. The left ventricular internal cavity size was normal in size. There is no left ventricular hypertrophy. Left ventricular diastolic parameters were normal. Right Ventricle: The right ventricular size is normal. No increase in right ventricular wall thickness. Right ventricular systolic function is normal. There is mildly elevated pulmonary artery systolic pressure. The tricuspid regurgitant velocity is 2.69  m/s, and with an assumed right atrial pressure of 8 mmHg, the estimated right ventricular systolic pressure is 36.9 mmHg. Left Atrium: Left atrial size was normal in size. Right Atrium: Right atrial size was normal in size. Pericardium: There is  no evidence of pericardial effusion. Mitral Valve: The mitral valve is normal in structure. No evidence of mitral valve regurgitation. No evidence of mitral valve stenosis. MV peak gradient, 6.2 mmHg. The mean mitral valve gradient is 2.0 mmHg.  Tricuspid Valve: The tricuspid valve is normal in structure. Tricuspid valve regurgitation is mild . No evidence of tricuspid stenosis. Aortic Valve: The aortic valve was not well visualized. There is moderate calcification of the aortic valve. There is moderate thickening of the aortic valve. There is moderate aortic valve annular calcification. Aortic valve regurgitation is not visualized. Severe aortic stenosis is present. Aortic valve mean gradient measures 40.2 mmHg. Aortic valve peak gradient measures 64.0 mmHg. Aortic valve area, by VTI measures 0.84 cm. Pulmonic Valve: The pulmonic valve was not well visualized. Pulmonic valve regurgitation is not visualized. No evidence of pulmonic stenosis. Aorta: The aortic root is normal in size and structure. Venous: The inferior vena cava is normal in size with greater than 50% respiratory variability, suggesting right atrial pressure of 3 mmHg. IAS/Shunts: The interatrial septum was not well visualized.  LEFT VENTRICLE PLAX 2D LVIDd:         4.40 cm  Diastology LVIDs:         2.50 cm  LV e' medial:    9.14 cm/s LV PW:         0.80 cm  LV E/e' medial:  8.6 LV IVS:        0.90 cm  LV e' lateral:   10.10 cm/s LVOT diam:     2.00 cm  LV E/e' lateral: 7.8 LV SV:         80 LV SV Index:   42 LVOT Area:     3.14 cm  RIGHT VENTRICLE RV Basal diam:  2.65 cm RV Mid diam:    2.30 cm RV S prime:     14.80 cm/s TAPSE (M-mode): 2.6 cm LEFT ATRIUM             Index       RIGHT ATRIUM           Index LA diam:        3.10 cm 1.64 cm/m  RA Area:     11.70 cm LA Vol (A2C):   45.4 ml 23.95 ml/m RA Volume:   26.20 ml  13.82 ml/m LA Vol (A4C):   26.8 ml 14.14 ml/m LA Biplane Vol: 38.6 ml 20.36 ml/m  AORTIC VALVE AV Area (Vmax):    0.81 cm AV Area (Vmean):   0.81 cm AV Area (VTI):     0.84 cm AV Vmax:           400.00 cm/s AV Vmean:          289.970 cm/s AV VTI:            0.958 m AV Peak Grad:      64.0 mmHg AV Mean Grad:      40.2 mmHg LVOT Vmax:         103.00 cm/s LVOT Vmean:         74.800 cm/s LVOT VTI:          0.256 m LVOT/AV VTI ratio: 0.27  AORTA Ao Root diam: 3.20 cm Ao Asc diam:  3.00 cm MITRAL VALVE               TRICUSPID VALVE MV Area (PHT): 2.81 cm    TR Peak grad:   28.9 mmHg MV Area VTI:   2.62  cm    TR Vmax:        269.00 cm/s MV Peak grad:  6.2 mmHg MV Mean grad:  2.0 mmHg    SHUNTS MV Vmax:       1.24 m/s    Systemic VTI:  0.26 m MV Vmean:      65.6 cm/s   Systemic Diam: 2.00 cm MV Decel Time: 270 msec MV E velocity: 78.50 cm/s MV A velocity: 87.70 cm/s MV E/A ratio:  0.90 Dina Rich MD Electronically signed by Dina Rich MD Signature Date/Time: 04/29/2021/4:08:19 PM    Final    VAS US DOPPLER PRE CABG  Result Date: 05/01/2021 PREOPERATIVE VASCULAR EVALUATION Patient Name:  Crystal Silva  Date of Exam:   05/01/2021 Medical Rec #: [id number]      Accession #:    [id number] Date of Birth: 1961/04/04     Patient Gender: F Patient Age:   43 years Exam Location[facility]  [facility] Procedure:      VAS US DOPPLER PRE CABG Referring Phys: Lakeland O'NEAL --------------------------------------------------------------------------------  Indications:      Pre-AVR. Risk Factors:     Current smoker. Other Factors:    Severe aortic stenosis. Comparison Study: No prior studies. Performing Technologist: Jean Rosenthal RDMS, RVT  Examination Guidelines: A complete evaluation includes B-mode imaging, spectral Doppler, color Doppler, and power Doppler as needed of all accessible portions of each vessel. Bilateral testing is considered an integral part of a complete examination. Limited examinations for reoccurring indications may be performed as noted.  Right Carotid Findings: +----------+--------+--------+--------+--------+------------------+           PSV cm/sEDV cm/sStenosisDescribeComments           +----------+--------+--------+--------+--------+------------------+ CCA Prox  71      19                                          +----------+--------+--------+--------+--------+------------------+ CCA Distal81      26                                         +----------+--------+--------+--------+--------+------------------+ ICA Prox  65      21                      intimal thickening +----------+--------+--------+--------+--------+------------------+ ICA Distal83      34                                         +----------+--------+--------+--------+--------+------------------+ ECA       91      14                                         +----------+--------+--------+--------+--------+------------------+ +----------+--------+-------+----------------+------------+           PSV cm/sEDV cmsDescribe        Arm Pressure +----------+--------+-------+----------------+------------+ Subclavian169            Multiphasic, WNL             +----------+--------+-------+----------------+------------+ +---------+--------+--+--------+--+---------+ VertebralPSV cm/s67EDV cm/s22Antegrade +---------+--------+--+--------+--+---------+ Left Carotid Findings: +----------+--------+--------+--------+--------+------------------+  PSV cm/sEDV cm/sStenosisDescribeComments           +----------+--------+--------+--------+--------+------------------+ CCA Prox  94      21                                         +----------+--------+--------+--------+--------+------------------+ CCA Distal75      18                                         +----------+--------+--------+--------+--------+------------------+ ICA Prox  49      19                      intimal thickening +----------+--------+--------+--------+--------+------------------+ ICA Distal53      23                                         +----------+--------+--------+--------+--------+------------------+ ECA       94      17                                          +----------+--------+--------+--------+--------+------------------+ +----------+--------+--------+----------------+------------+ SubclavianPSV cm/sEDV cm/sDescribe        Arm Pressure +----------+--------+--------+----------------+------------+           121             Multiphasic, WNL             +----------+--------+--------+----------------+------------+ +---------+--------+--+--------+--+---------+ VertebralPSV cm/s33EDV cm/s12Antegrade +---------+--------+--+--------+--+---------+  ABI Findings: +--------+------------------+-----+---------+--------+ Right   Rt Pressure (mmHg)IndexWaveform Comment  +--------+------------------+-----+---------+--------+ Brachial                       triphasic         +--------+------------------+-----+---------+--------+ +--------+------------------+-----+---------+-------+ Left    Lt Pressure (mmHg)IndexWaveform Comment +--------+------------------+-----+---------+-------+ Brachial                       triphasic        +--------+------------------+-----+---------+-------+  Right Doppler Findings: +--------+--------+-----+---------+--------+ Site    PressureIndexDoppler  Comments +--------+--------+-----+---------+--------+ Brachial             triphasic         +--------+--------+-----+---------+--------+ Radial               triphasic         +--------+--------+-----+---------+--------+ Ulnar                triphasic         +--------+--------+-----+---------+--------+  Left Doppler Findings: +--------+--------+-----+---------+--------+ Site    PressureIndexDoppler  Comments +--------+--------+-----+---------+--------+ Brachial             triphasic         +--------+--------+-----+---------+--------+ Radial               triphasic         +--------+--------+-----+---------+--------+ Ulnar                biphasic          +--------+--------+-----+---------+--------+  Summary: Right  Carotid: The extracranial vessels were near-normal with only minimal wall  thickening or plaque. Left Carotid: The extracranial vessels were near-normal with only minimal wall               thickening or plaque. Vertebrals:  Bilateral vertebral arteries demonstrate antegrade flow. Subclavians: Normal flow hemodynamics were seen in bilateral subclavian              arteries. Right Upper Extremity: Doppler waveform obliterate with right radial compression. Doppler waveforms remain within normal limits with right ulnar compression.  Left Upper Extremity: Doppler waveforms remain within normal limits with left radial compression. Doppler waveforms remain within normal limits with left ulnar compression.     Preliminary    CT ANGIO ABDOMEN PELVIS  W &/OR WO CONTRAST  Addendum Date: 05/01/2021   ADDENDUM REPORT: 05/01/2021 15:23 CLINICAL DATA:  Aortic stenosis EXAM: Cardiac TAVR CT TECHNIQUE: The patient was scanned on a Siemens Force 192 slice scanner. A 120 kV retrospective scan was triggered in the descending thoracic aorta at 111 HU's. Gantry rotation speed was 270 msecs and collimation was .9 mm. No beta blockade or nitro were given. The 3D data set was reconstructed in 5% intervals of the R-R cycle. Systolic and diastolic phases were analyzed on a dedicated work station using MPR, MIP and VRT modes. The patient received 80 cc of contrast. FINDINGS: Aortic Valve: Bicuspid Sievers type one with raphe betwenn right and left cusps. Calcium score 868 Aorta: Normal arch vessels no aneurysm. Moderate calcific atherosclerosis Sinotubular Junction: 26 mm Ascending Thoracic Aorta: 31 mm Aortic Arch: 23 mm Descending Thoracic Aorta: 24 mm Sinus of Valsalva Measurements: Non-coronary: 29.9 mm Right - coronary: 26.5 mm  Right sinus Height 16.1 mm Left - coronary: 26.8 mmLeft sinus height 15 mm Coronary Artery Height above Annulus: Left Main: 10.5 mm above annulus Right Coronary: 10.5 mm above annulus Virtual  Basal Annulus Measurements: Maximum/Minimum Diameter: 21.4 mm x 25 mm Perimeter: 75.5 mm Area: 429 mm 2 Coronary Arteries: Somewhat shallow but adequate for deployment Optimum Fluoroscopic Angle for Delivery: RAO 2 Caudal 6 degrees IMPRESSION: 1. Bicuspid Sievers Type 1 AV with annular area of 429 mm2 upper limits of normal for a 23 mm Sapien 3 ultra valve. Given patients young age and fact her BMI is not very high this would seem reasonable. By perimeter she would measure for a 29 mm Metronic Evolut Pro valve however her sinus and linear diameter measurements seem small for this and again there would be concern for future access to her coronary arteries at such a young age of implant 2.  Somewhat shallow but adequate coronary height for deployment 3.  Optimum angiographic angle for deployment RAO 2 Caudal 6 degrees 4. No aortic aneurysm or coarctation ascending thoracic aorta 3.1 cm Charlton Haws Electronically Signed   By: Charlton Haws M.D.   On: 05/01/2021 15:23   Result Date: 05/01/2021 EXAM: OVER-READ INTERPRETATION  CT CHEST The following report is an over-read performed by radiologist Dr. Trudie Reed of [facility] on 05/01/2021. This over-read does not include interpretation of cardiac or coronary anatomy or pathology. The coronary calcium score/coronary CTA interpretation by the cardiologist is attached. COMPARISON:  None. FINDINGS: Extracardiac findings will be described separately under dictation for contemporaneously obtained CTA chest, abdomen pelvis. IMPRESSION: Please see separate dictation for contemporaneously obtained CTA chest, abdomen and pelvis dated 05/01/2021 for full description of relevant extracardiac findings. Electronically Signed: By: Trudie Reed M.D. On: 05/01/2021 14:37   (Echo, Carotid, EGD, Colonoscopy, ERCP)    Subjective: No complaints  Discharge Exam: Vitals:   05/02/21 0810 05/02/21 1113  BP:  111/66  Pulse:  65  Resp:    Temp:  98.5 F (36.9  C)  SpO2: 97%    Vitals:   05/02/21 0424 05/02/21 0747 05/02/21 0810 05/02/21 1113  BP: 106/68 104/69  111/66  Pulse: 64 65  65  Resp: 18 19    Temp: 97.9 F (36.6 C) 98.2 F (36.8 C)  98.5 F (36.9 C)  TempSrc: Oral Oral  Oral  SpO2: 96%  97%   Weight: 70.9 kg     Height:        General: Pt is alert, awake, not in acute distress Cardiovascular: RRR, S1/S2 +, no rubs, no gallops Respiratory: CTA bilaterally, no wheezing, no rhonchi Abdominal: Soft, NT, ND, bowel sounds + Extremities: no edema, no cyanosis    The results of significant diagnostics from this hospitalization (including imaging, microbiology, ancillary and laboratory) are listed below for reference.     Microbiology: Recent Results (from the past 240 hour(s))  Resp Panel by RT-PCR (Flu A&B, Covid) Nasopharyngeal Swab     Status: None   Collection Time: 04/29/21 12:05 AM   Specimen: Nasopharyngeal Swab; Nasopharyngeal(NP) swabs in vial transport medium  Result Value Ref Range Status   SARS Coronavirus 2 by RT PCR NEGATIVE NEGATIVE Final    Comment: (NOTE) SARS-CoV-2 target nucleic acids are NOT DETECTED.  The SARS-CoV-2 RNA is generally detectable in upper respiratory specimens during the acute phase of infection. The lowest concentration of SARS-CoV-2 viral copies this assay can detect is 138 copies/mL. A negative result does not preclude SARS-Cov-2 infection and should not be used as the sole basis for treatment or other patient management decisions. A negative result may occur with  improper specimen collection/handling, submission of specimen other than nasopharyngeal swab, presence of viral mutation(s) within the areas targeted by this assay, and inadequate number of viral copies(<138 copies/mL). A negative result must be combined with clinical observations, patient history, and epidemiological information. The expected result is Negative.  Fact Sheet for Patients:   BloggerCourse.com  Fact Sheet for Healthcare Providers:  SeriousBroker.it  This test is no t yet approved or cleared by the [place] FDA and  has been authorized for detection and/or diagnosis of SARS-CoV-2 by FDA under an Emergency Use Authorization (EUA). This EUA will remain  in effect (meaning this test can be used) for the duration of the COVID-19 declaration under Section 564(b)(1) of the Act, 21 U.S.C.section 360bbb-3(b)(1), unless the authorization is terminated  or revoked sooner.       Influenza A by PCR NEGATIVE NEGATIVE Final   Influenza B by PCR NEGATIVE NEGATIVE Final    Comment: (NOTE) The Xpert Xpress SARS-CoV-2/FLU/RSV plus assay is intended as an aid in the diagnosis of influenza from Nasopharyngeal swab specimens and should not be used as a sole basis for treatment. Nasal washings and aspirates are unacceptable for Xpert Xpress SARS-CoV-2/FLU/RSV testing.  Fact Sheet for Patients: BloggerCourse.com  Fact Sheet for Healthcare Providers: SeriousBroker.it  This test is not yet approved or cleared by the [place] FDA and has been authorized for detection and/or diagnosis of SARS-CoV-2 by FDA under an Emergency Use Authorization (EUA). This EUA will remain in effect (meaning this test can be used) for the duration of the COVID-19 declaration under Section 564(b)(1) of the Act, 21 U.S.C. section 360bbb-3(b)(1), unless the authorization is terminated or revoked.  Performed at [facility], [address], [place], [place] [address]  Labs: BNP (last 3 results) Recent Labs    04/29/21 0007  BNP 37.0   Basic Metabolic Panel: Recent Labs  Lab 04/29/21 0007 04/29/21 0505 04/30/21 0507 04/30/21 1451 04/30/21 1452 04/30/21 1457 04/30/21 1501 05/01/21 0208 05/02/21 0155  NA 138 139 138   < > 141 142 142 139 137  K 3.9 3.5 4.4   < > 4.1 4.1  4.0 4.5 3.9  CL 106 106 105  --   --   --   --  107 101  CO2 28 26 25   --   --   --   --  24 27  GLUCOSE 100* 152* 146*  --   --   --   --  113* 116*  BUN 15 14 18   --   --   --   --  17 23*  CREATININE 0.71 0.56 0.65  --   --   --   --  0.66 0.74  CALCIUM 8.3* 8.5* 9.2  --   --   --   --  9.0 8.9  MG  --  2.0 2.2  --   --   --   --  2.3 2.5*   < > = values in this interval not displayed.   Liver Function Tests: Recent Labs  Lab 04/29/21 0505  AST 12*  ALT 12  ALKPHOS 44  BILITOT 0.7  PROT 6.3*  ALBUMIN 3.7   No results for input(s): LIPASE, AMYLASE in the last 168 hours. No results for input(s): AMMONIA in the last 168 hours. CBC: Recent Labs  Lab 04/29/21 0007 04/29/21 0505 04/30/21 0507 04/30/21 1451 04/30/21 1452 04/30/21 1457 04/30/21 1501 05/01/21 0208 05/02/21 0155  WBC 6.8 5.8 8.8  --   --   --   --  7.6 5.9  NEUTROABS 3.6  --   --   --   --   --   --   --   --   HGB 16.2* 15.1* 15.3*   < > 13.6 13.6 13.3 14.2 14.4  HCT 49.2* 44.7 46.0   < > 40.0 40.0 39.0 41.9 42.1  MCV 99.2 96.3 98.1  --   --   --   --  95.4 94.4  PLT 154 140* 170  --   --   --   --  138* 148*   < > = values in this interval not displayed.   Cardiac Enzymes: No results for input(s): CKTOTAL, CKMB, CKMBINDEX, TROPONINI in the last 168 hours. BNP: Invalid input(s): POCBNP CBG: Recent Labs  Lab 05/01/21 1206 05/01/21 1553 05/01/21 2103 05/02/21 0752 05/02/21 1111  GLUCAP 137* 110* 109* 119* 128*   D-Dimer No results for input(s): DDIMER in the last 72 hours. Hgb A1c No results for input(s): HGBA1C in the last 72 hours. Lipid Profile No results for input(s): CHOL, HDL, LDLCALC, TRIG, CHOLHDL, LDLDIRECT in the last 72 hours. Thyroid function studies No results for input(s): TSH, T4TOTAL, T3FREE, THYROIDAB in the last 72 hours.  Invalid input(s): FREET3 Anemia work up No results for input(s): VITAMINB12, FOLATE, FERRITIN, TIBC, IRON, RETICCTPCT in the last 72  hours. Urinalysis    Component Value Date/Time   COLORURINE YELLOW 05/02/2009 1500   APPEARANCEUR CLEAR 05/02/2009 1500   LABSPEC 1.025 05/02/2009 1500   PHURINE 6.0 05/02/2009 1500   GLUCOSEU NEGATIVE 05/02/2009 1500   HGBUR TRACE (A) 05/02/2009 1500   BILIRUBINUR NEGATIVE 05/02/2009 1500   KETONESUR NEGATIVE 05/02/2009 1500   PROTEINUR NEGATIVE 05/02/2009  1500   UROBILINOGEN 0.2 05/02/2009 1500   NITRITE NEGATIVE 05/02/2009 1500   LEUKOCYTESUR NEGATIVE 05/02/2009 1500   Sepsis Labs Invalid input(s): PROCALCITONIN,  WBC,  LACTICIDVEN Microbiology Recent Results (from the past 240 hour(s))  Resp Panel by RT-PCR (Flu A&B, Covid) Nasopharyngeal Swab     Status: None   Collection Time: 04/29/21 12:05 AM   Specimen: Nasopharyngeal Swab; Nasopharyngeal(NP) swabs in vial transport medium  Result Value Ref Range Status   SARS Coronavirus 2 by RT PCR NEGATIVE NEGATIVE Final    Comment: (NOTE) SARS-CoV-2 target nucleic acids are NOT DETECTED.  The SARS-CoV-2 RNA is generally detectable in upper respiratory specimens during the acute phase of infection. The lowest concentration of SARS-CoV-2 viral copies this assay can detect is 138 copies/mL. A negative result does not preclude SARS-Cov-2 infection and should not be used as the sole basis for treatment or other patient management decisions. A negative result may occur with  improper specimen collection/handling, submission of specimen other than nasopharyngeal swab, presence of viral mutation(s) within the areas targeted by this assay, and inadequate number of viral copies(<138 copies/mL). A negative result must be combined with clinical observations, patient history, and epidemiological information. The expected result is Negative.  Fact Sheet for Patients:  BloggerCourse.com  Fact Sheet for Healthcare Providers:  SeriousBroker.it  This test is no t yet approved or cleared by  the [place] FDA and  has been authorized for detection and/or diagnosis of SARS-CoV-2 by FDA under an Emergency Use Authorization (EUA). This EUA will remain  in effect (meaning this test can be used) for the duration of the COVID-19 declaration under Section 564(b)(1) of the Act, 21 U.S.C.section 360bbb-3(b)(1), unless the authorization is terminated  or revoked sooner.       Influenza A by PCR NEGATIVE NEGATIVE Final   Influenza B by PCR NEGATIVE NEGATIVE Final    Comment: (NOTE) The Xpert Xpress SARS-CoV-2/FLU/RSV plus assay is intended as an aid in the diagnosis of influenza from Nasopharyngeal swab specimens and should not be used as a sole basis for treatment. Nasal washings and aspirates are unacceptable for Xpert Xpress SARS-CoV-2/FLU/RSV testing.  Fact Sheet for Patients: BloggerCourse.com  Fact Sheet for Healthcare Providers: SeriousBroker.it  This test is not yet approved or cleared by the [place] FDA and has been authorized for detection and/or diagnosis of SARS-CoV-2 by FDA under an Emergency Use Authorization (EUA). This EUA will remain in effect (meaning this test can be used) for the duration of the COVID-19 declaration under Section 564(b)(1) of the Act, 21 U.S.C. section 360bbb-3(b)(1), unless the authorization is terminated or revoked.  Performed at [facility], [address], [place], [place] [address]      Time coordinating discharge: Over 30 minutes  SIGNED:   Marinda Elk, MD  [facility] 05/02/2021, 11:34 AM Pager   If 7PM-7AM, please contact night-coverage [email or web address] Password [email or web address]

## 2021-05-02 NOTE — Progress Notes (Signed)
CARDIAC REHAB PHASE I   PRE:  Rate/Rhythm: 68 SR  BP:  Supine:   Sitting: 111/66  Standing:    SaO2:   MODE:  Ambulation: 600 ft   POST:  Rate/Rhythm: 81 SR  BP:  Supine:   Sitting: 123/79  Standing:    SaO2: 96%RA 1112-1135 Pt walked 600 ft on RA with steady gait and tolerated well. Pt hoping to go home soon.   Luetta Nutting, RN BSN  05/02/2021 11:32 AM

## 2021-05-03 ENCOUNTER — Encounter (INDEPENDENT_AMBULATORY_CARE_PROVIDER_SITE_OTHER): Payer: Self-pay

## 2021-05-08 ENCOUNTER — Telehealth: Payer: Self-pay | Admitting: Cardiology

## 2021-05-08 ENCOUNTER — Other Ambulatory Visit: Payer: Self-pay | Admitting: *Deleted

## 2021-05-08 ENCOUNTER — Encounter: Payer: Self-pay | Admitting: *Deleted

## 2021-05-08 DIAGNOSIS — I350 Nonrheumatic aortic (valve) stenosis: Secondary | ICD-10-CM

## 2021-05-08 DIAGNOSIS — Z0279 Encounter for issue of other medical certificate: Secondary | ICD-10-CM

## 2021-05-08 NOTE — Telephone Encounter (Signed)
New Message    Patient called they have her scheduled for surgery on 05/22/21 she would like the date on her FMLA to keep her out of work until after her surgery.  Ryan at the thoracic surgeon said that they would cover her paperwork after her surgery, but it is up to Dr Wyline Mood to decide if she is to be out of work until her actual surgery.

## 2021-05-08 NOTE — Telephone Encounter (Signed)
Pt dropped short term disability paperwork off at [facility] this afternoon to be filled out.   Please advise.

## 2021-05-08 NOTE — Telephone Encounter (Signed)
Forms from [employer] received on 05/08/21 Completed patient auth attached. Took form to MD Box for completion. Njm 05/08/21 gave to [facility]

## 2021-05-09 NOTE — Telephone Encounter (Signed)
Would be cleared for after surgery typically. What type of work does she do?   Dominga Ferry MD

## 2021-05-10 NOTE — Telephone Encounter (Signed)
Pt stated that she works for a [employer] and is on a computer most of the day.

## 2021-05-17 NOTE — Progress Notes (Signed)
Surgical Instructions   Your procedure is scheduled on Wednesday 05/22/2021.  Report to [facility] Main Entrance "A" at 06:30 A.M., then check in with the Admitting office.  Call [phone] if you have problems or questions between now and the morning of surgery:   Remember: Do not eat or drink after midnight the night before your surgery    If needed you may take these medications the morning of surgery: Acetaminophen (Tylenol) Mometasone-formoterol (Dulera)   As of today, STOP taking any Aspirin (unless otherwise instructed by your surgeon) or Aspirin-containing products; NSAIDS - Aleve, Naproxen, Ibuprofen, Motrin, Advil, Goody's, BC's, all herbal medications, fish oil, and all vitamins.   After your COVID test   You are not required to quarantine however you are required to wear a well-fitting mask when you are out and around people not in your household.  If your mask becomes wet or soiled, replace with a new one.  Wash your hands often with soap and water for 20 seconds or clean your hands with an alcohol-based hand sanitizer that contains at least 60% alcohol.  Do not share personal items.  Notify your provider: if you are in close contact with someone who has COVID  or if you develop a fever of 100.4 or greater, sneezing, cough, sore throat, shortness of breath or body aches.          Do not wear jewelry or makeup  Do not wear lotions, powders, perfumes/colognes, or deodorant.  Do not shave 48 hours prior to surgery.  Men may shave face and neck.  Do not wear nail polish, gel polish, artificial nails, or any other type of covering on natural nails including fingernails and toenails. If patients have artificial nails, gel coating, etc. that need to be removed by a nail salon please have this removed prior to surgery or surgery may need to be canceled/delayed if the surgeon/ anesthesia feels like the patient is unable to be adequately monitored.  Do not bring  valuables to the hospital - [facility] is not responsible for any belongings or valuables.              Do NOT Smoke (Tobacco/Vaping) or drink Alcohol 24 hours prior to your procedure  If you use a CPAP at night, you may bring all equipment for your overnight stay.   Contacts, glasses, hearing aids, dentures or partials may not be worn into surgery, please bring cases for these belongings   For patients admitted to the hospital, discharge time will be determined by your treatment team.   Patients discharged the day of surgery will not be allowed to drive home, and someone needs to stay with them for 24 hours.  NO VISITORS WILL BE ALLOWED IN PRE-OP WHERE PATIENTS ARE PREPPED FOR SURGERY.  ONLY 1 SUPPORT PERSON MAY BE PRESENT IN THE WAITING ROOM WHILE YOU ARE IN SURGERY.  IF YOU ARE TO BE ADMITTED, ONCE YOU ARE IN YOUR ROOM YOU WILL BE ALLOWED TWO (2) VISITORS. 1 (ONE) VISITOR MAY STAY OVERNIGHT BUT MUST ARRIVE TO THE ROOM BY 8pm.  Minor children may have two parents present. Special consideration for safety and communication needs will be reviewed on a case by case basis.  Special instructions:    Oral Hygiene is also important to reduce your risk of infection.  Remember - BRUSH YOUR TEETH THE MORNING OF SURGERY WITH YOUR REGULAR TOOTHPASTE   [place]- Preparing For Surgery  Before surgery, you can play an important role. Because  skin is not sterile, your skin needs to be as free of germs as possible. You can reduce the number of germs on your skin by washing with CHG (chlorahexidine gluconate) Soap before surgery.  CHG is an antiseptic cleaner which kills germs and bonds with the skin to continue killing germs even after washing.     Please do not use if you have an allergy to CHG or antibacterial soaps. If your skin becomes reddened/irritated stop using the CHG.  Do not shave (including legs and underarms) for at least 48 hours prior to first CHG shower. It is OK to shave your  face.  Please follow these instructions carefully.     Shower the NIGHT BEFORE SURGERY and the MORNING OF SURGERY with CHG Soap.   If you chose to wash your hair, wash your hair first as usual with your normal shampoo. After you shampoo, rinse your hair and body thoroughly to remove the shampoo.    Then [employer] and genitals (private parts) with your normal soap and rinse thoroughly to remove soap.  Next use the CHG Soap as you would any other liquid soap. You can apply CHG directly to the skin and wash gently with a clean washcloth.   Apply the CHG Soap to your body ONLY FROM THE NECK DOWN.  Do not use on open wounds or open sores. Avoid contact with your eyes, ears, mouth and genitals (private parts). Wash Face and genitals (private parts)  with your normal soap.   Wash thoroughly, paying special attention to the area where your surgery will be performed.  Thoroughly rinse your body with warm water from the neck down.  DO NOT shower/wash with your normal soap after using and rinsing off the CHG Soap.  Pat yourself dry with a CLEAN TOWEL.  Wear CLEAN PAJAMAS to bed the night before surgery  Place CLEAN SHEETS on your bed the night before your surgery  DO NOT SLEEP WITH PETS.   Day of Surgery:  Take a shower with CHG soap. Wear Clean/Comfortable clothing the morning of surgery Do not apply any deodorants/lotions.   Remember to brush your teeth WITH YOUR REGULAR TOOTHPASTE.   Please read over the following fact sheets that you were given.

## 2021-05-20 ENCOUNTER — Encounter (HOSPITAL_COMMUNITY)
Admission: RE | Admit: 2021-05-20 | Discharge: 2021-05-20 | Disposition: A | Discharge status: Home / Self Care | Payer: BC Managed Care – PPO | Source: Ambulatory Visit | Attending: Thoracic Surgery (Cardiothoracic Vascular Surgery) | Admitting: Thoracic Surgery (Cardiothoracic Vascular Surgery)

## 2021-05-20 ENCOUNTER — Ambulatory Visit (HOSPITAL_COMMUNITY)
Admission: RE | Admit: 2021-05-20 | Discharge: 2021-05-20 | Disposition: A | Discharge status: Home / Self Care | Payer: BC Managed Care – PPO | Source: Ambulatory Visit | Attending: Thoracic Surgery (Cardiothoracic Vascular Surgery) | Admitting: Thoracic Surgery (Cardiothoracic Vascular Surgery)

## 2021-05-20 ENCOUNTER — Other Ambulatory Visit: Payer: Self-pay

## 2021-05-20 ENCOUNTER — Encounter (HOSPITAL_COMMUNITY): Payer: Self-pay | Admitting: Thoracic Surgery (Cardiothoracic Vascular Surgery)

## 2021-05-20 DIAGNOSIS — I959 Hypotension, unspecified: Secondary | ICD-10-CM | POA: Diagnosis not present

## 2021-05-20 DIAGNOSIS — J9383 Other pneumothorax: Secondary | ICD-10-CM | POA: Diagnosis not present

## 2021-05-20 DIAGNOSIS — I350 Nonrheumatic aortic (valve) stenosis: Secondary | ICD-10-CM | POA: Insufficient documentation

## 2021-05-20 DIAGNOSIS — J939 Pneumothorax, unspecified: Secondary | ICD-10-CM | POA: Diagnosis not present

## 2021-05-20 DIAGNOSIS — J441 Chronic obstructive pulmonary disease with (acute) exacerbation: Secondary | ICD-10-CM | POA: Diagnosis not present

## 2021-05-20 DIAGNOSIS — Z8249 Family history of ischemic heart disease and other diseases of the circulatory system: Secondary | ICD-10-CM | POA: Diagnosis not present

## 2021-05-20 DIAGNOSIS — F17210 Nicotine dependence, cigarettes, uncomplicated: Secondary | ICD-10-CM | POA: Diagnosis not present

## 2021-05-20 DIAGNOSIS — Z01818 Encounter for other preprocedural examination: Secondary | ICD-10-CM | POA: Insufficient documentation

## 2021-05-20 DIAGNOSIS — Z20822 Contact with and (suspected) exposure to covid-19: Secondary | ICD-10-CM | POA: Insufficient documentation

## 2021-05-20 DIAGNOSIS — Z4682 Encounter for fitting and adjustment of non-vascular catheter: Secondary | ICD-10-CM | POA: Diagnosis not present

## 2021-05-20 DIAGNOSIS — I358 Other nonrheumatic aortic valve disorders: Secondary | ICD-10-CM | POA: Diagnosis not present

## 2021-05-20 DIAGNOSIS — Z452 Encounter for adjustment and management of vascular access device: Secondary | ICD-10-CM | POA: Diagnosis not present

## 2021-05-20 DIAGNOSIS — D6959 Other secondary thrombocytopenia: Secondary | ICD-10-CM | POA: Diagnosis not present

## 2021-05-20 DIAGNOSIS — I4891 Unspecified atrial fibrillation: Secondary | ICD-10-CM | POA: Diagnosis not present

## 2021-05-20 DIAGNOSIS — R079 Chest pain, unspecified: Secondary | ICD-10-CM | POA: Diagnosis not present

## 2021-05-20 DIAGNOSIS — J9811 Atelectasis: Secondary | ICD-10-CM | POA: Diagnosis not present

## 2021-05-20 DIAGNOSIS — R011 Cardiac murmur, unspecified: Secondary | ICD-10-CM | POA: Diagnosis not present

## 2021-05-20 DIAGNOSIS — Z952 Presence of prosthetic heart valve: Secondary | ICD-10-CM | POA: Diagnosis not present

## 2021-05-20 DIAGNOSIS — Z8679 Personal history of other diseases of the circulatory system: Secondary | ICD-10-CM | POA: Diagnosis not present

## 2021-05-20 DIAGNOSIS — J811 Chronic pulmonary edema: Secondary | ICD-10-CM | POA: Diagnosis not present

## 2021-05-20 DIAGNOSIS — I080 Rheumatic disorders of both mitral and aortic valves: Secondary | ICD-10-CM | POA: Diagnosis not present

## 2021-05-20 LAB — PROTIME-INR
INR: 1 (ref 0.8–1.2)
Prothrombin Time: 13 seconds (ref 11.4–15.2)

## 2021-05-20 LAB — URINALYSIS, ROUTINE W REFLEX MICROSCOPIC
Bacteria, UA: NONE SEEN
Bilirubin Urine: NEGATIVE
Glucose, UA: NEGATIVE mg/dL
Ketones, ur: NEGATIVE mg/dL
Nitrite: NEGATIVE
Protein, ur: NEGATIVE mg/dL
Specific Gravity, Urine: 1.019 (ref 1.005–1.030)
pH: 5 (ref 5.0–8.0)

## 2021-05-20 LAB — SURGICAL PCR SCREEN
MRSA, PCR: NEGATIVE
Staphylococcus aureus: NEGATIVE

## 2021-05-20 LAB — BLOOD GAS, ARTERIAL
Acid-base deficit: 0.5 mmol/L (ref 0.0–2.0)
Bicarbonate: 23.9 mmol/L (ref 20.0–28.0)
Drawn by: 58793
FIO2: 21
O2 Saturation: 97.7 %
Patient temperature: 37
pCO2 arterial: 41.3 mmHg (ref 32.0–48.0)
pH, Arterial: 7.38 (ref 7.350–7.450)
pO2, Arterial: 94.3 mmHg (ref 83.0–108.0)

## 2021-05-20 LAB — COMPREHENSIVE METABOLIC PANEL
ALT: 13 U/L (ref 0–44)
AST: 14 U/L — ABNORMAL LOW (ref 15–41)
Albumin: 3.6 g/dL (ref 3.5–5.0)
Alkaline Phosphatase: 38 U/L (ref 38–126)
Anion gap: 9 (ref 5–15)
BUN: 13 mg/dL (ref 6–20)
CO2: 22 mmol/L (ref 22–32)
Calcium: 8.9 mg/dL (ref 8.9–10.3)
Chloride: 106 mmol/L (ref 98–111)
Creatinine, Ser: 0.56 mg/dL (ref 0.44–1.00)
GFR, Estimated: >60 mL/min (ref >60)
Glucose, Bld: 97 mg/dL (ref 70–99)
Potassium: 4.1 mmol/L (ref 3.5–5.1)
Sodium: 137 mmol/L (ref 135–145)
Total Bilirubin: 0.4 mg/dL (ref 0.3–1.2)
Total Protein: 6.1 g/dL — ABNORMAL LOW (ref 6.5–8.1)

## 2021-05-20 LAB — CBC
HCT: 45.5 % (ref 36.0–46.0)
Hemoglobin: 15 g/dL (ref 12.0–15.0)
MCH: 32.5 pg (ref 26.0–34.0)
MCHC: 33 g/dL (ref 30.0–36.0)
MCV: 98.7 fL (ref 80.0–100.0)
Platelets: 149 10*3/uL — ABNORMAL LOW (ref 150–400)
RBC: 4.61 MIL/uL (ref 3.87–5.11)
RDW: 13.2 % (ref 11.5–15.5)
WBC: 5.5 10*3/uL (ref 4.0–10.5)
nRBC: 0 % (ref 0.0–0.2)

## 2021-05-20 LAB — TYPE AND SCREEN
ABO/RH(D): A NEG
Antibody Screen: NEGATIVE

## 2021-05-20 LAB — SARS CORONAVIRUS 2 (TAT 6-24 HRS): SARS Coronavirus 2: NEGATIVE

## 2021-05-20 LAB — HEMOGLOBIN A1C
Hgb A1c MFr Bld: 5.6 % (ref 4.8–5.6)
Mean Plasma Glucose: 114.02 mg/dL

## 2021-05-20 LAB — APTT: aPTT: 34 seconds (ref 24–36)

## 2021-05-20 NOTE — Progress Notes (Addendum)
PCP - none  Cardiologist - Dr. Gardiner Fanti  Endocrine-none  Pulm-no  Chest x-ray - 05/20/21  EKG - 04/30/21  Stress Test -   ECHO - 04/29/21  Cardiac Cath - 04/30/21  AICD-no PM-no LOOP-no  Dialysis-no  Sleep Study - no CPAP - no  LABS-CBC, CMP, PT, PTT, A1C, T?S, UA, PCT, Covid.  ASA-no  ERAS-no  HA1C- Fasting Blood Sugar - NA Checks Blood Sugar _NA__ times a day  Anesthesia-  Pt denies having chest pain, sob, or fever at this time. All instructions explained to the pt, with a verbal understanding of the material. Pt agrees to go over the instructions while at home for a better understanding. Pt also instructed to self quarantine after being tested for COVID-19. The opportunity to ask questions was provided.

## 2021-05-20 NOTE — Progress Notes (Signed)
PCP - none  Cardiologist - Dr. Gardiner Fanti  Endocrine-none  Pulm-no  Chest x-ray - 05/20/21  EKG - 04/30/21  Stress Test -   ECHO - 04/29/21  Cardiac Cath - 04/30/21  AICD-no PM-no LOOP-no  Dialysis-no  Sleep Study - no CPAP - no  LABS-CBC, CMP, PT, PTT, A1C, T?S, UA, PCT, Covid. I sent Darius Bump, RN  a message informing her that the urine showed large leukocytes. ASA-no  ERAS-no  HA1C- 5.6 Fasting Blood Sugar - NA Checks Blood Sugar _NA__ times a day  Anesthesia-  Pt denies having chest pain, sob, or fever at this time. All instructions explained to the pt, with a verbal understanding of the material. Pt agrees to go over the instructions while at home for a better understanding. Pt also instructed to self quarantine after being tested for COVID-19. The opportunity to ask questions was provided.

## 2021-05-21 MED ORDER — PHENYLEPHRINE HCL-NACL 20-0.9 MG/250ML-% IV SOLN
30.0000 ug/min | INTRAVENOUS | Status: AC
  Administered 2021-05-22: 40 ug/min via INTRAVENOUS
  Filled 2021-05-21: qty 250

## 2021-05-21 MED ORDER — MAGNESIUM SULFATE 50 % IJ SOLN
40.0000 meq | INTRAMUSCULAR | Status: DC
  Filled 2021-05-21: qty 9.85

## 2021-05-21 MED ORDER — EPINEPHRINE HCL 5 MG/250ML IV SOLN IN NS
0.0000 ug/min | INTRAVENOUS | Status: DC
  Filled 2021-05-21: qty 250

## 2021-05-21 MED ORDER — TRANEXAMIC ACID (OHS) BOLUS VIA INFUSION
15.0000 mg/kg | INTRAVENOUS | Status: AC
  Administered 2021-05-22: 1114.5 mg via INTRAVENOUS
  Filled 2021-05-21: qty 1115

## 2021-05-21 MED ORDER — VANCOMYCIN HCL 1250 MG/250ML IV SOLN
1250.0000 mg | INTRAVENOUS | Status: AC
  Administered 2021-05-22: 1250 mg via INTRAVENOUS
  Filled 2021-05-21: qty 250

## 2021-05-21 MED ORDER — TRANEXAMIC ACID (OHS) PUMP PRIME SOLUTION
2.0000 mg/kg | INTRAVENOUS | Status: DC
  Filled 2021-05-21: qty 1.49

## 2021-05-21 MED ORDER — MILRINONE LACTATE IN DEXTROSE 20-5 MG/100ML-% IV SOLN
0.3000 ug/kg/min | INTRAVENOUS | Status: DC
  Filled 2021-05-21: qty 100

## 2021-05-21 MED ORDER — CEFAZOLIN SODIUM-DEXTROSE 2-4 GM/100ML-% IV SOLN
2.0000 g | INTRAVENOUS | Status: DC
  Filled 2021-05-21: qty 100

## 2021-05-21 MED ORDER — PLASMA-LYTE A IV SOLN
INTRAVENOUS | Status: DC
  Filled 2021-05-21: qty 5

## 2021-05-21 MED ORDER — NITROGLYCERIN IN D5W 200-5 MCG/ML-% IV SOLN
2.0000 ug/min | INTRAVENOUS | Status: DC
  Filled 2021-05-21: qty 250

## 2021-05-21 MED ORDER — DEXMEDETOMIDINE HCL IN NACL 400 MCG/100ML IV SOLN
0.1000 ug/kg/h | INTRAVENOUS | Status: AC
  Administered 2021-05-22: .4 ug/kg/h via INTRAVENOUS
  Filled 2021-05-21: qty 100

## 2021-05-21 MED ORDER — CEFAZOLIN SODIUM-DEXTROSE 2-4 GM/100ML-% IV SOLN
2.0000 g | INTRAVENOUS | Status: AC
  Administered 2021-05-22 (×2): 2 g via INTRAVENOUS
  Filled 2021-05-21: qty 100

## 2021-05-21 MED ORDER — POTASSIUM CHLORIDE 2 MEQ/ML IV SOLN
80.0000 meq | INTRAVENOUS | Status: DC
  Filled 2021-05-21: qty 40

## 2021-05-21 MED ORDER — INSULIN REGULAR(HUMAN) IN NACL 100-0.9 UT/100ML-% IV SOLN
INTRAVENOUS | Status: AC
  Administered 2021-05-22: 1 [IU]/h via INTRAVENOUS
  Filled 2021-05-21: qty 100

## 2021-05-21 MED ORDER — HEPARIN 30,000 UNITS/1000 ML (OHS) CELLSAVER SOLUTION
Status: DC
  Filled 2021-05-21: qty 1000

## 2021-05-21 MED ORDER — TRANEXAMIC ACID 1000 MG/10ML IV SOLN
1.5000 mg/kg/h | INTRAVENOUS | Status: AC
  Administered 2021-05-22: 1.5 mg/kg/h via INTRAVENOUS
  Filled 2021-05-21: qty 25

## 2021-05-21 MED ORDER — NOREPINEPHRINE 4 MG/250ML-% IV SOLN
0.0000 ug/min | INTRAVENOUS | Status: DC
Start: 2021-05-22 — End: 2021-05-22
  Filled 2021-05-21: qty 250

## 2021-05-21 NOTE — Telephone Encounter (Signed)
Documents completed by AVincenza Hews 05/20/21.

## 2021-05-21 NOTE — Telephone Encounter (Signed)
Documents completed by A.Quinn on 05/20/21. Faxed to patients job on 05/21/21.

## 2021-05-22 ENCOUNTER — Other Ambulatory Visit: Payer: Self-pay | Admitting: Physician Assistant

## 2021-05-22 ENCOUNTER — Other Ambulatory Visit: Payer: Self-pay

## 2021-05-22 ENCOUNTER — Inpatient Hospital Stay (HOSPITAL_COMMUNITY): Payer: BC Managed Care – PPO

## 2021-05-22 ENCOUNTER — Inpatient Hospital Stay (HOSPITAL_COMMUNITY): Payer: BC Managed Care – PPO | Admitting: Certified Registered Nurse Anesthetist

## 2021-05-22 ENCOUNTER — Inpatient Hospital Stay (HOSPITAL_COMMUNITY)
Admission: RE | Admit: 2021-05-22 | Discharge: 2021-05-26 | DRG: 220 | Disposition: A | Discharge status: Home / Self Care | Payer: BC Managed Care – PPO | Attending: Thoracic Surgery (Cardiothoracic Vascular Surgery) | Admitting: Thoracic Surgery (Cardiothoracic Vascular Surgery)

## 2021-05-22 ENCOUNTER — Encounter (HOSPITAL_COMMUNITY)
Admission: RE | Disposition: A | Discharge status: Home / Self Care | Payer: Self-pay | Source: Home / Self Care | Attending: Thoracic Surgery (Cardiothoracic Vascular Surgery)

## 2021-05-22 ENCOUNTER — Encounter (HOSPITAL_COMMUNITY): Payer: Self-pay | Admitting: Thoracic Surgery (Cardiothoracic Vascular Surgery)

## 2021-05-22 DIAGNOSIS — J9383 Other pneumothorax: Secondary | ICD-10-CM | POA: Diagnosis not present

## 2021-05-22 DIAGNOSIS — Z952 Presence of prosthetic heart valve: Secondary | ICD-10-CM

## 2021-05-22 DIAGNOSIS — I350 Nonrheumatic aortic (valve) stenosis: Secondary | ICD-10-CM

## 2021-05-22 DIAGNOSIS — Z8249 Family history of ischemic heart disease and other diseases of the circulatory system: Secondary | ICD-10-CM

## 2021-05-22 DIAGNOSIS — I4891 Unspecified atrial fibrillation: Secondary | ICD-10-CM | POA: Diagnosis not present

## 2021-05-22 DIAGNOSIS — J939 Pneumothorax, unspecified: Secondary | ICD-10-CM

## 2021-05-22 DIAGNOSIS — Z01818 Encounter for other preprocedural examination: Secondary | ICD-10-CM

## 2021-05-22 DIAGNOSIS — F17210 Nicotine dependence, cigarettes, uncomplicated: Secondary | ICD-10-CM | POA: Diagnosis present

## 2021-05-22 DIAGNOSIS — I959 Hypotension, unspecified: Secondary | ICD-10-CM | POA: Diagnosis not present

## 2021-05-22 DIAGNOSIS — Z20822 Contact with and (suspected) exposure to covid-19: Secondary | ICD-10-CM | POA: Diagnosis present

## 2021-05-22 DIAGNOSIS — D6959 Other secondary thrombocytopenia: Secondary | ICD-10-CM | POA: Diagnosis not present

## 2021-05-22 HISTORY — DX: Headache, unspecified: R51.9

## 2021-05-22 HISTORY — PX: TEE WITHOUT CARDIOVERSION: SHX5443

## 2021-05-22 HISTORY — PX: AORTIC VALVE REPLACEMENT: SHX41

## 2021-05-22 HISTORY — DX: Cardiac murmur, unspecified: R01.1

## 2021-05-22 HISTORY — DX: Dyspnea, unspecified: R06.00

## 2021-05-22 LAB — POCT I-STAT, CHEM 8
BUN: 13 mg/dL (ref 6–20)
BUN: 12 mg/dL (ref 6–20)
BUN: 13 mg/dL (ref 6–20)
BUN: 13 mg/dL (ref 6–20)
Calcium, Ion: 1.23 mmol/L (ref 1.15–1.40)
Calcium, Ion: 1.02 mmol/L — ABNORMAL LOW (ref 1.15–1.40)
Calcium, Ion: 1.19 mmol/L (ref 1.15–1.40)
Calcium, Ion: 1.05 mmol/L — ABNORMAL LOW (ref 1.15–1.40)
Chloride: 105 mmol/L (ref 98–111)
Chloride: 103 mmol/L (ref 98–111)
Chloride: 103 mmol/L (ref 98–111)
Chloride: 104 mmol/L (ref 98–111)
Creatinine, Ser: 0.5 mg/dL (ref 0.44–1.00)
Creatinine, Ser: 0.4 mg/dL — ABNORMAL LOW (ref 0.44–1.00)
Creatinine, Ser: 0.5 mg/dL (ref 0.44–1.00)
Creatinine, Ser: 0.3 mg/dL — ABNORMAL LOW (ref 0.44–1.00)
Glucose, Bld: 105 mg/dL — ABNORMAL HIGH (ref 70–99)
Glucose, Bld: 118 mg/dL — ABNORMAL HIGH (ref 70–99)
Glucose, Bld: 120 mg/dL — ABNORMAL HIGH (ref 70–99)
Glucose, Bld: 119 mg/dL — ABNORMAL HIGH (ref 70–99)
HCT: 37 % (ref 36.0–46.0)
HCT: 26 % — ABNORMAL LOW (ref 36.0–46.0)
HCT: 36 % (ref 36.0–46.0)
HCT: 26 % — ABNORMAL LOW (ref 36.0–46.0)
Hemoglobin: 12.6 g/dL (ref 12.0–15.0)
Hemoglobin: 12.2 g/dL (ref 12.0–15.0)
Hemoglobin: 8.8 g/dL — ABNORMAL LOW (ref 12.0–15.0)
Hemoglobin: 8.8 g/dL — ABNORMAL LOW (ref 12.0–15.0)
Potassium: 4 mmol/L (ref 3.5–5.1)
Potassium: 3.8 mmol/L (ref 3.5–5.1)
Potassium: 4.6 mmol/L (ref 3.5–5.1)
Potassium: 4.4 mmol/L (ref 3.5–5.1)
Sodium: 141 mmol/L (ref 135–145)
Sodium: 137 mmol/L (ref 135–145)
Sodium: 139 mmol/L (ref 135–145)
Sodium: 139 mmol/L (ref 135–145)
TCO2: 27 mmol/L (ref 22–32)
TCO2: 25 mmol/L (ref 22–32)
TCO2: 26 mmol/L (ref 22–32)
TCO2: 26 mmol/L (ref 22–32)

## 2021-05-22 LAB — BASIC METABOLIC PANEL
Anion gap: 8 (ref 5–15)
BUN: 10 mg/dL (ref 6–20)
CO2: 22 mmol/L (ref 22–32)
Calcium: 7.5 mg/dL — ABNORMAL LOW (ref 8.9–10.3)
Chloride: 109 mmol/L (ref 98–111)
Creatinine, Ser: 0.51 mg/dL (ref 0.44–1.00)
GFR, Estimated: >60 mL/min (ref >60)
Glucose, Bld: 127 mg/dL — ABNORMAL HIGH (ref 70–99)
Potassium: 4.1 mmol/L (ref 3.5–5.1)
Sodium: 139 mmol/L (ref 135–145)

## 2021-05-22 LAB — POCT I-STAT 7, (LYTES, BLD GAS, ICA,H+H)
Acid-Base Excess: 0 mmol/L (ref 0.0–2.0)
Acid-Base Excess: 1 mmol/L (ref 0.0–2.0)
Acid-Base Excess: 3 mmol/L — ABNORMAL HIGH (ref 0.0–2.0)
Acid-Base Excess: 3 mmol/L — ABNORMAL HIGH (ref 0.0–2.0)
Acid-Base Excess: 2 mmol/L (ref 0.0–2.0)
Acid-base deficit: 3 mmol/L — ABNORMAL HIGH (ref 0.0–2.0)
Acid-base deficit: 4 mmol/L — ABNORMAL HIGH (ref 0.0–2.0)
Acid-base deficit: 3 mmol/L — ABNORMAL HIGH (ref 0.0–2.0)
Bicarbonate: 24.4 mmol/L (ref 20.0–28.0)
Bicarbonate: 26 mmol/L (ref 20.0–28.0)
Bicarbonate: 26.7 mmol/L (ref 20.0–28.0)
Bicarbonate: 27.8 mmol/L (ref 20.0–28.0)
Bicarbonate: 27.2 mmol/L (ref 20.0–28.0)
Bicarbonate: 24.5 mmol/L (ref 20.0–28.0)
Bicarbonate: 21.8 mmol/L (ref 20.0–28.0)
Bicarbonate: 22.7 mmol/L (ref 20.0–28.0)
Calcium, Ion: 0.89 mmol/L — CL (ref 1.15–1.40)
Calcium, Ion: 1.01 mmol/L — ABNORMAL LOW (ref 1.15–1.40)
Calcium, Ion: 1.02 mmol/L — ABNORMAL LOW (ref 1.15–1.40)
Calcium, Ion: 1.05 mmol/L — ABNORMAL LOW (ref 1.15–1.40)
Calcium, Ion: 1.08 mmol/L — ABNORMAL LOW (ref 1.15–1.40)
Calcium, Ion: 1.1 mmol/L — ABNORMAL LOW (ref 1.15–1.40)
Calcium, Ion: 1.09 mmol/L — ABNORMAL LOW (ref 1.15–1.40)
Calcium, Ion: 1.11 mmol/L — ABNORMAL LOW (ref 1.15–1.40)
HCT: 25 % — ABNORMAL LOW (ref 36.0–46.0)
HCT: 25 % — ABNORMAL LOW (ref 36.0–46.0)
HCT: 26 % — ABNORMAL LOW (ref 36.0–46.0)
HCT: 26 % — ABNORMAL LOW (ref 36.0–46.0)
HCT: 26 % — ABNORMAL LOW (ref 36.0–46.0)
HCT: 35 % — ABNORMAL LOW (ref 36.0–46.0)
HCT: 33 % — ABNORMAL LOW (ref 36.0–46.0)
HCT: 33 % — ABNORMAL LOW (ref 36.0–46.0)
Hemoglobin: 8.5 g/dL — ABNORMAL LOW (ref 12.0–15.0)
Hemoglobin: 8.5 g/dL — ABNORMAL LOW (ref 12.0–15.0)
Hemoglobin: 8.8 g/dL — ABNORMAL LOW (ref 12.0–15.0)
Hemoglobin: 8.8 g/dL — ABNORMAL LOW (ref 12.0–15.0)
Hemoglobin: 8.8 g/dL — ABNORMAL LOW (ref 12.0–15.0)
Hemoglobin: 11.9 g/dL — ABNORMAL LOW (ref 12.0–15.0)
Hemoglobin: 11.2 g/dL — ABNORMAL LOW (ref 12.0–15.0)
Hemoglobin: 11.2 g/dL — ABNORMAL LOW (ref 12.0–15.0)
O2 Saturation: 100 %
O2 Saturation: 100 %
O2 Saturation: 82 %
O2 Saturation: 100 %
O2 Saturation: 100 %
O2 Saturation: 100 %
O2 Saturation: 96 %
O2 Saturation: 98 %
Patient temperature: 35.1
Patient temperature: 37.2
Patient temperature: 36.5
Potassium: 3.9 mmol/L (ref 3.5–5.1)
Potassium: 4.5 mmol/L (ref 3.5–5.1)
Potassium: 4.6 mmol/L (ref 3.5–5.1)
Potassium: 4.7 mmol/L (ref 3.5–5.1)
Potassium: 4.4 mmol/L (ref 3.5–5.1)
Potassium: 4.1 mmol/L (ref 3.5–5.1)
Potassium: 4.1 mmol/L (ref 3.5–5.1)
Potassium: 4 mmol/L (ref 3.5–5.1)
Sodium: 137 mmol/L (ref 135–145)
Sodium: 140 mmol/L (ref 135–145)
Sodium: 142 mmol/L (ref 135–145)
Sodium: 137 mmol/L (ref 135–145)
Sodium: 139 mmol/L (ref 135–145)
Sodium: 139 mmol/L (ref 135–145)
Sodium: 140 mmol/L (ref 135–145)
Sodium: 141 mmol/L (ref 135–145)
TCO2: 26 mmol/L (ref 22–32)
TCO2: 27 mmol/L (ref 22–32)
TCO2: 28 mmol/L (ref 22–32)
TCO2: 29 mmol/L (ref 22–32)
TCO2: 29 mmol/L (ref 22–32)
TCO2: 26 mmol/L (ref 22–32)
TCO2: 23 mmol/L (ref 22–32)
TCO2: 24 mmol/L (ref 22–32)
pCO2 arterial: 37.7 mmHg (ref 32.0–48.0)
pCO2 arterial: 39.3 mmHg (ref 32.0–48.0)
pCO2 arterial: 43.3 mmHg (ref 32.0–48.0)
pCO2 arterial: 42.4 mmHg (ref 32.0–48.0)
pCO2 arterial: 46.2 mmHg (ref 32.0–48.0)
pCO2 arterial: 49.8 mmHg — ABNORMAL HIGH (ref 32.0–48.0)
pCO2 arterial: 42.2 mmHg (ref 32.0–48.0)
pCO2 arterial: 42.2 mmHg (ref 32.0–48.0)
pH, Arterial: 7.387 (ref 7.350–7.450)
pH, Arterial: 7.402 (ref 7.350–7.450)
pH, Arterial: 7.458 — ABNORMAL HIGH (ref 7.350–7.450)
pH, Arterial: 7.425 (ref 7.350–7.450)
pH, Arterial: 7.378 (ref 7.350–7.450)
pH, Arterial: 7.29 — ABNORMAL LOW (ref 7.350–7.450)
pH, Arterial: 7.322 — ABNORMAL LOW (ref 7.350–7.450)
pH, Arterial: 7.337 — ABNORMAL LOW (ref 7.350–7.450)
pO2, Arterial: 361 mmHg — ABNORMAL HIGH (ref 83.0–108.0)
pO2, Arterial: 421 mmHg — ABNORMAL HIGH (ref 83.0–108.0)
pO2, Arterial: 47 mmHg — ABNORMAL LOW (ref 83.0–108.0)
pO2, Arterial: 424 mmHg — ABNORMAL HIGH (ref 83.0–108.0)
pO2, Arterial: 321 mmHg — ABNORMAL HIGH (ref 83.0–108.0)
pO2, Arterial: 208 mmHg — ABNORMAL HIGH (ref 83.0–108.0)
pO2, Arterial: 86 mmHg (ref 83.0–108.0)
pO2, Arterial: 103 mmHg (ref 83.0–108.0)

## 2021-05-22 LAB — GLUCOSE, CAPILLARY
Glucose-Capillary: 103 mg/dL — ABNORMAL HIGH (ref 70–99)
Glucose-Capillary: 123 mg/dL — ABNORMAL HIGH (ref 70–99)
Glucose-Capillary: 114 mg/dL — ABNORMAL HIGH (ref 70–99)
Glucose-Capillary: 126 mg/dL — ABNORMAL HIGH (ref 70–99)
Glucose-Capillary: 132 mg/dL — ABNORMAL HIGH (ref 70–99)
Glucose-Capillary: 129 mg/dL — ABNORMAL HIGH (ref 70–99)
Glucose-Capillary: 120 mg/dL — ABNORMAL HIGH (ref 70–99)
Glucose-Capillary: 116 mg/dL — ABNORMAL HIGH (ref 70–99)
Glucose-Capillary: 129 mg/dL — ABNORMAL HIGH (ref 70–99)

## 2021-05-22 LAB — MAGNESIUM: Magnesium: 3.1 mg/dL — ABNORMAL HIGH (ref 1.7–2.4)

## 2021-05-22 LAB — APTT: aPTT: 35 seconds (ref 24–36)

## 2021-05-22 LAB — CBC
HCT: 36.6 % (ref 36.0–46.0)
HCT: 35.9 % — ABNORMAL LOW (ref 36.0–46.0)
Hemoglobin: 12.3 g/dL (ref 12.0–15.0)
Hemoglobin: 11.8 g/dL — ABNORMAL LOW (ref 12.0–15.0)
MCH: 33.3 pg (ref 26.0–34.0)
MCH: 32.7 pg (ref 26.0–34.0)
MCHC: 33.6 g/dL (ref 30.0–36.0)
MCHC: 32.9 g/dL (ref 30.0–36.0)
MCV: 99.2 fL (ref 80.0–100.0)
MCV: 99.4 fL (ref 80.0–100.0)
Platelets: 108 10*3/uL — ABNORMAL LOW (ref 150–400)
Platelets: 94 10*3/uL — ABNORMAL LOW (ref 150–400)
RBC: 3.69 MIL/uL — ABNORMAL LOW (ref 3.87–5.11)
RBC: 3.61 MIL/uL — ABNORMAL LOW (ref 3.87–5.11)
RDW: 13.2 % (ref 11.5–15.5)
RDW: 13.2 % (ref 11.5–15.5)
WBC: 13.1 10*3/uL — ABNORMAL HIGH (ref 4.0–10.5)
WBC: 9.6 10*3/uL (ref 4.0–10.5)
nRBC: 0 % (ref 0.0–0.2)
nRBC: 0 % (ref 0.0–0.2)

## 2021-05-22 LAB — PROTIME-INR
INR: 1.4 — ABNORMAL HIGH (ref 0.8–1.2)
Prothrombin Time: 17.4 seconds — ABNORMAL HIGH (ref 11.4–15.2)

## 2021-05-22 LAB — HEMOGLOBIN AND HEMATOCRIT, BLOOD
HCT: 28.6 % — ABNORMAL LOW (ref 36.0–46.0)
Hemoglobin: 9.6 g/dL — ABNORMAL LOW (ref 12.0–15.0)

## 2021-05-22 LAB — PLATELET COUNT: Platelets: 112 10*3/uL — ABNORMAL LOW (ref 150–400)

## 2021-05-22 LAB — ABO/RH: ABO/RH(D): A NEG

## 2021-05-22 SURGERY — REPLACEMENT, AORTIC VALVE, OPEN
Anesthesia: General | Site: Chest

## 2021-05-22 MED ORDER — SODIUM CHLORIDE 0.9% FLUSH: 3.0000 mL | INTRAVENOUS | Status: DC | PRN

## 2021-05-22 MED ORDER — MIDAZOLAM HCL (PF) 10 MG/2ML IJ SOLN
INTRAMUSCULAR | Status: AC
  Filled 2021-05-22: qty 2

## 2021-05-22 MED ORDER — PANTOPRAZOLE SODIUM 40 MG PO TBEC
40.0000 mg | DELAYED_RELEASE_TABLET | Freq: Every day | ORAL | Status: DC
  Administered 2021-05-24 – 2021-05-26 (×3): 40 mg via ORAL
  Filled 2021-05-22 (×3): qty 1

## 2021-05-22 MED ORDER — METOPROLOL TARTRATE 25 MG/10 ML ORAL SUSPENSION: 12.5000 mg | Freq: Two times a day (BID) | ORAL | Status: DC

## 2021-05-22 MED ORDER — MIDAZOLAM HCL 2 MG/2ML IJ SOLN
INTRAMUSCULAR | Status: DC | PRN
  Administered 2021-05-22: 3 mg via INTRAVENOUS
  Administered 2021-05-22 (×2): 2 mg via INTRAVENOUS

## 2021-05-22 MED ORDER — NITROGLYCERIN IN D5W 200-5 MCG/ML-% IV SOLN: 0.0000 ug/min | INTRAVENOUS | Status: DC

## 2021-05-22 MED ORDER — SODIUM CHLORIDE 0.45 % IV SOLN: INTRAVENOUS | Status: DC | PRN

## 2021-05-22 MED ORDER — DEXMEDETOMIDINE HCL IN NACL 400 MCG/100ML IV SOLN: 0.0000 ug/kg/h | INTRAVENOUS | Status: DC

## 2021-05-22 MED ORDER — ORAL CARE MOUTH RINSE: 15.0000 mL | Freq: Once | OROMUCOSAL | Status: AC

## 2021-05-22 MED ORDER — ROCURONIUM BROMIDE 10 MG/ML (PF) SYRINGE
PREFILLED_SYRINGE | INTRAVENOUS | Status: AC
  Filled 2021-05-22: qty 10

## 2021-05-22 MED ORDER — ACETAMINOPHEN 650 MG RE SUPP
650.0000 mg | Freq: Once | RECTAL | Status: AC
  Administered 2021-05-22: 650 mg via RECTAL

## 2021-05-22 MED ORDER — CHLORHEXIDINE GLUCONATE 4 % EX LIQD: 30.0000 mL | CUTANEOUS | Status: DC

## 2021-05-22 MED ORDER — LACTATED RINGERS IV SOLN: INTRAVENOUS | Status: DC

## 2021-05-22 MED ORDER — ALBUTEROL SULFATE HFA 108 (90 BASE) MCG/ACT IN AERS
INHALATION_SPRAY | RESPIRATORY_TRACT | Status: DC | PRN
  Administered 2021-05-22: 10 via RESPIRATORY_TRACT

## 2021-05-22 MED ORDER — PHENYLEPHRINE 40 MCG/ML (10ML) SYRINGE FOR IV PUSH (FOR BLOOD PRESSURE SUPPORT)
PREFILLED_SYRINGE | INTRAVENOUS | Status: DC | PRN
  Administered 2021-05-22: 80 ug via INTRAVENOUS
  Administered 2021-05-22 (×3): 40 ug via INTRAVENOUS

## 2021-05-22 MED ORDER — MAGNESIUM SULFATE 4 GM/100ML IV SOLN
4.0000 g | Freq: Once | INTRAVENOUS | Status: AC
  Administered 2021-05-22: 4 g via INTRAVENOUS
  Filled 2021-05-22: qty 100

## 2021-05-22 MED ORDER — MIDAZOLAM HCL 2 MG/2ML IJ SOLN: 2.0000 mg | INTRAMUSCULAR | Status: DC | PRN

## 2021-05-22 MED ORDER — BISACODYL 10 MG RE SUPP: 10.0000 mg | Freq: Every day | RECTAL | Status: DC

## 2021-05-22 MED ORDER — SODIUM CHLORIDE (PF) 0.9 % IJ SOLN
OROMUCOSAL | Status: DC | PRN
  Administered 2021-05-22 (×3): 4 mL via TOPICAL

## 2021-05-22 MED ORDER — LACTATED RINGERS IV SOLN: 500.0000 mL | Freq: Once | INTRAVENOUS | Status: DC | PRN

## 2021-05-22 MED ORDER — DOPAMINE-DEXTROSE 3.2-5 MG/ML-% IV SOLN
3.0000 ug/kg/min | INTRAVENOUS | Status: DC
  Administered 2021-05-22: 3 ug/kg/min via INTRAVENOUS
  Filled 2021-05-22: qty 250

## 2021-05-22 MED ORDER — ASPIRIN 81 MG PO CHEW: 324.0000 mg | CHEWABLE_TABLET | Freq: Every day | ORAL | Status: DC

## 2021-05-22 MED ORDER — BISACODYL 5 MG PO TBEC
10.0000 mg | DELAYED_RELEASE_TABLET | Freq: Every day | ORAL | Status: DC
  Administered 2021-05-23 – 2021-05-26 (×4): 10 mg via ORAL
  Filled 2021-05-22 (×4): qty 2

## 2021-05-22 MED ORDER — FENTANYL CITRATE (PF) 250 MCG/5ML IJ SOLN
INTRAMUSCULAR | Status: AC
  Filled 2021-05-22: qty 25

## 2021-05-22 MED ORDER — CEFAZOLIN SODIUM-DEXTROSE 2-4 GM/100ML-% IV SOLN
2.0000 g | Freq: Three times a day (TID) | INTRAVENOUS | Status: AC
  Administered 2021-05-22 – 2021-05-24 (×6): 2 g via INTRAVENOUS
  Filled 2021-05-22 (×6): qty 100

## 2021-05-22 MED ORDER — CHLORHEXIDINE GLUCONATE 0.12 % MT SOLN
15.0000 mL | OROMUCOSAL | Status: AC
  Administered 2021-05-22: 15 mL via OROMUCOSAL

## 2021-05-22 MED ORDER — MOMETASONE FURO-FORMOTEROL FUM 100-5 MCG/ACT IN AERO
2.0000 | INHALATION_SPRAY | Freq: Two times a day (BID) | RESPIRATORY_TRACT | Status: DC
  Administered 2021-05-22 – 2021-05-26 (×8): 2 via RESPIRATORY_TRACT
  Filled 2021-05-22 (×2): qty 8.8

## 2021-05-22 MED ORDER — METOPROLOL TARTRATE 12.5 MG HALF TABLET
12.5000 mg | ORAL_TABLET | Freq: Two times a day (BID) | ORAL | Status: DC
  Administered 2021-05-23 – 2021-05-26 (×4): 12.5 mg via ORAL
  Filled 2021-05-22 (×5): qty 1

## 2021-05-22 MED ORDER — POTASSIUM CHLORIDE 10 MEQ/50ML IV SOLN: 10.0000 meq | INTRAVENOUS | Status: AC

## 2021-05-22 MED ORDER — HEPARIN SODIUM (PORCINE) 1000 UNIT/ML IJ SOLN
INTRAMUSCULAR | Status: DC | PRN
Start: 2021-05-22 — End: 2021-05-22
  Administered 2021-05-22: 25000 [IU] via INTRAVENOUS

## 2021-05-22 MED ORDER — ASPIRIN EC 325 MG PO TBEC
325.0000 mg | DELAYED_RELEASE_TABLET | Freq: Every day | ORAL | Status: DC
  Administered 2021-05-23 – 2021-05-26 (×4): 325 mg via ORAL
  Filled 2021-05-22 (×4): qty 1

## 2021-05-22 MED ORDER — ARTIFICIAL TEARS OPHTHALMIC OINT
TOPICAL_OINTMENT | OPHTHALMIC | Status: DC | PRN
  Administered 2021-05-22: 1 via OPHTHALMIC

## 2021-05-22 MED ORDER — ALBUMIN HUMAN 5 % IV SOLN
250.0000 mL | INTRAVENOUS | Status: DC | PRN
Start: 2021-05-22 — End: 2021-05-22

## 2021-05-22 MED ORDER — ROCURONIUM BROMIDE 10 MG/ML (PF) SYRINGE
PREFILLED_SYRINGE | INTRAVENOUS | Status: DC | PRN
Start: 2021-05-22 — End: 2021-05-22
  Administered 2021-05-22: 60 mg via INTRAVENOUS
  Administered 2021-05-22: 40 mg via INTRAVENOUS
  Administered 2021-05-22: 50 mg via INTRAVENOUS

## 2021-05-22 MED ORDER — HEPARIN SODIUM (PORCINE) 1000 UNIT/ML IJ SOLN
INTRAMUSCULAR | Status: AC
  Filled 2021-05-22: qty 1

## 2021-05-22 MED ORDER — ORAL CARE MOUTH RINSE
15.0000 mL | OROMUCOSAL | Status: DC
  Administered 2021-05-22: 15 mL via OROMUCOSAL

## 2021-05-22 MED ORDER — SODIUM CHLORIDE 0.9% FLUSH
3.0000 mL | Freq: Two times a day (BID) | INTRAVENOUS | Status: DC
  Administered 2021-05-23: 3 mL via INTRAVENOUS

## 2021-05-22 MED ORDER — ACETAMINOPHEN 160 MG/5ML PO SOLN: 1000.0000 mg | Freq: Four times a day (QID) | ORAL | Status: DC

## 2021-05-22 MED ORDER — 0.9 % SODIUM CHLORIDE (POUR BTL) OPTIME
TOPICAL | Status: DC | PRN
  Administered 2021-05-22: 5000 mL

## 2021-05-22 MED ORDER — ONDANSETRON HCL 4 MG/2ML IJ SOLN
4.0000 mg | Freq: Four times a day (QID) | INTRAMUSCULAR | Status: DC | PRN
Start: 2021-05-22 — End: 2021-05-26
  Administered 2021-05-22 – 2021-05-23 (×3): 4 mg via INTRAVENOUS
  Filled 2021-05-22 (×3): qty 2

## 2021-05-22 MED ORDER — VANCOMYCIN HCL IN DEXTROSE 1-5 GM/200ML-% IV SOLN
1000.0000 mg | Freq: Once | INTRAVENOUS | Status: AC
  Administered 2021-05-22: 1000 mg via INTRAVENOUS
  Filled 2021-05-22: qty 200

## 2021-05-22 MED ORDER — SODIUM CHLORIDE 0.9 % IV SOLN: INTRAVENOUS | Status: DC | PRN

## 2021-05-22 MED ORDER — LEVALBUTEROL HCL 0.63 MG/3ML IN NEBU: 0.6300 mg | INHALATION_SOLUTION | Freq: Four times a day (QID) | RESPIRATORY_TRACT | Status: DC | PRN

## 2021-05-22 MED ORDER — PROPOFOL 10 MG/ML IV BOLUS
INTRAVENOUS | Status: DC | PRN
  Administered 2021-05-22: 50 mg via INTRAVENOUS
  Administered 2021-05-22: 70 mg via INTRAVENOUS

## 2021-05-22 MED ORDER — LACTATED RINGERS IV SOLN: INTRAVENOUS | Status: DC | PRN

## 2021-05-22 MED ORDER — ~~LOC~~ CARDIAC SURGERY, PATIENT & FAMILY EDUCATION
Freq: Once | Status: DC
  Filled 2021-05-22: qty 1

## 2021-05-22 MED ORDER — PHENYLEPHRINE HCL-NACL 20-0.9 MG/250ML-% IV SOLN
0.0000 ug/min | INTRAVENOUS | Status: DC
  Administered 2021-05-22: 40 ug/min via INTRAVENOUS
  Filled 2021-05-22 (×2): qty 250

## 2021-05-22 MED ORDER — INSULIN REGULAR(HUMAN) IN NACL 100-0.9 UT/100ML-% IV SOLN: INTRAVENOUS | Status: DC

## 2021-05-22 MED ORDER — CHLORHEXIDINE GLUCONATE 0.12 % MT SOLN
15.0000 mL | Freq: Once | OROMUCOSAL | Status: AC
  Administered 2021-05-22: 15 mL via OROMUCOSAL
  Filled 2021-05-22: qty 15

## 2021-05-22 MED ORDER — ALBUMIN HUMAN 25 % IV SOLN
12.5000 g | INTRAVENOUS | Status: AC | PRN
Start: 2021-05-22 — End: 2021-05-24
  Administered 2021-05-22 – 2021-05-23 (×4): 12.5 g via INTRAVENOUS
  Filled 2021-05-22 (×2): qty 50

## 2021-05-22 MED ORDER — DOCUSATE SODIUM 100 MG PO CAPS
200.0000 mg | ORAL_CAPSULE | Freq: Every day | ORAL | Status: DC
  Administered 2021-05-23 – 2021-05-26 (×4): 200 mg via ORAL
  Filled 2021-05-22 (×4): qty 2

## 2021-05-22 MED ORDER — FENTANYL CITRATE (PF) 250 MCG/5ML IJ SOLN
INTRAMUSCULAR | Status: DC | PRN
  Administered 2021-05-22: 100 ug via INTRAVENOUS
  Administered 2021-05-22: 150 ug via INTRAVENOUS
  Administered 2021-05-22: 100 ug via INTRAVENOUS
  Administered 2021-05-22: 150 ug via INTRAVENOUS
  Administered 2021-05-22: 100 ug via INTRAVENOUS
  Administered 2021-05-22: 300 ug via INTRAVENOUS
  Administered 2021-05-22: 100 ug via INTRAVENOUS

## 2021-05-22 MED ORDER — CHLORHEXIDINE GLUCONATE CLOTH 2 % EX PADS
6.0000 | MEDICATED_PAD | Freq: Every day | CUTANEOUS | Status: DC
  Administered 2021-05-22 – 2021-05-26 (×5): 6 via TOPICAL

## 2021-05-22 MED ORDER — ACETAMINOPHEN 500 MG PO TABS
1000.0000 mg | ORAL_TABLET | Freq: Four times a day (QID) | ORAL | Status: DC
  Administered 2021-05-23 – 2021-05-26 (×12): 1000 mg via ORAL
  Filled 2021-05-22 (×13): qty 2

## 2021-05-22 MED ORDER — OXYCODONE HCL 5 MG PO TABS
5.0000 mg | ORAL_TABLET | ORAL | Status: DC | PRN
  Administered 2021-05-24: 5 mg via ORAL
  Administered 2021-05-24 – 2021-05-25 (×2): 10 mg via ORAL
  Administered 2021-05-25 – 2021-05-26 (×3): 5 mg via ORAL
  Administered 2021-05-26: 10 mg via ORAL
  Filled 2021-05-22: qty 1
  Filled 2021-05-22: qty 2
  Filled 2021-05-22: qty 1
  Filled 2021-05-22 (×2): qty 2
  Filled 2021-05-22 (×2): qty 1

## 2021-05-22 MED ORDER — PROTAMINE SULFATE 10 MG/ML IV SOLN
INTRAVENOUS | Status: DC | PRN
Start: 2021-05-22 — End: 2021-05-22
  Administered 2021-05-22: 250 mg via INTRAVENOUS

## 2021-05-22 MED ORDER — CHLORHEXIDINE GLUCONATE 0.12 % MT SOLN: 15.0000 mL | Freq: Once | OROMUCOSAL | Status: AC

## 2021-05-22 MED ORDER — METOPROLOL TARTRATE 5 MG/5ML IV SOLN: 2.5000 mg | INTRAVENOUS | Status: DC | PRN

## 2021-05-22 MED ORDER — MORPHINE SULFATE (PF) 2 MG/ML IV SOLN
1.0000 mg | INTRAVENOUS | Status: DC | PRN
  Administered 2021-05-23 (×2): 2 mg via INTRAVENOUS
  Filled 2021-05-22 (×2): qty 1

## 2021-05-22 MED ORDER — DEXTROSE 50 % IV SOLN: 0.0000 mL | INTRAVENOUS | Status: DC | PRN

## 2021-05-22 MED ORDER — PROPOFOL 10 MG/ML IV BOLUS
INTRAVENOUS | Status: AC
  Filled 2021-05-22: qty 40

## 2021-05-22 MED ORDER — KETOROLAC TROMETHAMINE 30 MG/ML IJ SOLN
30.0000 mg | Freq: Four times a day (QID) | INTRAMUSCULAR | Status: AC | PRN
Start: 2021-05-22 — End: 2021-05-23
  Administered 2021-05-23 (×2): 30 mg via INTRAVENOUS
  Filled 2021-05-22 (×2): qty 1

## 2021-05-22 MED ORDER — SODIUM CHLORIDE 0.9 % IV SOLN: INTRAVENOUS | Status: DC

## 2021-05-22 MED ORDER — FAMOTIDINE IN NACL 20-0.9 MG/50ML-% IV SOLN
20.0000 mg | Freq: Two times a day (BID) | INTRAVENOUS | Status: AC
  Administered 2021-05-22: 20 mg via INTRAVENOUS
  Filled 2021-05-22: qty 50

## 2021-05-22 MED ORDER — SODIUM CHLORIDE (PF) 0.9 % IJ SOLN
INTRAMUSCULAR | Status: AC
  Filled 2021-05-22: qty 20

## 2021-05-22 MED ORDER — KETOROLAC TROMETHAMINE 30 MG/ML IJ SOLN
30.0000 mg | Freq: Once | INTRAMUSCULAR | Status: AC
  Administered 2021-05-22: 30 mg via INTRAVENOUS
  Filled 2021-05-22: qty 1

## 2021-05-22 MED ORDER — SODIUM CHLORIDE 0.9 % IV BOLUS
250.0000 mL | INTRAVENOUS | Status: DC | PRN
  Administered 2021-05-22 – 2021-05-23 (×2): 250 mL via INTRAVENOUS

## 2021-05-22 MED ORDER — METOPROLOL TARTRATE 12.5 MG HALF TABLET
12.5000 mg | ORAL_TABLET | Freq: Once | ORAL | Status: DC
  Filled 2021-05-22: qty 1

## 2021-05-22 MED ORDER — ACETAMINOPHEN 160 MG/5ML PO SOLN: 650.0000 mg | Freq: Once | ORAL | Status: AC

## 2021-05-22 MED ORDER — HEMOSTATIC AGENTS (NO CHARGE) OPTIME
TOPICAL | Status: DC | PRN
  Administered 2021-05-22: 1 via TOPICAL

## 2021-05-22 MED ORDER — TRAMADOL HCL 50 MG PO TABS
50.0000 mg | ORAL_TABLET | ORAL | Status: DC | PRN
  Administered 2021-05-23 (×2): 50 mg via ORAL
  Filled 2021-05-22 (×2): qty 1

## 2021-05-22 MED ORDER — ALBUMIN HUMAN 5 % IV SOLN: INTRAVENOUS | Status: DC | PRN

## 2021-05-22 MED ORDER — CHLORHEXIDINE GLUCONATE 0.12% ORAL RINSE (MEDLINE KIT): 15.0000 mL | Freq: Two times a day (BID) | OROMUCOSAL | Status: DC

## 2021-05-22 MED ORDER — SODIUM CHLORIDE 0.9 % IV SOLN: 250.0000 mL | INTRAVENOUS | Status: DC

## 2021-05-22 MED ORDER — CHLORHEXIDINE GLUCONATE 0.12 % MT SOLN
15.0000 mL | Freq: Two times a day (BID) | OROMUCOSAL | Status: DC
  Administered 2021-05-22 – 2021-05-23 (×2): 15 mL via OROMUCOSAL
  Filled 2021-05-22: qty 15

## 2021-05-22 MED ORDER — ORAL CARE MOUTH RINSE
15.0000 mL | Freq: Two times a day (BID) | OROMUCOSAL | Status: DC
  Administered 2021-05-24 – 2021-05-26 (×5): 15 mL via OROMUCOSAL

## 2021-05-22 MED FILL — Heparin Sodium (Porcine) Inj 1000 Unit/ML: Qty: 1000 | Status: AC

## 2021-05-22 MED FILL — Potassium Chloride Inj 2 mEq/ML: INTRAVENOUS | Qty: 40 | Status: AC

## 2021-05-22 MED FILL — Magnesium Sulfate Inj 50%: INTRAMUSCULAR | Qty: 10 | Status: AC

## 2021-05-22 MED FILL — Heparin Sodium (Porcine) Inj 1000 Unit/ML: INTRAMUSCULAR | Qty: 5000 | Status: AC

## 2021-05-22 SURGICAL SUPPLY — 103 items
ADAPTER CARDIO PERF ANTE/RETRO (ADAPTER) ×3 IMPLANT
ADH SRG 12 PREFL SYR 3 SPRDR (MISCELLANEOUS)
ADPR PRFSN 84XANTGRD RTRGD (ADAPTER) ×2
APL SWBSTK 6 STRL LF DISP (MISCELLANEOUS)
APPLICATOR COTTON TIP 6 STRL (MISCELLANEOUS) IMPLANT
APPLICATOR COTTON TIP 6IN STRL (MISCELLANEOUS)
BAG DECANTER FOR FLEXI CONT (MISCELLANEOUS) ×2 IMPLANT
BATTERY MAXDRIVER (MISCELLANEOUS) ×2 IMPLANT
BLADE CLIPPER SURG (BLADE) ×2 IMPLANT
BLADE STERNUM SYSTEM 6 (BLADE) ×3 IMPLANT
BLADE SURG 15 STRL LF DISP TIS (BLADE) ×2 IMPLANT
BLADE SURG 15 STRL SS (BLADE) ×3
CANISTER SUCT 3000ML PPV (MISCELLANEOUS) ×3 IMPLANT
CANNULA AORTIC ROOT 9FR (CANNULA) ×1 IMPLANT
CANNULA EZ GLIDE AORTIC 21FR (CANNULA) ×3 IMPLANT
CANNULA GUNDRY RCSP 15FR (MISCELLANEOUS) ×3 IMPLANT
CANNULA MC2 2 STG 36/46 NON-V (CANNULA) IMPLANT
CANNULA VENOUS 2 STG 34/46 (CANNULA) ×3
CATH CPB KIT HENDRICKSON (MISCELLANEOUS) ×2 IMPLANT
CATH HEART VENT LEFT (CATHETERS) ×2 IMPLANT
CATH ROBINSON RED A/P 18FR (CATHETERS) ×6 IMPLANT
CATH THORACIC 36FR (CATHETERS) ×1 IMPLANT
CATH THORACIC 36FR RT ANG (CATHETERS) ×5 IMPLANT
CLIP FOGARTY SPRING 6M (CLIP) IMPLANT
CONT SPEC 4OZ CLIKSEAL STRL BL (MISCELLANEOUS) ×1 IMPLANT
CONTAINER PROTECT SURGISLUSH (MISCELLANEOUS) ×4 IMPLANT
DEVICE SUT CK QUICK LOAD MINI (Prosthesis & Implant Heart) ×1 IMPLANT
DRAPE WARM FLUID 44X44 (DRAPES) ×3 IMPLANT
DRSG AQUACEL AG ADV 3.5X14 (GAUZE/BANDAGES/DRESSINGS) ×1 IMPLANT
DRSG COVADERM 4X14 (GAUZE/BANDAGES/DRESSINGS) ×2 IMPLANT
DRSG MEPILEX SACRM 8.7X9.8 (GAUZE/BANDAGES/DRESSINGS) ×1 IMPLANT
ELECT REM PT RETURN 9FT ADLT (ELECTROSURGICAL) ×6
ELECTRODE REM PT RTRN 9FT ADLT (ELECTROSURGICAL) ×4 IMPLANT
FELT TEFLON 1X6 (MISCELLANEOUS) ×5 IMPLANT
GAUZE SPONGE 4X4 12PLY STRL (GAUZE/BANDAGES/DRESSINGS) ×6 IMPLANT
GAUZE SPONGE 4X4 12PLY STRL LF (GAUZE/BANDAGES/DRESSINGS) ×1 IMPLANT
GLOVE SURG MICRO LTX SZ6 (GLOVE) ×4 IMPLANT
GLOVE SURG MICRO LTX SZ6.5 (GLOVE) ×2 IMPLANT
GLOVE SURG MICRO LTX SZ7 (GLOVE) ×2 IMPLANT
GLOVE SURG SIGNA 7.5 PF LTX (GLOVE) ×9 IMPLANT
GLOVE SURG UNDER POLY LF SZ6 (GLOVE) ×1 IMPLANT
GOWN STRL REUS W/ TWL LRG LVL3 (GOWN DISPOSABLE) ×8 IMPLANT
GOWN STRL REUS W/ TWL XL LVL3 (GOWN DISPOSABLE) ×2 IMPLANT
GOWN STRL REUS W/TWL LRG LVL3 (GOWN DISPOSABLE) ×18
GOWN STRL REUS W/TWL XL LVL3 (GOWN DISPOSABLE) ×6
HEMOSTAT POWDER SURGIFOAM 1G (HEMOSTASIS) ×9 IMPLANT
HEMOSTAT SURGICEL 2X14 (HEMOSTASIS) ×3 IMPLANT
INSERT FOGARTY XLG (MISCELLANEOUS) IMPLANT
KIT BASIN OR (CUSTOM PROCEDURE TRAY) ×3 IMPLANT
KIT SUCTION CATH 14FR (SUCTIONS) ×6 IMPLANT
KIT SUT CK MINI COMBO 4X17 (Prosthesis & Implant Heart) ×1 IMPLANT
KIT TURNOVER KIT B (KITS) ×3 IMPLANT
LINE VENT (MISCELLANEOUS) ×1 IMPLANT
NDL SUT 1 .5 CRC FRENCH EYE (NEEDLE) IMPLANT
NEEDLE FRENCH EYE (NEEDLE) ×3
NS IRRIG 1000ML POUR BTL (IV SOLUTION) ×15 IMPLANT
PACK E OPEN HEART (SUTURE) ×3 IMPLANT
PACK OPEN HEART (CUSTOM PROCEDURE TRAY) ×3 IMPLANT
PAD ARMBOARD 7.5X6 YLW CONV (MISCELLANEOUS) ×4 IMPLANT
PENCIL BUTTON HOLSTER BLD 10FT (ELECTRODE) ×1 IMPLANT
PLATE STERNAL 2.3X208 14H 2-PK (Plate) ×1 IMPLANT
POSITIONER HEAD DONUT 9IN (MISCELLANEOUS) ×3 IMPLANT
SCREW LOCKING TI 2.3X11MM (Screw) ×8 IMPLANT
SCREW LOCKING TI 2.3X13MM (Screw) ×8 IMPLANT
SCREW STERNAL LOCK 2.3MM (Screw) ×2 IMPLANT
SET MPS 3-ND DEL (MISCELLANEOUS) ×1 IMPLANT
SPONGE T-LAP 18X18 ~~LOC~~+RFID (SPONGE) ×4 IMPLANT
SPONGE T-LAP 4X18 ~~LOC~~+RFID (SPONGE) ×1 IMPLANT
SUT BONE WAX W31G (SUTURE) ×3 IMPLANT
SUT EB EXC GRN/WHT 2-0 V-5 (SUTURE) ×6 IMPLANT
SUT ETHIBON EXCEL 2-0 V-5 (SUTURE) IMPLANT
SUT ETHIBOND 2 0 SH (SUTURE) ×3
SUT ETHIBOND 2 0 SH 36X2 (SUTURE) ×2 IMPLANT
SUT ETHIBOND 2 0 V4 (SUTURE) IMPLANT
SUT ETHIBOND 2 0 V5 (SUTURE) ×2 IMPLANT
SUT ETHIBOND 2 0V4 GREEN (SUTURE) IMPLANT
SUT ETHIBOND 4 0 RB 1 (SUTURE) IMPLANT
SUT ETHIBOND V-5 VALVE (SUTURE) IMPLANT
SUT PROLENE 3 0 SH 1 (SUTURE) IMPLANT
SUT PROLENE 3 0 SH DA (SUTURE) ×3 IMPLANT
SUT PROLENE 4 0 RB 1 (SUTURE) ×12
SUT PROLENE 4-0 RB1 .5 CRCL 36 (SUTURE) ×4 IMPLANT
SUT SILK  1 MH (SUTURE) ×3
SUT SILK 1 MH (SUTURE) ×2 IMPLANT
SUT STEEL 6MS V (SUTURE) ×1 IMPLANT
SUT STEEL SZ 6 DBL 3X14 BALL (SUTURE) ×1 IMPLANT
SUT VIC AB 1 CTX 36 (SUTURE) ×6
SUT VIC AB 1 CTX36XBRD ANBCTR (SUTURE) ×4 IMPLANT
SUT VIC AB 2-0 CTX 27 (SUTURE) IMPLANT
SUT VIC AB 3-0 X1 27 (SUTURE) IMPLANT
SYR 10ML KIT SKIN ADHESIVE (MISCELLANEOUS) IMPLANT
SYSTEM SAHARA CHEST DRAIN ATS (WOUND CARE) ×3 IMPLANT
TAPE CLOTH SURG 4X10 WHT LF (GAUZE/BANDAGES/DRESSINGS) ×1 IMPLANT
TAPE PAPER 2X10 WHT MICROPORE (GAUZE/BANDAGES/DRESSINGS) ×1 IMPLANT
TOWEL GREEN STERILE (TOWEL DISPOSABLE) ×3 IMPLANT
TOWEL GREEN STERILE FF (TOWEL DISPOSABLE) ×3 IMPLANT
TRAY FOLEY SLVR 14FR TEMP STAT (SET/KITS/TRAYS/PACK) ×1 IMPLANT
TRAY FOLEY SLVR 16FR TEMP STAT (SET/KITS/TRAYS/PACK) ×2 IMPLANT
TUBE SUCT INTRACARD DLP 20F (MISCELLANEOUS) ×1 IMPLANT
UNDERPAD 30X36 HEAVY ABSORB (UNDERPADS AND DIAPERS) ×3 IMPLANT
VALVE AORTIC SZ23 INSP/RESIL (Prosthesis & Implant Heart) ×1 IMPLANT
VENT LEFT HEART 12002 (CATHETERS) ×3
WATER STERILE IRR 1000ML POUR (IV SOLUTION) ×6 IMPLANT

## 2021-05-22 NOTE — Anesthesia Procedure Notes (Signed)
Procedure Name: Intubation Date/Time: 05/22/2021 8:48 AM Performed by: Justin Mend, RN Pre-anesthesia Checklist: Patient identified, Emergency Drugs available, Suction available and Patient being monitored Patient Re-evaluated:Patient Re-evaluated prior to induction Oxygen Delivery Method: Circle System Utilized Preoxygenation: Pre-oxygenation with 100% oxygen Induction Type: IV induction Ventilation: Mask ventilation without difficulty Laryngoscope Size: Mac and 4 Grade View: Grade II Tube type: Oral Tube size: 8.0 mm Number of attempts: 1 Airway Equipment and Method: Stylet and Oral airway Placement Confirmation: ETT inserted through vocal cords under direct vision, positive ETCO2 and breath sounds checked- equal and bilateral Secured at: 23 cm Tube secured with: Tape Dental Injury: Teeth and Oropharynx as per pre-operative assessment

## 2021-05-22 NOTE — Transfer of Care (Signed)
Immediate Anesthesia Transfer of Care Note  Patient: Crystal Silva  Procedure(s) Performed: AORTIC VALVE REPLACEMENT (AVR) WITH STERNAL PLATING. 23 MM INSPIRIS RESILIA  AORTIC VALVE. (Chest) TRANSESOPHAGEAL ECHOCARDIOGRAM (TEE) APPLICATION OF CELL SAVER  Patient Location: ICU  Anesthesia Type:General  Level of Consciousness: sedated and Patient remains intubated per anesthesia plan  Airway & Oxygen Therapy: Patient remains intubated per anesthesia plan and Patient placed on Ventilator (see vital sign flow sheet for setting)  Post-op Assessment: Report given to RN and Post -op Vital signs reviewed and stable  Post vital signs: Reviewed and stable  Last Vitals:  Vitals Value Taken Time  BP 125/75   Temp 35.3 C 05/22/21 1259  Pulse 80 05/22/21 1259  Resp 12 05/22/21 1259  SpO2 100 % 05/22/21 1259  Vitals shown include unvalidated device data.  Last Pain:  Vitals:   05/22/21 0657  TempSrc:   PainSc: 0-No pain         Complications: No notable events documented.

## 2021-05-22 NOTE — Hospital Course (Addendum)
History of Present Illness:     Crystal Silva is a 60 year old female 6 pack-year tobacco smoker with no significant past medical history who presented to the emergency room on 04/28/2021 with dyspnea on exertion and sensation of her heart racing.  She described having similar symptoms over the past year but these have been more frequent and intense over the past 6 weeks. Work up in the emergency room included an EKG that showed normal sinus rhythm with no ischemic changes.  Serial troponins were 3 and 3.  BNP was 37.  CT angiogram of the chest demonstrated significant aortic valve calcification, no coronary calcification.  There was a 7 mm right upper lobe nodule without lymphadenopathy for which repeat CT is recommended in 6 to 12 months was recommended.  She was also noted to have evidence of emphysema on CT scan.  Physical exam was notable for a prominent systolic murmur.  She was admitted to the hospital for further work-up.  An echocardiogram was obtained on 04/29/2021 confirmed severe aortic stenosis.  Left ventricular ejection fraction was estimated at 70 to 75%.  RV function was normal.  There was slight elevation in the EDP and PA systolic pressures.  The mean aortic valve gradient was measured at 40.2 mmHg with a peak gradient of 64 mmHg.  Calculated aortic valve area was 0.84 cm. Left heart catheterization was performed on 04/30/21 which demonstrated significant no obstructive coronary artery disease.  The patient was felt to be symptomatic from both chronic obstructive pulmonary disease and the aortic stenosis.  She has been treated with diuresis, oral steroids, and inhaled  bronchodilators and  has improved symptomatically.  CT surgery referral was made for consideration of surgical management of her severe aortic stenosis.  After evaluation by Dr. Dorris Fetch, aortic valve replacement was recommended and she decided to proceed.   Hospital Course: Mr. Was admitted for elective surgery on  05/22/2021 taken to the operating.  Aortic valve replacement was accomplished using a 23 mm Edwards bioprosthetic valve.  Following the procedure, she separated from cardiopulmonary bypass without any difficulty.  She was transferred to the cardiovascular ICU in stable condition.  Postoperative hospital course:  Following the procedure the patient has maintained intact normal status.  She initially did require atrial pacing for a short time and subsequently did develop a episode of atrial fibrillation.  She was chemically cardioverted to sinus rhythm with amiodarone and has been stable in this regard for over 2 days maintaining sinus rhythm.  She will be on oral aspirin as this is a tissue valve.  On postop day 1 it was noted that she had a moderate size right-sided pneumothorax.  This did show some improvement on postoperative day #2 and she was monitored clinically.  She was managed with aggressive pulmonary hygiene as well as bronchodilators due to her known significant COPD.  She did have some postoperative expected volume overload and urine output has been good.  She was noted to have a postoperative thrombocytopenia which was monitored over time.  Due to this she was not giving any heparin products.  She is noted to have a expected acute blood loss anemia and values are stable.  She was started on a course of routine postoperative cardiac rehabilitation protocols.Oxygen has been weaned and she is maintaining good saturations on room air.  Incision is noted to be healing well without evidence of infection.  She is tolerating diet.  Overall at the time of discharge the patient is felt to be  quite stable.

## 2021-05-22 NOTE — Anesthesia Preprocedure Evaluation (Signed)
Anesthesia Evaluation  Patient identified by MRN, date of birth, ID band Patient awake    Reviewed: Allergy & Precautions, H&P , NPO status , Patient's Chart, lab work & pertinent test results  Airway Mallampati: II   Neck ROM: full    Dental   Pulmonary shortness of breath, COPD, Current Smoker,    breath sounds clear to auscultation       Cardiovascular + DOE  + Valvular Problems/Murmurs AS  Rhythm:regular Rate:Normal  TTE (04/29/21): EF 70%, severe AS   Neuro/Psych  Headaches,    GI/Hepatic   Endo/Other    Renal/GU      Musculoskeletal   Abdominal   Peds  Hematology   Anesthesia Other Findings   Reproductive/Obstetrics                             Anesthesia Physical Anesthesia Plan  ASA: 3  Anesthesia Plan: General   Post-op Pain Management:    Induction: Intravenous  PONV Risk Score and Plan: 2 and Ondansetron, Dexamethasone, Midazolam and Treatment may vary due to age or medical condition  Airway Management Planned: Oral ETT  Additional Equipment: Arterial line, CVP, PA Cath, TEE and Ultrasound Guidance Line Placement  Intra-op Plan:   Post-operative Plan: Post-operative intubation/ventilation  Informed Consent: I have reviewed the patients History and Physical, chart, labs and discussed the procedure including the risks, benefits and alternatives for the proposed anesthesia with the patient or authorized representative who has indicated his/her understanding and acceptance.     Dental advisory given  Plan Discussed with: CRNA, Anesthesiologist and Surgeon  Anesthesia Plan Comments:         Anesthesia Quick Evaluation

## 2021-05-22 NOTE — Anesthesia Procedure Notes (Signed)
Central Venous Catheter Insertion Performed by: Achille Rich, MD, anesthesiologist Start/End10/07/2021 7:22 AM, 05/22/2021 7:25 AM Patient location: Pre-op. Preanesthetic checklist: patient identified, IV checked, site marked, risks and benefits discussed, surgical consent, monitors and equipment checked, pre-op evaluation, timeout performed and anesthesia consent Hand hygiene performed  and maximum sterile barriers used  PA cath was placed.Swan type:thermodilution Procedure performed without using ultrasound guided technique. Attempts: 1 Patient tolerated the procedure well with no immediate complications.

## 2021-05-22 NOTE — Procedures (Signed)
Extubation Procedure Note  Patient Details:   Name: Crystal Silva DOB: 1961-05-01 MRN: [id number]   Airway Documentation:    Vent end date: (not recorded) Vent end time: (not recorded)   Evaluation  O2 sats: stable throughout Complications: No apparent complications Patient did tolerate procedure well. Bilateral Breath Sounds: Diminished, Clear   Yes, patient able to vocalize.  Patient extubated at 1650 per rapid wean protocol. NIF -30. VC 1.5L. RN at bedside. Patient placed on 4L nasal cannula. Vitals stable. RT will continue to monitor.  Farris Has 05/22/2021, 5:24 PM

## 2021-05-22 NOTE — Anesthesia Procedure Notes (Signed)
Arterial Line Insertion Start/End10/07/2021 7:45 AM, 05/22/2021 7:55 AM Performed by: Lelon Perla, CRNA, CRNA  Patient location: Pre-op. Preanesthetic checklist: patient identified, IV checked, site marked, risks and benefits discussed, surgical consent, monitors and equipment checked, pre-op evaluation, timeout performed and anesthesia consent Lidocaine 1% used for infiltration Left, radial was placed Catheter size: 20 G Hand hygiene performed  and maximum sterile barriers used   Attempts: 1 Procedure performed without using ultrasound guided technique. Following insertion, dressing applied and Biopatch. Post procedure assessment: normal and unchanged  Patient tolerated the procedure well with no immediate complications. Additional procedure comments: Performed by Shearon Balo under direct supervision of [place], CRNA.Marland Kitchen

## 2021-05-22 NOTE — Interval H&P Note (Signed)
History and Physical Interval Note:  05/22/2021 8:16 AM  Crystal Silva  has presented today for surgery, with the diagnosis of SEVERE AS.  The various methods of treatment have been discussed with the patient and family. After consideration of risks, benefits and other options for treatment, the patient has consented to  Procedure(s): AORTIC VALVE REPLACEMENT (AVR) (N/A) TRANSESOPHAGEAL ECHOCARDIOGRAM (TEE) (N/A) as a surgical intervention.  The patient's history has been reviewed, patient examined, no change in status, stable for surgery.  I have reviewed the patient's chart and labs.  Questions were answered to the patient's satisfaction.     [name]

## 2021-05-22 NOTE — Brief Op Note (Signed)
05/22/2021  11:09 AM  PATIENT:  Crystal Silva  60 y.o. female  PRE-OPERATIVE DIAGNOSIS:  SEVERE AORTIC STENOSIS  POST-OPERATIVE DIAGNOSIS:  SEVERE AORTIC STENOSIS, BICUSPID AORTIC VALVE  PROCEDURES:   AORTIC VALVE REPLACEMENT (AVR) WITH STERNAL PLATING. 23 MM INSPIRIS RESILIA  AORTIC VALVE.   TRANSESOPHAGEAL ECHOCARDIOGRAM   APPLICATION OF CELL SAVER   SURGEON:   [name], MD   PHYSICIAN ASSISTANT: Michaline Kindig  ASSISTANTS: Velvet Bathe, RN, Scrub Person   ANESTHESIA:   general  EBL:  Per anesthesia and perfusion records   BLOOD ADMINISTERED:none  DRAINS:  Mediastinal drains    LOCAL MEDICATIONS USED:  NONE  SPECIMEN:  Source of Specimen:  Aortic valve leaflets  DISPOSITION OF SPECIMEN:  PATHOLOGY  COUNTS:  YES  DICTATION: .Dragon Dictation  PLAN OF CARE: Admit to inpatient   PATIENT DISPOSITION:  ICU - intubated and critically ill.   Delay start of Pharmacological VTE agent (>24hrs) due to surgical blood loss or risk of bleeding: yes

## 2021-05-22 NOTE — Progress Notes (Signed)
  Echocardiogram Echocardiogram Transesophageal has been performed.  Crystal Silva 05/22/2021, 9:28 AM

## 2021-05-22 NOTE — Progress Notes (Signed)
Patient ID: Crystal Silva, female   DOB: 1960-11-14, 60 y.o.   MRN: [id number]  TCTS Evening Rounds:   Hemodynamically stable  CI = 2.4  Extubated and alert  Urine output good  CT output low  CBC    Component Value Date/Time   WBC 13.1 (H) 05/22/2021 1305   RBC 3.69 (L) 05/22/2021 1305   HGB 11.9 (L) 05/22/2021 1307   HCT 35.0 (L) 05/22/2021 1307   PLT 108 (L) 05/22/2021 1305   MCV 99.2 05/22/2021 1305   MCH 33.3 05/22/2021 1305   MCHC 33.6 05/22/2021 1305   RDW 13.2 05/22/2021 1305   LYMPHSABS 2.6 04/29/2021 0007   MONOABS 0.4 04/29/2021 0007   EOSABS 0.2 04/29/2021 0007   BASOSABS 0.1 04/29/2021 0007     BMET    Component Value Date/Time   NA 139 05/22/2021 1307   K 4.1 05/22/2021 1307   CL 104 05/22/2021 1143   CO2 22 05/20/2021 1436   GLUCOSE 119 (H) 05/22/2021 1143   BUN 13 05/22/2021 1143   CREATININE 0.30 (L) 05/22/2021 1143   CALCIUM 8.9 05/20/2021 1436   GFRNONAA >60 05/20/2021 1436     A/P:  Stable postop course. Continue current plans

## 2021-05-22 NOTE — Anesthesia Procedure Notes (Signed)
Central Venous Catheter Insertion Performed by: Achille Rich, MD, anesthesiologist Start/End10/07/2021 7:02 AM, 05/22/2021 7:23 AM Patient location: Pre-op. Preanesthetic checklist: patient identified, IV checked, site marked, risks and benefits discussed, surgical consent, monitors and equipment checked, pre-op evaluation, timeout performed and anesthesia consent Lidocaine 1% used for infiltration and patient sedated Hand hygiene performed  and maximum sterile barriers used  Catheter size: 8.5 Fr Sheath introducer Procedure performed using ultrasound guided technique. Ultrasound Notes:anatomy identified, needle tip was noted to be adjacent to the nerve/plexus identified, no ultrasound evidence of intravascular and/or intraneural injection and image(s) printed for medical record Attempts: 1 Following insertion, line sutured and dressing applied. Post procedure assessment: blood return through all ports, free fluid flow and no air  Patient tolerated the procedure well with no immediate complications.

## 2021-05-23 ENCOUNTER — Inpatient Hospital Stay (HOSPITAL_COMMUNITY): Payer: BC Managed Care – PPO

## 2021-05-23 LAB — CBC
HCT: 33.3 % — ABNORMAL LOW (ref 36.0–46.0)
HCT: 31.2 % — ABNORMAL LOW (ref 36.0–46.0)
Hemoglobin: 11.2 g/dL — ABNORMAL LOW (ref 12.0–15.0)
Hemoglobin: 10.4 g/dL — ABNORMAL LOW (ref 12.0–15.0)
MCH: 32.9 pg (ref 26.0–34.0)
MCH: 33.1 pg (ref 26.0–34.0)
MCHC: 33.6 g/dL (ref 30.0–36.0)
MCHC: 33.3 g/dL (ref 30.0–36.0)
MCV: 97.9 fL (ref 80.0–100.0)
MCV: 99.4 fL (ref 80.0–100.0)
Platelets: 80 10*3/uL — ABNORMAL LOW (ref 150–400)
Platelets: 60 10*3/uL — ABNORMAL LOW (ref 150–400)
RBC: 3.4 MIL/uL — ABNORMAL LOW (ref 3.87–5.11)
RBC: 3.14 MIL/uL — ABNORMAL LOW (ref 3.87–5.11)
RDW: 13.4 % (ref 11.5–15.5)
RDW: 13.8 % (ref 11.5–15.5)
WBC: 7.1 10*3/uL (ref 4.0–10.5)
WBC: 3.8 10*3/uL — ABNORMAL LOW (ref 4.0–10.5)
nRBC: 0 % (ref 0.0–0.2)
nRBC: 0 % (ref 0.0–0.2)

## 2021-05-23 LAB — GLUCOSE, CAPILLARY
Glucose-Capillary: 102 mg/dL — ABNORMAL HIGH (ref 70–99)
Glucose-Capillary: 103 mg/dL — ABNORMAL HIGH (ref 70–99)
Glucose-Capillary: 104 mg/dL — ABNORMAL HIGH (ref 70–99)
Glucose-Capillary: 105 mg/dL — ABNORMAL HIGH (ref 70–99)
Glucose-Capillary: 108 mg/dL — ABNORMAL HIGH (ref 70–99)
Glucose-Capillary: 125 mg/dL — ABNORMAL HIGH (ref 70–99)
Glucose-Capillary: 172 mg/dL — ABNORMAL HIGH (ref 70–99)
Glucose-Capillary: 96 mg/dL (ref 70–99)

## 2021-05-23 LAB — BASIC METABOLIC PANEL
Anion gap: 5 (ref 5–15)
Anion gap: 6 (ref 5–15)
BUN: 8 mg/dL (ref 6–20)
BUN: 11 mg/dL (ref 6–20)
CO2: 23 mmol/L (ref 22–32)
CO2: 24 mmol/L (ref 22–32)
Calcium: 7.7 mg/dL — ABNORMAL LOW (ref 8.9–10.3)
Calcium: 7.9 mg/dL — ABNORMAL LOW (ref 8.9–10.3)
Chloride: 108 mmol/L (ref 98–111)
Chloride: 107 mmol/L (ref 98–111)
Creatinine, Ser: 0.4 mg/dL — ABNORMAL LOW (ref 0.44–1.00)
Creatinine, Ser: 0.63 mg/dL (ref 0.44–1.00)
GFR, Estimated: >60 mL/min (ref >60)
GFR, Estimated: >60 mL/min (ref >60)
Glucose, Bld: 107 mg/dL — ABNORMAL HIGH (ref 70–99)
Glucose, Bld: 110 mg/dL — ABNORMAL HIGH (ref 70–99)
Potassium: 4 mmol/L (ref 3.5–5.1)
Potassium: 3.9 mmol/L (ref 3.5–5.1)
Sodium: 136 mmol/L (ref 135–145)
Sodium: 137 mmol/L (ref 135–145)

## 2021-05-23 LAB — SURGICAL PATHOLOGY

## 2021-05-23 LAB — MAGNESIUM
Magnesium: 2.6 mg/dL — ABNORMAL HIGH (ref 1.7–2.4)
Magnesium: 2.3 mg/dL (ref 1.7–2.4)

## 2021-05-23 MED ORDER — ALBUMIN HUMAN 5 % IV SOLN: 12.5000 g | Freq: Once | INTRAVENOUS | Status: DC

## 2021-05-23 MED ORDER — ALBUMIN HUMAN 25 % IV SOLN
12.5000 g | Freq: Once | INTRAVENOUS | Status: AC
  Administered 2021-05-23: 12.5 g via INTRAVENOUS
  Filled 2021-05-23: qty 50

## 2021-05-23 MED ORDER — INSULIN ASPART 100 UNIT/ML IJ SOLN
0.0000 [IU] | INTRAMUSCULAR | Status: DC
  Administered 2021-05-23: 4 [IU] via SUBCUTANEOUS

## 2021-05-23 MED ORDER — SODIUM CHLORIDE 0.9 % IV BOLUS: 250.0000 mL | Freq: Once | INTRAVENOUS | Status: AC

## 2021-05-23 NOTE — Op Note (Signed)
NAMEHURLEY, BLEVINS MEDICAL RECORD NO: [id number] ACCOUNT NO: [id number] DATE OF BIRTH: [birth year] FACILITY: [facility] LOCATION: [facility] PHYSICIAN: Salvatore Decent. Dorris Fetch, MD  Operative Report   DATE OF PROCEDURE: 05/22/2021  PREOPERATIVE DIAGNOSIS:  Severe aortic stenosis.  POSTOPERATIVE DIAGNOSIS:  Severe aortic stenosis.  PROCEDURES PERFORMED:   Median sternotomy, extracorporeal circulation, Aortic valve replacement with 23 mm Inspiris Resilia bovine pericardial valve (model number 11500A, serial number [id number]).  SURGEON:  Salvatore Decent. Dorris Fetch, MD  ASSISTANT:  Jillyn Hidden, PA  ANESTHESIA:  General.  FINDINGS:  Transesophageal echocardiography showed preserved left ventricular function with good wall motion pre and post-bypass, severe aortic stenosis with calcified bicuspid valve with fusion of the left and right cusps, good function of prosthetic  valve with no paravalvular leaks.  INTRAOPERATIVE FINDINGS:  Calcified aortic valve leaflets with fusion of left and right cusps, right coronary ostium in close proximity to the right noncoronary commissure, mild annular calcification.  CLINICAL NOTE:  Ms. Messman is a 60 year old woman who presented with dyspnea on exertion.  She was found to have severe aortic stenosis with preserved left ventricular function.  Catheterization revealed no coronary artery disease.  She was advised to  undergo aortic valve replacement.  The indications, risks, benefits, and alternatives were discussed in detail with the patient.  She understood and accepted the risks and agreed to proceed.  We did discuss the use of a mechanical versus tissue valve and  the relative advantages and disadvantages of each of those.  She wished to proceed with tissue valve replacement to avoid the need for lifelong anticoagulation.  She did accept the risk that she could require additional procedures for structural valve  deterioration in the future.  OPERATIVE NOTE:  Ms.  Boulter was brought to the preoperative holding area on 05/22/2021.  Anesthesia placed a Swan-Ganz catheter and arterial blood pressure monitoring lines.  She was taken to the operating room and anesthetized and intubated.  Dr.  Chaney Malling performed transesophageal echocardiography with findings as noted above.  Intravenous antibiotics were administered.  A Foley catheter was placed.  The chest, abdomen and legs were prepped and draped in the usual sterile fashion.  A timeout was performed.  A median sternotomy was performed.  There was sternal osteoporosis.  Hemostasis was achieved.  A sternal retractor was placed and gently opened over time.  The patient was fully heparinized.  The pericardium was opened.  The  ascending aorta was inspected.  It was elongated, but was not dilated.  There was no evidence of atherosclerotic disease.  After confirming adequate anticoagulation with ACT measurement, the aorta was cannulated via concentric 2-0 Ethibond pledgeted  pursestring sutures.  A dual stage venous cannula was placed via a pursestring suture in the right atrial appendage.  Cardiopulmonary bypass was initiated.  Flows were maintained per protocol.  The patient was cooled to 32 degrees Celsius.  A left  ventricular vent was placed via pursestring suture in the right superior pulmonary vein and a retrograde cardioplegia cannula was placed via pursestring suture in the right atrium and directed into the coronary sinus.  An antegrade cardioplegia cannula  was placed in the ascending aorta.  Carbon dioxide was insufflated into the operative field.  The ascending aorta was cross clamped.  The left ventricle was emptied via the vents.  Cardiac arrest was achieved with a combination of cold antegrade and retrograde blood KBC cardioplegia.  The  calculated dose was 1200 mL. The initial 600 mL was given antegrade.  There was  a rapid diastolic arrest.  An additional 600 mL was given retrograde.  There was septal cooling  to 10 degrees Celsius.  An aortotomy was performed just above the sinotubular junction and was extended into the noncoronary sinus.  The aortic valve leaflets were inspected.  It was a bicuspid valve with fusion of the left and right cusps.  The left coronary arose in a normal  location.  The right coronary arose relatively close to the right and noncoronary commissure.  The noncoronary cusp was slightly oversized.  The valve leaflets were excised.  There was mild annular calcification.  The calcium was debrided.  Care was  taken to contain all calcific debris.  The annulus was copiously irrigated with iced Saline.  The annulus sized for a 23 mm Edwards Inspiris Resilia bovine pericardial valve.  The valve was prepared per manufacturer's recommendations.  While that was  being done, a 2-0 Ethibond horizontal mattress sutures with subannular pledgets were placed circumferentially around the annulus.  Three commissural sutures then were placed and then 3 additional sutures were placed on the right and left annulus, the  noncoronary portion of the annulus required 4 sutures since it was relatively larger than the other two.  The sutures then were passed through the sewing ring of the valve.  The valve was lowered into place.  The sutures were secured using the Cor-Knot  device.  The valve was well seated.  Probing with a fine-tipped right angle revealed no gaps.  The left and right coronary ostia were inspected and there was no impingement.  Rewarming was begun.  The aortotomy was closed with 2 layers of running 4-0  Prolene suture, the first was a running horizontal mattress suture followed by a running simple suture.  At completion of the aortotomy closure, the patient was placed in Trendelenburg position with the aortic root vent on and the left ventricular vent off.  A reanimation dose of cardioplegia was given, this was 400 mL administered retrograde.  The left ventricle was de-aired and the aortic  crossclamp was removed.  The total crossclamp time was 61 minutes.  There was good hemostasis at the aortotomy.  While  rewarming was completed, inspection was made for hemostasis.  Epicardial pacing wires were placed on the right ventricle and right atrium and the patient spontaneously resumed sinus rhythm, but was bradycardic and atrial pacing was initiated at 80 beats  per minute.  The retrograde cardioplegia cannula was removed as well as the left ventricular vent. When the patient had reached a core temperature of 36.5 degrees Celsius, she was weaned from cardiopulmonary bypass on the first attempt.  Total bypass  time was 92 minutes.  Transesophageal echocardiography showed preserved left ventricular wall motion and good function of the prosthetic valve with no paravalvular leaks.  It should be noted that her lungs were relatively hyperinflated, both when  initially opened the chest and also when coming off bypass. The initial cardiac index was 1.5 liters per minute per meter squared, but did improve with time and volume resuscitation.  The aortic root vent was removed when there was no visible air on the  echocardiogram.  A test dose of protamine was administered and was well tolerated.  The atrial and aortic cannula were removed.  The remainder of the protamine was administered without incident.  The chest was irrigated with warm saline.  Hemostasis was  achieved.  A mediastinal chest tube was placed through a separate subcostal incision and secured with a #1  silk suture.  The KLS Daphine Deutscher longitudinal stabilization system was used with sternal closure.  The reinforcing plates were secured to each side of  the sternum using a combination of 11, 13 and 15 mm screws.  Sternal wires then were placed in standard fashion.  There was no hemodynamic change with sternal closure. The pectoralis fascia, subcutaneous tissue and skin were closed in standard fashion.   All sponge, needle and instrument counts were  correct at the end of the procedure.  The patient was taken from the operating room to the surgical intensive care unit, intubated and in good condition.   NIK D: 05/22/2021 6:10:22 pm T: 05/23/2021 2:37:00 am  JOB: [id number]/ [id number]

## 2021-05-23 NOTE — Progress Notes (Signed)
1 Day Post-Op Procedure(s) (LRB): AORTIC VALVE REPLACEMENT (AVR) WITH STERNAL PLATING. 23 MM INSPIRIS RESILIA  AORTIC VALVE. (N/A) TRANSESOPHAGEAL ECHOCARDIOGRAM (TEE) (N/A) APPLICATION OF CELL SAVER (N/A) Subjective: Some nausea and incisional pain  Objective: Vital signs in last 24 hours: Temp:  [95.2 F (35.1 C)-99.1 F (37.3 C)] 98.1 F (36.7 C) (10/13 0800) Pulse Rate:  [80-90] 89 (10/13 0800) Cardiac Rhythm: Atrial paced (10/13 0400) Resp:  [12-21] 14 (10/13 0800) BP: (84-127)/(55-83) 90/61 (10/13 0800) SpO2:  [94 %-100 %] 97 % (10/13 0800) Arterial Line BP: (74-142)/(37-76) 103/51 (10/13 0800) FiO2 (%):  [40 %-50 %] 40 % (10/12 1620) Weight:  [76.7 kg] 76.7 kg (10/13 0500)  Hemodynamic parameters for last 24 hours: PAP: (13-30)/(0-21) 29/18 CVP:  [3 mmHg] 3 mmHg CO:  [2.8 L/min-5.9 L/min] 5.8 L/min CI:  [1.5 L/min/m2-3.1 L/min/m2] 3.1 L/min/m2  Intake/Output from previous day: 10/12 0701 - 10/13 0700 In: 6533.8 [P.O.:50; I.V.:4076.2; Blood:430; IV Piggyback:1957.6] Out: 4690 [Urine:3440; Blood:900; Chest Tube:350] Intake/Output this shift: Total I/O In: 75 [I.V.:75] Out: 50 [Urine:50]  General appearance: alert, cooperative, and no distress Neurologic: intact Heart: regular rate and rhythm Lungs: diminished breath sounds bibasilar Abdomen: normal findings: soft, non-tender No air leak  Lab Results: Recent Labs    05/22/21 1906 05/23/21 0417  WBC 9.6 7.1  HGB 11.8* 11.2*  HCT 35.9* 33.3*  PLT 94* 80*   BMET:  Recent Labs    05/22/21 1906 05/23/21 0417  NA 139 136  K 4.1 4.0  CL 109 108  CO2 22 23  GLUCOSE 127* 107*  BUN 10 8  CREATININE 0.51 0.40*  CALCIUM 7.5* 7.7*    PT/INR:  Recent Labs    05/22/21 1305  LABPROT 17.4*  INR 1.4*   ABG    Component Value Date/Time   PHART 7.337 (L) 05/22/2021 1752   HCO3 22.7 05/22/2021 1752   TCO2 24 05/22/2021 1752   ACIDBASEDEF 3.0 (H) 05/22/2021 1752   O2SAT 98.0 05/22/2021 1752   CBG  (last 3)  Recent Labs    05/23/21 0203 05/23/21 0311 05/23/21 0411  GLUCAP 104* 108* 103*    Assessment/Plan: S/P Procedure(s) (LRB): AORTIC VALVE REPLACEMENT (AVR) WITH STERNAL PLATING. 23 MM INSPIRIS RESILIA  AORTIC VALVE. (N/A) TRANSESOPHAGEAL ECHOCARDIOGRAM (TEE) (N/A) APPLICATION OF CELL SAVER (N/A) POD # 1 NEURO- intact CV- in SR, atrial paced for rate  BP low- still on dopamine and neo. Excellent cardiac output  Still a little intravascularly dry- albumin  Tissue valve- plan ASA only unless needs anticoagulation for arryhthmia RESP- CXR shows a right pneumothorax- will repeat film at noon  COPD- bronchodilators RENAL- creatinine and lytes OK  Weight up. Hold off on diuresis until BP better ENDO- CBG well controlled GI- diet as tolerated Anemia secondary to ABL- mild, follow Thrombocytopenia- hold enoxaparin, monitor Keep CT in place as tip in  right pleural space Mobilize    LOS: 1 day    [name] 05/23/2021

## 2021-05-23 NOTE — Progress Notes (Signed)
      [address]       [place] [address]             [phone]      POD # 1 AVR  C/o right scapular pain  BP (!) 97/56   Pulse 80   Temp 98.5 F (36.9 C) (Oral)   Resp (!) 23   Ht 5\' 9"  (1.753 m)   Wt 76.7 kg   LMP 12/02/2013   SpO2 93%   BMI 24.97 kg/m  CXR shows right pneumothorax about the same when accounting for angulation 93 % saturation on 2L [place]   Intake/Output Summary (Last 24 hours) at 05/23/2021 1843 Last data filed at 05/23/2021 1800 Gross per 24 hour  Intake 2146.34 ml  Output 2260 ml  Net -113.66 ml   K= 3.9, creatinine 0.63 Hct = 31 PLT down slightly at 60K- not on heparin or lovenox  Overall doing well.  May need tube for pneumothorax but tolerating well right now  05/25/2021 C. Viviann Spare, MD [facility] [phone]

## 2021-05-23 NOTE — Plan of Care (Signed)

## 2021-05-24 ENCOUNTER — Inpatient Hospital Stay (HOSPITAL_COMMUNITY): Payer: BC Managed Care – PPO

## 2021-05-24 LAB — ECHO INTRAOPERATIVE TEE
AR max vel: 0.64 cm2
AV Area VTI: 0.56 cm2
AV Area mean vel: 0.59 cm2
AV Mean grad: 27 mmHg
AV Peak grad: 42.8 mmHg
Ao pk vel: 3.27 m/s
Height: 69 in
Weight: 2608 oz

## 2021-05-24 LAB — GLUCOSE, CAPILLARY
Glucose-Capillary: 108 mg/dL — ABNORMAL HIGH (ref 70–99)
Glucose-Capillary: 123 mg/dL — ABNORMAL HIGH (ref 70–99)
Glucose-Capillary: 91 mg/dL (ref 70–99)
Glucose-Capillary: 91 mg/dL (ref 70–99)
Glucose-Capillary: 97 mg/dL (ref 70–99)

## 2021-05-24 LAB — BASIC METABOLIC PANEL
Anion gap: 5 (ref 5–15)
BUN: 14 mg/dL (ref 6–20)
CO2: 23 mmol/L (ref 22–32)
Calcium: 8 mg/dL — ABNORMAL LOW (ref 8.9–10.3)
Chloride: 108 mmol/L (ref 98–111)
Creatinine, Ser: 0.54 mg/dL (ref 0.44–1.00)
GFR, Estimated: >60 mL/min (ref >60)
Glucose, Bld: 103 mg/dL — ABNORMAL HIGH (ref 70–99)
Potassium: 4.1 mmol/L (ref 3.5–5.1)
Sodium: 136 mmol/L (ref 135–145)

## 2021-05-24 LAB — TSH: TSH: 2.323 u[IU]/mL (ref 0.350–4.500)

## 2021-05-24 LAB — CBC
HCT: 31.4 % — ABNORMAL LOW (ref 36.0–46.0)
Hemoglobin: 10.2 g/dL — ABNORMAL LOW (ref 12.0–15.0)
MCH: 33.4 pg (ref 26.0–34.0)
MCHC: 32.5 g/dL (ref 30.0–36.0)
MCV: 103 fL — ABNORMAL HIGH (ref 80.0–100.0)
Platelets: 57 10*3/uL — ABNORMAL LOW (ref 150–400)
RBC: 3.05 MIL/uL — ABNORMAL LOW (ref 3.87–5.11)
RDW: 13.8 % (ref 11.5–15.5)
WBC: 3.6 10*3/uL — ABNORMAL LOW (ref 4.0–10.5)
nRBC: 0 % (ref 0.0–0.2)

## 2021-05-24 MED ORDER — ALUM & MAG HYDROXIDE-SIMETH 200-200-20 MG/5ML PO SUSP: 15.0000 mL | Freq: Four times a day (QID) | ORAL | Status: DC | PRN

## 2021-05-24 MED ORDER — KETOROLAC TROMETHAMINE 15 MG/ML IJ SOLN
15.0000 mg | Freq: Four times a day (QID) | INTRAMUSCULAR | Status: AC
  Administered 2021-05-24 (×3): 15 mg via INTRAVENOUS
  Filled 2021-05-24 (×3): qty 1

## 2021-05-24 MED ORDER — SODIUM CHLORIDE 0.9 % IV BOLUS
250.0000 mL | Freq: Once | INTRAVENOUS | Status: AC
Start: 2021-05-25 — End: 2021-05-24
  Administered 2021-05-24: 250 mL via INTRAVENOUS

## 2021-05-24 MED ORDER — ZOLPIDEM TARTRATE 5 MG PO TABS: 5.0000 mg | ORAL_TABLET | Freq: Every evening | ORAL | Status: DC | PRN

## 2021-05-24 MED ORDER — SODIUM CHLORIDE 0.9% FLUSH: 3.0000 mL | INTRAVENOUS | Status: DC | PRN

## 2021-05-24 MED ORDER — INSULIN ASPART 100 UNIT/ML IJ SOLN: 0.0000 [IU] | Freq: Three times a day (TID) | INTRAMUSCULAR | Status: DC

## 2021-05-24 MED ORDER — SODIUM CHLORIDE 0.9% FLUSH
3.0000 mL | Freq: Two times a day (BID) | INTRAVENOUS | Status: DC
  Administered 2021-05-24 – 2021-05-26 (×4): 3 mL via INTRAVENOUS

## 2021-05-24 MED ORDER — SODIUM CHLORIDE 0.9 % IV BOLUS
500.0000 mL | Freq: Once | INTRAVENOUS | Status: AC
  Administered 2021-05-24: 500 mL via INTRAVENOUS

## 2021-05-24 MED ORDER — AMIODARONE IV BOLUS ONLY 150 MG/100ML
150.0000 mg | Freq: Once | INTRAVENOUS | Status: AC
  Administered 2021-05-24: 150 mg via INTRAVENOUS
  Filled 2021-05-24: qty 100

## 2021-05-24 MED ORDER — MAGNESIUM HYDROXIDE 400 MG/5ML PO SUSP: 30.0000 mL | Freq: Every day | ORAL | Status: DC | PRN

## 2021-05-24 MED ORDER — AMIODARONE HCL 200 MG PO TABS
400.0000 mg | ORAL_TABLET | Freq: Two times a day (BID) | ORAL | Status: DC
Start: 2021-05-24 — End: 2021-05-26
  Administered 2021-05-24 – 2021-05-26 (×5): 400 mg via ORAL
  Filled 2021-05-24 (×6): qty 2

## 2021-05-24 MED ORDER — AMIODARONE LOAD VIA INFUSION: 150.0000 mg | Freq: Once | INTRAVENOUS | Status: DC

## 2021-05-24 MED ORDER — ~~LOC~~ CARDIAC SURGERY, PATIENT & FAMILY EDUCATION: Freq: Once | Status: AC

## 2021-05-24 MED ORDER — ALBUMIN HUMAN 25 % IV SOLN
12.5000 g | Freq: Once | INTRAVENOUS | Status: AC
  Administered 2021-05-24: 12.5 g via INTRAVENOUS
  Filled 2021-05-24: qty 50

## 2021-05-24 MED ORDER — SODIUM CHLORIDE 0.9 % IV SOLN: 250.0000 mL | INTRAVENOUS | Status: DC | PRN

## 2021-05-24 NOTE — Discharge Summary (Signed)
[address]       [place] [address]             [phone]    Physician Discharge Summary  Patient ID: Crystal Silva MRN: [id number] DOB/AGE: 59-23-1962 59 y.o. Referring: Lennie Odor, MD Primary Care: Patient, No Pcp Per (Inactive) Primary Cardiologist:Branch, Christiane Ha, MD   Admit date: 05/22/2021 Discharge date: 05/26/2021  Admission Diagnoses:  Patient Active Problem List   Diagnosis Date Noted   S/P aortic valve replacement 05/22/2021   DOE (dyspnea on exertion)    Severe aortic stenosis by prior echocardiogram    Chest pain 04/29/2021   Tobacco use disorder 04/29/2021   Elevated d-dimer 04/29/2021   COPD (chronic obstructive pulmonary disease) (HCC) 04/29/2021   Aortic valve disorder 04/29/2021   COPD exacerbation (HCC) 04/29/2021     Discharge Diagnoses:  Patient Active Problem List   Diagnosis Date Noted   S/P aortic valve replacement 05/22/2021   DOE (dyspnea on exertion)    Severe aortic stenosis by prior echocardiogram    Chest pain 04/29/2021   Tobacco use disorder 04/29/2021   Elevated d-dimer 04/29/2021   COPD (chronic obstructive pulmonary disease) (HCC) 04/29/2021   Aortic valve disorder 04/29/2021   COPD exacerbation (HCC) 04/29/2021     Discharged Condition: good  History of Present Illness:     [name] is a 60 year old female 64 pack-year tobacco smoker with no significant past medical history who presented to the emergency room on 04/28/2021 with dyspnea on exertion and sensation of her heart racing.  She described having similar symptoms over the past year but these have been more frequent and intense over the past 6 weeks. Work up in the emergency room included an EKG that showed normal sinus rhythm with no ischemic changes.  Serial troponins were 3 and 3.  BNP was 37.  CT angiogram of the chest demonstrated significant aortic valve calcification, no coronary calcification.  There was a 7 mm right upper lobe  nodule without lymphadenopathy for which repeat CT is recommended in 6 to 12 months was recommended.  She was also noted to have evidence of emphysema on CT scan.  Physical exam was notable for a prominent systolic murmur.  She was admitted to the hospital for further work-up.  An echocardiogram was obtained on 04/29/2021 confirmed severe aortic stenosis.  Left ventricular ejection fraction was estimated at 70 to 75%.  RV function was normal.  There was slight elevation in the EDP and PA systolic pressures.  The mean aortic valve gradient was measured at 40.2 mmHg with a peak gradient of 64 mmHg.  Calculated aortic valve area was 0.84 cm. Left heart catheterization was performed on 04/30/21 which demonstrated significant no obstructive coronary artery disease.  The patient was felt to be symptomatic from both chronic obstructive pulmonary disease and the aortic stenosis.  She has been treated with diuresis, oral steroids, and inhaled  bronchodilators and  has improved symptomatically.  CT surgery referral was made for consideration of surgical management of her severe aortic stenosis.  After evaluation by Dr. Dorris Fetch, aortic valve replacement was recommended and she decided to proceed.   Hospital Course: Mr. Was admitted for elective surgery on 05/22/2021 taken to the operating.  Aortic valve replacement was accomplished using a 23 mm Edwards bioprosthetic valve.  Following the procedure, she separated from cardiopulmonary bypass without any difficulty.  She was transferred to the cardiovascular ICU in stable condition.  Postoperative hospital course:  Following the procedure the patient has maintained intact normal status.  She initially did require atrial pacing for a short time and subsequently did develop a episode of atrial fibrillation.  She was chemically cardioverted to sinus rhythm with amiodarone and has been stable in this regard for over 2 days maintaining sinus rhythm.  She will be on oral  aspirin as this is a tissue valve.  On postop day 1 it was noted that she had a moderate size right-sided pneumothorax.  This did show some improvement on postoperative day #2 and she was monitored clinically.  She was managed with aggressive pulmonary hygiene as well as bronchodilators due to her known significant COPD.  She did have some postoperative expected volume overload and urine output has been good.  She was noted to have a postoperative thrombocytopenia which was monitored over time.  Due to this she was not giving any heparin products.  She is noted to have a expected acute blood loss anemia and values are stable.  She was started on a course of routine postoperative cardiac rehabilitation protocols.Oxygen has been weaned and she is maintaining good saturations on room air.  Incision is noted to be healing well without evidence of infection.  She is tolerating diet.  Overall at the time of discharge the patient is felt to be quite stable.     Consults: None  Significant Diagnostic Studies:  DG Orthopantogram  Result Date: 05/01/2021 CLINICAL DATA:  Preoperative evaluation prior to heart surgery. EXAM: ORTHOPANTOGRAM/PANORAMIC COMPARISON:  None. FINDINGS: Maxilla is completely edentulous without evidence of retained fragment. Mandible shows residual teeth numbers 21 through 26. No evidence of dental decay or periodontal disease. No residual root fragments elsewhere. IMPRESSION: Edentulous with the exception of mandibular teeth 21 through 26. No evidence of dental decay or periodontal disease. Electronically Signed   By: Paulina Fusi M.D.   On: 05/01/2021 13:48   DG Chest 2 View  Result Date: 05/25/2021 CLINICAL DATA:  Chest pain.  Evaluate apical pneumothorax EXAM: CHEST - 2 VIEW COMPARISON:  Yesterday FINDINGS: Prior median sternotomy. Midline trachea. Normal heart size. Right-sided hydropneumothorax with small volume pleural fluid and moderate volume pleural air. Visceral pleural line 3.5  cm from chest wall today versus 4.6 cm previously. Estimated at 20%. Trace left pleural fluid.  Right base atelectasis. IMPRESSION: Slight decrease in size of right-sided hydropneumothorax, as detailed above. Trace left pleural fluid. Electronically Signed   By: Jeronimo Greaves M.D.   On: 05/25/2021 08:18   DG Chest 2 View  Result Date: 05/22/2021 CLINICAL DATA:  Pre-op/severe aortic stenosis by prior echocardiogram. History of heart murmur EXAM: CHEST - 2 VIEW COMPARISON:  CT Angio Chest 05/01/2021; CT Angio Chest 04/29/2021. Chest two-view 04/28/2021 FINDINGS: Pulmonary hyperinflation. Lungs clear without infiltrate or effusion. Heart size and vascularity normal. IMPRESSION: No active cardiopulmonary disease. Electronically Signed   By: Marlan Palau M.D.   On: 05/22/2021 13:57   DG Chest 2 View  Result Date: 04/28/2021 CLINICAL DATA:  Shortness of breath EXAM: CHEST - 2 VIEW COMPARISON:  04/06/2020 FINDINGS: Hyperinflated lungs. The heart size and mediastinal contours are within normal limits. No focal pulmonary opacity. No pleural effusion or pneumothorax. No acute osseous abnormality. Right upper quadrant surgical clips. IMPRESSION: No acute cardiopulmonary disease. Electronically Signed   By: Wiliam Ke M.D.   On: 04/28/2021 22:27   CT Angio Chest PE W and/or Wo Contrast  Result Date: 04/29/2021 CLINICAL DATA:  PE suspected, low/intermediate prob, positive D-dimer. Dyspnea. EXAM:  CT ANGIOGRAPHY CHEST WITH CONTRAST TECHNIQUE: Multidetector CT imaging of the chest was performed using the standard protocol during bolus administration of intravenous contrast. Multiplanar CT image reconstructions and MIPs were obtained to evaluate the vascular anatomy. CONTRAST:  54mL OMNIPAQUE IOHEXOL 350 MG/ML SOLN COMPARISON:  None. FINDINGS: Cardiovascular: There is adequate opacification of the pulmonary arterial tree. No intraluminal filling defect identified to suggest acute pulmonary embolism. The central  pulmonary arteries are of normal caliber. There is calcification of the aortic valve leaflets noted. Global cardiac size within normal limits. No significant coronary artery calcification. No pericardial effusion. Mild atherosclerotic calcification noted within the thoracic aorta. No aortic aneurysm. Mediastinum/Nodes: No enlarged mediastinal, hilar, or axillary lymph nodes. Thyroid gland and esophagus demonstrate no significant findings. Lungs/Pleura: Moderate centrilobular emphysema. The lungs appear mildly hyperinflated. Mild diffuse bronchial wall thickening is in keeping with airway inflammation. Mean 7 mm noncalcified pulmonary nodule is seen within the right upper lobe, axial image # 67/6, indeterminate. No focal pulmonary infiltrate. No pneumothorax or pleural effusion. No central obstructing mass. Layering debris is seen within the distal trachea. Upper Abdomen: Limited images of the upper abdomen demonstrates severe progressive atrophy of the right hepatic lobe and compensatory hypertrophy of the left hepatic lobe when compared to prior examination of 05/02/2009. Surgical clips are seen within the hepatic hilum, of unclear significance. Stable intrahepatic biliary ductal dilation within the atrophic right hepatic lobe. No acute abnormality. Musculoskeletal: Degenerative changes are seen within the thoracic spine. No lytic or blastic bone lesions are seen. Remote T12 superior endplate fracture. Review of the MIP images confirms the above findings. IMPRESSION: No pulmonary embolism. Moderate centrilobular emphysema. Mild bronchial wall thickening in keeping with chronic airway inflammation. 7 mm noncalcified indeterminate pulmonary nodule within the right upper lobe. Non-contrast chest CT at 6-12 months is recommended. If the nodule is stable at time of repeat CT, then future CT at 18-24 months (from today's scan) is considered optional for low-risk patients, but is recommended for high-risk patients. This  recommendation follows the consensus statement: Guidelines for Management of Incidental Pulmonary Nodules Detected on CT Images: From the Fleischner Society 2017; Radiology 2017; 284:228-243. Aortic valve calcification. Echocardiography may be helpful to assess the degree of valvular dysfunction. Progressive marked atrophy of the right hepatic lobe, likely related to chronic biliary obstruction, incompletely evaluated on this examination. This was better assessed on CT examination of 05/02/2009. Emphysema (ICD10-J43.9). Electronically Signed   By: Helyn Numbers M.D.   On: 04/29/2021 01:26   CARDIAC CATHETERIZATION  Result Date: 04/30/2021   LV end diastolic pressure is moderately elevated.   Angiographically normal coronary arteries   Moderate Aortic Stenosis by Cath Gradient, Severe Aortic Stenosis by Echo SUMMARY Angiographically normal coronary arteries. Moderate Aortic Stenosis by cardiac cath, Severe Aortic Stenosis  by Echo. Moderately elevated LVEDP and PCWP with normal PAP. RECOMMENDATIONS Return to nursing unit for ongoing care. Anticipate TAVR team consultation.   CT CORONARY MORPH W/CTA COR W/SCORE W/CA W/CM &/OR WO/CM  Addendum Date: 05/01/2021   ADDENDUM REPORT: 05/01/2021 15:23 CLINICAL DATA:  Aortic stenosis EXAM: Cardiac TAVR CT TECHNIQUE: The patient was scanned on a Siemens Force 192 slice scanner. A 120 kV retrospective scan was triggered in the descending thoracic aorta at 111 HU's. Gantry rotation speed was 270 msecs and collimation was .9 mm. No beta blockade or nitro were given. The 3D data set was reconstructed in 5% intervals of the R-R cycle. Systolic and diastolic phases were analyzed on a dedicated work station  using MPR, MIP and VRT modes. The patient received 80 cc of contrast. FINDINGS: Aortic Valve: Bicuspid Sievers type one with raphe betwenn right and left cusps. Calcium score 868 Aorta: Normal arch vessels no aneurysm. Moderate calcific atherosclerosis Sinotubular  Junction: 26 mm Ascending Thoracic Aorta: 31 mm Aortic Arch: 23 mm Descending Thoracic Aorta: 24 mm Sinus of Valsalva Measurements: Non-coronary: 29.9 mm Right - coronary: 26.5 mm  Right sinus Height 16.1 mm Left - coronary: 26.8 mmLeft sinus height 15 mm Coronary Artery Height above Annulus: Left Main: 10.5 mm above annulus Right Coronary: 10.5 mm above annulus Virtual Basal Annulus Measurements: Maximum/Minimum Diameter: 21.4 mm x 25 mm Perimeter: 75.5 mm Area: 429 mm 2 Coronary Arteries: Somewhat shallow but adequate for deployment Optimum Fluoroscopic Angle for Delivery: RAO 2 Caudal 6 degrees IMPRESSION: 1. Bicuspid Sievers Type 1 AV with annular area of 429 mm2 upper limits of normal for a 23 mm Sapien 3 ultra valve. Given patients young age and fact her BMI is not very high this would seem reasonable. By perimeter she would measure for a 29 mm Metronic Evolut Pro valve however her sinus and linear diameter measurements seem small for this and again there would be concern for future access to her coronary arteries at such a young age of implant 2.  Somewhat shallow but adequate coronary height for deployment 3.  Optimum angiographic angle for deployment RAO 2 Caudal 6 degrees 4. No aortic aneurysm or coarctation ascending thoracic aorta 3.1 cm Charlton Haws Electronically Signed   By: Charlton Haws M.D.   On: 05/01/2021 15:23   Result Date: 05/01/2021 EXAM: OVER-READ INTERPRETATION  CT CHEST The following report is an over-read performed by radiologist Dr. Trudie Reed of [facility] on 05/01/2021. This over-read does not include interpretation of cardiac or coronary anatomy or pathology. The coronary calcium score/coronary CTA interpretation by the cardiologist is attached. COMPARISON:  None. FINDINGS: Extracardiac findings will be described separately under dictation for contemporaneously obtained CTA chest, abdomen pelvis. IMPRESSION: Please see separate dictation for contemporaneously  obtained CTA chest, abdomen and pelvis dated 05/01/2021 for full description of relevant extracardiac findings. Electronically Signed: By: Trudie Reed M.D. On: 05/01/2021 14:37   DG Chest 1V REPEAT Same Day  Result Date: 05/23/2021 CLINICAL DATA:  Right-sided pneumothorax. EXAM: CHEST - 1 VIEW SAME DAY COMPARISON:  Earlier same day FINDINGS: 1205 hours. Mild interval progression of the right sided pneumothorax. Thickness of pleural gas in the right apex measured previously at 3.7 cm is now 4.8 cm. There is more prominent pleural gas at the right base. Left lung remains clear. The cardiopericardial silhouette is within normal limits for size. Right IJ pulmonary catheter is been removed in the interval with right IJ sheath visualized in site. Midline mediastinal/pericardial drain remains in place. IMPRESSION: 1. Mild interval progression of right-sided pneumothorax. 2. Interval removal of pulmonary catheter. Electronically Signed   By: Kennith Center M.D.   On: 05/23/2021 14:57   DG Chest Port 1 View  Result Date: 05/24/2021 CLINICAL DATA:  Status post cardiac surgery. EXAM: PORTABLE CHEST 1 VIEW COMPARISON:  May 23, 2021. FINDINGS: The heart size and mediastinal contours are within normal limits. Status post aortic valve repair. Right internal jugular venous sheath is unchanged in position. Stable moderate size right apical pneumothorax is noted. Left lung is clear. Sternotomy wires are noted. The visualized skeletal structures are unremarkable. IMPRESSION: Stable moderate size right apical pneumothorax. Electronically Signed   By: Zenda Alpers.D.  On: 05/24/2021 08:52   DG Chest Port 1 View  Result Date: 05/23/2021 CLINICAL DATA:  Status post CABG EXAM: PORTABLE CHEST 1 VIEW COMPARISON:  Status post aortic valve replacement FINDINGS: Status post median sternotomy and aortic valve replacement. Pulmonary arterial catheter tip is in the right ventricular outflow tract. There is a  mediastinal drain in place. The endotracheal tube and nasogastric tubes have been removed. New right apical pneumothorax is identified. This is moderate in size measuring 3.7 cm over the right apex. Pulmonary vascular congestion identified. No pleural effusion or airspace consolidation. IMPRESSION: 1. New right apical pneumothorax. Moderate in size measuring 3.7 cm over the apex. 2. Pulmonary vascular congestion. 3. Status post CABG and aortic valve replacement. 4. Critical Value/emergent results were called by telephone at the time of interpretation on 05/23/2021 at 8:52 am to provider [facility] , who verbally acknowledged these results. Electronically Signed   By: Signa Kell M.D.   On: 05/23/2021 08:52   DG Chest Port 1 View  Result Date: 05/22/2021 CLINICAL DATA:  Aortic valve replacement EXAM: PORTABLE CHEST 1 VIEW COMPARISON:  05/20/2021 FINDINGS: Postop median sternotomy and aortic valve replacement. Prosthesis in satisfactory position. Endotracheal tube in good position. Swan-Ganz catheter in the main pulmonary artery. NG tube enters the stomach with the tip not visualized. Lungs are well aerated and clear. No infiltrate edema or pneumothorax. IMPRESSION: Satisfactory postop aortic valve replacement. Electronically Signed   By: Marlan Palau M.D.   On: 05/22/2021 13:55   CT ANGIO CHEST AORTA W/CM & OR WO/CM  Result Date: 05/01/2021 CLINICAL DATA:  60 year old female with history of severe aortic stenosis. Preprocedural study prior to potential transcatheter aortic valve replacement (TAVR) procedure. EXAM: CT ANGIOGRAPHY CHEST, ABDOMEN AND PELVIS TECHNIQUE: Multidetector CT imaging through the chest, abdomen and pelvis was performed using the standard protocol during bolus administration of intravenous contrast. Multiplanar reconstructed images and MIPs were obtained and reviewed to evaluate the vascular anatomy. CONTRAST:  OMNIPAQUE IOHEXOL 350 MG/ML SOLN COMPARISON:  Chest CTA  04/29/2021. FINDINGS: CTA CHEST FINDINGS Cardiovascular: Heart size is normal. There is no significant pericardial fluid, thickening or pericardial calcification. Aortic atherosclerosis. No definite coronary artery calcifications. Severe thickening and calcification of the aortic valve. Separate origin of the left vertebral artery directly off the aortic arch (normal anatomical variant) incidentally noted. Mediastinum/Lymph Nodes: No pathologically enlarged mediastinal or hilar lymph nodes. Esophagus is unremarkable in appearance. No axillary lymphadenopathy. Lungs/Pleura: 9 x 6 mm pulmonary nodule in the right upper lobe (axial image 49 of series 5), stable compared to the most recent prior examination. No other definite suspicious appearing pulmonary nodules or masses are noted. No acute consolidative airspace disease. No pleural effusions. Diffuse bronchial wall thickening with mild centrilobular and paraseptal emphysema. Musculoskeletal/Soft Tissues: There are no aggressive appearing lytic or blastic lesions noted in the visualized portions of the skeleton. CTA ABDOMEN AND PELVIS FINDINGS Hepatobiliary: Chronic atrophy in the right lobe of the liver with chronic intrahepatic biliary ductal dilatation involving portions of segments 5, 6, 7 and 8, similar to remote prior CT the abdomen and pelvis 05/02/2009, likely related to chronic central biliary tract obstruction. Patient is status post cholecystectomy with multiple surgical clips, including one clip in the expected region of obstruction. No suspicious cystic or solid hepatic lesions. Pancreas: No pancreatic mass. No pancreatic ductal dilatation. No pancreatic or peripancreatic fluid collections or inflammatory changes. Spleen: Unremarkable. Adrenals/Urinary Tract: No suspicious renal lesions. Bilateral adrenal glands are normal in appearance. No hydroureteronephrosis. Urinary  bladder is normal in appearance. Stomach/Bowel: The appearance of the stomach is  normal. No pathologic dilatation of small bowel or colon. A few scattered colonic diverticulae are noted, most evident in the descending colon and sigmoid colon, without surrounding inflammatory changes to suggest an acute diverticulitis at this time. Normal appendix. Vascular/Lymphatic: Aortic atherosclerosis, without evidence of aneurysm or dissection in the abdominal or pelvic vasculature. Vascular findings and measurements pertinent to potential TAVR procedure, as detailed below. No lymphadenopathy noted in the abdomen or pelvis. Reproductive: Uterus and ovaries are unremarkable in appearance. Other: No significant volume of ascites.  No pneumoperitoneum. Musculoskeletal: Bilateral pars defects are noted at L5 with 8 mm of anterolisthesis of L5 upon S1. There are no aggressive appearing lytic or blastic lesions noted in the visualized portions of the skeleton. VASCULAR MEASUREMENTS PERTINENT TO TAVR: AORTA: Minimal Aortic Diameter-12 x 9 mm Severity of Aortic Calcification-moderate RIGHT PELVIS: Right Common Iliac Artery - Minimal Diameter-7.4 x 6.5 mm Tortuosity-mild Calcification-moderate Right External Iliac Artery - Minimal Diameter-5.8 x 5.8 mm Tortuosity-mild-to-moderate Calcification-none Right Common Femoral Artery - Minimal Diameter-5.5 x 5.3 mm Tortuosity-mild Calcification-mild LEFT PELVIS: Left Common Iliac Artery - Minimal Diameter-6.8 x 7.2 mm Tortuosity-mild Calcification-mild Left External Iliac Artery - Minimal Diameter-5.7 x 5.1 mm Tortuosity-mild Calcification-minimal Left Common Femoral Artery - Minimal Diameter-6.0 x 5.5 mm Tortuosity-mild Calcification-mild Review of the MIP images confirms the above findings. IMPRESSION: 1. Vascular findings and measurements pertinent to potential TAVR procedure, as detailed above. 2. Severe thickening calcification of the aortic valve, compatible with reported clinical history of severe aortic stenosis. 3. Right upper lobe pulmonary nodule with a mean  diameter of 8 mm. Non-contrast chest CT at 6-12 months is recommended. If the nodule is stable at time of repeat CT, then future CT at 18-24 months (from today's scan) is considered optional for low-risk patients, but is recommended for high-risk patients. This recommendation follows the consensus statement: Guidelines for Management of Incidental Pulmonary Nodules Detected on CT Images: From the Fleischner Society 2017; Radiology 2017; 284:228-243. 4. Chronic atrophy in the right lobe of the liver likely related to chronic biliary tract obstruction (as evidence by chronic intrahepatic biliary ductal dilatation), similar to remote prior study, as above. 5. Colonic diverticulosis without evidence of acute diverticulitis at this time. 6. Grade 1 spondylolisthesis of L5 upon S1. 7. Additional incidental findings, as above. Electronically Signed   By: Trudie Reed M.D.   On: 05/01/2021 15:39   ECHOCARDIOGRAM COMPLETE  Result Date: 04/29/2021    ECHOCARDIOGRAM REPORT   Patient Name:   Crystal Silva Date of Exam: 04/29/2021 Medical Rec #:  [id number]     Height:       69.0 in Accession #:    [id number]    Weight:       163.3 lb Date of Birth:  [birth year]    BSA:          1.895 m Patient Age:    59 years      BP:           137/73 mmHg Patient Gender: F             HR:           70 bpm. [facility] Procedure: 2D Echo, Cardiac Doppler and Color Doppler Indications:    Aortic valve disorder  History:        Patient has no prior history of Echocardiogram examinations.  COPD, Signs/Symptoms:Chest Pain; Risk Factors:Tobacco use.  Sonographer:    Mikki Harbor Referring Phys: [id number] ASIA B ZIERLE-GHOSH IMPRESSIONS  1. Left ventricular ejection fraction, by estimation, is 70 to 75%. The left ventricle has hyperdynamic function. The left ventricle has no regional wall motion abnormalities. Left ventricular diastolic parameters were normal.  2. Right ventricular systolic function is normal.  The right ventricular size is normal. There is mildly elevated pulmonary artery systolic pressure.  3. The mitral valve is normal in structure. No evidence of mitral valve regurgitation. No evidence of mitral stenosis.  4. The aortic valve was not well visualized. There is moderate calcification of the aortic valve. There is moderate thickening of the aortic valve. Aortic valve regurgitation is not visualized. Severe aortic valve stenosis. Aortic valve mean gradient measures 40.2 mmHg. Aortic valve peak gradient measures 64.0 mmHg. Aortic valve area, by VTI measures 0.84 cm.     DI 0.27  5. The inferior vena cava is normal in size with greater than 50% respiratory variability, suggesting right atrial pressure of 3 mmHg. FINDINGS  Left Ventricle: Left ventricular ejection fraction, by estimation, is 70 to 75%. The left ventricle has hyperdynamic function. The left ventricle has no regional wall motion abnormalities. The left ventricular internal cavity size was normal in size. There is no left ventricular hypertrophy. Left ventricular diastolic parameters were normal. Right Ventricle: The right ventricular size is normal. No increase in right ventricular wall thickness. Right ventricular systolic function is normal. There is mildly elevated pulmonary artery systolic pressure. The tricuspid regurgitant velocity is 2.69  m/s, and with an assumed right atrial pressure of 8 mmHg, the estimated right ventricular systolic pressure is 36.9 mmHg. Left Atrium: Left atrial size was normal in size. Right Atrium: Right atrial size was normal in size. Pericardium: There is no evidence of pericardial effusion. Mitral Valve: The mitral valve is normal in structure. No evidence of mitral valve regurgitation. No evidence of mitral valve stenosis. MV peak gradient, 6.2 mmHg. The mean mitral valve gradient is 2.0 mmHg. Tricuspid Valve: The tricuspid valve is normal in structure. Tricuspid valve regurgitation is mild . No evidence of  tricuspid stenosis. Aortic Valve: The aortic valve was not well visualized. There is moderate calcification of the aortic valve. There is moderate thickening of the aortic valve. There is moderate aortic valve annular calcification. Aortic valve regurgitation is not visualized. Severe aortic stenosis is present. Aortic valve mean gradient measures 40.2 mmHg. Aortic valve peak gradient measures 64.0 mmHg. Aortic valve area, by VTI measures 0.84 cm. Pulmonic Valve: The pulmonic valve was not well visualized. Pulmonic valve regurgitation is not visualized. No evidence of pulmonic stenosis. Aorta: The aortic root is normal in size and structure. Venous: The inferior vena cava is normal in size with greater than 50% respiratory variability, suggesting right atrial pressure of 3 mmHg. IAS/Shunts: The interatrial septum was not well visualized.  LEFT VENTRICLE PLAX 2D LVIDd:         4.40 cm  Diastology LVIDs:         2.50 cm  LV e' medial:    9.14 cm/s LV PW:         0.80 cm  LV E/e' medial:  8.6 LV IVS:        0.90 cm  LV e' lateral:   10.10 cm/s LVOT diam:     2.00 cm  LV E/e' lateral: 7.8 LV SV:         80 LV SV Index:   42  LVOT Area:     3.14 cm  RIGHT VENTRICLE RV Basal diam:  2.65 cm RV Mid diam:    2.30 cm RV S prime:     14.80 cm/s TAPSE (M-mode): 2.6 cm LEFT ATRIUM             Index       RIGHT ATRIUM           Index LA diam:        3.10 cm 1.64 cm/m  RA Area:     11.70 cm LA Vol (A2C):   45.4 ml 23.95 ml/m RA Volume:   26.20 ml  13.82 ml/m LA Vol (A4C):   26.8 ml 14.14 ml/m LA Biplane Vol: 38.6 ml 20.36 ml/m  AORTIC VALVE AV Area (Vmax):    0.81 cm AV Area (Vmean):   0.81 cm AV Area (VTI):     0.84 cm AV Vmax:           400.00 cm/s AV Vmean:          289.970 cm/s AV VTI:            0.958 m AV Peak Grad:      64.0 mmHg AV Mean Grad:      40.2 mmHg LVOT Vmax:         103.00 cm/s LVOT Vmean:        74.800 cm/s LVOT VTI:          0.256 m LVOT/AV VTI ratio: 0.27  AORTA Ao Root diam: 3.20 cm Ao Asc diam:   3.00 cm MITRAL VALVE               TRICUSPID VALVE MV Area (PHT): 2.81 cm    TR Peak grad:   28.9 mmHg MV Area VTI:   2.62 cm    TR Vmax:        269.00 cm/s MV Peak grad:  6.2 mmHg MV Mean grad:  2.0 mmHg    SHUNTS MV Vmax:       1.24 m/s    Systemic VTI:  0.26 m MV Vmean:      65.6 cm/s   Systemic Diam: 2.00 cm MV Decel Time: 270 msec MV E velocity: 78.50 cm/s MV A velocity: 87.70 cm/s MV E/A ratio:  0.90 Dina Rich MD Electronically signed by Dina Rich MD Signature Date/Time: 04/29/2021/4:08:19 PM    Final    ECHO INTRAOPERATIVE TEE  Result Date: 05/24/2021  *INTRAOPERATIVE TRANSESOPHAGEAL REPORT *  Patient Name:   Crystal Silva  Date of Exam: 05/22/2021 Medical Rec #:  [id number]      Height:       69.0 in Accession #:    [id number]     Weight:       163.0 lb Date of Birth:  27-Aug-1960     BSA:          1.89 m Patient Age:    59 years       BP:           107/68 mmHg Patient Gender: F              HR:           92 bpm. Exam Location:  Anesthesiology Transesophogeal exam was perform intraoperatively during surgical procedure. Patient was closely monitored under general anesthesia during the entirety of examination. Indications:     Aortic Valve Disease Sonographer:     Eulah Pont RDCS Performing Phys: [id number] STEVEN C HENDRICKSON Diagnosing  Phys: Achille Rich MD Complications: No known complications during this procedure. POST-OP IMPRESSIONS _ Aortic Valve: A bioprosthetic bioprosthetic valve was placed, leaflets are freely mobile Manufactured by; inspiris resilia Size; 7mm. The gradient recorded across the prosthetic valve is within the expected range. No perivalvular leak noted. PRE-OP FINDINGS  Left Ventricle: The left ventricle has hyperdynamic systolic function, with an ejection fraction of >65%. The cavity size was normal. There is mildly increased left ventricular wall thickness. There is mild concentric left ventricular hypertrophy. Right Ventricle: The right ventricle has normal  systolic function. The cavity was normal. There is no increase in right ventricular wall thickness. Left Atrium: Left atrial size was normal in size. No left atrial/left atrial appendage thrombus was detected. Right Atrium: Right atrial size was normal in size. Interatrial Septum: No atrial level shunt detected by color flow Doppler. Pericardium: There is no evidence of pericardial effusion. Mitral Valve: The mitral valve is normal in structure. Mitral valve regurgitation is trivial by color flow Doppler. Tricuspid Valve: The tricuspid valve was normal in structure. Tricuspid valve regurgitation was not visualized by color flow Doppler. Aortic Valve: The aortic valve is tricuspid Aortic valve regurgitation was not visualized by color flow Doppler. There is severe stenosis of the aortic valve, with a calculated valve area of 0.56 cm. Right and left coronary cusps are fused. Heavy calcification present. Pulmonic Valve: The pulmonic valve was normal in structure. Pulmonic valve regurgitation is not visualized by color flow Doppler. Aorta: The aortic root, ascending aorta and aortic arch are normal in size and structure. +--------------+--------++ LEFT VENTRICLE         +--------------+--------++ PLAX 2D                +--------------+--------++ LVOT diam:    2.10 cm  +--------------+--------++ LVOT Area:    3.46 cm +--------------+--------++                        +--------------+--------++ +------------------+------------++ AORTIC VALVE                   +------------------+------------++ AV Area (Vmax):   0.64 cm     +------------------+------------++ AV Area (Vmean):  0.59 cm     +------------------+------------++ AV Area (VTI):    0.56 cm     +------------------+------------++ AV Vmax:          327.00 cm/s  +------------------+------------++ AV Vmean:         245.000 cm/s +------------------+------------++ AV VTI:           0.916 m       +------------------+------------++ AV Peak Grad:     42.8 mmHg    +------------------+------------++ AV Mean Grad:     27.0 mmHg    +------------------+------------++ LVOT Vmax:        60.60 cm/s   +------------------+------------++ LVOT Vmean:       41.400 cm/s  +------------------+------------++ LVOT VTI:         0.148 m      +------------------+------------++ LVOT/AV VTI ratio:0.16         +------------------+------------++  +--------------+-------+ SHUNTS                +--------------+-------+ Systemic VTI: 0.15 m  +--------------+-------+ Systemic Diam:2.10 cm +--------------+-------+  Achille Rich MD Electronically signed by Achille Rich MD Signature Date/Time: 05/24/2021/6:58:26 AM    Final    VAS US DOPPLER PRE CABG  Result Date: 05/01/2021 PREOPERATIVE VASCULAR EVALUATION Patient Name:  Crystal Silva  Date of Exam:  05/01/2021 Medical Rec #: [id number]      Accession #:    [id number] Date of Birth: 10-Oct-1960     Patient Gender: F Patient Age:   65 years Exam Location:  [facility] Procedure:      VAS US DOPPLER PRE CABG Referring Phys: T[place]ock O'NEAL --------------------------------------------------------------------------------  Indications:      Pre-AVR. Risk Factors:     Current smoker. Other Factors:    Severe aortic stenosis. Comparison Study: No prior studies. Performing Technologist: Jean Rosenthal RDMS, RVT  Examination Guidelines: A complete evaluation includes B-mode imaging, spectral Doppler, color Doppler, and power Doppler as needed of all accessible portions of each vessel. Bilateral testing is considered an integral part of a complete examination. Limited examinations for reoccurring indications may be performed as noted.  Right Carotid Findings: +----------+--------+--------+--------+--------+------------------+           PSV cm/sEDV cm/sStenosisDescribeComments           +----------+--------+--------+--------+--------+------------------+  CCA Prox  71      19                                         +----------+--------+--------+--------+--------+------------------+ CCA Distal81      26                                         +----------+--------+--------+--------+--------+------------------+ ICA Prox  65      21                      intimal thickening +----------+--------+--------+--------+--------+------------------+ ICA Distal83      34                                         +----------+--------+--------+--------+--------+------------------+ ECA       91      14                                         +----------+--------+--------+--------+--------+------------------+ +----------+--------+-------+----------------+------------+           PSV cm/sEDV cmsDescribe        Arm Pressure +----------+--------+-------+----------------+------------+ [id number]            Multiphasic, WNL             +----------+--------+-------+----------------+------------+ +---------+--------+--+--------+--+---------+ VertebralPSV cm/s67EDV cm/s22Antegrade +---------+--------+--+--------+--+---------+ Left Carotid Findings: +----------+--------+--------+--------+--------+------------------+           PSV cm/sEDV cm/sStenosisDescribeComments           +----------+--------+--------+--------+--------+------------------+ CCA Prox  94      21                                         +----------+--------+--------+--------+--------+------------------+ CCA Distal75      18                                         +----------+--------+--------+--------+--------+------------------+ ICA Prox  49      19  intimal thickening +----------+--------+--------+--------+--------+------------------+ ICA Distal53      23                                         +----------+--------+--------+--------+--------+------------------+ ECA       94      17                                          +----------+--------+--------+--------+--------+------------------+ +----------+--------+--------+----------------+------------+ SubclavianPSV cm/sEDV cm/sDescribe        Arm Pressure +----------+--------+--------+----------------+------------+           121             Multiphasic, WNL             +----------+--------+--------+----------------+------------+ +---------+--------+--+--------+--+---------+ VertebralPSV cm/s33EDV cm/s12Antegrade +---------+--------+--+--------+--+---------+  ABI Findings: +--------+------------------+-----+---------+--------+ Right   Rt Pressure (mmHg)IndexWaveform Comment  +--------+------------------+-----+---------+--------+ Brachial                       triphasic         +--------+------------------+-----+---------+--------+ +--------+------------------+-----+---------+-------+ Left    Lt Pressure (mmHg)IndexWaveform Comment +--------+------------------+-----+---------+-------+ Brachial                       triphasic        +--------+------------------+-----+---------+-------+  Right Doppler Findings: +--------+--------+-----+---------+--------+ Site    PressureIndexDoppler  Comments +--------+--------+-----+---------+--------+ Brachial             triphasic         +--------+--------+-----+---------+--------+ Radial               triphasic         +--------+--------+-----+---------+--------+ Ulnar                triphasic         +--------+--------+-----+---------+--------+  Left Doppler Findings: +--------+--------+-----+---------+--------+ Site    PressureIndexDoppler  Comments +--------+--------+-----+---------+--------+ Brachial             triphasic         +--------+--------+-----+---------+--------+ Radial               triphasic         +--------+--------+-----+---------+--------+ Ulnar                biphasic          +--------+--------+-----+---------+--------+  Summary:  Right Carotid: The extracranial vessels were near-normal with only minimal wall                thickening or plaque. Left Carotid: The extracranial vessels were near-normal with only minimal wall               thickening or plaque. Vertebrals:  Bilateral vertebral arteries demonstrate antegrade flow. Subclavians: Normal flow hemodynamics were seen in bilateral subclavian              arteries. Right Upper Extremity: Doppler waveform obliterate with right radial compression. Doppler waveforms remain within normal limits with right ulnar compression.  Left Upper Extremity: Doppler waveforms remain within normal limits with left radial compression. Doppler waveforms remain within normal limits with left ulnar compression.     Preliminary    CT ANGIO ABDOMEN PELVIS  W &/OR WO CONTRAST  Addendum Date: 05/01/2021   ADDENDUM REPORT: 05/01/2021 15:23 CLINICAL DATA:  Aortic stenosis EXAM: Cardiac TAVR CT TECHNIQUE:  The patient was scanned on a Siemens Force 192 slice scanner. A 120 kV retrospective scan was triggered in the descending thoracic aorta at 111 HU's. Gantry rotation speed was 270 msecs and collimation was .9 mm. No beta blockade or nitro were given. The 3D data set was reconstructed in 5% intervals of the R-R cycle. Systolic and diastolic phases were analyzed on a dedicated work station using MPR, MIP and VRT modes. The patient received 80 cc of contrast. FINDINGS: Aortic Valve: Bicuspid Sievers type one with raphe betwenn right and left cusps. Calcium score 868 Aorta: Normal arch vessels no aneurysm. Moderate calcific atherosclerosis Sinotubular Junction: 26 mm Ascending Thoracic Aorta: 31 mm Aortic Arch: 23 mm Descending Thoracic Aorta: 24 mm Sinus of Valsalva Measurements: Non-coronary: 29.9 mm Right - coronary: 26.5 mm  Right sinus Height 16.1 mm Left - coronary: 26.8 mmLeft sinus height 15 mm Coronary Artery Height above Annulus: Left Main: 10.5 mm above annulus Right Coronary: 10.5 mm above annulus  Virtual Basal Annulus Measurements: Maximum/Minimum Diameter: 21.4 mm x 25 mm Perimeter: 75.5 mm Area: 429 mm 2 Coronary Arteries: Somewhat shallow but adequate for deployment Optimum Fluoroscopic Angle for Delivery: RAO 2 Caudal 6 degrees IMPRESSION: 1. Bicuspid Sievers Type 1 AV with annular area of 429 mm2 upper limits of normal for a 23 mm Sapien 3 ultra valve. Given patients young age and fact her BMI is not very high this would seem reasonable. By perimeter she would measure for a 29 mm Metronic Evolut Pro valve however her sinus and linear diameter measurements seem small for this and again there would be concern for future access to her coronary arteries at such a young age of implant 2.  Somewhat shallow but adequate coronary height for deployment 3.  Optimum angiographic angle for deployment RAO 2 Caudal 6 degrees 4. No aortic aneurysm or coarctation ascending thoracic aorta 3.1 cm Charlton Haws Electronically Signed   By: Charlton Haws M.D.   On: 05/01/2021 15:23   Result Date: 05/01/2021 EXAM: OVER-READ INTERPRETATION  CT CHEST The following report is an over-read performed by radiologist Dr. Trudie Reed of [facility] on 05/01/2021. This over-read does not include interpretation of cardiac or coronary anatomy or pathology. The coronary calcium score/coronary CTA interpretation by the cardiologist is attached. COMPARISON:  None. FINDINGS: Extracardiac findings will be described separately under dictation for contemporaneously obtained CTA chest, abdomen pelvis. IMPRESSION: Please see separate dictation for contemporaneously obtained CTA chest, abdomen and pelvis dated 05/01/2021 for full description of relevant extracardiac findings. Electronically Signed: By: Trudie Reed M.D. On: 05/01/2021 14:37     Treatments: surgery:  Operative Report    DATE OF PROCEDURE: 05/22/2021   PREOPERATIVE DIAGNOSIS:  Severe aortic stenosis.   POSTOPERATIVE DIAGNOSIS:  Severe aortic  stenosis.   PROCEDURES PERFORMED:  Median sternotomy, extracorporeal circulation, aortic valve replacement with 23 mm Inspiris Resilia bovine pericardial valve (model number 11500A, serial number [id number]).   SURGEON:  Salvatore Decent. Dorris Fetch, MD   ASSISTANT:  Jillyn Hidden, PA   ANESTHESIA:  General. Operative Report    DATE OF PROCEDURE: 05/22/2021   PREOPERATIVE DIAGNOSIS:  Severe aortic stenosis.   POSTOPERATIVE DIAGNOSIS:  Severe aortic stenosis.   PROCEDURES PERFORMED:  Median sternotomy, extracorporeal circulation, aortic valve replacement with 23 mm Inspiris Resilia bovine pericardial valve (model number [id number], serial number [id number]).   SURGEON:  Salvatore Decent. Dorris Fetch, MD   ASSISTANT:  Jillyn Hidden, PA   ANESTHESIA:  General. Discharge Exam: Blood pressure 103/63, pulse  70, temperature 97.8 F (36.6 C), temperature source Oral, resp. rate 20, height  (1.753 m), weight 78.5 kg, last menstrual period 12/02/2013, SpO2 98 %.   General appearance: alert, cooperative, and no distress Heart: regular rate and rhythm and no murmur Lungs: clear to auscultation bilaterally Abdomen: benign Extremities: no edema Wound: incis healing well   Discharge Medications:  The patient has been discharged on:   1.Beta Blocker:  Yes [  y ]                              No   [   ]                              If No, reason:  2.Ace Inhibitor/ARB: Yes [   ]                                     No  [  n  ]                                     If No, reason:labile bp  3.Statin:   Yes [   ]                  No  [   n]                  If No, reason:  4.Ecasa:  Yes  [ y  ]                  No   [   ]                  If No, reason:  Patient had ACS upon admission:  Plavix/P2Y12 inhibitor: Yes [   ]                                      No  [  n ]     Discharge Instructions     Discharge patient   Complete by: As directed    After wires removed and stable for 4  hours   Discharge disposition: 01-Home or Self Care   Discharge patient date: 05/26/2021      Allergies as of 05/26/2021   No Known Allergies      Medication List     STOP taking these medications    azithromycin 250 MG tablet Commonly known as: ZITHROMAX   desloratadine 5 MG tablet Commonly known as: CLARINEX   HYDROcodone-acetaminophen 5-325 MG tablet Commonly known as: NORCO/VICODIN   predniSONE 10 MG tablet Commonly known as: DELTASONE       TAKE these medications    acetaminophen 500 MG tablet Commonly known as: TYLENOL Take 500 mg by mouth every 6 (six) hours as needed for moderate pain.   amiodarone 400 MG tablet Commonly known as: PACERONE Take 1 tablet (400 mg total) by mouth every 12 (twelve) hours. For 7 day, then once daily   aspirin 325 MG EC tablet Take 1 tablet (325 mg total) by mouth daily.   metoprolol tartrate 25 MG tablet Commonly  known as: LOPRESSOR Take 0.5 tablets (12.5 mg total) by mouth 2 (two) times daily.   midodrine 5 MG tablet Commonly known as: PROAMATINE Take 1 tablet (5 mg total) by mouth 3 (three) times daily with meals.   mometasone-formoterol 100-5 MCG/ACT Aero Commonly known as: DULERA Inhale 2 puffs into the lungs 2 (two) times daily as needed for wheezing or shortness of breath.   oxyCODONE 5 MG immediate release tablet Commonly known as: Oxy IR/ROXICODONE Take 1 tablet (5 mg total) by mouth every 6 (six) hours as needed for up to 7 days for severe pain.         Signed: @ 05/26/2021, 10:44 AM

## 2021-05-24 NOTE — Anesthesia Postprocedure Evaluation (Signed)
Anesthesia Post Note  Patient: Crystal Silva  Procedure(Silva) Performed: AORTIC VALVE REPLACEMENT (AVR) WITH STERNAL PLATING. 23 MM INSPIRIS RESILIA  AORTIC VALVE. (Chest) TRANSESOPHAGEAL ECHOCARDIOGRAM (TEE) APPLICATION OF CELL SAVER     Patient location during evaluation: SICU Anesthesia Type: General Level of consciousness: sedated Pain management: pain level controlled Vital Signs Assessment: post-procedure vital signs reviewed and stable Respiratory status: patient remains intubated per anesthesia plan Cardiovascular status: stable Postop Assessment: no apparent nausea or vomiting Anesthetic complications: no   No notable events documented.  Last Vitals:  Vitals:   05/24/21 0600 05/24/21 0645  BP: (!) 94/51   Pulse: 73   Resp: 19   Temp:  36.7 C  SpO2: 100%     Last Pain:  Vitals:   05/24/21 0645  TempSrc: Oral  PainSc:                  Crystal Silva

## 2021-05-24 NOTE — Progress Notes (Signed)
Patient with persistent soft SBP in 80's.  Notified Dr. Cliffton Asters See new orders

## 2021-05-24 NOTE — Progress Notes (Signed)
0700 - Report received from PM RN.  All questions answered.  Safety checks performed.  Hand hygiene performed before/after each pt contact. 0800 - Assessment. 0900 - Rx.  Pt ate approx. 10% of breakfast. 0930 - Pt assisted to bed.  Introducer & chest tube removed. 1130 - Lunch arrived.  Pt ate approx. 50% 1330 - Cardiac Rehab rounded on pt.   1400 - After ambulating with cardiac rehab, pt went into a-fib.  Dr. Dorris Fetch paged reference same.  He advised to talk to Mr. Crystal Silva, [place].  Mr. Crystal Silva was sent a secure chat to update him on pt's status.  New orders as noted. 1630 - Pt still in a-fib w/BPs in 80s-90s.  Mr. Crystal Silva, [place] was advised of same.  He ordered NS bolus and a second bolus of amiodarone.  Pt converted to NSR.  Mr. Crystal Silva updated. 1700 - Dinner arrived.  Pt ate approx. 50% Pt bathed.  Pt assisted to/from bathroom. 1900 - Report given to PM RN.  All questions answered.  Safety checks performed.   N: A&O x 4 R: RA when awake, 1L when sleeping C: NSR; episode of a-fib (rate 110s-140s) converted w/2 rounds of amio bolus; PWRT GI: cardiac diet; fair appetite; LBM PTA GU: Continent to bathroom/BSC Skin: mid-sternal incision, chest tube insertion site, pacer insertion sites Musk: sternal precautions, ambulatory with standby assist.  Access: 18g L hand

## 2021-05-24 NOTE — Progress Notes (Signed)
EVENING ROUNDS NOTE :     [address]       [place] [address]             [phone]                 2 Days Post-Op Procedure(s) (LRB): AORTIC VALVE REPLACEMENT (AVR) WITH STERNAL PLATING. 23 MM INSPIRIS RESILIA  AORTIC VALVE. (N/A) TRANSESOPHAGEAL ECHOCARDIOGRAM (TEE) (N/A) APPLICATION OF CELL SAVER (N/A)   Total Length of Stay:  LOS: 2 days  Events:   Doing well awaiting floor bed    BP (!) 80/50   Pulse (!) 110   Temp 98.3 F (36.8 C) (Oral)   Resp 17   Ht 5\' 9"  (1.753 m)   Wt 76.4 kg   LMP 12/02/2013   SpO2 92%   BMI 24.87 kg/m          sodium chloride      I/O last 3 completed shifts: In: 2033.1 [P.O.:50; I.V.:1160.2; Other:20; IV Piggyback:802.9] Out: 2315 [Urine:1965; Chest Tube:350]   CBC Latest Ref Rng & Units 05/24/2021 05/23/2021 05/23/2021  WBC 4.0 - 10.5 K/uL 3.6(L) 3.8(L) 7.1  Hemoglobin 12.0 - 15.0 g/dL 10.2(L) 10.4(L) 11.2(L)  Hematocrit 36.0 - 46.0 % 31.4(L) 31.2(L) 33.3(L)  Platelets 150 - 400 K/uL 57(L) 60(L) 80(L)    BMP Latest Ref Rng & Units 05/24/2021 05/23/2021 05/23/2021  Glucose 70 - 99 mg/dL 05/25/2021) 932(T) 557(D)  BUN 6 - 20 mg/dL 14 11 8   Creatinine 0.44 - 1.00 mg/dL 220(U 5.42)  Sodium 135 - 145 mmol/L 136 137 136  Potassium 3.5 - 5.1 mmol/L 4.1 3.9 4.0  Chloride 98 - 111 mmol/L 108 107 108  CO2 22 - 32 mmol/L 23 24 23   Calcium 8.9 - 10.3 mg/dL 8.0(L) 7.9(L) 7.7(L)    ABG    Component Value Date/Time   PHART 7.337 (L) 05/22/2021 1752   PCO2ART 42.2 05/22/2021 1752   PO2ART 103 05/22/2021 1752   HCO3 22.7 05/22/2021 1752   TCO2 24 05/22/2021 1752   ACIDBASEDEF 3.0 (H) 05/22/2021 1752   O2SAT 98.0 05/22/2021 1752       07/22/2021, MD 05/24/2021 5:36 PM

## 2021-05-24 NOTE — Progress Notes (Signed)
CARDIAC REHAB PHASE I   PRE:  Rate/Rhythm: 81 SR    BP: sitting 96/64    SaO2: 97 1L  MODE:  Ambulation: 290 ft   POST:  Rate/Rhythm: 140 afib    BP: sitting 92/75     SaO2: 94 1L  Pt moved to eOB and stood with verbal cues for sternal precautions. Used RW and ambulated to BR then ambulated hall with RW and 1L. Slow pace, no major c/o. Pt noted with  HR Afib 140 toward end of walk. To recliner and HR decreased to 110-120s. Pt notes she feels exerted or "fluttery". SaO2 and BP stable. Encouraged IS, RN aware of afib. [phone]  Harriet Masson CES, ACSM 05/24/2021 2:02 PM

## 2021-05-24 NOTE — Progress Notes (Signed)
2 Days Post-Op Procedure(s) (LRB): AORTIC VALVE REPLACEMENT (AVR) WITH STERNAL PLATING. 23 MM INSPIRIS RESILIA  AORTIC VALVE. (N/A) TRANSESOPHAGEAL ECHOCARDIOGRAM (TEE) (N/A) APPLICATION OF CELL SAVER (N/A) Subjective: Still c/o right subscapular pain  Objective: Vital signs in last 24 hours: Temp:  [98.1 F (36.7 C)-99.1 F (37.3 C)] 98.1 F (36.7 C) (10/14 0645) Pulse Rate:  [65-92] 75 (10/14 0700) Cardiac Rhythm: Normal sinus rhythm (10/14 0400) Resp:  [12-25] 18 (10/14 0700) BP: (90-115)/(51-83) 103/59 (10/14 0700) SpO2:  [92 %-100 %] 98 % (10/14 0700) Arterial Line BP: (98-120)/(43-62) 115/50 (10/13 1630) Weight:  [76.4 kg] 76.4 kg (10/14 0600)  Hemodynamic parameters for last 24 hours: PAP: (29)/(16-18) 29/16 CO:  [5.1 L/min] 5.1 L/min CI:  [2.7 L/min/m2] 2.7 L/min/m2  Intake/Output from previous day: 10/13 0701 - 10/14 0700 In: 729.5 [I.V.:326.6; IV Piggyback:402.9] Out: 835 [Urine:705; Chest Tube:130] Intake/Output this shift: No intake/output data recorded.  General appearance: alert, cooperative, and no distress Neurologic: intact Heart: regular rate and rhythm Lungs: BS slightly diminished on R, o/w clear Abdomen: normal findings: soft, non-tender No air leak  Lab Results: Recent Labs    05/23/21 1622 05/24/21 0353  WBC 3.8* 3.6*  HGB 10.4* 10.2*  HCT 31.2* 31.4*  PLT 60* 57*   BMET:  Recent Labs    05/23/21 1622 05/24/21 0353  NA 137 136  K 3.9 4.1  CL 107 108  CO2 24 23  GLUCOSE 110* 103*  BUN 11 14  CREATININE 0.63 0.54  CALCIUM 7.9* 8.0*    PT/INR:  Recent Labs    05/22/21 1305  LABPROT 17.4*  INR 1.4*   ABG    Component Value Date/Time   PHART 7.337 (L) 05/22/2021 1752   HCO3 22.7 05/22/2021 1752   TCO2 24 05/22/2021 1752   ACIDBASEDEF 3.0 (H) 05/22/2021 1752   O2SAT 98.0 05/22/2021 1752   CBG (last 3)  Recent Labs    05/24/21 0025 05/24/21 0424 05/24/21 0643  GLUCAP 91 97 108*    Assessment/Plan: S/P  Procedure(s) (LRB): AORTIC VALVE REPLACEMENT (AVR) WITH STERNAL PLATING. 23 MM INSPIRIS RESILIA  AORTIC VALVE. (N/A) TRANSESOPHAGEAL ECHOCARDIOGRAM (TEE) (N/A) APPLICATION OF CELL SAVER (N/A) POD # 2 \ NEURO- intact CV- in SR, BP still relatively low  ASA only for tissue valve unless she has A fib RESP_ right pneumothorax slightly decreased this AM  Continue bronchodilators for COPD RENAL- creatinine and lytes OK  Weight still up. I/O even yesterday  Monitor ENDO- CBG well controlled GI- no issues, diet as tolerated Anemia - secondary to ABL- Hgb stable Thrombocytopenia- PLT stable at 57K  No heparin, continue to monitor Dc chest tube Cardiac rehab Transfer to 4E   LOS: 2 days    [name] 05/24/2021

## 2021-05-25 ENCOUNTER — Inpatient Hospital Stay (HOSPITAL_COMMUNITY): Payer: BC Managed Care – PPO

## 2021-05-25 LAB — CBC
HCT: 29.3 % — ABNORMAL LOW (ref 36.0–46.0)
Hemoglobin: 9.8 g/dL — ABNORMAL LOW (ref 12.0–15.0)
MCH: 33 pg (ref 26.0–34.0)
MCHC: 33.4 g/dL (ref 30.0–36.0)
MCV: 98.7 fL (ref 80.0–100.0)
Platelets: 62 10*3/uL — ABNORMAL LOW (ref 150–400)
RBC: 2.97 MIL/uL — ABNORMAL LOW (ref 3.87–5.11)
RDW: 13.4 % (ref 11.5–15.5)
WBC: 3.1 10*3/uL — ABNORMAL LOW (ref 4.0–10.5)
nRBC: 0 % (ref 0.0–0.2)

## 2021-05-25 LAB — GLUCOSE, CAPILLARY
Glucose-Capillary: 110 mg/dL — ABNORMAL HIGH (ref 70–99)
Glucose-Capillary: 110 mg/dL — ABNORMAL HIGH (ref 70–99)
Glucose-Capillary: 113 mg/dL — ABNORMAL HIGH (ref 70–99)
Glucose-Capillary: 87 mg/dL (ref 70–99)
Glucose-Capillary: 91 mg/dL (ref 70–99)
Glucose-Capillary: 95 mg/dL (ref 70–99)

## 2021-05-25 LAB — BASIC METABOLIC PANEL
Anion gap: 8 (ref 5–15)
BUN: 15 mg/dL (ref 6–20)
CO2: 25 mmol/L (ref 22–32)
Calcium: 8.3 mg/dL — ABNORMAL LOW (ref 8.9–10.3)
Chloride: 103 mmol/L (ref 98–111)
Creatinine, Ser: 0.62 mg/dL (ref 0.44–1.00)
GFR, Estimated: >60 mL/min (ref >60)
Glucose, Bld: 105 mg/dL — ABNORMAL HIGH (ref 70–99)
Potassium: 4.1 mmol/L (ref 3.5–5.1)
Sodium: 136 mmol/L (ref 135–145)

## 2021-05-25 MED ORDER — MIDODRINE HCL 5 MG PO TABS
5.0000 mg | ORAL_TABLET | Freq: Three times a day (TID) | ORAL | Status: DC
  Administered 2021-05-25 – 2021-05-26 (×4): 5 mg via ORAL
  Filled 2021-05-25 (×4): qty 1

## 2021-05-25 NOTE — Progress Notes (Signed)
      [address]       [place] [address]             [phone]                 3 Days Post-Op Procedure(s) (LRB): AORTIC VALVE REPLACEMENT (AVR) WITH STERNAL PLATING. 23 MM INSPIRIS RESILIA  AORTIC VALVE. (N/A) TRANSESOPHAGEAL ECHOCARDIOGRAM (TEE) (N/A) APPLICATION OF CELL SAVER (N/A)   Events: Hypotensive overnight _______________________________________________________________ Vitals: BP (!) 99/55   Pulse 67   Temp 98 F (36.7 C) (Oral)   Resp 16   Ht 5\' 9"  (1.753 m)   Wt 77.7 kg   LMP 12/02/2013   SpO2 96%   BMI 25.30 kg/m  Filed Weights   05/23/21 0500 05/24/21 0600 05/25/21 0631  Weight: 76.7 kg 76.4 kg 77.7 kg     - Neuro: alert NAD  - Cardiovascular: sinus  Drips: none.      - Pulm: EWOB    ABG    Component Value Date/Time   PHART 7.337 (L) 05/22/2021 1752   PCO2ART 42.2 05/22/2021 1752   PO2ART 103 05/22/2021 1752   HCO3 22.7 05/22/2021 1752   TCO2 24 05/22/2021 1752   ACIDBASEDEF 3.0 (H) 05/22/2021 1752   O2SAT 98.0 05/22/2021 1752    - Abd: ND - Extremity: trace edema  .Intake/Output      10/14 0701 10/15 0700 10/15 0701 10/16 0700   P.O. 240    I.V. (mL/kg) 461.9 (5.9)    IV Piggyback 221.9    Total Intake(mL/kg) 923.9 (11.9)    Urine (mL/kg/hr) 800 (0.4)    Chest Tube     Total Output 800    Net +123.9         Urine Occurrence 1 x       _______________________________________________________________ Labs: CBC Latest Ref Rng & Units 05/25/2021 05/24/2021 05/23/2021  WBC 4.0 - 10.5 K/uL 3.1(L) 3.6(L) 3.8(L)  Hemoglobin 12.0 - 15.0 g/dL 05/25/2021) 10.2(L) 10.4(L)  Hematocrit 36.0 - 46.0 % 29.3(L) 31.4(L) 31.2(L)  Platelets 150 - 400 K/uL 62(L) 57(L) 60(L)   CMP Latest Ref Rng & Units 05/25/2021 05/24/2021 05/23/2021  Glucose 70 - 99 mg/dL 05/25/2021) 989(Q) 119(E)  BUN 6 - 20 mg/dL 15 14 11   Creatinine 0.44 - 1.00 mg/dL 174(Y 8.14  Sodium 135 - 145 mmol/L 136 136 137  Potassium 3.5 - 5.1 mmol/L 4.1 4.1  3.9  Chloride 98 - 111 mmol/L 103 108 107  CO2 22 - 32 mmol/L 25 23 24   Calcium 8.9 - 10.3 mg/dL 8.3(L) 8.0(L) 7.9(L)  Total Protein 6.5 - 8.1 g/dL - - -  Total Bilirubin 0.3 - 1.2 mg/dL - - -  Alkaline Phos 38 - 126 U/L - - -  AST 15 - 41 U/L - - -  ALT 0 - 44 U/L - - -    CXR: Right pneumoT slightly improved  _______________________________________________________________  Assessment and Plan: POD 3 s/p AVR  Neuro: pain controlled CV: holding parameters for metop.  Adding midodrine for BP.  Will keep pacing wires Pulm: continue pulm hygiene Renal: creat stable.  Holding diuresis for BP GI: on diet.  Encouraged fluids Heme: stable ID: [email or web address] Endo: SSI Dispo: floor   Thunder [place] O Bravlio Luca 05/25/2021 9:21 AM

## 2021-05-25 NOTE — Progress Notes (Signed)
CARDIAC REHAB PHASE I   PRE:  Rate/Rhythm: 77 SR    BP: sitting 118/72    SaO2: 93 RA  MODE:  Ambulation: 310 ft   POST:  Rate/Rhythm: 96 SR    BP: sitting 128/63     SaO2: 93 RA  Came earlier and pt with significant PACs and pt feeling "jittery". Returned one hour later and pt now without PACs, feeling better. Moved to EOB and stood with verbal reminders. Walked with RW. [place] today, no afib. SOB upon sitting on EOB after walk. Pt is undecided about RW for home. Encouraged more ambulation and IS.  [phone]  -Harriet Masson CES, ACSM 05/25/2021 12:40 PM

## 2021-05-26 LAB — GLUCOSE, CAPILLARY
Glucose-Capillary: 98 mg/dL (ref 70–99)
Glucose-Capillary: 137 mg/dL — ABNORMAL HIGH (ref 70–99)

## 2021-05-26 MED ORDER — METOPROLOL TARTRATE 25 MG PO TABS: 12.5000 mg | ORAL_TABLET | Freq: Two times a day (BID) | ORAL | 1 refills | Status: DC

## 2021-05-26 MED ORDER — AMIODARONE HCL 400 MG PO TABS: 400.0000 mg | ORAL_TABLET | Freq: Two times a day (BID) | ORAL | 1 refills | Status: DC

## 2021-05-26 MED ORDER — ASPIRIN 325 MG PO TBEC: 325.0000 mg | DELAYED_RELEASE_TABLET | Freq: Every day | ORAL | Status: DC

## 2021-05-26 MED ORDER — OXYCODONE HCL 5 MG PO TABS: 5.0000 mg | ORAL_TABLET | Freq: Four times a day (QID) | ORAL | 0 refills | Status: AC | PRN

## 2021-05-26 MED ORDER — MIDODRINE HCL 5 MG PO TABS: 5.0000 mg | ORAL_TABLET | Freq: Three times a day (TID) | ORAL | 0 refills | Status: DC

## 2021-05-26 NOTE — Progress Notes (Signed)
EPW pulled pert MD order. EPW intact upon removal. Pt tolerated activity well. Pt instructed to remain on bedrest x 1 and that VS will be checked q15 x 4. Will continue to monitor.  Crystal Silva

## 2021-05-26 NOTE — Progress Notes (Signed)
[address]       [place] [address]             [phone]      4 Days Post-Op Procedure(s) (LRB): AORTIC VALVE REPLACEMENT (AVR) WITH STERNAL PLATING. 23 MM INSPIRIS RESILIA  AORTIC VALVE. (N/A) TRANSESOPHAGEAL ECHOCARDIOGRAM (TEE) (N/A) APPLICATION OF CELL SAVER (N/A) Subjective: Feels pretty well overall  Objective: Vital signs in last 24 hours: Temp:  [97.8 F (36.6 C)-98.5 F (36.9 C)] 97.8 F (36.6 C) (10/16 0821) Pulse Rate:  [68-73] 70 (10/16 0821) Cardiac Rhythm: Normal sinus rhythm (10/16 0700) Resp:  [13-20] 20 (10/16 0821) BP: (95-132)/(50-65) 103/63 (10/16 0821) SpO2:  [88 %-100 %] 98 % (10/16 0821) Weight:  [78.5 kg] 78.5 kg (10/16 0500)  Hemodynamic parameters for last 24 hours:    Intake/Output from previous day: 10/15 0701 - 10/16 0700 In: -  Out: 200 [Urine:200] Intake/Output this shift: No intake/output data recorded.  General appearance: alert, cooperative, and no distress Heart: regular rate and rhythm and no murmur Lungs: clear to auscultation bilaterally Abdomen: benign Extremities: no edema Wound: incis healing well  Lab Results: Recent Labs    05/24/21 0353 05/25/21 0645  WBC 3.6* 3.1*  HGB 10.2* 9.8*  HCT 31.4* 29.3*  PLT 57* 62*   BMET:  Recent Labs    05/24/21 0353 05/25/21 0645  NA 136 136  K 4.1 4.1  CL 108 103  CO2 23 25  GLUCOSE 103* 105*  BUN 14 15  CREATININE 0.54 0.62  CALCIUM 8.0* 8.3*    PT/INR: No results for input(s): LABPROT, INR in the last 72 hours. ABG    Component Value Date/Time   PHART 7.337 (L) 05/22/2021 1752   HCO3 22.7 05/22/2021 1752   TCO2 24 05/22/2021 1752   ACIDBASEDEF 3.0 (H) 05/22/2021 1752   O2SAT 98.0 05/22/2021 1752   CBG (last 3)  Recent Labs    05/25/21 1629 05/25/21 2043 05/26/21 0604  GLUCAP 110* 113* 98    Meds Scheduled Meds:  acetaminophen  1,000 mg Oral Q6H   Or   acetaminophen (TYLENOL) oral liquid 160 mg/5 mL  1,000 mg Per Tube Q6H    amiodarone  400 mg Oral Q12H   aspirin EC  325 mg Oral Daily   Or   aspirin  324 mg Per Tube Daily   bisacodyl  10 mg Oral Daily   Or   bisacodyl  10 mg Rectal Daily   Chlorhexidine Gluconate Cloth  6 each Topical Daily   docusate sodium  200 mg Oral Daily   insulin aspart  0-9 Units Subcutaneous TID WC   mouth rinse  15 mL Mouth Rinse q12n4p   metoprolol tartrate  12.5 mg Oral BID   midodrine  5 mg Oral TID WC   mometasone-formoterol  2 puff Inhalation BID   pantoprazole  40 mg Oral Daily   sodium chloride flush  3 mL Intravenous Q12H   Continuous Infusions:  sodium chloride     PRN Meds:.sodium chloride, alum & mag hydroxide-simeth, levalbuterol, magnesium hydroxide, metoprolol tartrate, ondansetron (ZOFRAN) IV, oxyCODONE, sodium chloride flush, zolpidem  Xrays DG Chest 2 View  Result Date: 05/25/2021 CLINICAL DATA:  Chest pain.  Evaluate apical pneumothorax EXAM: CHEST - 2 VIEW COMPARISON:  Yesterday FINDINGS: Prior median sternotomy. Midline trachea. Normal heart size. Right-sided hydropneumothorax with small volume pleural fluid and moderate volume pleural air. Visceral pleural line 3.5 cm from chest wall today versus 4.6 cm previously.  Estimated at 20%. Trace left pleural fluid.  Right base atelectasis. IMPRESSION: Slight decrease in size of right-sided hydropneumothorax, as detailed above. Trace left pleural fluid. Electronically Signed   By: Jeronimo Greaves M.D.   On: 05/25/2021 08:18    Assessment/Plan: S/P Procedure(s) (LRB): AORTIC VALVE REPLACEMENT (AVR) WITH STERNAL PLATING. 23 MM INSPIRIS RESILIA  AORTIC VALVE. (N/A) TRANSESOPHAGEAL ECHOCARDIOGRAM (TEE) (N/A) APPLICATION OF CELL SAVER (N/A)  POD#4 1 Afeb, VSS  sBP 95-132, sinus rhythm, on po amio 2 sats good on RA 3 UOP- not accurate but appears adequate, weight up approx 5 Kg, appears euvolemic 4 TSH is normal 5 no new labs 6 BS ok, not a diabetic 7 d/c wires  8 stable for discharge if no new issues following  epw removal    LOS: 4 days    Rowe Clack PA-C Pager [phone] 05/26/2021

## 2021-05-26 NOTE — Plan of Care (Signed)
  Problem: Education: Goal: Knowledge of General Education information will improve Description Including pain rating scale, medication(s)/side effects and non-pharmacologic comfort measures Outcome: Progressing   

## 2021-05-26 NOTE — Plan of Care (Signed)
  Problem: Education: Goal: Knowledge of General Education information will improve Description: Including pain rating scale, medication(s)/side effects and non-pharmacologic comfort measures 05/26/2021 1304 by Roselyn Bering, RN Outcome: Adequate for Discharge 05/26/2021 1303 by Roselyn Bering, RN Outcome: Adequate for Discharge 05/26/2021 1302 by Roselyn Bering, RN Outcome: Progressing

## 2021-05-26 NOTE — Plan of Care (Signed)
  Problem: Education: Goal: Knowledge of General Education information will improve Description: Including pain rating scale, medication(s)/side effects and non-pharmacologic comfort measures 05/26/2021 1303 by Roselyn Bering, RN Outcome: Adequate for Discharge 05/26/2021 1302 by Roselyn Bering, RN Outcome: Progressing

## 2021-05-26 NOTE — Progress Notes (Signed)
Mobility Specialist Progress Note:   05/26/21 1050  Therapy Vitals  Pulse Rate 74  Mobility  Activity Ambulated in hall  Level of Assistance Standby assist, set-up cues, supervision of patient - no hands on  Assistive Device Front wheel walker  Distance Ambulated (ft) 428 ft  Mobility Ambulated with assistance in hallway  Mobility Response Tolerated well  Mobility performed by Mobility specialist  $Mobility charge 1 Mobility   Pre Mobility: HR 74 bpm During Mobility: HR 92 bpm Post Mobility: HR 80 bpm; BP 147/73  Pt received in chair, agreed to mobility. Ambulated in hall 360' with RW and 50' with no AD. Slow and steady gait without RW. Pt left in bed with RN present.   Addison Lank Mobility Specialist  Phone [phone]

## 2021-05-27 ENCOUNTER — Telehealth (HOSPITAL_COMMUNITY): Payer: Self-pay | Admitting: *Deleted

## 2021-05-27 ENCOUNTER — Encounter (HOSPITAL_COMMUNITY): Payer: Self-pay | Admitting: Thoracic Surgery (Cardiothoracic Vascular Surgery)

## 2021-05-27 DIAGNOSIS — Z952 Presence of prosthetic heart valve: Secondary | ICD-10-CM

## 2021-05-27 NOTE — Telephone Encounter (Signed)
Pt discharged over weekend therefore called at home to discuss education. Discussed IS, sternal precautions, walking for exercise, smoking cessation, and CRPII. Pt receptive and doing well. Unfortunately she has smoked since being home and voices she wants to slowly quit. Will send resources for assistance in mail. Will refer to [facility] CRPII.  [phone] Ethelda Chick CES, ACSM 10:32 AM 05/27/2021

## 2021-05-28 ENCOUNTER — Telehealth: Payer: Self-pay

## 2021-05-28 MED ORDER — METOPROLOL TARTRATE 25 MG PO TABS: 25.0000 mg | ORAL_TABLET | Freq: Two times a day (BID) | ORAL | 3 refills | Status: DC

## 2021-05-28 NOTE — Telephone Encounter (Signed)
Medication changes made per Rennis Harding, NP.

## 2021-05-29 ENCOUNTER — Encounter: Payer: Self-pay | Admitting: Thoracic Surgery (Cardiothoracic Vascular Surgery)

## 2021-05-29 ENCOUNTER — Telehealth: Payer: Self-pay | Admitting: *Deleted

## 2021-05-29 ENCOUNTER — Encounter: Payer: Self-pay | Admitting: *Deleted

## 2021-05-29 MED FILL — Sodium Chloride IV Soln 0.9%: INTRAVENOUS | Qty: 2000 | Status: AC

## 2021-05-29 MED FILL — Heparin Sodium (Porcine) Inj 1000 Unit/ML: INTRAMUSCULAR | Qty: 10 | Status: AC

## 2021-05-29 MED FILL — Sodium Bicarbonate IV Soln 8.4%: INTRAVENOUS | Qty: 50 | Status: AC

## 2021-05-29 MED FILL — Mannitol IV Soln 20%: INTRAVENOUS | Qty: 500 | Status: AC

## 2021-05-29 MED FILL — Lidocaine HCl Local Preservative Free (PF) Inj 2%: INTRAMUSCULAR | Qty: 15 | Status: AC

## 2021-05-29 MED FILL — Electrolyte-R (PH 7.4) Solution: INTRAVENOUS | Qty: 3000 | Status: AC

## 2021-05-29 NOTE — Telephone Encounter (Signed)
Reports worsening SOB since hospital discharge. Did not sound SOB on phone Reports continue to smoke Reports HR elevated with little activity. Reports HR 92 is highest reading when walking to bathroom Denies chest pain, headache, blurred vision, fever Reports rattle in throat and cough for past 2 days Reports some lightheadedness BP 156/86 HR 71 while on phone While on phone, heard hacking cough Reports after clearing her throat, the rattle sound clears up Gave sooner appointment to see Nena Polio, NP on 05/31/21 @2 :00 pm and advised if symptoms get worse, to go to the ED for an evaluation

## 2021-05-30 ENCOUNTER — Encounter (HOSPITAL_COMMUNITY): Payer: Self-pay | Admitting: Emergency Medicine

## 2021-05-30 ENCOUNTER — Emergency Department (HOSPITAL_COMMUNITY): Payer: BC Managed Care – PPO

## 2021-05-30 ENCOUNTER — Observation Stay (HOSPITAL_COMMUNITY)
Admission: EM | Admit: 2021-05-30 | Discharge: 2021-05-31 | Disposition: A | Discharge status: Home / Self Care | Payer: BC Managed Care – PPO | Attending: Internal Medicine | Admitting: Internal Medicine

## 2021-05-30 ENCOUNTER — Other Ambulatory Visit: Payer: Self-pay

## 2021-05-30 ENCOUNTER — Observation Stay (HOSPITAL_BASED_OUTPATIENT_CLINIC_OR_DEPARTMENT_OTHER): Payer: BC Managed Care – PPO

## 2021-05-30 DIAGNOSIS — I350 Nonrheumatic aortic (valve) stenosis: Secondary | ICD-10-CM

## 2021-05-30 DIAGNOSIS — Z20822 Contact with and (suspected) exposure to covid-19: Secondary | ICD-10-CM | POA: Insufficient documentation

## 2021-05-30 DIAGNOSIS — Z952 Presence of prosthetic heart valve: Secondary | ICD-10-CM

## 2021-05-30 DIAGNOSIS — R748 Abnormal levels of other serum enzymes: Secondary | ICD-10-CM | POA: Diagnosis not present

## 2021-05-30 DIAGNOSIS — R0602 Shortness of breath: Principal | ICD-10-CM | POA: Diagnosis present

## 2021-05-30 DIAGNOSIS — R7989 Other specified abnormal findings of blood chemistry: Secondary | ICD-10-CM

## 2021-05-30 DIAGNOSIS — I509 Heart failure, unspecified: Secondary | ICD-10-CM

## 2021-05-30 DIAGNOSIS — J90 Pleural effusion, not elsewhere classified: Secondary | ICD-10-CM | POA: Diagnosis not present

## 2021-05-30 DIAGNOSIS — J449 Chronic obstructive pulmonary disease, unspecified: Secondary | ICD-10-CM | POA: Insufficient documentation

## 2021-05-30 DIAGNOSIS — F17210 Nicotine dependence, cigarettes, uncomplicated: Secondary | ICD-10-CM | POA: Insufficient documentation

## 2021-05-30 DIAGNOSIS — R7401 Elevation of levels of liver transaminase levels: Secondary | ICD-10-CM | POA: Diagnosis not present

## 2021-05-30 DIAGNOSIS — J939 Pneumothorax, unspecified: Secondary | ICD-10-CM | POA: Diagnosis not present

## 2021-05-30 DIAGNOSIS — J948 Other specified pleural conditions: Secondary | ICD-10-CM | POA: Diagnosis not present

## 2021-05-30 DIAGNOSIS — J9811 Atelectasis: Secondary | ICD-10-CM | POA: Diagnosis not present

## 2021-05-30 DIAGNOSIS — Z7982 Long term (current) use of aspirin: Secondary | ICD-10-CM | POA: Insufficient documentation

## 2021-05-30 DIAGNOSIS — R778 Other specified abnormalities of plasma proteins: Secondary | ICD-10-CM

## 2021-05-30 LAB — COMPREHENSIVE METABOLIC PANEL
ALT: 15 U/L (ref 0–44)
ALT: 17 U/L (ref 0–44)
AST: 16 U/L (ref 15–41)
AST: 22 U/L (ref 15–41)
Albumin: 3.5 g/dL (ref 3.5–5.0)
Albumin: 3.4 g/dL — ABNORMAL LOW (ref 3.5–5.0)
Alkaline Phosphatase: 38 U/L (ref 38–126)
Alkaline Phosphatase: 39 U/L (ref 38–126)
Anion gap: 7 (ref 5–15)
Anion gap: 7 (ref 5–15)
BUN: 15 mg/dL (ref 6–20)
BUN: 14 mg/dL (ref 6–20)
CO2: 29 mmol/L (ref 22–32)
CO2: 28 mmol/L (ref 22–32)
Calcium: 8.7 mg/dL — ABNORMAL LOW (ref 8.9–10.3)
Calcium: 8.6 mg/dL — ABNORMAL LOW (ref 8.9–10.3)
Chloride: 102 mmol/L (ref 98–111)
Chloride: 102 mmol/L (ref 98–111)
Creatinine, Ser: 0.67 mg/dL (ref 0.44–1.00)
Creatinine, Ser: 0.55 mg/dL (ref 0.44–1.00)
GFR, Estimated: >60 mL/min (ref >60)
GFR, Estimated: >60 mL/min (ref >60)
Glucose, Bld: 119 mg/dL — ABNORMAL HIGH (ref 70–99)
Glucose, Bld: 129 mg/dL — ABNORMAL HIGH (ref 70–99)
Potassium: 3.5 mmol/L (ref 3.5–5.1)
Potassium: 3.8 mmol/L (ref 3.5–5.1)
Sodium: 138 mmol/L (ref 135–145)
Sodium: 137 mmol/L (ref 135–145)
Total Bilirubin: 0.6 mg/dL (ref 0.3–1.2)
Total Bilirubin: 0.5 mg/dL (ref 0.3–1.2)
Total Protein: 6.1 g/dL — ABNORMAL LOW (ref 6.5–8.1)
Total Protein: 6.1 g/dL — ABNORMAL LOW (ref 6.5–8.1)

## 2021-05-30 LAB — CBC
HCT: 34.9 % — ABNORMAL LOW (ref 36.0–46.0)
HCT: 35.8 % — ABNORMAL LOW (ref 36.0–46.0)
Hemoglobin: 11.3 g/dL — ABNORMAL LOW (ref 12.0–15.0)
Hemoglobin: 11.5 g/dL — ABNORMAL LOW (ref 12.0–15.0)
MCH: 32.9 pg (ref 26.0–34.0)
MCH: 33.2 pg (ref 26.0–34.0)
MCHC: 32.1 g/dL (ref 30.0–36.0)
MCHC: 32.4 g/dL (ref 30.0–36.0)
MCV: 102.3 fL — ABNORMAL HIGH (ref 80.0–100.0)
MCV: 102.6 fL — ABNORMAL HIGH (ref 80.0–100.0)
Platelets: 198 10*3/uL (ref 150–400)
Platelets: 217 10*3/uL (ref 150–400)
RBC: 3.4 MIL/uL — ABNORMAL LOW (ref 3.87–5.11)
RBC: 3.5 MIL/uL — ABNORMAL LOW (ref 3.87–5.11)
RDW: 14.5 % (ref 11.5–15.5)
RDW: 14.6 % (ref 11.5–15.5)
WBC: 6.4 10*3/uL (ref 4.0–10.5)
WBC: 8.1 10*3/uL (ref 4.0–10.5)
nRBC: 0 % (ref 0.0–0.2)
nRBC: 0 % (ref 0.0–0.2)

## 2021-05-30 LAB — PROTIME-INR
INR: 1.1 (ref 0.8–1.2)
INR: 1.2 (ref 0.8–1.2)
Prothrombin Time: 14.5 seconds (ref 11.4–15.2)
Prothrombin Time: 14.7 seconds (ref 11.4–15.2)

## 2021-05-30 LAB — BLOOD GAS, VENOUS
Acid-Base Excess: 6.3 mmol/L — ABNORMAL HIGH (ref 0.0–2.0)
Bicarbonate: 28.1 mmol/L — ABNORMAL HIGH (ref 20.0–28.0)
FIO2: 28
O2 Saturation: 31.9 %
Patient temperature: 36.6
pCO2, Ven: 51.6 mmHg (ref 44.0–60.0)
pH, Ven: 7.396 (ref 7.250–7.430)
pO2, Ven: <31 mmHg — CL (ref 32.0–45.0)

## 2021-05-30 LAB — RESP PANEL BY RT-PCR (FLU A&B, COVID) ARPGX2
Influenza A by PCR: NEGATIVE
Influenza B by PCR: NEGATIVE
SARS Coronavirus 2 by RT PCR: NEGATIVE

## 2021-05-30 LAB — ECHOCARDIOGRAM LIMITED
Height: 69 in
S' Lateral: 2.9 cm
Weight: 2768.98 oz

## 2021-05-30 LAB — APTT
aPTT: 32 seconds (ref 24–36)
aPTT: 33 seconds (ref 24–36)

## 2021-05-30 LAB — BRAIN NATRIURETIC PEPTIDE: B Natriuretic Peptide: 378 pg/mL — ABNORMAL HIGH (ref 0.0–100.0)

## 2021-05-30 LAB — LACTIC ACID, PLASMA: Lactic Acid, Venous: 1.9 mmol/L (ref 0.5–1.9)

## 2021-05-30 LAB — TROPONIN I (HIGH SENSITIVITY)
Troponin I (High Sensitivity): 63 ng/L — ABNORMAL HIGH (ref <18)
Troponin I (High Sensitivity): 48 ng/L — ABNORMAL HIGH (ref <18)

## 2021-05-30 LAB — MAGNESIUM: Magnesium: 1.8 mg/dL (ref 1.7–2.4)

## 2021-05-30 LAB — PHOSPHORUS: Phosphorus: 3.8 mg/dL (ref 2.5–4.6)

## 2021-05-30 MED ORDER — METHYLPREDNISOLONE SODIUM SUCC 125 MG IJ SOLR
125.0000 mg | Freq: Once | INTRAMUSCULAR | Status: AC
  Administered 2021-05-30: 125 mg via INTRAVENOUS
  Filled 2021-05-30: qty 2

## 2021-05-30 MED ORDER — IPRATROPIUM-ALBUTEROL 0.5-2.5 (3) MG/3ML IN SOLN
3.0000 mL | Freq: Once | RESPIRATORY_TRACT | Status: AC
  Administered 2021-05-30: 3 mL via RESPIRATORY_TRACT
  Filled 2021-05-30: qty 3

## 2021-05-30 MED ORDER — ACETAMINOPHEN 500 MG PO TABS: 500.0000 mg | ORAL_TABLET | Freq: Four times a day (QID) | ORAL | Status: DC | PRN

## 2021-05-30 MED ORDER — MIDODRINE HCL 5 MG PO TABS
5.0000 mg | ORAL_TABLET | Freq: Three times a day (TID) | ORAL | Status: DC
  Administered 2021-05-30 – 2021-05-31 (×3): 5 mg via ORAL
  Filled 2021-05-30 (×3): qty 1

## 2021-05-30 MED ORDER — ENOXAPARIN SODIUM 40 MG/0.4ML IJ SOSY
40.0000 mg | PREFILLED_SYRINGE | INTRAMUSCULAR | Status: DC
  Filled 2021-05-30: qty 0.4

## 2021-05-30 MED ORDER — METOPROLOL TARTRATE 25 MG PO TABS
25.0000 mg | ORAL_TABLET | Freq: Two times a day (BID) | ORAL | Status: DC
  Administered 2021-05-30 – 2021-05-31 (×3): 25 mg via ORAL
  Filled 2021-05-30 (×3): qty 1

## 2021-05-30 MED ORDER — AMIODARONE HCL 200 MG PO TABS
400.0000 mg | ORAL_TABLET | Freq: Every day | ORAL | Status: DC
  Administered 2021-05-30 – 2021-05-31 (×2): 400 mg via ORAL
  Filled 2021-05-30 (×2): qty 2

## 2021-05-30 MED ORDER — GUAIFENESIN ER 600 MG PO TB12
1200.0000 mg | ORAL_TABLET | Freq: Two times a day (BID) | ORAL | Status: DC
  Administered 2021-05-30 – 2021-05-31 (×2): 1200 mg via ORAL
  Filled 2021-05-30 (×3): qty 2

## 2021-05-30 MED ORDER — MOMETASONE FURO-FORMOTEROL FUM 100-5 MCG/ACT IN AERO
2.0000 | INHALATION_SPRAY | Freq: Two times a day (BID) | RESPIRATORY_TRACT | Status: DC
  Administered 2021-05-30 – 2021-05-31 (×3): 2 via RESPIRATORY_TRACT
  Filled 2021-05-30 (×2): qty 8.8

## 2021-05-30 MED ORDER — PREDNISONE 20 MG PO TABS
40.0000 mg | ORAL_TABLET | Freq: Every day | ORAL | Status: DC
  Administered 2021-05-30 – 2021-05-31 (×2): 40 mg via ORAL
  Filled 2021-05-30 (×2): qty 2

## 2021-05-30 MED ORDER — IOHEXOL 350 MG/ML SOLN
100.0000 mL | Freq: Once | INTRAVENOUS | Status: AC | PRN
  Administered 2021-05-30: 100 mL via INTRAVENOUS

## 2021-05-30 MED ORDER — OXYCODONE HCL 5 MG PO TABS
5.0000 mg | ORAL_TABLET | Freq: Four times a day (QID) | ORAL | Status: DC | PRN
  Administered 2021-05-30 – 2021-05-31 (×2): 5 mg via ORAL
  Filled 2021-05-30 (×2): qty 1

## 2021-05-30 MED ORDER — ALBUTEROL SULFATE (2.5 MG/3ML) 0.083% IN NEBU: 2.5000 mg | INHALATION_SOLUTION | RESPIRATORY_TRACT | Status: DC | PRN

## 2021-05-30 MED ORDER — FENTANYL CITRATE PF 50 MCG/ML IJ SOSY
50.0000 ug | PREFILLED_SYRINGE | Freq: Once | INTRAMUSCULAR | Status: AC
  Administered 2021-05-30: 50 ug via INTRAVENOUS
  Filled 2021-05-30: qty 1

## 2021-05-30 MED ORDER — AMIODARONE HCL 200 MG PO TABS: 400.0000 mg | ORAL_TABLET | Freq: Two times a day (BID) | ORAL | Status: DC

## 2021-05-30 MED ORDER — ASPIRIN EC 325 MG PO TBEC
325.0000 mg | DELAYED_RELEASE_TABLET | Freq: Every day | ORAL | Status: DC
  Administered 2021-05-30 – 2021-05-31 (×2): 325 mg via ORAL
  Filled 2021-05-30 (×2): qty 1

## 2021-05-30 NOTE — Progress Notes (Signed)
*  PRELIMINARY RESULTS* Echocardiogram Limited 2D Echocardiogram has been performed.  Crystal Silva 05/30/2021, 10:40 AM

## 2021-05-30 NOTE — H&P (Addendum)
History and Physical  [facility] GLO:[id number] DOB: 07/12/1961 DOA: 05/30/2021  Referring physician: Sabas Sous, MD PCP: Patient, No Pcp Per (Inactive)  Patient coming from: Home  Chief Complaint: Shortness of breath  HPI: [facility] is a 60 y.o. female with medical history significant for tobacco use, AS s/p valve replacement (05/22/2021) who presents to the emergency department due to 2-day onset of shortness of breath and increased leg swelling.  Patient states that she was recently discharged from [facility] after having an aortic valve replacement, she complained of right shoulder pain which started after the surgical procedure.  She states that she noted leg swelling and increased shortness of breath which started on Monday night (10/17), shortness of breath worsens with exertion.  Unfortunately, patient continues to smoke cigarettes.  She denied any chest pain from the surgical area and she denies ever being diagnosed to have COPD and she does not use oxygen at baseline. Patient was admitted from 9/18-9/22 due to acute respiratory failure with hypoxia secondary to COPD exacerbation and severe aortic stenosis   ED Course:  In the emergency department, she was hemodynamically stable, but patient required supplemental oxygen via [place] at 2 LPM for improved oxygenation.  Work-up in the ED showed macrocytic anemia, normal BMP, troponin x2-63 > 48, BNP 378, lactic acid 1.9.   CT angiography of chest with contrast showed no evidence of pulmonary emboli, but showed right hydropneumothorax similar to that seen on recent chest x-ray but improved from the prior exam from 05/25/2021. Chest x-ray showed a decrease in the size of right hydropneumothorax when compared with the prior exam in part due to increasing pleural effusion. She was treated with IV fentanyl, and breathing treatment with DuoNeb was provided, Solu-Medrol was given.  Hospitalist was asked to admit patient for further  evaluation and management.  Review of Systems: Constitutional: Negative for chills and fever.  HENT: Negative for ear pain and sore throat.   Eyes: Negative for pain and visual disturbance.  Respiratory: Positive for shortness of breath and cough Cardiovascular: Negative for chest pain and palpitations.  Gastrointestinal: Negative for abdominal pain and vomiting.  Endocrine: Negative for polyphagia and polyuria.  Genitourinary: Negative for decreased urine volume, dysuria, enuresis Musculoskeletal: Negative for arthralgias and back pain.  Skin: Negative for color change and rash.  Allergic/Immunologic: Negative for immunocompromised state.  Neurological: Negative for tremors, syncope, speech difficulty, weakness, light-headedness and headaches.  Hematological: Does not bruise/bleed easily.  All other systems reviewed and are negative   Past Medical History:  Diagnosis Date   Dyspnea    Headache    Heart murmur    Past Surgical History:  Procedure Laterality Date   AORTIC VALVE REPLACEMENT N/A 05/22/2021   Procedure: AORTIC VALVE REPLACEMENT (AVR) WITH STERNAL PLATING. 23 MM INSPIRIS RESILIA  AORTIC VALVE.;  Surgeon: Crystal Slot, MD;  Location: [facility] OR;  Service: Open Heart Surgery;  Laterality: N/A;   CHOLECYSTECTOMY     lt knee surgery     RIGHT/LEFT HEART CATH AND CORONARY ANGIOGRAPHY N/A 04/30/2021   Procedure: RIGHT/LEFT HEART CATH AND CORONARY ANGIOGRAPHY;  Surgeon: Crystal Lex, MD;  Location: [facility] INVASIVE CV LAB;  Service: Cardiovascular;  Laterality: N/A;   TEE WITHOUT CARDIOVERSION N/A 05/22/2021   Procedure: TRANSESOPHAGEAL ECHOCARDIOGRAM (TEE);  Surgeon: Crystal Slot, MD;  Location: [facility] OR;  Service: Open Heart Surgery;  Laterality: N/A;    Social History:  reports that she has been smoking cigarettes. She has a 40.00  pack-year smoking history. She has never used smokeless tobacco. She reports that she does not drink alcohol and does not use  drugs.   No Known Allergies  Family History  Problem Relation Age of Onset   Heart failure Mother    Heart disease Mother        stent     Prior to Admission medications   Medication Sig Start Date End Date Taking? Authorizing Provider  acetaminophen (TYLENOL) 500 MG tablet Take 500 mg by mouth every 6 (six) hours as needed for moderate pain.    [provider]  amiodarone (PACERONE) 400 MG tablet Take 1 tablet (400 mg total) by mouth every 12 (twelve) hours. For 7 day, then once daily 05/26/21   Silva, Crystal Laine, PA-C  aspirin EC 325 MG EC tablet Take 1 tablet (325 mg total) by mouth daily. 05/26/21   Silva, Crystal Portela E, PA-C  metoprolol tartrate (LOPRESSOR) 25 MG tablet Take 1 tablet (25 mg total) by mouth 2 (two) times daily. 05/28/21 08/26/21  Netta Neat., NP  midodrine (PROAMATINE) 5 MG tablet Take 1 tablet (5 mg total) by mouth 3 (three) times daily with meals. 05/26/21   Silva, Crystal E, PA-C  mometasone-formoterol (DULERA) 100-5 MCG/ACT AERO Inhale 2 puffs into the lungs 2 (two) times daily as needed for wheezing or shortness of breath.    [provider]  oxyCODONE (OXY IR/ROXICODONE) 5 MG immediate release tablet Take 1 tablet (5 mg total) by mouth every 6 (six) hours as needed for up to 7 days for severe pain. 05/26/21 06/02/21  Crystal Clack, PA-C    Physical Exam: BP 113/62   Pulse 89   Temp 97.8 F (36.6 C) (Oral)   Resp 18   Ht  (1.753 m)   Wt 78.5 kg   LMP 12/02/2013   SpO2 99%   BMI 25.56 kg/m   General: 60 y.o. year-old female well developed well nourished in no acute distress.  Alert and oriented x3. HEENT: NCAT, EOMI Neck: Supple, trachea medial Cardiovascular: Surgical scar noted in mid chest area.  Regular rate and rhythm with no rubs or gallops.  No thyromegaly or JVD noted. 2/4 pulses in all 4 extremities. Respiratory: Decreased breath sounds and mild rhonchi on auscultation with no wheezes or rales.  Abdomen: Soft, nontender  nondistended with normal bowel sounds x4 quadrants. Muskuloskeletal: Lower extremity edema bilaterally to mid shin noted.  No cyanosis or clubbing noted bilaterally Neuro: CN II-XII intact, strength 5/5 x 4, sensation, reflexes intact Skin: No ulcerative lesions noted or rashes Psychiatry: Mood is appropriate for condition and setting          Labs on Admission:  Basic Metabolic Panel: Recent Labs  Lab 05/23/21 1622 05/24/21 0353 05/25/21 0645 05/30/21 0030 05/30/21 0335  NA 137 136 136 138 137  K 3.9 4.1 4.1 3.5 3.8  CL 107 108 103 102 102  CO2 GLUCOSE 110* 103* 105* 119* 129*  BUN CREATININE 0.63 0.54 0.62 0.67 0.55  CALCIUM 7.9* 8.0* 8.3* 8.7* 8.6*  MG 2.3  --   --   --  1.8  PHOS  --   --   --   --  3.8   Liver Function Tests: Recent Labs  Lab 05/30/21 0030 05/30/21 0335  AST 16 22  ALT 15 17  ALKPHOS 38 39  BILITOT 0.6 0.5  PROT 6.1* 6.1*  ALBUMIN 3.5  3.4*   No results for input(s): LIPASE, AMYLASE in the last 168 hours. No results for input(s): AMMONIA in the last 168 hours. CBC: Recent Labs  Lab 05/23/21 1622 05/24/21 0353 05/25/21 0645 05/30/21 0030 05/30/21 0335  WBC 3.8* 3.6* 3.1* 8.1 6.4  HGB 10.4* 10.2* 9.8* 11.5* 11.3*  HCT 31.2* 31.4* 29.3* 35.8* 34.9*  MCV 99.4 103.0* 98.7 102.3* 102.6*  PLT 60* 57* 62* 217 198   Cardiac Enzymes: No results for input(s): CKTOTAL, CKMB, CKMBINDEX, TROPONINI in the last 168 hours.  BNP (last 3 results) Recent Labs    04/29/21 0007 05/30/21 0030  BNP 37.0 378.0*    ProBNP (last 3 results) No results for input(s): PROBNP in the last 8760 hours.  CBG: Recent Labs  Lab 05/25/21 1121 05/25/21 1629 05/25/21 2043 05/26/21 0604 05/26/21 1130  GLUCAP 95 110* 113* 98 137*    Radiological Exams on Admission: CT Angio Chest Pulmonary Embolism (PE) W or WO Contrast  Result Date: 05/30/2021 CLINICAL DATA:  Shortness of breath, known right hydropneumothorax. EXAM: CT  ANGIOGRAPHY CHEST WITH CONTRAST TECHNIQUE: Multidetector CT imaging of the chest was performed using the standard protocol during bolus administration of intravenous contrast. Multiplanar CT image reconstructions and MIPs were obtained to evaluate the vascular anatomy. CONTRAST:  OMNIPAQUE IOHEXOL 350 MG/ML SOLN COMPARISON:  Chest x-ray from earlier in the same day, CT from 05/01/2021. FINDINGS: Cardiovascular: Thoracic aorta demonstrates atherosclerotic calcifications without aneurysmal dilatation or dissection. Changes of prior aortic valve replacement are seen. No cardiac enlargement is noted. The pulmonary artery is well visualized within normal branching pattern. No filling defect to suggest pulmonary embolism is noted. Mediastinum/Nodes: Thoracic inlet is within normal limits. No sizable hilar or mediastinal adenopathy is noted. The esophagus is within normal limits. Lungs/Pleura: Left lung demonstrates emphysematous changes. No focal infiltrate or sizable effusion is seen. No parenchymal nodules are noted. Right lung also demonstrates emphysematous change although lower lobe atelectasis is seen as well as a right hydropneumothorax. The effusion component is moderate to large in size. Stable right upper lobe nodule measuring 9 mm is seen Upper Abdomen: Chronic atrophic changes are noted within the right lobe of the liver similar to that seen on the prior exam. Gallbladder has been surgically removed. No other focal abnormality is noted in the upper abdomen. Musculoskeletal: Bony structures show degenerative change of the thoracic spine. No rib fractures are seen. Changes of prior sternal plating are noted as well. Review of the MIP images confirms the above findings. IMPRESSION: No evidence of pulmonary emboli. Right hydropneumothorax similar to that seen on recent chest x-ray but improved from the prior exam from 05/25/2021. Stable right upper lobe nodule.  Follow-up as previously described. Chronic  changes in the right lobe of the liver Aortic Atherosclerosis (ICD10-I70.0) and Emphysema (ICD10-J43.9). Electronically Signed   By: Alcide Clever M.D.   On: 05/30/2021 02:10   DG Chest Port 1 View  Result Date: 05/30/2021 CLINICAL DATA:  Shortness of breath for 2 days EXAM: PORTABLE CHEST 1 VIEW COMPARISON:  05/25/2021 FINDINGS: Cardiac shadow is stable. Enlarging right-sided pleural effusion is noted when compare with the prior exam. The right apical pneumothorax continues to decrease with only minimal excursion superiorly. The lateral basilar component has likely filled with fluid now. No focal infiltrate is seen. Postsurgical changes are noted. IMPRESSION: Decrease in the size of right hydropneumothorax when compared with the prior exam in part due to increasing pleural effusion. Electronically Signed   By: Eulah Pont.D.  On: 05/30/2021 00:42    EKG: I independently viewed the EKG done and my findings are as followed: Junctional rhythm at a rate of 68 bpm  Assessment/Plan Present on Admission:  Shortness of breath  COPD (chronic obstructive pulmonary disease) (HCC)  Principal Problem:   Shortness of breath Active Problems:   COPD (chronic obstructive pulmonary disease) (HCC)   Aortic stenosis   S/P aortic valve replacement   Hydropneumothorax   Elevated troponin   Elevated brain natriuretic peptide (BNP) level   Shortness of breath possibly secondary to right pleural effusion Elevated BNP rule out CHF BNP 378; this was 37 a month ago Continue total input/output, daily weights and fluid restriction IV Lasix will be temporarily held at this time due to soft BP Continue Cardiac diet  Echocardiogram done last month was showed below.  Repeat echocardiogram in the morning   Questionable COPD Patient states that she has never been diagnosed to have COPD, however, she may have COPD considering her chronic use of tobacco Patient will need outpatient diagnosis of this Continue  supplemental oxygen to maintain O2 sat greater than 92% and with goal to wean patient off supplemental oxygen.  Aortic stenosis s/p aortic valve replacement Patient had valve replacement on 05/22/2021 Continue incentive spirometry Dr. Cliffton Asters of cardiothoracic surgery was consulted and recommended admitting patient here at [facility] and that they will be available for consultation if needed per ED medical record  Elevated troponin possibly secondary to type II demand ischemia Troponin x2 -63 > 48; patient denies chest pain  Right upper lobe nodule (7 mm) Stable, noncontrast chest CT at 6 to 12 months is recommended   Echocardiogram done on 04/29/2021  IMPRESSIONS    1. Left ventricular ejection fraction, by estimation, is 70 to 75%. The  left ventricle has hyperdynamic function. The left ventricle has no  regional wall motion abnormalities. Left ventricular diastolic parameters  were normal.   2. Right ventricular systolic function is normal. The right ventricular  size is normal. There is mildly elevated pulmonary artery systolic  pressure.   3. The mitral valve is normal in structure. No evidence of mitral valve  regurgitation. No evidence of mitral stenosis.   4. The aortic valve was not well visualized. There is moderate  calcification of the aortic valve. There is moderate thickening of the  aortic valve. Aortic valve regurgitation is not visualized. Severe aortic  valve stenosis. Aortic valve mean gradient  measures 40.2 mmHg. Aortic valve peak gradient measures 64.0 mmHg. Aortic  valve area, by VTI measures 0.84 cm.      DI 0.27   5. The inferior vena cava is normal in size with greater than 50%  respiratory variability, suggesting right atrial pressure of 3 mmHg.    DVT prophylaxis: Lovenox  Code Status: Full code  Family Communication: None at bedside  Disposition Plan:  Patient is from:                        home Anticipated DC to:                   SNF or family  members home Anticipated DC date:               2-3 days Anticipated DC barriers:         Patient requires inpatient management due to shortness of breath secondary to pleural effusion  Consults called: None  Admission status: Observation    Takima Encina  Thomes Dinning MD [facility]  05/30/2021, 5:05 AM

## 2021-05-30 NOTE — ED Notes (Signed)
Patient transported to CT 

## 2021-05-30 NOTE — ED Notes (Addendum)
Date and time results received: 05/30/21 1:02 AM   Test: Venous Blood Gas Critical Value: < 31.0  Name of Provider Notified: Dr. Pilar Plate  Orders Received? Or Actions Taken?: Acknowledged

## 2021-05-30 NOTE — ED Triage Notes (Signed)
Pt brought in by family c/o shortness of breath. Pt recently had open heart surgery with Aortic value replacement. Pt arrives with audible rhonchi.

## 2021-05-30 NOTE — ED Notes (Signed)
Attempted to call report x 1  

## 2021-05-30 NOTE — ED Provider Notes (Signed)
[facility] Emergency Department Provider Note MRN:  [id number]  Arrival date & time: 05/30/21     Chief Complaint   Shortness of Breath   History of Present Illness   [facility] is a 60 y.o. year-old female with a history of COPD, aortic stenosis status post valve replacement presenting to the ED with chief complaint of shortness of breath.  Patient is postop day 7 from aortic valve replacement.  Has been having some gradual shortness of breath over the past several days, much worse this evening.  Has also noticed some swelling in her feet and ankles which is abnormal for her.  Some slight increased cough but denies fever, no headache or vision change.  Endorsing some right shoulder pain which is new for her.  Mild to moderate in severity.  No abdominal pain.  Trouble laying flat.  Symptoms constant, moderate to severe, no other exacerbating or alleviating factors.  Review of Systems  A complete 10 system review of systems was obtained and all systems are negative except as noted in the HPI and PMH.   Patient's Health History    Past Medical History:  Diagnosis Date   Dyspnea    Headache    Heart murmur     Past Surgical History:  Procedure Laterality Date   AORTIC VALVE REPLACEMENT N/A 05/22/2021   Procedure: AORTIC VALVE REPLACEMENT (AVR) WITH STERNAL PLATING. 23 MM INSPIRIS RESILIA  AORTIC VALVE.;  Surgeon: [name], MD;  Location: [facility] OR;  Service: Open Heart Surgery;  Laterality: N/A;   CHOLECYSTECTOMY     lt knee surgery     RIGHT/LEFT HEART CATH AND CORONARY ANGIOGRAPHY N/A 04/30/2021   Procedure: RIGHT/LEFT HEART CATH AND CORONARY ANGIOGRAPHY;  Surgeon: Marykay Lex, MD;  Location: [facility] INVASIVE CV LAB;  Service: Cardiovascular;  Laterality: N/A;   TEE WITHOUT CARDIOVERSION N/A 05/22/2021   Procedure: TRANSESOPHAGEAL ECHOCARDIOGRAM (TEE);  Surgeon: [name], MD;  Location: [facility] OR;  Service: Open Heart Surgery;   Laterality: N/A;    Family History  Problem Relation Age of Onset   Heart failure Mother    Heart disease Mother        stent    Social History   Socioeconomic History   Marital status: Divorced    Spouse name: Not on file   Number of children: Not on file   Years of education: Not on file   Highest education level: Not on file  Occupational History   Not on file  Tobacco Use   Smoking status: Every Day    Packs/day: 1.00    Years: 40.00    Pack years: 40.00    Types: Cigarettes   Smokeless tobacco: Never  Substance and Sexual Activity   Alcohol use: No   Drug use: No   Sexual activity: Not on file  Other Topics Concern   Not on file  Social History Narrative   Lives with daughter and grandson   Social Determinants of Health   Financial Resource Strain: Not on file  Food Insecurity: Not on file  Transportation Needs: Not on file  Physical Activity: Not on file  Stress: Not on file  Social Connections: Not on file  Intimate Partner Violence: Not on file     Physical Exam   Vitals:   05/30/21 0135 05/30/21 0200  BP: 99/80 132/63  Pulse: 66 92  Resp: 18 20  Temp:    SpO2: 98% 99%    CONSTITUTIONAL: Chronically ill-appearing,  in moderate respiratory distress NEURO:  Alert and oriented x 3, no focal deficits EYES:  eyes equal and reactive ENT/NECK:  no LAD, no JVD CARDIO: Regular rate, well-perfused, normal S1 and S2 PULM: Tachypneic, pursed lip breathing, sitting upright, scattered rhonchi throughout lung fields GI/GU:  normal bowel sounds, non-distended, non-tender MSK/SPINE:  No gross deformities, no edema SKIN:  no rash, atraumatic PSYCH:  Appropriate speech and behavior  *Additional and/or pertinent findings included in MDM below  Diagnostic and Interventional Summary    EKG Interpretation  Date/Time:  Thursday May 30 2021 00:25:49 EDT Ventricular Rate:  68 PR Interval:    QRS Duration: 92 QT Interval:  450 QTC Calculation: 479 R  Axis:   1 Text Interpretation: Junctional rhythm Low voltage, precordial leads Nonspecific T abnormalities, lateral leads Confirmed by Kennis Carina [phone]) on 05/30/2021 12:50:52 AM       Labs Reviewed  CBC - Abnormal; Notable for the following components:      Result Value   RBC 3.50 (*)    Hemoglobin 11.5 (*)    HCT 35.8 (*)    MCV 102.3 (*)    All other components within normal limits  COMPREHENSIVE METABOLIC PANEL - Abnormal; Notable for the following components:   Glucose, Bld 119 (*)    Calcium 8.7 (*)    Total Protein 6.1 (*)    All other components within normal limits  BLOOD GAS, VENOUS - Abnormal; Notable for the following components:   pO2, Ven <31.0 (*)    Bicarbonate 28.1 (*)    Acid-Base Excess 6.3 (*)    All other components within normal limits  BRAIN NATRIURETIC PEPTIDE - Abnormal; Notable for the following components:   B Natriuretic Peptide 378.0 (*)    All other components within normal limits  TROPONIN I (HIGH SENSITIVITY) - Abnormal; Notable for the following components:   Troponin I (High Sensitivity) 63 (*)    All other components within normal limits  RESP PANEL BY RT-PCR (FLU A&B, COVID) ARPGX2  LACTIC ACID, PLASMA  PROTIME-INR  APTT  TROPONIN I (HIGH SENSITIVITY)    CT Angio Chest Pulmonary Embolism (PE) W or WO Contrast  Final Result    DG Chest Port 1 View  Final Result      Medications  ipratropium-albuterol (DUONEB) 0.5-2.5 (3) MG/3ML nebulizer solution 3 mL (has no administration in time range)  iohexol (OMNIPAQUE) 350 MG/ML injection 100 mL (100 mLs Intravenous Contrast Given 05/30/21 0143)  methylPREDNISolone sodium succinate (SOLU-MEDROL) 125 mg/2 mL injection 125 mg (125 mg Intravenous Given 05/30/21 0233)  fentaNYL (SUBLIMAZE) injection 50 mcg (50 mcg Intravenous Given 05/30/21 0238)     Procedures  /  Critical Care Procedures  ED Course and Medical Decision Making  I have reviewed the triage vital signs, the nursing notes,  and pertinent available records from the EMR.  Listed above are laboratory and imaging tests that I personally ordered, reviewed, and interpreted and then considered in my medical decision making (see below for details).  Differential diagnosis includes pulmonary edema, valvular heart failure, PE, postop pneumonia, pneumothorax, chest x-ray with question of lobar infiltrate on the right, will likely need CT imaging to further delineate, vital signs are actually reassuring hemodynamically, providing oxygen for comfort and will monitor closely.     Work-up overall reassuring.  Troponin minimally elevated at 63, BNP minimally elevated at 300.  Vital signs and respiratory rate much improved with 2 L of oxygen nasal cannula.  CTA is without evidence of PE.  There is a hydropneumothorax on the right that is largely unchanged from imaging 5 days ago.  There is no significant pulmonary edema or any other acute process such as pneumonia.  Diffuse evidence of emphysema.  Perhaps there is a component of COPD exacerbation at play, and it is possible that this hydropneumothorax is limiting her reserve.  We will provide steroids, breathing treatment.  Discussed case with Dr. Cliffton Asters of cardiothoracic surgery, patient seems appropriate for hospitalist admission here at [facility] and they will be available for consultation if needed.  Elmer Sow. Pilar Plate, MD [facility] Emergency Medicine [facility] [email or web address]  Final Clinical Impressions(s) / ED Diagnoses     ICD-10-CM   1. SOB (shortness of breath)  R06.02       ED Discharge Orders     None        Discharge Instructions Discussed with and Provided to Patient:   Discharge Instructions   None       Sabas Sous, MD 05/30/21 [phone]

## 2021-05-30 NOTE — ED Notes (Signed)
Pt ambulated to restroom and around nurses station without assistance on room air. Pt had increased SOB when walking, O2 94% on room air.

## 2021-05-30 NOTE — Progress Notes (Signed)
Cardiology Office Note  Date: 05/31/2021   ID: Crystal Silva, DOB 06-06-1961, MRN [id number]  PCP:  Patient, No Pcp Per (Inactive)  Cardiologist:  Dina Rich, MD Electrophysiologist:  None   Chief Complaint: Hospital follow-up  History of Present Illness: Crystal Silva is a 60 y.o. female with a history of aortic valve replacement Dr. Dorris Fetch, tobacco abuse, COPD.  She recently had aortic valve replacement and was doing well up until yesterday.  She had been having issues with worsening shortness of breath.  On telephone encounter on 05/29/2021 she called reporting worsening shortness of breath since hospital discharge.  Nursing staff reported she did not sound short of breath on the phone.  She was continuing to smoke.  She reported heart rate elevated with little activity.  Heart rate was 92 and was the highest reading when walking to the bathroom.  She denied any chest pain.  She reported some lightheadedness.  Her blood pressure was 156/86 with a heart rate of 71.  She was encouraged to go to the emergency room if symptoms became worse.  She reported to the emergency room yesterday evening.  ER notes state she had a 2-day history of worsening shortness of breath and leg swelling.  She was monitored in the hospital with shortness of breath and hypoxia.  Postop aortic valve replacement she underwent CTA with no evidence of complications.  She had a small right pleural effusion which was improving since surgery.  2D echocardiogram with no evidence of valvular complications.  Normal ejection fraction.  ER physician communicated with CT surgeon and Dr. Cliffton Asters recommended symptomatic treatment.  She was admitted for observation.  She was treated with incentive spirometry, deep breathing exercises, low-dose steroids and mucolytic's with good clinical response she was asymptomatic the following day on room air and mobilized.  She was discharged home.  She was to continue metoprolol and  tapering dose of amiodarone and aspirin from postop orders..  She was to continue using incentive spirometry at home and smoking cessation was discussed   Past Medical History:  Diagnosis Date   Dyspnea    Headache    Heart murmur     Past Surgical History:  Procedure Laterality Date   AORTIC VALVE REPLACEMENT N/A 05/22/2021   Procedure: AORTIC VALVE REPLACEMENT (AVR) WITH STERNAL PLATING. 23 MM INSPIRIS RESILIA  AORTIC VALVE.;  Surgeon: Crystal Slot, MD;  Location: [facility] OR;  Service: Open Heart Surgery;  Laterality: N/A;   CHOLECYSTECTOMY     lt knee surgery     RIGHT/LEFT HEART CATH AND CORONARY ANGIOGRAPHY N/A 04/30/2021   Procedure: RIGHT/LEFT HEART CATH AND CORONARY ANGIOGRAPHY;  Surgeon: Crystal Lex, MD;  Location: [facility] INVASIVE CV LAB;  Service: Cardiovascular;  Laterality: N/A;   TEE WITHOUT CARDIOVERSION N/A 05/22/2021   Procedure: TRANSESOPHAGEAL ECHOCARDIOGRAM (TEE);  Surgeon: Crystal Slot, MD;  Location: [facility] OR;  Service: Open Heart Surgery;  Laterality: N/A;    Current Outpatient Medications  Medication Sig Dispense Refill   acetaminophen (TYLENOL) 500 MG tablet Take 500 mg by mouth every 6 (six) hours as needed for moderate pain.     albuterol (VENTOLIN HFA) 108 (90 Base) MCG/ACT inhaler Inhale 2 puffs into the lungs every 6 (six) hours as needed for wheezing or shortness of breath. 8 g 2   amiodarone (PACERONE) 400 MG tablet Take 1 tablet (400 mg total) by mouth every 12 (twelve) hours. For 7 day, then once daily 37 tablet 1   aspirin  EC 325 MG EC tablet Take 1 tablet (325 mg total) by mouth daily.     metoprolol tartrate (LOPRESSOR) 25 MG tablet Take 1 tablet (25 mg total) by mouth 2 (two) times daily. 180 tablet 3   midodrine (PROAMATINE) 5 MG tablet Take 1 tablet (5 mg total) by mouth 3 (three) times daily with meals. 90 tablet 0   mometasone-formoterol (DULERA) 100-5 MCG/ACT AERO Inhale 2 puffs into the lungs 2 (two) times daily as needed for wheezing  or shortness of breath.     oxyCODONE (OXY IR/ROXICODONE) 5 MG immediate release tablet Take 1 tablet (5 mg total) by mouth every 6 (six) hours as needed for up to 7 days for severe pain. 28 tablet 0   [START ON 06/01/2021] predniSONE (DELTASONE) 20 MG tablet Take 2 tablets (40 mg total) by mouth daily with breakfast for 5 days. 10 tablet 0   No current facility-administered medications for this visit.   Allergies:  Patient has no known allergies.   Social History: The patient  reports that she has been smoking cigarettes. She has a 40.00 pack-year smoking history. She has never used smokeless tobacco. She reports that she does not drink alcohol and does not use drugs.   Family History: The patient's family history includes Heart disease in her mother; Heart failure in her mother.   ROS:  Please see the history of present illness. Otherwise, complete review of systems is positive for none.  All other systems are reviewed and negative.   Physical Exam: VS:  BP 116/74   Pulse 71   Ht 5\' 9"  (1.753 m)   Wt 168 lb 6.4 oz (76.4 kg)   LMP 12/02/2013   SpO2 96%   BMI 24.87 kg/m , BMI Body mass index is 24.87 kg/m.  Wt Readings from Last 3 Encounters:  05/31/21 168 lb 6.4 oz (76.4 kg)  05/31/21 169 lb (76.7 kg)  05/26/21 173 lb 1 oz (78.5 kg)    General: Patient appears comfortable at rest. Neck: Supple, no elevated JVP or carotid bruits, no thyromegaly. Lungs: Clear to auscultation, nonlabored breathing at rest. Cardiac: Regular rate and rhythm, no S3 or significant systolic murmur, no pericardial rub. Extremities: No pitting edema, distal pulses 2+. Skin: Warm and dry. Musculoskeletal: No kyphosis. Neuropsychiatric: Alert and oriented x3, affect grossly appropriate.  ECG:    Recent Labwork: 05/24/2021: TSH 2.323 05/30/2021: ALT 17; AST 22; B Natriuretic Peptide 378.0; BUN 14; Creatinine, Ser 0.55; Hemoglobin 11.3; Magnesium 1.8; Platelets 198; Potassium 3.8; Sodium 137  No  results found for: CHOL, TRIG, HDL, CHOLHDL, VLDL, LDLCALC, LDLDIRECT  Other Studies Reviewed Today:   Chest x-ray 05/30/2021 IMPRESSION: Decrease in the size of right hydropneumothorax when compared with the prior exam in part due to increasing pleural effusion.     CT chest 05/30/2021 IMPRESSION: No evidence of pulmonary emboli.   Right hydropneumothorax similar to that seen on recent chest x-ray but improved from the prior exam from 05/25/2021.   Stable right upper lobe nodule.  Follow-up as previously described.   Chronic changes in the right lobe of the liver   Aortic Atherosclerosis (ICD10-I70.0) and Emphysema (ICD10-J43.9).    Echocardiogram 05/30/2021  1. Limited study.   2. Left ventricular ejection fraction, by estimation, is 60 to 65%. The  left ventricle has normal function. There is mild left ventricular  hypertrophy.   3. Right ventricular systolic function is normal. The right ventricular  size is normal.   4. The mitral valve is  grossly normal.   5. Tricuspid valve regurgitation is mild to moderate.   6. The aortic valve has been repaired/replaced. Aortic valve  regurgitation is not visualized. There is a 23 mm Inspiris Resilia valve  present in the aortic position. No obvious paravaluvlar regurgitation.  Valve was not completely interrogated.   Comparison(s): Prior images reviewed side by side. Now status post  bioprosthetic AVR.   RIGHT/LEFT HEART CATH AND CORONARY ANGIOGRAPHY  05/02/2021 Conclusion      LV end diastolic pressure is moderately elevated.   Angiographically normal coronary arteries   Moderate Aortic Stenosis by Cath Gradient, Severe Aortic Stenosis by Echo   SUMMARY Angiographically normal coronary arteries. Moderate Aortic Stenosis by cardiac cath, Severe Aortic Stenosis  by Echo. Moderately elevated LVEDP and PCWP with normal PAP.     RECOMMENDATIONS Return to nursing unit for ongoing care. Anticipate TAVR team  consultation. Diagnostic Dominance: Right      Assessment and Plan:  1. S/P aortic valve replacement   2. Tobacco abuse   3. Chronic obstructive pulmonary disease, unspecified COPD type (HCC)    1. S/P aortic valve replacement She is status post aortic valve replacement on 05/22/2021 Dr. Dorris Fetch.  Yesterday she reported to the emergency room secondary to with complaints of worsening shortness of breath over the prior 2 days with leg swelling.  She had a CTA with no evidence of complications.  She had a small right hydropneumothorax which was improving since surgery.  Echocardiogram with no evidence of valvular complications.  She had a normal ejection fraction.  She was discharged the following day.  2. Tobacco abuse She continues to smoke postop.  She states she was getting short of breath with activity.  She has a very long history of smoking.  She states she is probably smoked more than 40 years.  She has never had an evaluation by pulmonary.  We discussed referral for evaluation for shortness of breath.  She wants to defer for now until she recovers from surgery.  Highly advised cessation of smoking.  3. Chronic obstructive pulmonary disease, unspecified COPD type (HCC) Significant history of smoking.  She has chronic dyspnea.  She was started on a short course of prednisone at 40 mg daily x5 days.  4.  Atrial fibrillation She had atrial fibrillation postop and was started on amiodarone loading dose 400 mg by mouth every 12 for 7 days then once daily.  She was started on 325 mg aspirin.  Also on Lopressor 25 mg p.o. twice daily.   Medication Adjustments/Labs and Tests Ordered: Current medicines are reviewed at length with the patient today.  Concerns regarding medicines are outlined above.   Disposition: Follow-up with Dr. Wyline Mood or APP 6 months  Signed, Rennis Harding, NP 05/31/2021 4:54 PM    [facility] [address] [place], [place], [place]  [address] Phone: [phone]; Fax: [phone]

## 2021-05-30 NOTE — Progress Notes (Signed)
Patient seen and examined.  Admitted early morning hours by nighttime hospitalist.  60 year old female with history of severe aortic stenosis status post bioprosthetic valve replacement on 10/12 and discharged on 10/15 comes back to the emergency room with 2-day of worsening shortness of breath and leg swelling.  She is a smoker.  Shortness of breath and hypoxia in an immediate postop aortic valve replacement.  CTA with no evidence of complications, small right pleural effusion which is improving since surgery. 2D echocardiogram with no evidence of valvular complications, normal ejection fraction. Shortness of breath is probably multifactorial including postoperative atelectasis, right-sided effusion, pain and less mobility.  ER physician communicated with CT surgery and they recommended symptomatic treatment.  Plan: Continue incentive spirometry, deep breathing exercises, nebulizers.  Low-dose steroids.  Mucolytic's. Monitor in the hospital due to significant symptoms, no surgical intervention anticipated. Right pleural effusion is postop and expected findings, already looking better than previous. If remains stable and able to get back on room air, anticipate early morning discharge and follow-up with her CT surgeon. Smoking cessation counseling done. Patient was discharged on metoprolol, tapering dose of amiodarone and aspirin that is restarted.  Charge: No charge visit.  Same-day admission.

## 2021-05-30 NOTE — ED Notes (Signed)
Echo at bedside

## 2021-05-31 ENCOUNTER — Encounter: Payer: Self-pay | Admitting: Family Medicine

## 2021-05-31 ENCOUNTER — Ambulatory Visit (INDEPENDENT_AMBULATORY_CARE_PROVIDER_SITE_OTHER): Payer: BC Managed Care – PPO | Admitting: Family Medicine

## 2021-05-31 VITALS — BP 116/74 | HR 71 | Ht 69.0 in | Wt 168.4 lb

## 2021-05-31 DIAGNOSIS — Z720 Tobacco use: Secondary | ICD-10-CM | POA: Diagnosis not present

## 2021-05-31 DIAGNOSIS — Z952 Presence of prosthetic heart valve: Secondary | ICD-10-CM

## 2021-05-31 DIAGNOSIS — R0602 Shortness of breath: Secondary | ICD-10-CM | POA: Diagnosis not present

## 2021-05-31 DIAGNOSIS — J449 Chronic obstructive pulmonary disease, unspecified: Secondary | ICD-10-CM | POA: Diagnosis not present

## 2021-05-31 MED ORDER — ALBUTEROL SULFATE HFA 108 (90 BASE) MCG/ACT IN AERS: 2.0000 | INHALATION_SPRAY | Freq: Four times a day (QID) | RESPIRATORY_TRACT | 2 refills | Status: DC | PRN

## 2021-05-31 MED ORDER — PREDNISONE 20 MG PO TABS: 40.0000 mg | ORAL_TABLET | Freq: Every day | ORAL | 0 refills | Status: DC

## 2021-05-31 NOTE — Patient Instructions (Signed)

## 2021-05-31 NOTE — Plan of Care (Signed)
  Problem: Education: Goal: Knowledge of General Education information will improve Description: Including pain rating scale, medication(s)/side effects and non-pharmacologic comfort measures 05/31/2021 0852 by Arnoldo Hooker, RN Outcome: Adequate for Discharge 05/31/2021 0851 by Arnoldo Hooker, RN Outcome: Adequate for Discharge   Problem: Health Behavior/Discharge Planning: Goal: Ability to manage health-related needs will improve 05/31/2021 0852 by Arnoldo Hooker, RN Outcome: Adequate for Discharge 05/31/2021 0851 by Arnoldo Hooker, RN Outcome: Adequate for Discharge   Problem: Clinical Measurements: Goal: Ability to maintain clinical measurements within normal limits will improve 05/31/2021 0852 by Arnoldo Hooker, RN Outcome: Adequate for Discharge 05/31/2021 0851 by Arnoldo Hooker, RN Outcome: Adequate for Discharge Goal: Will remain free from infection 05/31/2021 0852 by Arnoldo Hooker, RN Outcome: Adequate for Discharge 05/31/2021 0851 by Arnoldo Hooker, RN Outcome: Adequate for Discharge Goal: Diagnostic test results will improve 05/31/2021 0852 by Arnoldo Hooker, RN Outcome: Adequate for Discharge 05/31/2021 0851 by Arnoldo Hooker, RN Outcome: Adequate for Discharge Goal: Respiratory complications will improve 05/31/2021 0852 by Arnoldo Hooker, RN Outcome: Adequate for Discharge 05/31/2021 0851 by Arnoldo Hooker, RN Outcome: Adequate for Discharge Goal: Cardiovascular complication will be avoided 05/31/2021 0852 by Arnoldo Hooker, RN Outcome: Adequate for Discharge 05/31/2021 0851 by Arnoldo Hooker, RN Outcome: Adequate for Discharge   Problem: Activity: Goal: Risk for activity intolerance will decrease 05/31/2021 0852 by Arnoldo Hooker, RN Outcome: Adequate for Discharge 05/31/2021 0851 by Arnoldo Hooker, RN Outcome: Adequate for Discharge   Problem: Nutrition: Goal: Adequate nutrition will be  maintained 05/31/2021 0852 by Arnoldo Hooker, RN Outcome: Adequate for Discharge 05/31/2021 0851 by Arnoldo Hooker, RN Outcome: Adequate for Discharge   Problem: Coping: Goal: Level of anxiety will decrease 05/31/2021 0852 by Arnoldo Hooker, RN Outcome: Adequate for Discharge 05/31/2021 0851 by Arnoldo Hooker, RN Outcome: Adequate for Discharge   Problem: Elimination: Goal: Will not experience complications related to bowel motility 05/31/2021 0852 by Arnoldo Hooker, RN Outcome: Adequate for Discharge 05/31/2021 0851 by Arnoldo Hooker, RN Outcome: Adequate for Discharge Goal: Will not experience complications related to urinary retention 05/31/2021 0852 by Arnoldo Hooker, RN Outcome: Adequate for Discharge 05/31/2021 0851 by Arnoldo Hooker, RN Outcome: Adequate for Discharge   Problem: Pain Managment: Goal: General experience of comfort will improve 05/31/2021 0852 by Arnoldo Hooker, RN Outcome: Adequate for Discharge 05/31/2021 0851 by Arnoldo Hooker, RN Outcome: Adequate for Discharge   Problem: Safety: Goal: Ability to remain free from injury will improve 05/31/2021 0852 by Arnoldo Hooker, RN Outcome: Adequate for Discharge 05/31/2021 0851 by Arnoldo Hooker, RN Outcome: Adequate for Discharge   Problem: Skin Integrity: Goal: Risk for impaired skin integrity will decrease 05/31/2021 0852 by Arnoldo Hooker, RN Outcome: Adequate for Discharge 05/31/2021 0851 by Arnoldo Hooker, RN Outcome: Adequate for Discharge

## 2021-05-31 NOTE — Discharge Summary (Signed)
Physician Discharge Summary  Crystal Silva:[id number] DOB: 06/05/1961 DOA: 05/30/2021  PCP: Crystal Silva, No Pcp Per (Inactive)  Admit date: 05/30/2021 Discharge date: 05/31/2021  Admitted From: Home Disposition: Home  Recommendations for Outpatient Follow-up:  Follow up with PCP in 1-2 weeks Follow-up with cardiology and cardiothoracic surgery as a scheduled  Home Health: N/A Equipment/Devices: N/A  Discharge Condition: Stable CODE STATUS: Full code Diet recommendation: Regular diet  Discharge summary: 60 year old female with history of severe aortic stenosis status post bioprosthetic valve replacement on 10/12 and discharged on 10/15 came back to the emergency room with 2-day of worsening shortness of breath and leg swelling.  She is a smoker.  Crystal Silva was monitored in the hospital with Shortness of breath and hypoxia in an immediate postop aortic valve replacement and underwent investigations including following.   - CTA with no evidence of complications, small right pleural effusion which is improving since surgery. - 2D echocardiogram with no evidence of valvular complications, normal ejection fraction. Shortness of breath is probably multifactorial including postoperative atelectasis, right-sided effusion, pain and less mobility.  ER physician communicated with CT surgery, Dr. Cliffton Asters and recommended symptomatic treatment.   assessment and plan:  Admitted for observation.  Treated with incentive spirometry, deep breathing exercises, low-dose steroids and mucolytic's with good clinical response.  Asymptomatic today.  On room air and mobilized around. Discharge home with follow-up with surgery. She is to continue metoprolol, tapering dose of amiodarone and aspirin from postop orders. She can use over-the-counter mucolytic's, she will continue to use incentive spirometry at home. Smoking cessation discussed and Crystal Silva is motivated.    Discharge Diagnoses:  Principal  Problem:   Shortness of breath Active Problems:   COPD (chronic obstructive pulmonary disease) (HCC)   Aortic stenosis   S/P aortic valve replacement   Hydropneumothorax   Elevated troponin   Elevated brain natriuretic peptide (BNP) level    Discharge Instructions  Discharge Instructions     Call MD for:  difficulty breathing, headache or visual disturbances   Complete by: As directed    Call MD for:  severe uncontrolled pain   Complete by: As directed    Diet general   Complete by: As directed    Discharge instructions   Complete by: As directed    Keep doing lung exercises at home , stop smoking   Increase activity slowly   Complete by: As directed    No wound care   Complete by: As directed       Allergies as of 05/31/2021   No Known Allergies      Medication List     TAKE these medications    acetaminophen 500 MG tablet Commonly known as: TYLENOL Take 500 mg by mouth every 6 (six) hours as needed for moderate pain.   albuterol 108 (90 Base) MCG/ACT inhaler Commonly known as: VENTOLIN HFA Inhale 2 puffs into the lungs every 6 (six) hours as needed for wheezing or shortness of breath.   amiodarone 400 MG tablet Commonly known as: PACERONE Take 1 tablet (400 mg total) by mouth every 12 (twelve) hours. For 7 day, then once daily   aspirin 325 MG EC tablet Take 1 tablet (325 mg total) by mouth daily.   metoprolol tartrate 25 MG tablet Commonly known as: LOPRESSOR Take 1 tablet (25 mg total) by mouth 2 (two) times daily.   midodrine 5 MG tablet Commonly known as: PROAMATINE Take 1 tablet (5 mg total) by mouth 3 (three) times daily with meals.  mometasone-formoterol 100-5 MCG/ACT Aero Commonly known as: DULERA Inhale 2 puffs into the lungs 2 (two) times daily as needed for wheezing or shortness of breath.   oxyCODONE 5 MG immediate release tablet Commonly known as: Oxy IR/ROXICODONE Take 1 tablet (5 mg total) by mouth every 6 (six) hours as needed  for up to 7 days for severe pain.   predniSONE 20 MG tablet Commonly known as: DELTASONE Take 2 tablets (40 mg total) by mouth daily with breakfast for 5 days. Start taking on: June 01, 2021        No Known Allergies  Consultations: CT surgery, Dr. Cliffton Asters curbside by ER physician.   Procedures/Studies: DG Orthopantogram  Result Date: 05/01/2021 CLINICAL DATA:  Preoperative evaluation prior to heart surgery. EXAM: ORTHOPANTOGRAM/PANORAMIC COMPARISON:  None. FINDINGS: Maxilla is completely edentulous without evidence of retained fragment. Mandible shows residual teeth numbers 21 through 26. No evidence of dental decay or periodontal disease. No residual root fragments elsewhere. IMPRESSION: Edentulous with the exception of mandibular teeth 21 through 26. No evidence of dental decay or periodontal disease. Electronically Signed   By: Paulina Fusi M.D.   On: 05/01/2021 13:48   DG Chest 2 View  Result Date: 05/25/2021 CLINICAL DATA:  Chest pain.  Evaluate apical pneumothorax EXAM: CHEST - 2 VIEW COMPARISON:  Yesterday FINDINGS: Prior median sternotomy. Midline trachea. Normal heart size. Right-sided hydropneumothorax with small volume pleural fluid and moderate volume pleural air. Visceral pleural line 3.5 cm from chest wall today versus 4.6 cm previously. Estimated at 20%. Trace left pleural fluid.  Right base atelectasis. IMPRESSION: Slight decrease in size of right-sided hydropneumothorax, as detailed above. Trace left pleural fluid. Electronically Signed   By: Jeronimo Greaves M.D.   On: 05/25/2021 08:18   DG Chest 2 View  Result Date: 05/22/2021 CLINICAL DATA:  Pre-op/severe aortic stenosis by prior echocardiogram. History of heart murmur EXAM: CHEST - 2 VIEW COMPARISON:  CT Angio Chest 05/01/2021; CT Angio Chest 04/29/2021. Chest two-view 04/28/2021 FINDINGS: Pulmonary hyperinflation. Lungs clear without infiltrate or effusion. Heart size and vascularity normal. IMPRESSION: No active  cardiopulmonary disease. Electronically Signed   By: Marlan Palau M.D.   On: 05/22/2021 13:57   CT Angio Chest Pulmonary Embolism (PE) W or WO Contrast  Result Date: 05/30/2021 CLINICAL DATA:  Shortness of breath, known right hydropneumothorax. EXAM: CT ANGIOGRAPHY CHEST WITH CONTRAST TECHNIQUE: Multidetector CT imaging of the chest was performed using the standard protocol during bolus administration of intravenous contrast. Multiplanar CT image reconstructions and MIPs were obtained to evaluate the vascular anatomy. CONTRAST:  OMNIPAQUE IOHEXOL 350 MG/ML SOLN COMPARISON:  Chest x-ray from earlier in the same day, CT from 05/01/2021. FINDINGS: Cardiovascular: Thoracic aorta demonstrates atherosclerotic calcifications without aneurysmal dilatation or dissection. Changes of prior aortic valve replacement are seen. No cardiac enlargement is noted. The pulmonary artery is well visualized within normal branching pattern. No filling defect to suggest pulmonary embolism is noted. Mediastinum/Nodes: Thoracic inlet is within normal limits. No sizable hilar or mediastinal adenopathy is noted. The esophagus is within normal limits. Lungs/Pleura: Left lung demonstrates emphysematous changes. No focal infiltrate or sizable effusion is seen. No parenchymal nodules are noted. Right lung also demonstrates emphysematous change although lower lobe atelectasis is seen as well as a right hydropneumothorax. The effusion component is moderate to large in size. Stable right upper lobe nodule measuring 9 mm is seen Upper Abdomen: Chronic atrophic changes are noted within the right lobe of the liver similar to that seen on the  prior exam. Gallbladder has been surgically removed. No other focal abnormality is noted in the upper abdomen. Musculoskeletal: Bony structures show degenerative change of the thoracic spine. No rib fractures are seen. Changes of prior sternal plating are noted as well. Review of the MIP images confirms  the above findings. IMPRESSION: No evidence of pulmonary emboli. Right hydropneumothorax similar to that seen on recent chest x-ray but improved from the prior exam from 05/25/2021. Stable right upper lobe nodule.  Follow-up as previously described. Chronic changes in the right lobe of the liver Aortic Atherosclerosis (ICD10-I70.0) and Emphysema (ICD10-J43.9). Electronically Signed   By: Alcide Clever M.D.   On: 05/30/2021 02:10   DG Chest 1V REPEAT Same Day  Result Date: 05/23/2021 CLINICAL DATA:  Right-sided pneumothorax. EXAM: CHEST - 1 VIEW SAME DAY COMPARISON:  Earlier same day FINDINGS: 1205 hours. Mild interval progression of the right sided pneumothorax. Thickness of pleural gas in the right apex measured previously at 3.7 cm is now 4.8 cm. There is more prominent pleural gas at the right base. Left lung remains clear. The cardiopericardial silhouette is within normal limits for size. Right IJ pulmonary catheter is been removed in the interval with right IJ sheath visualized in site. Midline mediastinal/pericardial drain remains in place. IMPRESSION: 1. Mild interval progression of right-sided pneumothorax. 2. Interval removal of pulmonary catheter. Electronically Signed   By: Kennith Center M.D.   On: 05/23/2021 14:57   DG Chest Port 1 View  Result Date: 05/30/2021 CLINICAL DATA:  Shortness of breath for 2 days EXAM: PORTABLE CHEST 1 VIEW COMPARISON:  05/25/2021 FINDINGS: Cardiac shadow is stable. Enlarging right-sided pleural effusion is noted when compare with the prior exam. The right apical pneumothorax continues to decrease with only minimal excursion superiorly. The lateral basilar component has likely filled with fluid now. No focal infiltrate is seen. Postsurgical changes are noted. IMPRESSION: Decrease in the size of right hydropneumothorax when compared with the prior exam in part due to increasing pleural effusion. Electronically Signed   By: Alcide Clever M.D.   On: 05/30/2021 00:42    DG Chest Port 1 View  Result Date: 05/24/2021 CLINICAL DATA:  Status post cardiac surgery. EXAM: PORTABLE CHEST 1 VIEW COMPARISON:  May 23, 2021. FINDINGS: The heart size and mediastinal contours are within normal limits. Status post aortic valve repair. Right internal jugular venous sheath is unchanged in position. Stable moderate size right apical pneumothorax is noted. Left lung is clear. Sternotomy wires are noted. The visualized skeletal structures are unremarkable. IMPRESSION: Stable moderate size right apical pneumothorax. Electronically Signed   By: Lupita Raider M.D.   On: 05/24/2021 08:52   DG Chest Port 1 View  Result Date: 05/23/2021 CLINICAL DATA:  Status post CABG EXAM: PORTABLE CHEST 1 VIEW COMPARISON:  Status post aortic valve replacement FINDINGS: Status post median sternotomy and aortic valve replacement. Pulmonary arterial catheter tip is in the right ventricular outflow tract. There is a mediastinal drain in place. The endotracheal tube and nasogastric tubes have been removed. New right apical pneumothorax is identified. This is moderate in size measuring 3.7 cm over the right apex. Pulmonary vascular congestion identified. No pleural effusion or airspace consolidation. IMPRESSION: 1. New right apical pneumothorax. Moderate in size measuring 3.7 cm over the apex. 2. Pulmonary vascular congestion. 3. Status post CABG and aortic valve replacement. 4. Critical Value/emergent results were called by telephone at the time of interpretation on 05/23/2021 at 8:52 am to provider [facility] , who verbally acknowledged  these results. Electronically Signed   By: Signa Kell M.D.   On: 05/23/2021 08:52   DG Chest Port 1 View  Result Date: 05/22/2021 CLINICAL DATA:  Aortic valve replacement EXAM: PORTABLE CHEST 1 VIEW COMPARISON:  05/20/2021 FINDINGS: Postop median sternotomy and aortic valve replacement. Prosthesis in satisfactory position. Endotracheal tube in good position.  Swan-Ganz catheter in the main pulmonary artery. NG tube enters the stomach with the tip not visualized. Lungs are well aerated and clear. No infiltrate edema or pneumothorax. IMPRESSION: Satisfactory postop aortic valve replacement. Electronically Signed   By: Marlan Palau M.D.   On: 05/22/2021 13:55   ECHO INTRAOPERATIVE TEE  Result Date: 05/24/2021  *INTRAOPERATIVE TRANSESOPHAGEAL REPORT *  Crystal Silva Name:   MARDELL SUTTLES  Date of Exam: 05/22/2021 Medical Rec #:  [id number]      Height:       69.0 in Accession #:    [id number]     Weight:       163.0 lb Date of Birth:  1960-11-08     BSA:          1.89 m Crystal Silva Age:    59 years       BP:           107/68 mmHg Crystal Silva Gender: F              HR:           92 bpm. Exam Location:  Anesthesiology Transesophogeal exam was perform intraoperatively during surgical procedure. Crystal Silva was closely monitored under general anesthesia during the entirety of examination. Indications:     Aortic Valve Disease Sonographer:     Eulah Pont RDCS Performing Phys: [id number] Dec[address] HENDRICKSON Diagnosing Phys: Achille Rich MD Complications: No known complications during this procedure. POST-OP IMPRESSIONS _ Aortic Valve: A bioprosthetic bioprosthetic valve was placed, leaflets are freely mobile Manufactured by; inspiris resilia Size; 68mm. The gradient recorded across the prosthetic valve is within the expected range. No perivalvular leak noted. PRE-OP FINDINGS  Left Ventricle: The left ventricle has hyperdynamic systolic function, with an ejection fraction of >65%. The cavity size was normal. There is mildly increased left ventricular wall thickness. There is mild concentric left ventricular hypertrophy. Right Ventricle: The right ventricle has normal systolic function. The cavity was normal. There is no increase in right ventricular wall thickness. Left Atrium: Left atrial size was normal in size. No left atrial/left atrial appendage thrombus was detected. Right Atrium: Right  atrial size was normal in size. Interatrial Septum: No atrial level shunt detected by color flow Doppler. Pericardium: There is no evidence of pericardial effusion. Mitral Valve: The mitral valve is normal in structure. Mitral valve regurgitation is trivial by color flow Doppler. Tricuspid Valve: The tricuspid valve was normal in structure. Tricuspid valve regurgitation was not visualized by color flow Doppler. Aortic Valve: The aortic valve is tricuspid Aortic valve regurgitation was not visualized by color flow Doppler. There is severe stenosis of the aortic valve, with a calculated valve area of 0.56 cm. Right and left coronary cusps are fused. Heavy calcification present. Pulmonic Valve: The pulmonic valve was normal in structure. Pulmonic valve regurgitation is not visualized by color flow Doppler. Aorta: The aortic root, ascending aorta and aortic arch are normal in size and structure. +--------------+--------++ LEFT VENTRICLE         +--------------+--------++ PLAX 2D                +--------------+--------++ LVOT diam:    2.10 cm  +--------------+--------++  LVOT Area:    3.46 cm +--------------+--------++                        +--------------+--------++ +------------------+------------++ AORTIC VALVE                   +------------------+------------++ AV Area (Vmax):   0.64 cm     +------------------+------------++ AV Area (Vmean):  0.59 cm     +------------------+------------++ AV Area (VTI):    0.56 cm     +------------------+------------++ AV Vmax:          327.00 cm/s  +------------------+------------++ AV Vmean:         245.000 cm/s +------------------+------------++ AV VTI:           0.916 m      +------------------+------------++ AV Peak Grad:     42.8 mmHg    +------------------+------------++ AV Mean Grad:     27.0 mmHg    +------------------+------------++ LVOT Vmax:        60.60 cm/s   +------------------+------------++ LVOT  Vmean:       41.400 cm/s  +------------------+------------++ LVOT VTI:         0.148 m      +------------------+------------++ LVOT/AV VTI ratio:0.16         +------------------+------------++  +--------------+-------+ SHUNTS                +--------------+-------+ Systemic VTI: 0.15 m  +--------------+-------+ Systemic Diam:2.10 cm +--------------+-------+  Achille Rich MD Electronically signed by Achille Rich MD Signature Date/Time: 05/24/2021/6:58:26 AM    Final    VAS US DOPPLER PRE CABG  Result Date: 05/01/2021 PREOPERATIVE VASCULAR EVALUATION Crystal Silva Name:  JACKQUELINE AQUILAR  Date of Exam:   05/01/2021 Medical Rec #: [id number]      Accession #:    [id number] Date of Birth: 29-May-1961     Crystal Silva Gender: F Crystal Silva Age:   50 years [facility]am [facility] Procedure:      VAS US DOPPLER PRE CABG Referring Phys: Gaston O'NEAL --------------------------------------------------------------------------------  Indications:      Pre-AVR. Risk Factors:     Current smoker. Other Factors:    Severe aortic stenosis. Comparison Study: No prior studies. Performing Technologist: Jean Rosenthal RDMS, RVT  Examination Guidelines: A complete evaluation includes B-mode imaging, spectral Doppler, color Doppler, and power Doppler as needed of all accessible portions of each vessel. Bilateral testing is considered an integral part of a complete examination. Limited examinations for reoccurring indications may be performed as noted.  Right Carotid Findings: +----------+--------+--------+--------+--------+------------------+           PSV cm/sEDV cm/sStenosisDescribeComments           +----------+--------+--------+--------+--------+------------------+ CCA Prox  71      19                                         +----------+--------+--------+--------+--------+------------------+ CCA Distal81      26                                          +----------+--------+--------+--------+--------+------------------+ ICA Prox  65      21                      intimal thickening +----------+--------+--------+--------+--------+------------------+ ICA Distal83  34                                         +----------+--------+--------+--------+--------+------------------+ ECA       91      14                                         +----------+--------+--------+--------+--------+------------------+ +----------+--------+-------+----------------+------------+           PSV cm/sEDV cmsDescribe        Arm Pressure +----------+--------+-------+----------------+------------+ Subclavian169            Multiphasic, WNL             +----------+--------+-------+----------------+------------+ +---------+--------+--+--------+--+---------+ VertebralPSV cm/s67EDV cm/s22Antegrade +---------+--------+--+--------+--+---------+ Left Carotid Findings: +----------+--------+--------+--------+--------+------------------+           PSV cm/sEDV cm/sStenosisDescribeComments           +----------+--------+--------+--------+--------+------------------+ CCA Prox  94      21                                         +----------+--------+--------+--------+--------+------------------+ CCA Distal75      18                                         +----------+--------+--------+--------+--------+------------------+ ICA Prox  49      19                      intimal thickening +----------+--------+--------+--------+--------+------------------+ ICA Distal53      23                                         +----------+--------+--------+--------+--------+------------------+ ECA       94      17                                         +----------+--------+--------+--------+--------+------------------+ +----------+--------+--------+----------------+------------+ SubclavianPSV cm/sEDV cm/sDescribe        Arm Pressure  +----------+--------+--------+----------------+------------+           121             Multiphasic, WNL             +----------+--------+--------+----------------+------------+ +---------+--------+--+--------+--+---------+ VertebralPSV cm/s33EDV cm/s12Antegrade +---------+--------+--+--------+--+---------+  ABI Findings: +--------+------------------+-----+---------+--------+ Right   Rt Pressure (mmHg)IndexWaveform Comment  +--------+------------------+-----+---------+--------+ Brachial                       triphasic         +--------+------------------+-----+---------+--------+ +--------+------------------+-----+---------+-------+ Left    Lt Pressure (mmHg)IndexWaveform Comment +--------+------------------+-----+---------+-------+ Brachial                       triphasic        +--------+------------------+-----+---------+-------+  Right Doppler Findings: +--------+--------+-----+---------+--------+ Site    PressureIndexDoppler  Comments +--------+--------+-----+---------+--------+ Brachial             triphasic         +--------+--------+-----+---------+--------+  Radial               triphasic         +--------+--------+-----+---------+--------+ Ulnar                triphasic         +--------+--------+-----+---------+--------+  Left Doppler Findings: +--------+--------+-----+---------+--------+ Site    PressureIndexDoppler  Comments +--------+--------+-----+---------+--------+ Brachial             triphasic         +--------+--------+-----+---------+--------+ Radial               triphasic         +--------+--------+-----+---------+--------+ Ulnar                biphasic          +--------+--------+-----+---------+--------+  Summary: Right Carotid: The extracranial vessels were near-normal with only minimal wall                thickening or plaque. Left Carotid: The extracranial vessels were near-normal with only minimal wall                thickening or plaque. Vertebrals:  Bilateral vertebral arteries demonstrate antegrade flow. Subclavians: Normal flow hemodynamics were seen in bilateral subclavian              arteries. Right Upper Extremity: Doppler waveform obliterate with right radial compression. Doppler waveforms remain within normal limits with right ulnar compression.  Left Upper Extremity: Doppler waveforms remain within normal limits with left radial compression. Doppler waveforms remain within normal limits with left ulnar compression.     Preliminary    ECHOCARDIOGRAM LIMITED  Result Date: 05/30/2021    ECHOCARDIOGRAM LIMITED REPORT   Crystal Silva Name:   ATTALIA GRONAU Date of Exam: 05/30/2021 Medical Rec #:  [id number]     Height:       69.0 in Accession #:    [id number]    Weight:       173.1 lb Date of Birth:  1961/07/05    BSA:          1.943 m Crystal Silva Age:    59 years      BP:           126/69 mmHg Crystal Silva Gender: F             HR:           96 bpm. [facility]am [facility] Procedure: Limited Echo Indications:    CHF  History:        Crystal Silva has prior history of Echocardiogram examinations, most                 recent 04/29/2021. COPD, Aortic Valve Disease,                 Signs/Symptoms:Chest Pain and Shortness of Breath; Risk                 Factors:Current Smoker. S/P Aortic Valve replacement-1 wk.                 Aortic Valve: 23 mm Inspiris Resilia valve is present in the                 aortic position.  Sonographer:    Mikki Harbor Referring Phys: [id number] OLADAPO ADEFESO IMPRESSIONS  1. Limited study.  2. Left ventricular ejection fraction, by estimation, is 60 to 65%. The left ventricle has normal function. There is mild left ventricular hypertrophy.  3. Right ventricular systolic function is normal. The right ventricular size is normal.  4. The mitral valve is grossly normal.  5. Tricuspid valve regurgitation is mild to moderate.  6. The aortic valve has been repaired/replaced. Aortic valve regurgitation is  not visualized. There is a 23 mm Inspiris Resilia valve present in the aortic position. No obvious paravaluvlar regurgitation. Valve was not completely interrogated. Comparison(s): Prior images reviewed side by side. Now status post bioprosthetic AVR. FINDINGS  Left Ventricle: Left ventricular ejection fraction, by estimation, is 60 to 65%. The left ventricle has normal function. The left ventricular internal cavity size was normal in size. There is mild left ventricular hypertrophy. Abnormal (paradoxical) septal motion consistent with post-operative status. Right Ventricle: The right ventricular size is normal. No increase in right ventricular wall thickness. Right ventricular systolic function is normal. Mitral Valve: The mitral valve is grossly normal. There is mild thickening of the mitral valve leaflet(s). Tricuspid Valve: The tricuspid valve is grossly normal. Tricuspid valve regurgitation is mild to moderate. Aortic Valve: The aortic valve has been repaired/replaced. Aortic valve regurgitation is not visualized. There is a 23 mm Inspiris Resilia valve present in the aortic position. Aorta: The aortic root is normal in size and structure. LEFT VENTRICLE PLAX 2D LVIDd:         3.90 cm LVIDs:         2.90 cm LV PW:         1.10 cm LV IVS:        1.20 cm LVOT diam:     2.10 cm LVOT Area:     3.46 cm  LEFT ATRIUM         Index LA diam:    3.30 cm 1.70 cm/m   AORTA Ao Root diam: 2.80 cm  SHUNTS Systemic Diam: 2.10 cm Nona Dell MD Electronically signed by Nona Dell MD Signature Date/Time: 05/30/2021/11:29:38 AM    Final    (Echo, Carotid, EGD, Colonoscopy, ERCP)    Subjective: Crystal Silva seen and examined.  Niece at the bedside.  Has some discomfort on the incision line otherwise denies any complaints.  Walked around.  Denies any shortness of breath.  No cough or sputum production.  Afebrile.   Discharge Exam: Vitals:   05/31/21 0503 05/31/21 0748  BP: 120/66   Pulse: 67   Resp: 17   Temp:  97.7 F (36.5 C)   SpO2: 93% 93%   Vitals:   05/30/21 2302 05/31/21 0404 05/31/21 0503 05/31/21 0748  BP: 117/61 (!) 109/52 120/66   Pulse: 72 68 67   Resp: Temp: 98.4 F (36.9 C) 98.1 F (36.7 C) 97.7 F (36.5 C)   TempSrc: Oral Oral Oral   SpO2: 93% 95% 93% 93%  Weight:   76.7 kg   Height:        General: Pt is alert, awake, not in acute distress Walking around in the hallway. Cardiovascular: RRR, S1/S2 +, no rubs, no gallops Crystal Silva has a midline sternal incision that looks clean and dry. Respiratory: CTA bilaterally, no wheezing, no rhonchi Abdominal: Soft, NT, ND, bowel sounds + Extremities: no edema, no cyanosis    The results of significant diagnostics from this hospitalization (including imaging, microbiology, ancillary and laboratory) are listed below for reference.     Microbiology: Recent Results (from the past 240 hour(s))  Resp Panel by RT-PCR (Flu A&B, Covid) Nasopharyngeal Swab     Status: None   Collection Time: 05/30/21  2:27 AM  Specimen: Nasopharyngeal Swab; Nasopharyngeal(NP) swabs in vial transport medium  Result Value Ref Range Status   SARS Coronavirus 2 by RT PCR NEGATIVE NEGATIVE Final    Comment: (NOTE) SARS-CoV-2 target nucleic acids are NOT DETECTED.  The SARS-CoV-2 RNA is generally detectable in upper respiratory specimens during the acute phase of infection. The lowest concentration of SARS-CoV-2 viral copies this assay can detect is 138 copies/mL. A negative result does not preclude SARS-Cov-2 infection and should not be used as the sole basis for treatment or other Crystal Silva management decisions. A negative result may occur with  improper specimen collection/handling, submission of specimen other than nasopharyngeal swab, presence of viral mutation(s) within the areas targeted by this assay, and inadequate number of viral copies(<138 copies/mL). A negative result must be combined with clinical observations, Crystal Silva history,  and epidemiological information. The expected result is Negative.  Fact Sheet for Patients:  BloggerCourse.com  Fact Sheet for Healthcare Providers:  SeriousBroker.it  This test is no t yet approved or cleared by the [place] FDA and  has been authorized for detection and/or diagnosis of SARS-CoV-2 by FDA under an Emergency Use Authorization (EUA). This EUA will remain  in effect (meaning this test can be used) for the duration of the COVID-19 declaration under Section 564(b)(1) of the Act, 21 U.S.C.section 360bbb-3(b)(1), unless the authorization is terminated  or revoked sooner.       Influenza A by PCR NEGATIVE NEGATIVE Final   Influenza B by PCR NEGATIVE NEGATIVE Final    Comment: (NOTE) The Xpert Xpress SARS-CoV-2/FLU/RSV plus assay is intended as an aid in the diagnosis of influenza from Nasopharyngeal swab specimens and should not be used as a sole basis for treatment. Nasal washings and aspirates are unacceptable for Xpert Xpress SARS-CoV-2/FLU/RSV testing.  Fact Sheet for Patients: BloggerCourse.com  Fact Sheet for Healthcare Providers: SeriousBroker.it  This test is not yet approved or cleared by the [place] FDA and has been authorized for detection and/or diagnosis of SARS-CoV-2 by FDA under an Emergency Use Authorization (EUA). This EUA will remain in effect (meaning this test can be used) for the duration of the COVID-19 declaration under Section 564(b)(1) of the Act, 21 U.S.C. section 360bbb-3(b)(1), unless the authorization is terminated or revoked.  Performed at [facility], [address], [place], [place] [address]      Labs: BNP (last 3 results) Recent Labs    04/29/21 0007 05/30/21 0030  BNP 37.0 378.0*   Basic Metabolic Panel: Recent Labs  Lab 05/25/21 0645 05/30/21 0030 05/30/21 0335  NA 136 138 137  K 4.1 3.5 3.8  CL 103 102  102  CO2 25 29 28   GLUCOSE 105* 119* 129*  BUN 15 15 14   CREATININE 0.62 0.67 0.55  CALCIUM 8.3* 8.7* 8.6*  MG  --   --  1.8  PHOS  --   --  3.8   Liver Function Tests: Recent Labs  Lab 05/30/21 0030 05/30/21 0335  AST 16 22  ALT 15 17  ALKPHOS 38 39  BILITOT 0.6 0.5  PROT 6.1* 6.1*  ALBUMIN 3.5 3.4*   No results for input(s): LIPASE, AMYLASE in the last 168 hours. No results for input(s): AMMONIA in the last 168 hours. CBC: Recent Labs  Lab 05/25/21 0645 05/30/21 0030 05/30/21 0335  WBC 3.1* 8.1 6.4  HGB 9.8* 11.5* 11.3*  HCT 29.3* 35.8* 34.9*  MCV 98.7 102.3* 102.6*  PLT 62* 217 198   Cardiac Enzymes: No results for input(s): CKTOTAL, CKMB,  CKMBINDEX, TROPONINI in the last 168 hours. BNP: Invalid input(s): POCBNP CBG: Recent Labs  Lab 05/25/21 1121 05/25/21 1629 05/25/21 2043 05/26/21 0604 05/26/21 1130  GLUCAP 95 110* 113* 98 137*   D-Dimer No results for input(s): DDIMER in the last 72 hours. Hgb A1c No results for input(s): HGBA1C in the last 72 hours. Lipid Profile No results for input(s): CHOL, HDL, LDLCALC, TRIG, CHOLHDL, LDLDIRECT in the last 72 hours. Thyroid function studies No results for input(s): TSH, T4TOTAL, T3FREE, THYROIDAB in the last 72 hours.  Invalid input(s): FREET3 Anemia work up No results for input(s): VITAMINB12, FOLATE, FERRITIN, TIBC, IRON, RETICCTPCT in the last 72 hours. Urinalysis    Component Value Date/Time   COLORURINE YELLOW 05/20/2021 1436   APPEARANCEUR HAZY (A) 05/20/2021 1436   LABSPEC 1.019 05/20/2021 1436   PHURINE 5.0 05/20/2021 1436   GLUCOSEU NEGATIVE 05/20/2021 1436   HGBUR SMALL (A) 05/20/2021 1436   BILIRUBINUR NEGATIVE 05/20/2021 1436   KETONESUR NEGATIVE 05/20/2021 1436   PROTEINUR NEGATIVE 05/20/2021 1436   UROBILINOGEN 0.2 05/02/2009 1500   NITRITE NEGATIVE 05/20/2021 1436   LEUKOCYTESUR LARGE (A) 05/20/2021 1436   Sepsis Labs Invalid input(s): PROCALCITONIN,  WBC,   LACTICIDVEN Microbiology Recent Results (from the past 240 hour(s))  Resp Panel by RT-PCR (Flu A&B, Covid) Nasopharyngeal Swab     Status: None   Collection Time: 05/30/21  2:27 AM   Specimen: Nasopharyngeal Swab; Nasopharyngeal(NP) swabs in vial transport medium  Result Value Ref Range Status   SARS Coronavirus 2 by RT PCR NEGATIVE NEGATIVE Final    Comment: (NOTE) SARS-CoV-2 target nucleic acids are NOT DETECTED.  The SARS-CoV-2 RNA is generally detectable in upper respiratory specimens during the acute phase of infection. The lowest concentration of SARS-CoV-2 viral copies this assay can detect is 138 copies/mL. A negative result does not preclude SARS-Cov-2 infection and should not be used as the sole basis for treatment or other Crystal Silva management decisions. A negative result may occur with  improper specimen collection/handling, submission of specimen other than nasopharyngeal swab, presence of viral mutation(s) within the areas targeted by this assay, and inadequate number of viral copies(<138 copies/mL). A negative result must be combined with clinical observations, Crystal Silva history, and epidemiological information. The expected result is Negative.  Fact Sheet for Patients:  BloggerCourse.com  Fact Sheet for Healthcare Providers:  SeriousBroker.it  This test is no t yet approved or cleared by the [place] FDA and  has been authorized for detection and/or diagnosis of SARS-CoV-2 by FDA under an Emergency Use Authorization (EUA). This EUA will remain  in effect (meaning this test can be used) for the duration of the COVID-19 declaration under Section 564(b)(1) of the Act, 21 U.S.C.section 360bbb-3(b)(1), unless the authorization is terminated  or revoked sooner.       Influenza A by PCR NEGATIVE NEGATIVE Final   Influenza B by PCR NEGATIVE NEGATIVE Final    Comment: (NOTE) The Xpert Xpress SARS-CoV-2/FLU/RSV plus  assay is intended as an aid in the diagnosis of influenza from Nasopharyngeal swab specimens and should not be used as a sole basis for treatment. Nasal washings and aspirates are unacceptable for Xpert Xpress SARS-CoV-2/FLU/RSV testing.  Fact Sheet for Patients: BloggerCourse.com  Fact Sheet for Healthcare Providers: SeriousBroker.it  This test is not yet approved or cleared by the [place] FDA and has been authorized for detection and/or diagnosis of SARS-CoV-2 by FDA under an Emergency Use Authorization (EUA). This EUA will remain in effect (meaning this test can  be used) for the duration of the COVID-19 declaration under Section 564(b)(1) of the Act, 21 U.S.C. section 360bbb-3(b)(1), unless the authorization is terminated or revoked.  Performed at [facility], [address] [place], [place] [address]      Time coordinating discharge:  35 minutes  SIGNED:   Dorcas Carrow, MD  [facility] 05/31/2021, 10:47 AM

## 2021-06-06 ENCOUNTER — Telehealth: Payer: Self-pay | Admitting: Family Medicine

## 2021-06-06 ENCOUNTER — Ambulatory Visit: Payer: BC Managed Care – PPO | Admitting: Family Medicine

## 2021-06-06 NOTE — Telephone Encounter (Signed)
Pt c/o medication issue:  1. Name of Medication: amiodarone (PACERONE) 400 MG tablet  2. How are you currently taking this medication (dosage and times per day)? As directed  3. Are you having a reaction (difficulty breathing--STAT)? NO 4. What is your medication issue?  PT IS TAKING AMIODARONE AND MUSINEX  EVERY 12 HOURS TO BREAK UP MUCUS AND SHE HAS BEEN FEELING JITTERY TODAY.

## 2021-06-06 NOTE — Telephone Encounter (Signed)
Reports taking plain mucinex 600 mg twice daily prescribed at recent hospital d/c. Medication profile updated Reports taking last prednisone dose yesterday Reports taking amiodarone 400 mg daily which reduced to daily yesterday after 7 days of twice daily All Medications reviewed Reports SOB and jittery with activity and a new symptoms since coming home from hospital Denies chest pain or dizziness BP & HR & O2 Sats are good BP 115/73 & HR 63 & O2 Sat 93% Recently seen 05/23/21. Next visit set for 6 months Advised if symptoms get worse before return call, to go to ED for an evaluation Verbalized understanding

## 2021-06-06 NOTE — Telephone Encounter (Signed)
If symptoms of feeling jittery very commonly this can come from prednisone. If completed course may start to feel better over the next few days. If ongoing update [place] in the next few days  Dominga Ferry MD

## 2021-06-06 NOTE — Telephone Encounter (Signed)
Patient informed and verbalized understanding of plan. 

## 2021-06-22 ENCOUNTER — Other Ambulatory Visit: Payer: Self-pay | Admitting: Surgical

## 2021-06-25 ENCOUNTER — Ambulatory Visit: Payer: BC Managed Care – PPO | Admitting: Thoracic Surgery (Cardiothoracic Vascular Surgery)

## 2021-07-01 ENCOUNTER — Other Ambulatory Visit: Payer: Self-pay | Admitting: Thoracic Surgery (Cardiothoracic Vascular Surgery)

## 2021-07-01 DIAGNOSIS — Z952 Presence of prosthetic heart valve: Secondary | ICD-10-CM

## 2021-07-02 ENCOUNTER — Ambulatory Visit (HOSPITAL_COMMUNITY)
Admission: RE | Admit: 2021-07-02 | Discharge: 2021-07-02 | Disposition: A | Discharge status: Home / Self Care | Payer: BC Managed Care – PPO | Source: Ambulatory Visit | Attending: Cardiology | Admitting: Cardiology

## 2021-07-02 ENCOUNTER — Ambulatory Visit (INDEPENDENT_AMBULATORY_CARE_PROVIDER_SITE_OTHER): Payer: Self-pay | Admitting: Thoracic Surgery (Cardiothoracic Vascular Surgery)

## 2021-07-02 ENCOUNTER — Ambulatory Visit: Payer: Self-pay | Admitting: Thoracic Surgery (Cardiothoracic Vascular Surgery)

## 2021-07-02 ENCOUNTER — Other Ambulatory Visit: Payer: Self-pay

## 2021-07-02 ENCOUNTER — Ambulatory Visit
Admission: RE | Admit: 2021-07-02 | Discharge: 2021-07-02 | Disposition: A | Discharge status: Home / Self Care | Payer: BC Managed Care – PPO | Source: Ambulatory Visit | Attending: Thoracic Surgery (Cardiothoracic Vascular Surgery) | Admitting: Thoracic Surgery (Cardiothoracic Vascular Surgery)

## 2021-07-02 ENCOUNTER — Encounter: Payer: Self-pay | Admitting: Thoracic Surgery (Cardiothoracic Vascular Surgery)

## 2021-07-02 VITALS — BP 114/74 | HR 70 | Resp 20 | Ht 69.0 in | Wt 155.0 lb

## 2021-07-02 DIAGNOSIS — Z952 Presence of prosthetic heart valve: Secondary | ICD-10-CM

## 2021-07-02 DIAGNOSIS — I359 Nonrheumatic aortic valve disorder, unspecified: Secondary | ICD-10-CM

## 2021-07-02 DIAGNOSIS — J939 Pneumothorax, unspecified: Secondary | ICD-10-CM | POA: Diagnosis not present

## 2021-07-02 DIAGNOSIS — J9811 Atelectasis: Secondary | ICD-10-CM | POA: Diagnosis not present

## 2021-07-02 DIAGNOSIS — I350 Nonrheumatic aortic (valve) stenosis: Secondary | ICD-10-CM | POA: Insufficient documentation

## 2021-07-02 LAB — ECHOCARDIOGRAM COMPLETE
AR max vel: 1.01 cm2
AV Area VTI: 0.96 cm2
AV Area mean vel: 1.04 cm2
AV Mean grad: 12 mmHg
AV Peak grad: 22 mmHg
Ao pk vel: 2.35 m/s
Area-P 1/2: 2.99 cm2
S' Lateral: 2.8 cm

## 2021-07-02 NOTE — Progress Notes (Signed)
[address]       [place] [address]             [phone]     HPI: Crystal Silva returns for a scheduled follow-up visit  Crystal Silva is a 60 year old woman with a history of tobacco abuse and bicuspid aortic valve who presented with dyspnea on exertion.  She was found to have severe aortic stenosis.  She did not have any significant coronary disease.  She underwent medical optimization and then had aortic valve replacement with a pericardial valve on 05/22/2021.  Postoperatively had some transient atrial fibrillation.  She went home on day 4.  She was readmitted about 10 days postop with some congestive heart failure.  She was diuresed and then has been doing well since then.  She denies any pain.  She does still get short of breath with exertion.  That is improving with time.  She says over the past week or so she has started to get some energy back.  She is anxious to return to work.  Past Medical History:  Diagnosis Date   Dyspnea    Headache    Heart murmur     Current Outpatient Medications  Medication Sig Dispense Refill   acetaminophen (TYLENOL) 500 MG tablet Take 500 mg by mouth every 6 (six) hours as needed for moderate pain.     albuterol (VENTOLIN HFA) 108 (90 Base) MCG/ACT inhaler Inhale 2 puffs into the lungs every 6 (six) hours as needed for wheezing or shortness of breath. 8 g 2   amiodarone (PACERONE) 400 MG tablet Take 1 tablet (400 mg total) by mouth every 12 (twelve) hours. For 7 day, then once daily (Patient taking differently: Take 400 mg by mouth daily.) 37 tablet 1   aspirin EC 325 MG EC tablet Take 1 tablet (325 mg total) by mouth daily.     metoprolol tartrate (LOPRESSOR) 25 MG tablet Take 1 tablet (25 mg total) by mouth 2 (two) times daily. 180 tablet 3   midodrine (PROAMATINE) 5 MG tablet Take 1 tablet (5 mg total) by mouth 3 (three) times daily with meals. 90 tablet 0   mometasone-formoterol (DULERA) 100-5 MCG/ACT AERO Inhale 2 puffs  into the lungs 2 (two) times daily as needed for wheezing or shortness of breath.     No current facility-administered medications for this visit.    Physical Exam BP 114/74 (BP Location: Left Arm, Patient Position: Sitting, Cuff Size: Normal)   Pulse 70   Resp 20   Ht 5\' 9"  (1.753 m)   Wt 155 lb (70.3 kg)   LMP 12/02/2013   SpO2 93% Comment: RA  BMI 22.58 kg/m  60 year old woman in no acute distress Alert and oriented x3 with no focal deficits Lungs clear bilaterally Cardiac regular rate and rhythm, no murmur Sternum stable, incision clean dry and intact  Diagnostic Tests: CHEST - 2 VIEW   COMPARISON:  05/30/2021   FINDINGS: Normal heart size post AVR.   Mediastinal contours and pulmonary vascularity normal.   Lungs clear.   Resolution of previously identified RIGHT apex pneumothorax.   No pulmonary infiltrate, pleural effusion, or acute osseous findings.   IMPRESSION: Postoperative changes of AVR.   Resolution of previously identified RIGHT apex pneumothorax and basilar atelectasis.     Electronically Signed   By: 06/01/2021 M.D.   On: 07/02/2021 14:29 I personally reviewed the chest x-ray images  Impression: Crystal Silva is a 60 year old woman  with history of tobacco abuse and aortic stenosis.  She had an aortic valve replacement with a pericardial valve about 5 weeks ago.  She had some atrial fibrillation in the early postoperative period.  She has not had any atrial fibrillation that she is aware of since discharge.  She remains on amiodarone.  Will defer to Dr. Flora Lipps as to when to stop.  I advised her to wait about 3 more weeks before lifting anything over 10 to 15 pounds.  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  She has been driving and has been doing well with that.  Her activities are otherwise unrestricted.  We initially had set a date for January 4 return to work.  She says that she often has to park a good ways from the office when she goes  in and can get short of breath doing that.  I advised her to wait until she can walk that distance without getting short of breath before returning.  Tobacco abuse-continues to smoke.  Advised her of the importance of tobacco cessation.  Did have a small lung nodule and needs a follow-up CT in 6 months for that.  Plan: Follow-up with Dr. Flora Lipps as scheduled I will be happy to see Crystal Silva back at anytime if I can be of any further assistance with her care She will need a follow-up CT in 6 months.  She can do that in [place] or if she prefers we could scan her here.  [name], MD [facility] [phone]

## 2021-07-02 NOTE — Progress Notes (Signed)
*  PRELIMINARY RESULTS* Echocardiogram 2D Echocardiogram has been performed.  Stacey Drain 07/02/2021, 11:46 AM

## 2021-07-09 ENCOUNTER — Encounter: Payer: Self-pay | Admitting: Thoracic Surgery (Cardiothoracic Vascular Surgery)

## 2021-07-12 ENCOUNTER — Other Ambulatory Visit (HOSPITAL_COMMUNITY): Payer: Self-pay | Admitting: Family Medicine

## 2021-07-12 DIAGNOSIS — G43909 Migraine, unspecified, not intractable, without status migrainosus: Secondary | ICD-10-CM | POA: Diagnosis not present

## 2021-07-12 DIAGNOSIS — I350 Nonrheumatic aortic (valve) stenosis: Secondary | ICD-10-CM | POA: Diagnosis not present

## 2021-07-12 DIAGNOSIS — I959 Hypotension, unspecified: Secondary | ICD-10-CM | POA: Diagnosis not present

## 2021-07-12 DIAGNOSIS — Z48812 Encounter for surgical aftercare following surgery on the circulatory system: Secondary | ICD-10-CM | POA: Diagnosis not present

## 2021-07-12 DIAGNOSIS — Z1231 Encounter for screening mammogram for malignant neoplasm of breast: Secondary | ICD-10-CM

## 2021-07-13 ENCOUNTER — Encounter: Payer: Self-pay | Admitting: Thoracic Surgery (Cardiothoracic Vascular Surgery)

## 2021-07-13 ENCOUNTER — Other Ambulatory Visit: Payer: Self-pay | Admitting: Surgical

## 2021-07-15 ENCOUNTER — Encounter: Payer: Self-pay | Admitting: Internal Medicine

## 2021-07-15 ENCOUNTER — Encounter: Payer: Self-pay | Admitting: Cardiology

## 2021-07-15 ENCOUNTER — Telehealth: Payer: Self-pay | Admitting: *Deleted

## 2021-07-15 MED ORDER — MIDODRINE HCL 5 MG PO TABS: 5.0000 mg | ORAL_TABLET | Freq: Three times a day (TID) | ORAL | 2 refills | Status: DC

## 2021-07-15 NOTE — Telephone Encounter (Signed)
Ok to refill midodrine  Dominga Ferry MD

## 2021-07-15 NOTE — Telephone Encounter (Signed)
Lesle Chris, LPN  44/10/1538 08:67 AM EST Back to Top    Error - no pcp.    Lesle Chris, LPN  61/04/5092 26:71 AM EST     Notified, copy to pcp.    Antoine Poche, MD  07/08/2021  9:53 AM EST     Normal echo, her artificial valve looks good and is working normally   Dominga Ferry MD

## 2021-07-23 ENCOUNTER — Other Ambulatory Visit: Payer: Self-pay | Admitting: Surgical

## 2021-07-26 ENCOUNTER — Other Ambulatory Visit: Payer: Self-pay | Admitting: Surgical

## 2021-07-29 NOTE — Telephone Encounter (Signed)
Her afib was postop her valve surgery, typically do amiodarone for a period after and then stop. I would d/c her amiodarone.  Dominga Ferry  MD

## 2021-07-30 ENCOUNTER — Other Ambulatory Visit: Payer: Self-pay

## 2021-08-13 DIAGNOSIS — Z0000 Encounter for general adult medical examination without abnormal findings: Secondary | ICD-10-CM | POA: Diagnosis not present

## 2021-08-13 DIAGNOSIS — I959 Hypotension, unspecified: Secondary | ICD-10-CM | POA: Diagnosis not present

## 2021-08-19 DIAGNOSIS — Z0001 Encounter for general adult medical examination with abnormal findings: Secondary | ICD-10-CM | POA: Diagnosis not present

## 2021-08-21 ENCOUNTER — Other Ambulatory Visit: Payer: Self-pay | Admitting: Family Medicine

## 2021-08-21 ENCOUNTER — Other Ambulatory Visit (HOSPITAL_COMMUNITY): Payer: Self-pay | Admitting: Family Medicine

## 2021-08-21 ENCOUNTER — Encounter: Payer: Self-pay | Admitting: Gastroenterology

## 2021-08-21 DIAGNOSIS — R0989 Other specified symptoms and signs involving the circulatory and respiratory systems: Secondary | ICD-10-CM

## 2021-08-29 ENCOUNTER — Ambulatory Visit (HOSPITAL_COMMUNITY): Payer: BC Managed Care – PPO

## 2021-09-02 ENCOUNTER — Encounter (HOSPITAL_COMMUNITY): Payer: Self-pay

## 2021-09-02 ENCOUNTER — Ambulatory Visit (HOSPITAL_COMMUNITY): Payer: BC Managed Care – PPO

## 2021-09-04 ENCOUNTER — Ambulatory Visit (HOSPITAL_COMMUNITY): Payer: BC Managed Care – PPO

## 2021-09-22 ENCOUNTER — Other Ambulatory Visit: Payer: Self-pay | Admitting: Cardiology

## 2021-09-23 ENCOUNTER — Ambulatory Visit: Payer: BC Managed Care – PPO | Admitting: Nurse Practitioner

## 2021-11-15 ENCOUNTER — Ambulatory Visit: Payer: BC Managed Care – PPO | Admitting: Gastroenterology

## 2021-11-19 ENCOUNTER — Other Ambulatory Visit: Payer: Self-pay | Admitting: *Deleted

## 2021-11-19 DIAGNOSIS — R911 Solitary pulmonary nodule: Secondary | ICD-10-CM

## 2021-11-25 DIAGNOSIS — R030 Elevated blood-pressure reading, without diagnosis of hypertension: Secondary | ICD-10-CM | POA: Diagnosis not present

## 2021-11-25 DIAGNOSIS — R0609 Other forms of dyspnea: Secondary | ICD-10-CM | POA: Diagnosis not present

## 2021-11-27 ENCOUNTER — Ambulatory Visit: Payer: BC Managed Care – PPO | Admitting: Gastroenterology

## 2021-12-31 ENCOUNTER — Encounter: Payer: BC Managed Care – PPO | Admitting: Thoracic Surgery (Cardiothoracic Vascular Surgery)

## 2021-12-31 ENCOUNTER — Other Ambulatory Visit: Payer: BC Managed Care – PPO

## 2022-02-13 DIAGNOSIS — E785 Hyperlipidemia, unspecified: Secondary | ICD-10-CM | POA: Diagnosis not present

## 2022-02-13 DIAGNOSIS — R7303 Prediabetes: Secondary | ICD-10-CM | POA: Diagnosis not present

## 2022-02-20 DIAGNOSIS — Z48812 Encounter for surgical aftercare following surgery on the circulatory system: Secondary | ICD-10-CM | POA: Diagnosis not present

## 2022-02-20 DIAGNOSIS — G43909 Migraine, unspecified, not intractable, without status migrainosus: Secondary | ICD-10-CM | POA: Diagnosis not present

## 2022-02-20 DIAGNOSIS — Z954 Presence of other heart-valve replacement: Secondary | ICD-10-CM | POA: Diagnosis not present

## 2022-02-20 DIAGNOSIS — I350 Nonrheumatic aortic (valve) stenosis: Secondary | ICD-10-CM | POA: Diagnosis not present

## 2022-05-21 ENCOUNTER — Encounter: Payer: Self-pay | Admitting: Physician Assistant

## 2022-05-21 ENCOUNTER — Other Ambulatory Visit: Payer: Self-pay | Admitting: Thoracic Surgery (Cardiothoracic Vascular Surgery)

## 2022-05-21 ENCOUNTER — Ambulatory Visit: Payer: BC Managed Care – PPO | Attending: Physician Assistant | Admitting: Physician Assistant

## 2022-05-21 ENCOUNTER — Telehealth: Payer: Self-pay | Admitting: *Deleted

## 2022-05-21 VITALS — BP 112/80 | HR 72 | Ht 69.0 in | Wt 184.0 lb

## 2022-05-21 DIAGNOSIS — Z952 Presence of prosthetic heart valve: Secondary | ICD-10-CM | POA: Diagnosis not present

## 2022-05-21 DIAGNOSIS — J449 Chronic obstructive pulmonary disease, unspecified: Secondary | ICD-10-CM

## 2022-05-21 DIAGNOSIS — Z720 Tobacco use: Secondary | ICD-10-CM

## 2022-05-21 DIAGNOSIS — R0602 Shortness of breath: Secondary | ICD-10-CM

## 2022-05-21 DIAGNOSIS — R911 Solitary pulmonary nodule: Secondary | ICD-10-CM

## 2022-05-21 NOTE — Progress Notes (Signed)
Cardiology Office Note:    Date:  05/21/2022   ID:  [name], DOB 14-Dec-1960, MRN [id number]  PCP:  Benita Stabile, MD  [facility] Providers Cardiologist:  Dina Rich, MD     Referring MD: Benita Stabile, MD   Chief Complaint:  Shortness of Breath     History of Present Illness:   Crystal Silva is a 61 y.o. female with  history of severe AS S/P bioprosthetic AVR 05/22/21, psot op Afib treated with Amio but stopped, COPD, no CAD on [facility] 04/2021  Patient last seen by [place] 05/2021 with worsening DOE and elevated HR's 90's. CTA stable, echo no valve complications normal LVEF. Has pulmonary nodules and is scheduled for CT.   Patient comes in for worsening DOE going up stairs or little activity or carrying anything progressively worsening in the past 3 months. Has some chest pressure as well. Smoking 1 ppd. Under a lot of stress. Had to get a new job and now has custody of her grandson-9 yrs old.     Past Medical History:  Diagnosis Date   Dyspnea    Headache    Heart murmur    Current Medications: Current Meds  Medication Sig   acetaminophen (TYLENOL) 500 MG tablet Take 500 mg by mouth every 6 (six) hours as needed for moderate pain.   albuterol (VENTOLIN HFA) 108 (90 Base) MCG/ACT inhaler Inhale 2 puffs into the lungs every 6 (six) hours as needed for wheezing or shortness of breath.   aspirin EC 325 MG EC tablet Take 1 tablet (325 mg total) by mouth daily.   BREZTRI AEROSPHERE 160-9-4.8 MCG/ACT AERO Inhale 2 puffs into the lungs 2 (two) times daily.   metoprolol tartrate (LOPRESSOR) 25 MG tablet Take 1 tablet (25 mg total) by mouth 2 (two) times daily.   sertraline (ZOLOFT) 25 MG tablet Take 25 mg by mouth daily.   [DISCONTINUED] midodrine (PROAMATINE) 5 MG tablet TAKE 1 TABLET (5 MG TOTAL) BY MOUTH 3 (THREE) TIMES DAILY WITH MEALS.   [DISCONTINUED] mometasone-formoterol (DULERA) 100-5 MCG/ACT AERO Inhale 2 puffs into the lungs 2 (two) times daily as needed for  wheezing or shortness of breath.    Allergies:   Patient has no known allergies.   Social History   Tobacco Use   Smoking status: Every Day    Packs/day: 1.00    Years: 40.00    Total pack years: 40.00    Types: Cigarettes   Smokeless tobacco: Never  Substance Use Topics   Alcohol use: No   Drug use: No    Family Hx: The patient's family history includes Heart disease in her mother; Heart failure in her mother.  ROS     Physical Exam:    VS:  BP 112/80   Pulse 72   Ht 5\' 9"  (1.753 m)   Wt 184 lb (83.5 kg)   LMP 12/02/2013   SpO2 95%   BMI 27.17 kg/m     Wt Readings from Last 3 Encounters:  05/21/22 184 lb (83.5 kg)  07/02/21 155 lb (70.3 kg)  05/31/21 168 lb 6.4 oz (76.4 kg)    Physical Exam  GEN: Well nourished, well developed, in no acute distress  Neck: no JVD, carotid bruits, or masses Cardiac:RRR; no murmurs, rubs, or gallops  Respiratory:  decreased breath sounds but clear to auscultation bilaterally, normal work of breathing GI: soft, nontender, nondistended, + BS Ext: without cyanosis, clubbing, or edema, Good distal pulses bilaterally MS:  no deformity or atrophy  Skin: warm and dry, no rash Neuro:  Alert and Oriented x 3, Strength and sensation are intact Psych: euthymic mood, full affect        EKGs/Labs/Other Test Reviewed:    EKG:  EKG is  not ordered today.    Recent Labs: 05/24/2021: TSH 2.323 05/30/2021: ALT 17; B Natriuretic Peptide 378.0; BUN 14; Creatinine, Ser 0.55; Hemoglobin 11.3; Magnesium 1.8; Platelets 198; Potassium 3.8; Sodium 137   Recent Lipid Panel No results for input(s): "CHOL", "TRIG", "HDL", "VLDL", "LDLCALC", "LDLDIRECT" in the last 8760 hours.   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 [facility]am [facility]  Procedure: 2D Echo, Cardiac Doppler and Color Doppler  Indications:    Aortic valve disorder I35.9  History:        Patient has prior history of Echocardiogram examinations, most recent 05/30/2021. COPD; Aortic Valve Disease. S/P Aortic Valve replacement-6 wk. Aortic Valve: 23 mm Inspiris Resilia valve is present in the aortic position. Tobacco use disorder.  Sonographer:    Celesta Gentile RCS Referring Phys: [id number] Dorothe Pea BRANCH  IMPRESSIONS   1. Left ventricular ejection fraction, by estimation, is 60 to 65%. The left ventricle has normal function. The left ventricle has no regional wall motion abnormalities. Left ventricular diastolic parameters were normal. 2. Right ventricular systolic function is normal. The right ventricular size is normal. There is normal pulmonary artery systolic pressure. 3. The mitral valve is normal in structure. Trivial mitral valve regurgitation. No evidence of mitral stenosis. 4. The tricuspid valve is abnormal. 5. 23 mm Inspiris Resilia bovine pericardial valve is in the AV position. . The aortic valve has been repaired/replaced. Aortic valve regurgitation is not visualized. No aortic stenosis is present. 6. The inferior vena cava is dilated in size with >50% respiratory variability, suggesting right atrial pressure of 8 mmHg.  FINDINGS Left Ventricle: Left ventricular ejection fraction, by estimation, is 60 to 65%. The left ventricle has normal function. The left ventricle has no regional wall motion abnormalities. The left ventricular internal cavity size was normal in size. There is no left ventricular hypertrophy. Left ventricular diastolic parameters were normal.  Right Ventricle: The right ventricular size is normal. No increase in right ventricular wall  thickness. Right ventricular systolic function is normal. There is normal pulmonary artery systolic pressure. The tricuspid regurgitant velocity is 2.46 m/s, and with an assumed right atrial pressure of 8 mmHg, the estimated right ventricular systolic pressure is 32.2 mmHg.  Left Atrium: Left atrial size was normal in size.  Right Atrium: Right atrial size was normal in size.  Pericardium: There is no evidence of pericardial effusion.  Mitral Valve: The mitral valve is normal in structure. Trivial mitral valve regurgitation.  No evidence of mitral valve stenosis.  Tricuspid Valve: The tricuspid valve is abnormal. Tricuspid valve regurgitation is mild . No evidence of tricuspid stenosis.  Aortic Valve: 23 mm Inspiris Resilia bovine pericardial valve is in the AV position. The aortic valve has been repaired/replaced. Aortic valve regurgitation is not visualized. No aortic stenosis is present. Aortic valve mean gradient measures 12.0 mmHg. Aortic valve peak gradient measures 22.0 mmHg. Aortic valve area, by VTI measures 0.96 cm.  Pulmonic Valve: The pulmonic valve was not well visualized. Pulmonic valve regurgitation is not visualized. No evidence of pulmonic stenosis.  Aorta: The aortic root is normal in size and structure.  Venous: The inferior vena cava is dilated in size with greater than 50% respiratory variability, suggesting right atrial pressure of 8 mmHg.  IAS/Shunts: No atrial level shunt detected by color flow Doppler.   LEFT VENTRICLE PLAX 2D LVIDd:         4.60 cm   Diastology LVIDs:         2.80 cm   LV e' medial:    9.79 cm/s LV PW:         1.00 cm   LV E/e' medial:  11.2 LV IVS:        1.00 cm   LV e' lateral:   10.80 cm/s LVOT diam:     1.70 cm   LV E/e' lateral: 10.2 LV SV:         57 LV SV Index:   30 LVOT Area:     2.27 cm   RIGHT VENTRICLE RV S prime:     9.36 cm/s TAPSE (M-mode): 1.7 cm  LEFT ATRIUM             Index        RIGHT ATRIUM           Index LA  diam:        3.40 cm 1.77 cm/m   RA Area:     14.90 cm LA Vol (A2C):   53.6 ml 27.91 ml/m  RA Volume:   39.00 ml  20.31 ml/m LA Vol (A4C):   35.5 ml 18.49 ml/m LA Biplane Vol: 44.3 ml 23.07 ml/m AORTIC VALVE AV Area (Vmax):    1.01 cm AV Area (Vmean):   1.04 cm AV Area (VTI):     0.96 cm AV Vmax:           234.50 cm/s AV Vmean:          163.000 cm/s AV VTI:            0.599 m AV Peak Grad:      22.0 mmHg AV Mean Grad:      12.0 mmHg LVOT Vmax:         104.60 cm/s LVOT Vmean:        74.900 cm/s LVOT VTI:          0.252 m LVOT/AV VTI ratio: 0.42  AORTA Ao Root diam: 3.30 cm  MITRAL VALVE                TRICUSPID VALVE MV Area (PHT): 2.99 cm     TR Peak grad:   24.2 mmHg MV Decel Time: 254 msec     TR Vmax:        246.00 cm/s MV E velocity: 110.00 cm/s MV A velocity: 69.40 cm/s   SHUNTS MV E/A ratio:  1.59         Systemic VTI:  0.25 m Systemic Diam:  1.70 cm  Dina Rich MD Electronically signed by Dina Rich MD Signature Date/Time: 07/02/2021/3:41:10 PM    Final        Risk Assessment/Calculations/Metrics:              ASSESSMENT & PLAN:   No problem-specific Assessment & Plan notes found for this encounter.   DOE worse in the past 3 months, continues to smoke 1 ppd and can't quit due to stressors. Normal coronaries on cardiac cath a year ago. I suspect this is all due to COPD. Will order PFT's and refer to pulm. CT scan ordered by Dr. Dorris Fetch for f/u pulmonary nodules  S/P bioprosthetic AVR 05/22/21-valve sounds normal and echo stable 06/2021. Will repeat echo but don't think this is her issue.  COPD with pulmonary nodules scheduled for f/u CT  Tobacco abuse-smoking cessation discussed          Dispo:  No follow-ups on file.   Medication Adjustments/Labs and Tests Ordered: Current medicines are reviewed at length with the patient today.  Concerns regarding medicines are outlined above.  Tests Ordered: Orders Placed This Encounter   Procedures   Ambulatory referral to Pulmonology   ECHOCARDIOGRAM COMPLETE   Pulmonary Function Test   Medication Changes: No orders of the defined types were placed in this encounter.  Signed, Jacolyn Reedy, PA-C  05/21/2022 12:09 PM    [facility] [address] [place], [place], [place]  [address] Phone: [phone]; Fax: [phone]

## 2022-05-21 NOTE — Telephone Encounter (Signed)
Patient contacted the office c/o SOB over the past 2-3 months and a generalized "shaky" feeling. Patient is s/p AVR 05/22/2021 by Dr. Roxan Hockey. Patient states she is feeling worse now than before surgery. States she is unable to walk up stairs without becoming increasingly SOB. Advised patient to contact Cardiologist to discuss symptoms. Patient was scheduled for CT chest and follow up with Dr. Roxan Hockey in May for pulmonary nodules. Patient reports having to cancel related to not having insurance coverage at the time. Advised that we can get her rescheduled with a CT to follow up on those nodules if she wishes. Patient agreed and told we would be back in contact with appts. Patient acknowledged receipt.

## 2022-05-21 NOTE — Patient Instructions (Signed)
Medication Instructions:  Your physician recommends that you continue on your current medications as directed. Please refer to the Current Medication list given to you today.  *If you need a refill on your cardiac medications before your next appointment, please call your pharmacy*   Lab Work: None If you have labs (blood work) drawn today and your tests are completely normal, you will receive your results only by: [place] (if you have MyChart) OR A paper copy in the mail If you have any lab test that is abnormal or we need to change your treatment, we will call you to review the results.   Testing/Procedures: Your physician has requested that you have an echocardiogram. Echocardiography is a painless test that uses sound waves to create images of your heart. It provides your doctor with information about the size and shape of your heart and how well your heart's chambers and valves are working. This procedure takes approximately one hour. There are no restrictions for this procedure.   Your physician has recommended that you have a pulmonary function test. Pulmonary Function Tests are a group of tests that measure how well air moves in and out of your lungs.   Follow-Up: At [facility], you and your health needs are our priority.  As part of our continuing mission to provide you with exceptional heart care, we have created designated Provider Care Teams.  These Care Teams include your primary Cardiologist (physician) and Advanced Practice Providers (APPs -  Physician Assistants and Nurse Practitioners) who all work together to provide you with the care you need, when you need it.   Your next appointment:   6 month(s)  The format for your next appointment:   In Person  Provider:   Carlyle Dolly, MD   Other Instructions You have been referred to see pulmonary. You will receive a call to schedule.   Important Information About Sugar

## 2022-06-05 ENCOUNTER — Encounter (HOSPITAL_COMMUNITY): Payer: Self-pay

## 2022-06-05 ENCOUNTER — Emergency Department (HOSPITAL_COMMUNITY)
Admission: EM | Admit: 2022-06-05 | Discharge: 2022-06-05 | Disposition: A | Discharge status: Home / Self Care | Payer: BC Managed Care – PPO | Attending: Emergency Medicine | Admitting: Emergency Medicine

## 2022-06-05 ENCOUNTER — Emergency Department (HOSPITAL_COMMUNITY): Payer: BC Managed Care – PPO

## 2022-06-05 DIAGNOSIS — S32049A Unspecified fracture of fourth lumbar vertebra, initial encounter for closed fracture: Secondary | ICD-10-CM | POA: Insufficient documentation

## 2022-06-05 DIAGNOSIS — G8929 Other chronic pain: Secondary | ICD-10-CM | POA: Diagnosis not present

## 2022-06-05 DIAGNOSIS — X500XXA Overexertion from strenuous movement or load, initial encounter: Secondary | ICD-10-CM | POA: Insufficient documentation

## 2022-06-05 DIAGNOSIS — S3992XA Unspecified injury of lower back, initial encounter: Secondary | ICD-10-CM | POA: Diagnosis not present

## 2022-06-05 DIAGNOSIS — Z7982 Long term (current) use of aspirin: Secondary | ICD-10-CM | POA: Diagnosis not present

## 2022-06-05 DIAGNOSIS — M545 Low back pain, unspecified: Secondary | ICD-10-CM | POA: Diagnosis not present

## 2022-06-05 DIAGNOSIS — M5459 Other low back pain: Secondary | ICD-10-CM | POA: Diagnosis not present

## 2022-06-05 MED ORDER — HYDROCODONE-ACETAMINOPHEN 5-325 MG PO TABS
1.0000 | ORAL_TABLET | Freq: Once | ORAL | Status: AC
  Administered 2022-06-05: 1 via ORAL
  Filled 2022-06-05: qty 1

## 2022-06-05 MED ORDER — LIDOCAINE 5 % EX PTCH
1.0000 | MEDICATED_PATCH | CUTANEOUS | Status: DC
  Administered 2022-06-05: 1 via TRANSDERMAL
  Filled 2022-06-05: qty 1

## 2022-06-05 MED ORDER — KETOROLAC TROMETHAMINE 15 MG/ML IJ SOLN
15.0000 mg | Freq: Once | INTRAMUSCULAR | Status: AC
  Administered 2022-06-05: 15 mg via INTRAMUSCULAR
  Filled 2022-06-05: qty 1

## 2022-06-05 NOTE — ED Provider Notes (Signed)
[facility] EMERGENCY DEPARTMENT Provider Note   CSN: [id number] Arrival date & time: 06/05/22  4098     History Chief Complaint  Patient presents with   Back Pain    Crystal Silva is a 61 y.o. female patient who presents to the emergency department today for further evaluation of right lower back pain has been waxing and waning over the last couple of months.  Patient states that she initially was trying to lift something with her camper when she felt the pain in her back.  She states that it will get better from time to time and then she will do something or moving a certain way and her pain will come back.  She has been taking ibuprofen at home.  She denies any radiation of pain down her legs.  She denies any bowel or bladder incontinence.  No focal weakness or numbness to the legs.  No fever or chills.    Back Pain      Home Medications Prior to Admission medications   Medication Sig Start Date End Date Taking? Authorizing Provider  acetaminophen (TYLENOL) 500 MG tablet Take 500 mg by mouth every 6 (six) hours as needed for moderate pain.   Yes [provider]  albuterol (VENTOLIN HFA) 108 (90 Base) MCG/ACT inhaler Inhale 2 puffs into the lungs every 6 (six) hours as needed for wheezing or shortness of breath. 05/31/21  Yes Ghimire, Dante Gang, MD  BREZTRI AEROSPHERE 160-9-4.8 MCG/ACT AERO Inhale 2 puffs into the lungs 2 (two) times daily. 04/24/22  Yes [provider]  metoprolol tartrate (LOPRESSOR) 25 MG tablet Take 1 tablet (25 mg total) by mouth 2 (two) times daily. 05/28/21 06/05/22 Yes Verta Ellen., NP  Multiple Vitamin (MULTIVITAMIN WITH MINERALS) TABS tablet Take 1 tablet by mouth daily.   Yes [provider]  sertraline (ZOLOFT) 25 MG tablet Take 25 mg by mouth daily. 05/16/22  Yes [provider]  aspirin EC 325 MG EC tablet Take 1 tablet (325 mg total) by mouth daily. 05/26/21   John Giovanni, PA-C      Allergies    Patient has  no known allergies.    Review of Systems   Review of Systems  Musculoskeletal:  Positive for back pain.  All other systems reviewed and are negative.   Physical Exam Updated Vital Signs BP (!) 142/73   Pulse 66   Temp 98 F (36.7 C) (Oral)   Resp 18   Ht 5\' 9"  (1.753 m)   Wt 81.6 kg   LMP 12/02/2013   SpO2 98%   BMI 26.58 kg/m  Physical Exam Vitals and nursing note reviewed.  Constitutional:      Appearance: Normal appearance.  HENT:     Head: Normocephalic and atraumatic.  Eyes:     General:        Right eye: No discharge.        Left eye: No discharge.     Conjunctiva/sclera: Conjunctivae normal.  Pulmonary:     Effort: Pulmonary effort is normal.  Musculoskeletal:     Comments: There is midline tenderness and right paralumbar muscular tenderness to the lower back.  Negative straight leg raise.  5/5 strength to the lower extremities.  Normal sensation to the lower extremities.  Skin:    General: Skin is warm and dry.     Findings: No rash.  Neurological:     General: No focal deficit present.     Mental Status: She is  alert.  Psychiatric:        Mood and Affect: Mood normal.        Behavior: Behavior normal.     ED Results / Procedures / Treatments   Labs (all labs ordered are listed, but only abnormal results are displayed) Labs Reviewed - No data to display  EKG None  Radiology DG Lumbar Spine Complete  Result Date: 06/05/2022 CLINICAL DATA:  Low back pain EXAM: LUMBAR SPINE - COMPLETE 4+ VIEW COMPARISON:  CTA 05/01/2021 FINDINGS: Interval development of a superior endplate fracture of L4 with approximately 25% loss of height, age indeterminate. No retropulsion. Normal lumbar lordosis. Remaining vertebral body height has been preserved. Intervertebral disc space at L5-S1 is not well profiled on this examination. Remaining intervertebral disc spaces are preserved. Advanced atherosclerotic calcifications seen within the aortoiliac vasculature. Paraspinal  soft tissues are otherwise unremarkable. Oblique views demonstrate no evidence of pars defect. IMPRESSION: Age-indeterminate superior endplate fracture of L4 with approximately 25% loss of height. No retropulsion. Correlation for point tenderness would be helpful. MRI examination may be helpful to confirm a clinically suspected acute fracture. Electronically Signed   By: Helyn Numbers M.D.   On: 06/05/2022 12:32    Procedures Procedures    Medications Ordered in ED Medications  lidocaine (LIDODERM) 5 % 1 patch (1 patch Transdermal Patch Applied 06/05/22 1057)  ketorolac (TORADOL) 15 MG/ML injection 15 mg (15 mg Intramuscular Given 06/05/22 1057)  HYDROcodone-acetaminophen (NORCO/VICODIN) 5-325 MG per tablet 1 tablet (1 tablet Oral Given 06/05/22 1058)    ED Course/ Medical Decision Making/ A&P Clinical Course as of 06/05/22 1324  Thu Jun 05, 2022  1322 DG Lumbar Spine Complete Personally ordered and interpreted the study.  There is evidence of a possible fracture of the lumbar spine.  I do agree with the radiologist interpretation. [CF]    Clinical Course User Index [CF] Teressa Lower, PA-C                           Medical Decision Making Crystal Silva is a 61 y.o. female patient who presents to the emergency department today for further evaluation of lower back pain.  I suspect this is likely muscular in nature but considering that this been going on for couple months in the midline tenderness to her lumbar spine, I will evaluate with a image of the lumbar spine.  I will also give her Toradol, Norco, and lidocaine patch.  I will plan to reassess once some of the imaging results.  She is in no acute distress at this time and I have a low suspicion for cauda equina or epidural abscess at this time.  On reevaluation, patient's back is feeling much better. I notified the patient of imaging results.  I will have her follow-up with orthopedics.  She will continue taking anti-inflammatories  and alternate with Tylenol.  Patient agreeable this plan.  Strict return precautions were discussed.  She is safe for discharge.  Do not feel MRI is warranted at this time.  She has no lower neurological deficits.  Again no evidence of cauda equina or cord impingement.  Amount and/or Complexity of Data Reviewed Radiology: ordered.  Risk Prescription drug management.   Final Clinical Impression(s) / ED Diagnoses Final diagnoses:  Chronic midline low back pain without sciatica  Closed fracture of fourth lumbar vertebra, unspecified fracture morphology, initial encounter (HCC)    Rx / DC Orders ED Discharge Orders  None         Honor Loh [place], [place] 06/05/22 1324    Bethann Berkshire, MD 06/10/22 1048

## 2022-06-05 NOTE — ED Notes (Signed)
Patient transported to X-ray 

## 2022-06-05 NOTE — Discharge Instructions (Signed)
Please continue taking 600 mg ibuprofen every 6 hours as needed for pain.  Around 3 to 4-hour mark you can add on Tylenol and alternate.  Please follow-up with orthopedics like we discussed.  Please return to the emergency department for any worsening symptoms you might have.

## 2022-06-05 NOTE — ED Notes (Signed)
Pt returned from xray

## 2022-06-05 NOTE — ED Triage Notes (Signed)
Pt states she injured her back a few months ago while trying to lift something. States pain has progressively gotten worse and rates it 5/10 while sitting but states it is 10/10 when she "moves just right"

## 2022-06-08 ENCOUNTER — Encounter (HOSPITAL_COMMUNITY): Payer: Self-pay | Admitting: Emergency Medicine

## 2022-06-08 ENCOUNTER — Emergency Department (HOSPITAL_COMMUNITY): Payer: BC Managed Care – PPO

## 2022-06-08 ENCOUNTER — Emergency Department (HOSPITAL_COMMUNITY)
Admission: EM | Admit: 2022-06-08 | Discharge: 2022-06-08 | Disposition: A | Discharge status: Home / Self Care | Payer: BC Managed Care – PPO | Attending: Emergency Medicine | Admitting: Emergency Medicine

## 2022-06-08 DIAGNOSIS — Z7982 Long term (current) use of aspirin: Secondary | ICD-10-CM | POA: Diagnosis not present

## 2022-06-08 DIAGNOSIS — F17210 Nicotine dependence, cigarettes, uncomplicated: Secondary | ICD-10-CM | POA: Insufficient documentation

## 2022-06-08 DIAGNOSIS — J439 Emphysema, unspecified: Secondary | ICD-10-CM | POA: Diagnosis not present

## 2022-06-08 DIAGNOSIS — R911 Solitary pulmonary nodule: Secondary | ICD-10-CM | POA: Diagnosis not present

## 2022-06-08 DIAGNOSIS — E876 Hypokalemia: Secondary | ICD-10-CM | POA: Diagnosis not present

## 2022-06-08 DIAGNOSIS — Z79899 Other long term (current) drug therapy: Secondary | ICD-10-CM | POA: Diagnosis not present

## 2022-06-08 DIAGNOSIS — D696 Thrombocytopenia, unspecified: Secondary | ICD-10-CM | POA: Insufficient documentation

## 2022-06-08 DIAGNOSIS — J441 Chronic obstructive pulmonary disease with (acute) exacerbation: Secondary | ICD-10-CM | POA: Diagnosis not present

## 2022-06-08 DIAGNOSIS — R069 Unspecified abnormalities of breathing: Secondary | ICD-10-CM | POA: Diagnosis not present

## 2022-06-08 DIAGNOSIS — R0602 Shortness of breath: Secondary | ICD-10-CM | POA: Diagnosis not present

## 2022-06-08 LAB — CBC WITH DIFFERENTIAL/PLATELET
Abs Immature Granulocytes: 0.02 10*3/uL (ref 0.00–0.07)
Basophils Absolute: 0 10*3/uL (ref 0.0–0.1)
Basophils Relative: 1 %
Eosinophils Absolute: 0.1 10*3/uL (ref 0.0–0.5)
Eosinophils Relative: 1 %
HCT: 43.7 % (ref 36.0–46.0)
Hemoglobin: 14.5 g/dL (ref 12.0–15.0)
Immature Granulocytes: 1 %
Lymphocytes Relative: 29 %
Lymphs Abs: 1.2 10*3/uL (ref 0.7–4.0)
MCH: 32.9 pg (ref 26.0–34.0)
MCHC: 33.2 g/dL (ref 30.0–36.0)
MCV: 99.1 fL (ref 80.0–100.0)
Monocytes Absolute: 0.4 10*3/uL (ref 0.1–1.0)
Monocytes Relative: 10 %
Neutro Abs: 2.5 10*3/uL (ref 1.7–7.7)
Neutrophils Relative %: 58 %
Platelets: 113 10*3/uL — ABNORMAL LOW (ref 150–400)
RBC: 4.41 MIL/uL (ref 3.87–5.11)
RDW: 12.6 % (ref 11.5–15.5)
WBC: 4.2 10*3/uL (ref 4.0–10.5)
nRBC: 0 % (ref 0.0–0.2)

## 2022-06-08 LAB — BASIC METABOLIC PANEL
Anion gap: 6 (ref 5–15)
BUN: 17 mg/dL (ref 6–20)
CO2: 27 mmol/L (ref 22–32)
Calcium: 8.5 mg/dL — ABNORMAL LOW (ref 8.9–10.3)
Chloride: 109 mmol/L (ref 98–111)
Creatinine, Ser: 0.54 mg/dL (ref 0.44–1.00)
GFR, Estimated: >60 mL/min (ref >60)
Glucose, Bld: 100 mg/dL — ABNORMAL HIGH (ref 70–99)
Potassium: 3.3 mmol/L — ABNORMAL LOW (ref 3.5–5.1)
Sodium: 142 mmol/L (ref 135–145)

## 2022-06-08 MED ORDER — PREDNISONE 20 MG PO TABS: 20.0000 mg | ORAL_TABLET | Freq: Every day | ORAL | 0 refills | Status: DC

## 2022-06-08 MED ORDER — ALBUTEROL SULFATE HFA 108 (90 BASE) MCG/ACT IN AERS
4.0000 | INHALATION_SPRAY | Freq: Once | RESPIRATORY_TRACT | Status: AC
  Administered 2022-06-08: 4 via RESPIRATORY_TRACT
  Filled 2022-06-08: qty 6.7

## 2022-06-08 MED ORDER — POTASSIUM CHLORIDE CRYS ER 20 MEQ PO TBCR
40.0000 meq | EXTENDED_RELEASE_TABLET | Freq: Once | ORAL | Status: AC
  Administered 2022-06-08: 40 meq via ORAL
  Filled 2022-06-08: qty 2

## 2022-06-08 MED ORDER — IPRATROPIUM-ALBUTEROL 0.5-2.5 (3) MG/3ML IN SOLN
3.0000 mL | Freq: Once | RESPIRATORY_TRACT | Status: AC
  Administered 2022-06-08: 3 mL via RESPIRATORY_TRACT
  Filled 2022-06-08: qty 3

## 2022-06-08 NOTE — ED Provider Notes (Signed)
[facility] EMERGENCY DEPARTMENT Provider Note   CSN: [id number] Arrival date & time: 06/08/22  2774     History  Chief Complaint  Patient presents with   Shortness of Breath    Crystal Silva is a 61 y.o. female.  The history is provided by the patient.  She has history COPD and comes in because of sudden worsening of shortness of breath tonight.  She has been having some shortness of breath over the last 3 days and cough productive of some clear to white sputum.  Symptoms have been controlled with albuterol inhaler at home.  However, tonight it got much worse and her inhaler did not help.  She denies fever, chills, sweats.  She denies any chest pain, heaviness, tightness, pressure.  She does still smoke 1 pack of cigarettes a day.  She came in by ambulance where she received methylprednisolone and albuterol with moderate improvement.  EMS reports oxygen saturation of 90% on room air.   Home Medications Prior to Admission medications   Medication Sig Start Date End Date Taking? Authorizing Provider  acetaminophen (TYLENOL) 500 MG tablet Take 500 mg by mouth every 6 (six) hours as needed for moderate pain.    [provider]  albuterol (VENTOLIN HFA) 108 (90 Base) MCG/ACT inhaler Inhale 2 puffs into the lungs every 6 (six) hours as needed for wheezing or shortness of breath. 05/31/21   Barb Merino, MD  aspirin EC 325 MG EC tablet Take 1 tablet (325 mg total) by mouth daily. 05/26/21   Gold, Wayne E, PA-C  BREZTRI AEROSPHERE 160-9-4.8 MCG/ACT AERO Inhale 2 puffs into the lungs 2 (two) times daily. 04/24/22   [provider]  metoprolol tartrate (LOPRESSOR) 25 MG tablet Take 1 tablet (25 mg total) by mouth 2 (two) times daily. 05/28/21 06/05/22  Verta Ellen., NP  Multiple Vitamin (MULTIVITAMIN WITH MINERALS) TABS tablet Take 1 tablet by mouth daily.    [provider]  sertraline (ZOLOFT) 25 MG tablet Take 25 mg by mouth daily. 05/16/22   [provider]      Allergies    Patient has no known allergies.    Review of Systems   Review of Systems  All other systems reviewed and are negative.   Physical Exam Updated Vital Signs LMP 12/02/2013  Physical Exam Vitals and nursing note reviewed.   61 year old female, appears mildly dyspneic, but is in no acute distress. Vital signs are normal. Oxygen saturation is 97%, which is normal. Head is normocephalic and atraumatic. PERRLA, EOMI. Oropharynx is clear. Neck is nontender and supple without adenopathy or JVD. Back is nontender and there is no CVA tenderness. Lungs have decreased airflow diffusely with scattered expiratory wheezes.  There are no rales or rhonchi. Chest is nontender. Heart has regular rate and rhythm without murmur. Abdomen is soft, flat, nontender. Extremities have no cyanosis or edema, full range of motion is present. Skin is warm and dry without rash. Neurologic: Mental status is normal, cranial nerves are intact, moves all extremities equally.  ED Results / Procedures / Treatments   Labs (all labs ordered are listed, but only abnormal results are displayed) Labs Reviewed  BASIC METABOLIC PANEL - Abnormal; Notable for the following components:      Result Value   Potassium 3.3 (*)    Glucose, Bld 100 (*)    Calcium 8.5 (*)    All other components within normal limits  CBC WITH DIFFERENTIAL/PLATELET - Abnormal; Notable for  the following components:   Platelets 113 (*)    All other components within normal limits    EKG EKG Interpretation  Date/Time:  Sunday June 08 2022 06:14:54 EDT Ventricular Rate:  80 PR Interval:  168 QRS Duration: 85 QT Interval:  395 QTC Calculation: 456 R Axis:   32 Text Interpretation: Sinus rhythm Low voltage, precordial leads Consider anterior infarct When compared with ECG of 05/30/2021, No significant change was found Confirmed by Delora Fuel ([id number]) on 06/08/2022 6:18:42 AM  Radiology DG Chest Port 1  View  Result Date: 06/08/2022 CLINICAL DATA:  61 year old female with history of shortness of breath. EXAM: PORTABLE CHEST 1 VIEW COMPARISON:  Chest x-ray 07/02/2021. FINDINGS: Lung volumes are increased with emphysematous changes. No consolidative airspace disease. No pleural effusions. No pneumothorax. Small pulmonary nodule in the right mid lung estimated measure 7 mm. Pulmonary vasculature and the cardiomediastinal silhouette are within normal limits. Atherosclerosis in the thoracic aorta. Status post median sternotomy for aortic valve replacement with a stented bioprosthesis. IMPRESSION: 1. No radiographic evidence of acute cardiopulmonary disease. 2. Aortic atherosclerosis. 3. Emphysema. 4. Small pulmonary nodule in the right mid lung corresponding to the right upper lobe pulmonary nodule noted on prior chest CT 05/30/2021. Electronically Signed   By: Vinnie Langton M.D.   On: 06/08/2022 06:27    Procedures Procedures  Cardiac monitor shows normal sinus rhythm, per my interpretation.  Medications Ordered in ED Medications  ipratropium-albuterol (DUONEB) 0.5-2.5 (3) MG/3ML nebulizer solution 3 mL (has no administration in time range)  potassium chloride SA (KLOR-CON M) CR tablet 40 mEq (has no administration in time range)  ipratropium-albuterol (DUONEB) 0.5-2.5 (3) MG/3ML nebulizer solution 3 mL (3 mLs Nebulization Given 06/08/22 0604)    ED Course/ Medical Decision Making/ A&P Clinical Course as of 06/08/22 0721  Sun Jun 08, 2022  0657 Stable 60 YOF with SOB. Likely COPD exacerbation. 2nd treatment now hopeful for DC. [CC]    Clinical Course User Index [CC] Tretha Sciara, MD                           Medical Decision Making Amount and/or Complexity of Data Reviewed Labs: ordered. Radiology: ordered.  Risk Prescription drug management.   COPD exacerbation.  I have ordered a chest x-ray to rule out pneumonia.  I have ordered an electrocardiogram and screening labs.  Doubt  pulmonary embolism.  Doubt ACS.  I have reviewed her past medical records, and see no prior ED visits or hospitalizations for COPD.  I have reviewed and interpreted her electrocardiogram, and my interpretation is low voltage, unchanged from prior.  Chest x-ray shows no acute process, previously known right midlung nodule still present.  I have independently viewed the image, and agree with radiologist interpretation.  I have reviewed and interpreted her laboratory tests and my interpretation is mild hypokalemia possibly related to albuterol administration, normal WBC and hemoglobin, mild thrombocytopenia of uncertain cause and significance.  Following nebulizer treatment, patient states that she still is feeling quite dyspneic.  Oxygen saturation is noted to be 89% on room air.  I have ordered another nebulizer treatment.  Case is signed out to Dr. Oswald Hillock.  Final Clinical Impression(s) / ED Diagnoses Final diagnoses:  COPD exacerbation ([place])  Hypokalemia  Thrombocytopenia ([place])    Rx / DC Orders ED Discharge Orders     None         Delora Fuel, MD [phone]

## 2022-06-08 NOTE — ED Provider Notes (Signed)
Clinical Course as of 06/08/22 1026  Sun Jun 08, 2022  0657 Stable 60 YOF with SOB. Likely COPD exacerbation. 2nd treatment now hopeful for DC. [CC]  0802 Treatments done, reassess [CC]  1025 Patient observed in emergency department for 5 hours and reassessed at this time, she has had near complete resolution of symptoms.  Wheezing has improved, shortness of breath is gone.  Patient feels comfortable with outpatient care and management.  No chest pain, saturating 96% on room air at this time resting comfortably. Patient's presentation seems most consistent with previously diagnosed COPD exacerbation.  Patient likely stable for outpatient care management at this time with no acute indication for intervention.  Reinforced that if her shortness of breath returns she will need to return to the emergency department for reassessment and possible admission if it continues to progress.  Otherwise patient should be stable for outpatient follow-up with PCP within 48 hours.  Patient discharged with no further acute events. [CC]    Clinical Course User Index [CC] Tretha Sciara, MD      Tretha Sciara, MD 06/08/22 1026

## 2022-06-08 NOTE — ED Notes (Signed)
Unable to obtain ekg r/t pt being unable to hold still

## 2022-06-08 NOTE — ED Triage Notes (Signed)
Pt BIB [facility] from home c/o SOB since yesterday morning. Pt was 90% on RA at home. Pt given 125mg  solumedrol IV, and 5mg  albuterol breathing treatment by EMS. Pt arrives 97% on RA.  20G R AC

## 2022-06-09 ENCOUNTER — Ambulatory Visit (HOSPITAL_COMMUNITY): Payer: BC Managed Care – PPO | Attending: Physician Assistant

## 2022-06-09 DIAGNOSIS — Z720 Tobacco use: Secondary | ICD-10-CM | POA: Diagnosis not present

## 2022-06-09 DIAGNOSIS — Z952 Presence of prosthetic heart valve: Secondary | ICD-10-CM | POA: Diagnosis not present

## 2022-06-09 DIAGNOSIS — J449 Chronic obstructive pulmonary disease, unspecified: Secondary | ICD-10-CM | POA: Diagnosis not present

## 2022-06-09 DIAGNOSIS — R0602 Shortness of breath: Secondary | ICD-10-CM | POA: Insufficient documentation

## 2022-06-09 LAB — ECHOCARDIOGRAM COMPLETE
AR max vel: 0.87 cm2
AV Area VTI: 0.95 cm2
AV Area mean vel: 0.94 cm2
AV Mean grad: 14 mmHg
AV Peak grad: 27.6 mmHg
Ao pk vel: 2.63 m/s
Area-P 1/2: 3.15 cm2
S' Lateral: 3 cm

## 2022-06-12 ENCOUNTER — Inpatient Hospital Stay (HOSPITAL_COMMUNITY)
Admission: EM | Admit: 2022-06-12 | Discharge: 2022-06-17 | DRG: 190 | Disposition: A | Discharge status: Home / Self Care | Payer: BC Managed Care – PPO | Attending: Internal Medicine | Admitting: Internal Medicine

## 2022-06-12 ENCOUNTER — Encounter (HOSPITAL_COMMUNITY): Payer: Self-pay | Admitting: Emergency Medicine

## 2022-06-12 ENCOUNTER — Emergency Department (HOSPITAL_COMMUNITY): Payer: BC Managed Care – PPO

## 2022-06-12 ENCOUNTER — Other Ambulatory Visit: Payer: Self-pay

## 2022-06-12 DIAGNOSIS — Z66 Do not resuscitate: Secondary | ICD-10-CM | POA: Diagnosis not present

## 2022-06-12 DIAGNOSIS — J9601 Acute respiratory failure with hypoxia: Secondary | ICD-10-CM | POA: Insufficient documentation

## 2022-06-12 DIAGNOSIS — R0600 Dyspnea, unspecified: Secondary | ICD-10-CM | POA: Diagnosis not present

## 2022-06-12 DIAGNOSIS — I10 Essential (primary) hypertension: Secondary | ICD-10-CM | POA: Diagnosis present

## 2022-06-12 DIAGNOSIS — Z8249 Family history of ischemic heart disease and other diseases of the circulatory system: Secondary | ICD-10-CM

## 2022-06-12 DIAGNOSIS — Z79899 Other long term (current) drug therapy: Secondary | ICD-10-CM | POA: Diagnosis not present

## 2022-06-12 DIAGNOSIS — E876 Hypokalemia: Secondary | ICD-10-CM | POA: Diagnosis not present

## 2022-06-12 DIAGNOSIS — Z952 Presence of prosthetic heart valve: Secondary | ICD-10-CM

## 2022-06-12 DIAGNOSIS — R911 Solitary pulmonary nodule: Secondary | ICD-10-CM | POA: Diagnosis not present

## 2022-06-12 DIAGNOSIS — J441 Chronic obstructive pulmonary disease with (acute) exacerbation: Secondary | ICD-10-CM | POA: Diagnosis not present

## 2022-06-12 DIAGNOSIS — Z7982 Long term (current) use of aspirin: Secondary | ICD-10-CM | POA: Diagnosis not present

## 2022-06-12 DIAGNOSIS — F32A Depression, unspecified: Secondary | ICD-10-CM | POA: Diagnosis present

## 2022-06-12 DIAGNOSIS — Z1152 Encounter for screening for COVID-19: Secondary | ICD-10-CM

## 2022-06-12 DIAGNOSIS — J9621 Acute and chronic respiratory failure with hypoxia: Secondary | ICD-10-CM | POA: Diagnosis present

## 2022-06-12 DIAGNOSIS — F17210 Nicotine dependence, cigarettes, uncomplicated: Secondary | ICD-10-CM | POA: Diagnosis present

## 2022-06-12 DIAGNOSIS — F17200 Nicotine dependence, unspecified, uncomplicated: Secondary | ICD-10-CM | POA: Diagnosis not present

## 2022-06-12 HISTORY — DX: Chronic obstructive pulmonary disease, unspecified: J44.9

## 2022-06-12 LAB — CBC WITH DIFFERENTIAL/PLATELET
Abs Immature Granulocytes: 0.05 10*3/uL (ref 0.00–0.07)
Basophils Absolute: 0 10*3/uL (ref 0.0–0.1)
Basophils Relative: 0 %
Eosinophils Absolute: 0 10*3/uL (ref 0.0–0.5)
Eosinophils Relative: 0 %
HCT: 45.6 % (ref 36.0–46.0)
Hemoglobin: 15.4 g/dL — ABNORMAL HIGH (ref 12.0–15.0)
Immature Granulocytes: 1 %
Lymphocytes Relative: 18 %
Lymphs Abs: 1.4 10*3/uL (ref 0.7–4.0)
MCH: 33.5 pg (ref 26.0–34.0)
MCHC: 33.8 g/dL (ref 30.0–36.0)
MCV: 99.1 fL (ref 80.0–100.0)
Monocytes Absolute: 0.5 10*3/uL (ref 0.1–1.0)
Monocytes Relative: 7 %
Neutro Abs: 5.7 10*3/uL (ref 1.7–7.7)
Neutrophils Relative %: 74 %
Platelets: 164 10*3/uL (ref 150–400)
RBC: 4.6 MIL/uL (ref 3.87–5.11)
RDW: 12.6 % (ref 11.5–15.5)
WBC: 7.7 10*3/uL (ref 4.0–10.5)
nRBC: 0 % (ref 0.0–0.2)

## 2022-06-12 LAB — BASIC METABOLIC PANEL
Anion gap: 13 (ref 5–15)
BUN: 14 mg/dL (ref 6–20)
CO2: 25 mmol/L (ref 22–32)
Calcium: 9 mg/dL (ref 8.9–10.3)
Chloride: 103 mmol/L (ref 98–111)
Creatinine, Ser: 0.66 mg/dL (ref 0.44–1.00)
GFR, Estimated: >60 mL/min (ref >60)
Glucose, Bld: 90 mg/dL (ref 70–99)
Potassium: 3.3 mmol/L — ABNORMAL LOW (ref 3.5–5.1)
Sodium: 141 mmol/L (ref 135–145)

## 2022-06-12 LAB — BRAIN NATRIURETIC PEPTIDE: B Natriuretic Peptide: 54.9 pg/mL (ref 0.0–100.0)

## 2022-06-12 NOTE — ED Triage Notes (Signed)
Patient reports persistent SOB worse with exertion since last Sunday , seen at [facility] ER diagnosed with COPD exacerbation , prescribed with Prednisone with no improvement . No fever or chills . Marland Kitchen

## 2022-06-12 NOTE — ED Provider Triage Note (Signed)
Emergency Medicine Provider Triage Evaluation Note  Crystal Silva , a 61 y.o. female  was evaluated in triage.  Pt complains of Shortness of breath and wheezing.  Patient has recent diagnosis of likely COPD.  She has upcoming appointments with pulmonology.  Was seen in [facility] about 5 days ago.  Treated with albuterol inhaler and steroid burst which she does not feel has been helping that much.  States that she can only walk 10 to 20 feet before becoming too short of breath today, prompting emergency department visit.  No chest pain.  No lower extremity swelling.  She has a history of aortic valve repair.  Review of Systems  Positive: Shortness of breath, wheezing Negative: Fever  Physical Exam  BP 120/80 (BP Location: Right Arm)   Pulse 93   Temp 98.4 F (36.9 C) (Oral)   Resp (!) 22   LMP 12/02/2013   SpO2 92%  Gen:   Awake, no distress   Resp:  Normal effort  MSK:   Moves extremities without difficulty  Other:  Prolonged expiratory phase breathing with scattered expiratory wheezing  Medical Decision Making  Medically screening exam initiated at 7:46 PM.  Appropriate orders placed.  Crystal Silva was informed that the remainder of the evaluation will be completed by another provider, this initial triage assessment does not replace that evaluation, and the importance of remaining in the ED until their evaluation is complete.     Carlisle Cater, PA-C 06/12/22 1948

## 2022-06-13 ENCOUNTER — Encounter (HOSPITAL_COMMUNITY): Payer: Self-pay | Admitting: Internal Medicine

## 2022-06-13 DIAGNOSIS — J441 Chronic obstructive pulmonary disease with (acute) exacerbation: Secondary | ICD-10-CM | POA: Diagnosis not present

## 2022-06-13 DIAGNOSIS — I10 Essential (primary) hypertension: Secondary | ICD-10-CM | POA: Diagnosis present

## 2022-06-13 DIAGNOSIS — F32A Depression, unspecified: Secondary | ICD-10-CM | POA: Diagnosis present

## 2022-06-13 DIAGNOSIS — Z66 Do not resuscitate: Secondary | ICD-10-CM | POA: Diagnosis present

## 2022-06-13 MED ORDER — IPRATROPIUM-ALBUTEROL 0.5-2.5 (3) MG/3ML IN SOLN
3.0000 mL | Freq: Once | RESPIRATORY_TRACT | Status: AC
  Administered 2022-06-13: 3 mL via RESPIRATORY_TRACT
  Filled 2022-06-13: qty 3

## 2022-06-13 MED ORDER — AZITHROMYCIN 250 MG PO TABS
500.0000 mg | ORAL_TABLET | Freq: Once | ORAL | Status: AC
  Administered 2022-06-13: 500 mg via ORAL
  Filled 2022-06-13: qty 2

## 2022-06-13 MED ORDER — SERTRALINE HCL 50 MG PO TABS
25.0000 mg | ORAL_TABLET | Freq: Every day | ORAL | Status: DC
  Administered 2022-06-13 – 2022-06-17 (×5): 25 mg via ORAL
  Filled 2022-06-13 (×5): qty 1

## 2022-06-13 MED ORDER — POLYETHYLENE GLYCOL 3350 17 G PO PACK: 17.0000 g | PACK | Freq: Every day | ORAL | Status: DC | PRN

## 2022-06-13 MED ORDER — PREDNISONE 20 MG PO TABS: 40.0000 mg | ORAL_TABLET | Freq: Every day | ORAL | Status: DC

## 2022-06-13 MED ORDER — ONDANSETRON HCL 4 MG PO TABS: 4.0000 mg | ORAL_TABLET | Freq: Four times a day (QID) | ORAL | Status: DC | PRN

## 2022-06-13 MED ORDER — NICOTINE 14 MG/24HR TD PT24
14.0000 mg | MEDICATED_PATCH | Freq: Every day | TRANSDERMAL | Status: DC | PRN
  Filled 2022-06-13: qty 1

## 2022-06-13 MED ORDER — MORPHINE SULFATE (PF) 2 MG/ML IV SOLN: 2.0000 mg | INTRAVENOUS | Status: DC | PRN

## 2022-06-13 MED ORDER — IPRATROPIUM-ALBUTEROL 0.5-2.5 (3) MG/3ML IN SOLN
3.0000 mL | Freq: Four times a day (QID) | RESPIRATORY_TRACT | Status: DC
  Administered 2022-06-13 – 2022-06-15 (×10): 3 mL via RESPIRATORY_TRACT
  Filled 2022-06-13 (×10): qty 3

## 2022-06-13 MED ORDER — METOPROLOL TARTRATE 25 MG PO TABS
25.0000 mg | ORAL_TABLET | Freq: Two times a day (BID) | ORAL | Status: DC
  Administered 2022-06-13 – 2022-06-17 (×9): 25 mg via ORAL
  Filled 2022-06-13 (×9): qty 1

## 2022-06-13 MED ORDER — METHYLPREDNISOLONE SODIUM SUCC 125 MG IJ SOLR
60.0000 mg | Freq: Two times a day (BID) | INTRAMUSCULAR | Status: AC
  Administered 2022-06-13 (×2): 60 mg via INTRAVENOUS
  Filled 2022-06-13 (×2): qty 2

## 2022-06-13 MED ORDER — ACETAMINOPHEN 650 MG RE SUPP: 650.0000 mg | Freq: Four times a day (QID) | RECTAL | Status: DC | PRN

## 2022-06-13 MED ORDER — ONDANSETRON HCL 4 MG/2ML IJ SOLN: 4.0000 mg | Freq: Four times a day (QID) | INTRAMUSCULAR | Status: DC | PRN

## 2022-06-13 MED ORDER — MAGNESIUM SULFATE 2 GM/50ML IV SOLN
2.0000 g | Freq: Once | INTRAVENOUS | Status: AC
  Administered 2022-06-13: 2 g via INTRAVENOUS
  Filled 2022-06-13: qty 50

## 2022-06-13 MED ORDER — DOCUSATE SODIUM 100 MG PO CAPS
100.0000 mg | ORAL_CAPSULE | Freq: Two times a day (BID) | ORAL | Status: DC
  Administered 2022-06-16: 100 mg via ORAL
  Filled 2022-06-13 (×7): qty 1

## 2022-06-13 MED ORDER — BUDESON-GLYCOPYRROL-FORMOTEROL 160-9-4.8 MCG/ACT IN AERO: 2.0000 | INHALATION_SPRAY | Freq: Two times a day (BID) | RESPIRATORY_TRACT | Status: DC

## 2022-06-13 MED ORDER — POTASSIUM CHLORIDE CRYS ER 20 MEQ PO TBCR
40.0000 meq | EXTENDED_RELEASE_TABLET | Freq: Once | ORAL | Status: AC
  Administered 2022-06-13: 40 meq via ORAL
  Filled 2022-06-13: qty 2

## 2022-06-13 MED ORDER — FLUTICASONE FUROATE-VILANTEROL 200-25 MCG/ACT IN AEPB
1.0000 | INHALATION_SPRAY | Freq: Two times a day (BID) | RESPIRATORY_TRACT | Status: DC
  Administered 2022-06-13 – 2022-06-14 (×2): 1 via RESPIRATORY_TRACT
  Filled 2022-06-13: qty 28

## 2022-06-13 MED ORDER — UMECLIDINIUM BROMIDE 62.5 MCG/ACT IN AEPB
1.0000 | INHALATION_SPRAY | Freq: Every day | RESPIRATORY_TRACT | Status: DC
  Administered 2022-06-14 – 2022-06-17 (×4): 1 via RESPIRATORY_TRACT
  Filled 2022-06-13: qty 7

## 2022-06-13 MED ORDER — BISACODYL 5 MG PO TBEC: 5.0000 mg | DELAYED_RELEASE_TABLET | Freq: Every day | ORAL | Status: DC | PRN

## 2022-06-13 MED ORDER — HYDRALAZINE HCL 20 MG/ML IJ SOLN: 5.0000 mg | INTRAMUSCULAR | Status: DC | PRN

## 2022-06-13 MED ORDER — METHYLPREDNISOLONE SODIUM SUCC 125 MG IJ SOLR
125.0000 mg | Freq: Once | INTRAMUSCULAR | Status: AC
  Administered 2022-06-13: 125 mg via INTRAVENOUS
  Filled 2022-06-13: qty 2

## 2022-06-13 MED ORDER — LACTATED RINGERS IV SOLN: INTRAVENOUS | Status: AC

## 2022-06-13 MED ORDER — GUAIFENESIN ER 600 MG PO TB12
600.0000 mg | ORAL_TABLET | Freq: Two times a day (BID) | ORAL | Status: DC | PRN
  Administered 2022-06-13 – 2022-06-16 (×4): 600 mg via ORAL
  Filled 2022-06-13 (×4): qty 1

## 2022-06-13 MED ORDER — SODIUM CHLORIDE 0.9 % IV SOLN
2.0000 g | Freq: Three times a day (TID) | INTRAVENOUS | Status: DC
  Administered 2022-06-13 – 2022-06-14 (×3): 2 g via INTRAVENOUS
  Filled 2022-06-13 (×3): qty 12.5

## 2022-06-13 MED ORDER — IPRATROPIUM BROMIDE 0.02 % IN SOLN
0.5000 mg | Freq: Once | RESPIRATORY_TRACT | Status: AC
  Administered 2022-06-13: 0.5 mg via RESPIRATORY_TRACT
  Filled 2022-06-13: qty 2.5

## 2022-06-13 MED ORDER — ALBUTEROL SULFATE (2.5 MG/3ML) 0.083% IN NEBU
2.5000 mg | INHALATION_SOLUTION | Freq: Once | RESPIRATORY_TRACT | Status: AC
  Administered 2022-06-13: 2.5 mg via RESPIRATORY_TRACT
  Filled 2022-06-13: qty 3

## 2022-06-13 MED ORDER — ASPIRIN 325 MG PO TBEC
325.0000 mg | DELAYED_RELEASE_TABLET | Freq: Every day | ORAL | Status: DC
  Administered 2022-06-13 – 2022-06-17 (×5): 325 mg via ORAL
  Filled 2022-06-13 (×5): qty 1

## 2022-06-13 MED ORDER — SODIUM CHLORIDE 0.9% FLUSH
3.0000 mL | Freq: Two times a day (BID) | INTRAVENOUS | Status: DC
  Administered 2022-06-13 – 2022-06-17 (×9): 3 mL via INTRAVENOUS

## 2022-06-13 MED ORDER — ZOLPIDEM TARTRATE 5 MG PO TABS
5.0000 mg | ORAL_TABLET | Freq: Every evening | ORAL | Status: DC | PRN
  Administered 2022-06-13 – 2022-06-15 (×3): 5 mg via ORAL
  Filled 2022-06-13 (×3): qty 1

## 2022-06-13 MED ORDER — ACETAMINOPHEN 325 MG PO TABS: 650.0000 mg | ORAL_TABLET | Freq: Four times a day (QID) | ORAL | Status: DC | PRN

## 2022-06-13 MED ORDER — ENOXAPARIN SODIUM 40 MG/0.4ML IJ SOSY
40.0000 mg | PREFILLED_SYRINGE | INTRAMUSCULAR | Status: DC
  Administered 2022-06-13 – 2022-06-15 (×3): 40 mg via SUBCUTANEOUS
  Filled 2022-06-13 (×4): qty 0.4

## 2022-06-13 MED ORDER — OXYCODONE HCL 5 MG PO TABS: 5.0000 mg | ORAL_TABLET | ORAL | Status: DC | PRN

## 2022-06-13 MED ORDER — STERILE WATER FOR INJECTION IJ SOLN
INTRAMUSCULAR | Status: AC
  Administered 2022-06-13: 2 mL
  Filled 2022-06-13: qty 10

## 2022-06-13 MED ORDER — METHYLPREDNISOLONE SODIUM SUCC 125 MG IJ SOLR: 125.0000 mg | Freq: Every day | INTRAMUSCULAR | Status: DC

## 2022-06-13 MED ORDER — ALBUTEROL SULFATE (2.5 MG/3ML) 0.083% IN NEBU
2.5000 mg | INHALATION_SOLUTION | RESPIRATORY_TRACT | Status: DC | PRN
  Administered 2022-06-13: 2.5 mg via RESPIRATORY_TRACT
  Filled 2022-06-13: qty 3

## 2022-06-13 NOTE — H&P (Signed)
History and Physical    Patient: Crystal Silva VZD:[id number] DOB: 1960-10-11 DOA: 06/12/2022 DOS: the patient was seen and examined on 06/13/2022 PCP: Celene Squibb, MD  Patient coming from: Home - lives with grandson; NOK: Daughter, [phone]   Chief Complaint: SOB  HPI: EFRAT ZUIDEMA is a 61 y.o. female with medical history significant of COPD presenting with SOB.  She was also seen for this issue in the [facility] ER on 10/29 and diagnosed with COPD exacerbation, treated with prednisone.  She reports that she can't walk across the room without losing her breath.  Symptoms started Sunday.  She is not on home O2.  She was seen 10/29, had a couple of breathing treatments and was given prednisone and was discharged.  She wasn't feeling much better but was hoping the prednisone and albuterol would help - it led to some increased cough with congestion but this has been mild.  Cough is mostly nonproductive but is occasionally productive of greenish-yellow slime.  No sick contacts.  No fevers.  She smokes >1ppd.  She has not been formally diagnosed with COPD but likely has it; due for PFTs on 11/17.  She has an echo on 10/30 because of weakness, difficulty catching her breath, "like I felt before I had this heart surgery."  Was concerned that it could be related to a valve again and that was why the echo was done.     ER Course:  Carryover, per Dr. Hal Hope:  61 year old female with known history of COPD and aortic stenosis status post aortic replacement presents to the ER because of shortness of breath.  Had come to the [facility], ER and was treated with steroid despite which patient was still short of breath.  Admitted for COPD exacerbation.      Review of Systems: As mentioned in the history of present illness. All other systems reviewed and are negative. Past Medical History:  Diagnosis Date   COPD (chronic obstructive pulmonary disease) ([place])    Headache    Heart murmur    Past Surgical  History:  Procedure Laterality Date   AORTIC VALVE REPLACEMENT N/A 05/22/2021   Procedure: AORTIC VALVE REPLACEMENT (AVR) WITH STERNAL PLATING. 40 MM INSPIRIS RESILIA  AORTIC VALVE.;  Surgeon: Melrose Nakayama, MD;  Location: [place];  Service: Open Heart Surgery;  Laterality: N/A;   CHOLECYSTECTOMY     lt knee surgery     RIGHT/LEFT HEART CATH AND CORONARY ANGIOGRAPHY N/A 04/30/2021   Procedure: RIGHT/LEFT HEART CATH AND CORONARY ANGIOGRAPHY;  Surgeon: Leonie Man, MD;  Location: [place] [facility];  Service: Cardiovascular;  Laterality: N/A;   TEE WITHOUT CARDIOVERSION N/A 05/22/2021   Procedure: TRANSESOPHAGEAL ECHOCARDIOGRAM (TEE);  Surgeon: Melrose Nakayama, MD;  Location: [place];  Service: Open Heart Surgery;  Laterality: N/A;   Social History:  reports that she has been smoking cigarettes. She has a 44.00 pack-year smoking history. She has never used smokeless tobacco. She reports current alcohol use. She reports that she does not use drugs.  No Known Allergies  Family History  Problem Relation Age of Onset   Heart failure Mother    Heart disease Mother        stent    Prior to Admission medications   Medication Sig Start Date End Date Taking? Authorizing Provider  acetaminophen (TYLENOL) 500 MG tablet Take 500 mg by mouth every 6 (six) hours as needed for moderate pain.    [provider]  albuterol (VENTOLIN  HFA) 108 (90 Base) MCG/ACT inhaler Inhale 2 puffs into the lungs every 6 (six) hours as needed for wheezing or shortness of breath. 05/31/21   Dorcas Carrow, MD  aspirin EC 325 MG EC tablet Take 1 tablet (325 mg total) by mouth daily. 05/26/21   Gold, Wayne E, PA-C  BREZTRI AEROSPHERE 160-9-4.8 MCG/ACT AERO Inhale 2 puffs into the lungs 2 (two) times daily. 04/24/22   [provider]  metoprolol tartrate (LOPRESSOR) 25 MG tablet Take 1 tablet (25 mg total) by mouth 2 (two) times daily. 05/28/21 06/05/22  Netta Neat., NP  Multiple Vitamin  (MULTIVITAMIN WITH MINERALS) TABS tablet Take 1 tablet by mouth daily.    [provider]  predniSONE (DELTASONE) 20 MG tablet Take 1 tablet (20 mg total) by mouth daily for 5 days. 06/08/22 06/13/22  Glyn Ade, MD  sertraline (ZOLOFT) 25 MG tablet Take 25 mg by mouth daily. 05/16/22   [provider]    Physical Exam: Vitals:   06/13/22 1157 06/13/22 1400 06/13/22 1530 06/13/22 1531  BP:  118/76 (!) 140/75   Pulse:  60 75   Resp:  18 20   Temp: 98.3 F (36.8 C) 98.9 F (37.2 C) 98.1 F (36.7 C)   TempSrc: Oral Oral Oral   SpO2:  94% 95%   Weight:    78.9 kg  Height:    5\' 9"  (1.753 m)   General:  Appears ill, fatigued, older than stated age Eyes:  PERRL, EOMI, normal lids, iris ENT:  grossly normal hearing, lips & tongue, mmm; some absent dentition Neck:  no LAD, masses or thyromegaly Cardiovascular:  RRR, no m/r/g. No LE edema.  Respiratory:  Mild diffuse wheezing with moderate air movement.  Normal respiratory effort on [place] O2. Abdomen:  soft, NT, ND Back:   normal alignment, no CVAT Skin:  no rash or induration seen on limited exam Musculoskeletal:  grossly normal tone BUE/BLE, good ROM, no bony abnormality Psychiatric:  blunted mood and affect, speech fluent and appropriate, AOx3 Neurologic:  CN 2-12 grossly intact, moves all extremities in coordinated fashion   Radiological Exams on Admission: Independently reviewed - see discussion in A/P where applicable  DG Chest 2 View  Result Date: 06/12/2022 CLINICAL DATA:  Dyspnea, COPD exacerbation EXAM: CHEST - 2 VIEW COMPARISON:  06/08/2022 FINDINGS: The lungs are hyperinflated in keeping with changes of underlying COPD. No confluent pulmonary infiltrate. Stable nodule within the right mid lung zone corresponding to a pulmonary nodule noted on CT examination of 05/30/2021. No pneumothorax or pleural effusion. Aortic valve replacement has been performed. Cardiac size within normal limits. Pulmonary  vascularity is normal. Interval development of age-indeterminate compression deformities of T8, T9, and T10. Stable superior endplate fracture of T12. IMPRESSION: 1. No active cardiopulmonary disease. COPD. 2. Interval development of age-indeterminate compression deformities of T8, T9, and T10. Electronically Signed   By: 06/01/2021 M.D.   On: 06/12/2022 20:28    EKG: Independently reviewed.  NSR with rate 86; nonspecific ST changes with no evidence of acute ischemia   Labs on Admission: I have personally reviewed the available labs and imaging studies at the time of the admission.  Pertinent labs:    K+ 3.3 BNP 54.9 Unremarkable CBC   Assessment and Plan: Principal Problem:   COPD exacerbation (HCC) Active Problems:   Tobacco use disorder   Essential hypertension   Depression   DNR (do not resuscitate)    Acute on chronic respiratory failure associated with  a COPD exacerbation -Patient's shortness of breath and productive cough are most likely caused by acute COPD exacerbation. -She does not have known COPD but has long-term smoking with recent symptoms and has upcoming PFTs which will likely diagnose her as such.  -She does not have a history of O2-dependent COPD but was hypoxic to 90% and may require home O2 depending on an ambulatory pulse ox ("walk of life") in the AM -She does not have fever or leukocytosis.  -Chest x-ray is not consistent with pneumonia -She was given a continuous neb treatment in the ED with some improvement.   -will observe for now and attempt to wean off [place] O2 if possible -Nebulizers: scheduled Duoneb and prn albuterol -Solu-Medrol 60 mg IV BID -> Prednisone 40 mg PO daily -IV Cefepime -Continue Breztri -Coordinated care with TOC team/PT/OT/Nutrition/RT consults  HTN -Continue metoprolol  Depression -Continue sertraline  Tobacco dependence -Encourage cessation.   -This was discussed with the patient and should be reviewed on an ongoing  basis.   -Patch declined by patient.  DNR -I have discussed code status with the patient and her sister and  they are in agreement that the patient would not desire resuscitation and would prefer to die a natural death should that situation arise. -She will need a gold out of facility DNR form at the time of discharge      Advance Care Planning:   Code Status: DNR   Consults: TOC team/PT/OT/Nutrition/RT   DVT Prophylaxis: Lovenox  Family Communication: Sister was present throughout evaluation   Severity of Illness: The appropriate patient status for this patient is OBSERVATION. Observation status is judged to be reasonable and necessary in order to provide the required intensity of service to ensure the patient's safety. The patient's presenting symptoms, physical exam findings, and initial radiographic and laboratory data in the context of their medical condition is felt to place them at decreased risk for further clinical deterioration. Furthermore, it is anticipated that the patient will be medically stable for discharge from the hospital within 2 midnights of admission.   Author: Jonah Blue, MD 06/13/2022 7:17 PM  For on call review [email or web address].

## 2022-06-13 NOTE — ED Notes (Addendum)
[facility] in dept to take pt to [facility] with belongings on tele monitoring and 1L [place]

## 2022-06-13 NOTE — Progress Notes (Signed)
New Admission Note:   Arrival Method: Stretcher  Mental Orientation: Alert and oriented  Telemetry: Box 11 Assessment: Completed Skin: intact, bilateral legs discoloration abrasion bilaterally arms  IV:18 G right AC Pain: 0/10  Tubes: nasal canula  Safety Measures: Safety Fall Prevention Plan has been given, discussed and signed Admission: Completed [facility] [place] Orientation: Patient has been orientated to the room, unit and staff.  Family: none   Orders have been reviewed and implemented. Will continue to monitor the patient. Call light has been placed within reach and bed alarm has been activated.   Damareon Lanni RN [place] [facility] Phone: [phone]

## 2022-06-13 NOTE — ED Notes (Signed)
Pt call light on at this time. Pt anxious and states "I can't breath". Pt reports going to the rest room and just returned. Pt assisted to lean over bedside table. PT o2 saturation 88%. PT placed on 2L [place]. Pt reports feeling better following relaxing and breathing with the o2.

## 2022-06-13 NOTE — ED Notes (Signed)
Pt removed her oxygen prior to this nurse entering room. O2 sats remain where provider wanted them so O2 was d/c.

## 2022-06-13 NOTE — ED Notes (Signed)
Pt noted to have 02 saturation 88%. Dr. Lorin Mercy aware. Pt placed on 1L [place] at this time.

## 2022-06-13 NOTE — ED Provider Notes (Signed)
[facility] EMERGENCY DEPARTMENT Provider Note   CSN: [id number] Arrival date & time: 06/12/22  1923     History  Chief Complaint  Patient presents with   SOB / COPD    Crystal Silva is a 61 y.o. female.  61 year old female with recent diagnosis of COPD, aortic valve replacement (05/2021), presents with complaint of shortness of breath, wheezing, cough.  Patient reports progressively worsening symptoms, went to [facility], ER on Sunday (5 days ago).  Was provided with steroids, multiple nebulizer treatments, improved and was ultimately discharged home on oral steroids and an inhaler which she has been using (4 puffs every 4 hours).  Patient was not able to obtain the nebulizer until today, did a breathing treatment at home without improvement and presented to the emergency room.  Patient is a 1 pack/day smoker x 40 years.  Denies fevers, chest pain, change in cough.  Arrived in the OR with O2 sat in low 90s, was placed on 3 L nasal cannula.       Home Medications Prior to Admission medications   Medication Sig Start Date End Date Taking? Authorizing Provider  acetaminophen (TYLENOL) 500 MG tablet Take 500 mg by mouth every 6 (six) hours as needed for moderate pain.    [provider]  albuterol (VENTOLIN HFA) 108 (90 Base) MCG/ACT inhaler Inhale 2 puffs into the lungs every 6 (six) hours as needed for wheezing or shortness of breath. 05/31/21   Dorcas Carrow, MD  aspirin EC 325 MG EC tablet Take 1 tablet (325 mg total) by mouth daily. 05/26/21   Gold, Wayne E, PA-C  BREZTRI AEROSPHERE 160-9-4.8 MCG/ACT AERO Inhale 2 puffs into the lungs 2 (two) times daily. 04/24/22   [provider]  metoprolol tartrate (LOPRESSOR) 25 MG tablet Take 1 tablet (25 mg total) by mouth 2 (two) times daily. 05/28/21 06/05/22  Netta Neat., NP  Multiple Vitamin (MULTIVITAMIN WITH MINERALS) TABS tablet Take 1 tablet by mouth daily.    [provider]   predniSONE (DELTASONE) 20 MG tablet Take 1 tablet (20 mg total) by mouth daily for 5 days. 06/08/22 06/13/22  Glyn Ade, MD  sertraline (ZOLOFT) 25 MG tablet Take 25 mg by mouth daily. 05/16/22   [provider]      Allergies    Patient has no known allergies.    Review of Systems   Review of Systems Negative except as per HPI Physical Exam Updated Vital Signs BP 123/68   Pulse 79   Temp 98.8 F (37.1 C) (Oral)   Resp 20   Ht 5\' 9"  (1.753 m)   Wt 81.6 kg   LMP 12/02/2013   SpO2 90%   BMI 26.58 kg/m  Physical Exam Vitals and nursing note reviewed.  Constitutional:      General: She is not in acute distress.    Appearance: She is well-developed. She is not diaphoretic.  HENT:     Head: Normocephalic and atraumatic.     Nose: Nose normal.     Mouth/Throat:     Mouth: Mucous membranes are moist.  Eyes:     Conjunctiva/sclera: Conjunctivae normal.  Cardiovascular:     Rate and Rhythm: Normal rate and regular rhythm.     Heart sounds: Normal heart sounds.  Pulmonary:     Effort: Tachypnea present.     Breath sounds: Wheezing present.  Musculoskeletal:     Cervical back: Neck supple.     Right lower leg:  No edema.     Left lower leg: No edema.  Skin:    General: Skin is warm and dry.     Findings: No erythema or rash.  Neurological:     Mental Status: She is alert and oriented to person, place, and time.  Psychiatric:        Behavior: Behavior normal.     ED Results / Procedures / Treatments   Labs (all labs ordered are listed, but only abnormal results are displayed) Labs Reviewed  CBC WITH DIFFERENTIAL/PLATELET - Abnormal; Notable for the following components:      Result Value   Hemoglobin 15.4 (*)    All other components within normal limits  BASIC METABOLIC PANEL - Abnormal; Notable for the following components:   Potassium 3.3 (*)    All other components within normal limits  BRAIN NATRIURETIC PEPTIDE    EKG None  Radiology DG  Chest 2 View  Result Date: 06/12/2022 CLINICAL DATA:  Dyspnea, COPD exacerbation EXAM: CHEST - 2 VIEW COMPARISON:  06/08/2022 FINDINGS: The lungs are hyperinflated in keeping with changes of underlying COPD. No confluent pulmonary infiltrate. Stable nodule within the right mid lung zone corresponding to a pulmonary nodule noted on CT examination of 05/30/2021. No pneumothorax or pleural effusion. Aortic valve replacement has been performed. Cardiac size within normal limits. Pulmonary vascularity is normal. Interval development of age-indeterminate compression deformities of T8, T9, and T10. Stable superior endplate fracture of T12. IMPRESSION: 1. No active cardiopulmonary disease. COPD. 2. Interval development of age-indeterminate compression deformities of T8, T9, and T10. Electronically Signed   By: Helyn Numbers M.D.   On: 06/12/2022 20:28    Procedures .Critical Care  Performed by: Jeannie Fend, PA-C Authorized by: Jeannie Fend, PA-C   Critical care provider statement:    Critical care time (minutes):  30   Critical care was time spent personally by me on the following activities:  Development of treatment plan with patient or surrogate, discussions with consultants, evaluation of patient's response to treatment, examination of patient, ordering and review of laboratory studies, ordering and review of radiographic studies, ordering and performing treatments and interventions, pulse oximetry, re-evaluation of patient's condition and review of old charts     Medications Ordered in ED Medications  magnesium sulfate IVPB 2 g 50 mL (2 g Intravenous New Bag/Given 06/13/22 0605)  ipratropium-albuterol (DUONEB) 0.5-2.5 (3) MG/3ML nebulizer solution 3 mL (3 mLs Nebulization Given 06/13/22 0313)  methylPREDNISolone sodium succinate (SOLU-MEDROL) 125 mg/2 mL injection 125 mg (125 mg Intravenous Given 06/13/22 0313)  potassium chloride SA (KLOR-CON M) CR tablet 40 mEq (40 mEq Oral Given 06/13/22  0330)  ipratropium-albuterol (DUONEB) 0.5-2.5 (3) MG/3ML nebulizer solution 3 mL (3 mLs Nebulization Given 06/13/22 0442)  albuterol (PROVENTIL) (2.5 MG/3ML) 0.083% nebulizer solution 2.5 mg (2.5 mg Nebulization Given 06/13/22 0603)  ipratropium (ATROVENT) nebulizer solution 0.5 mg (0.5 mg Nebulization Given 06/13/22 0603)  azithromycin (ZITHROMAX) tablet 500 mg (500 mg Oral Given 06/13/22 0602)    ED Course/ Medical Decision Making/ A&P                           Medical Decision Making Risk Prescription drug management.   This patient presents to the ED for concern of cough, wheezing, SHOB, this involves an extensive number of treatment options, and is a complaint that carries with it a high risk of complications and morbidity.  The differential diagnosis includes COPD exacerbation, CHF, pna,  COVID/flu   Co morbidities that complicate the patient evaluation  COPD   Additional history obtained:  Additional history obtained from family at bedside who contribute to history as above External records from outside source obtained and reviewed including prior CXR dated 06/08/22   Lab Tests:  I Ordered, and personally interpreted labs.  The pertinent results include: BNP normal at 54.9.  CBC without significant findings.  BMP with mild hypokalemia with potassium of 3.3.   Imaging Studies ordered:  I ordered imaging studies including CXR  I independently visualized and interpreted imaging which showed no acute disease (chronic COPD changes) I agree with the radiologist interpretation, notes stable right mid lung nodule   Cardiac Monitoring: / EKG:  The patient was maintained on a cardiac monitor.  I personally viewed and interpreted the cardiac monitored which showed an underlying rhythm of: Sinus rhythm, rate 86   Consultations Obtained:  I requested consultation with the hospitalist, Dr. Hal Hope,  and discussed lab and imaging findings as well as pertinent plan - they  recommend: Will admit   Problem List / ED Course / Critical interventions / Medication management  61 year old female with recent diagnosis of COPD, daily smoker presents with shortness of breath, wheezing, nonproductive cough.  Seen at [facility], ER earlier this week with similar symptoms, somewhat improved with nebulizer treatments and was sent home on steroids.  Continued with inhaler treatment with limited relief.  Received her nebulizer today and tried home neb treatment without significant improvement and return to the emergency room.  Patient satting 88 to 92% on room air on arrival.  She was provided with DuoNeb treatments as well as Solu-Medrol without significant improvement.  O2 sats 87% while sleeping.  Patient was placed on 1 L nasal cannula, able to hold at 91%.  Case discussed with ER attending, plan is for IV magnesium, additional nebulizer treatment and Zithromax with plan to admit to hospitalist.  Discussed with hospitalist who will consult for admission.  Patient verbalized understanding of plan of care. I ordered medication including solumedrol, duoneb, kdur  for COPD exacerbation, hypokalemia, magnesium and additional nebs as well as azithromycin for COPD exacerbation  Reevaluation of the patient after these medicines showed that the patient stayed the same I have reviewed the patients home medicines and have made adjustments as needed   Social Determinants of Health:  Lives at home, has PCP   Test / Admission - Considered:  Admit for further management         Final Clinical Impression(s) / ED Diagnoses Final diagnoses:  COPD exacerbation ([place])  Hypokalemia    Rx / DC Orders ED Discharge Orders     None         Tacy Learn, PA-C 06/13/22 0641    Fatima Blank, MD 06/13/22 1829

## 2022-06-13 NOTE — ED Notes (Signed)
O2 decreased to 1.5lpm per provider decrease O2 to maintain sats in the low 90s

## 2022-06-14 DIAGNOSIS — Z7982 Long term (current) use of aspirin: Secondary | ICD-10-CM | POA: Diagnosis not present

## 2022-06-14 DIAGNOSIS — Z8249 Family history of ischemic heart disease and other diseases of the circulatory system: Secondary | ICD-10-CM | POA: Diagnosis not present

## 2022-06-14 DIAGNOSIS — F17200 Nicotine dependence, unspecified, uncomplicated: Secondary | ICD-10-CM

## 2022-06-14 DIAGNOSIS — Z952 Presence of prosthetic heart valve: Secondary | ICD-10-CM | POA: Diagnosis not present

## 2022-06-14 DIAGNOSIS — I10 Essential (primary) hypertension: Secondary | ICD-10-CM

## 2022-06-14 DIAGNOSIS — E876 Hypokalemia: Secondary | ICD-10-CM | POA: Diagnosis present

## 2022-06-14 DIAGNOSIS — J9601 Acute respiratory failure with hypoxia: Secondary | ICD-10-CM | POA: Diagnosis not present

## 2022-06-14 DIAGNOSIS — J9621 Acute and chronic respiratory failure with hypoxia: Secondary | ICD-10-CM | POA: Diagnosis present

## 2022-06-14 DIAGNOSIS — F32A Depression, unspecified: Secondary | ICD-10-CM

## 2022-06-14 DIAGNOSIS — Z1152 Encounter for screening for COVID-19: Secondary | ICD-10-CM | POA: Diagnosis not present

## 2022-06-14 DIAGNOSIS — Z79899 Other long term (current) drug therapy: Secondary | ICD-10-CM | POA: Diagnosis not present

## 2022-06-14 DIAGNOSIS — F17210 Nicotine dependence, cigarettes, uncomplicated: Secondary | ICD-10-CM | POA: Diagnosis present

## 2022-06-14 DIAGNOSIS — Z66 Do not resuscitate: Secondary | ICD-10-CM | POA: Diagnosis present

## 2022-06-14 DIAGNOSIS — J441 Chronic obstructive pulmonary disease with (acute) exacerbation: Secondary | ICD-10-CM | POA: Diagnosis present

## 2022-06-14 LAB — CBC
HCT: 45.1 % (ref 36.0–46.0)
Hemoglobin: 15 g/dL (ref 12.0–15.0)
MCH: 33.3 pg (ref 26.0–34.0)
MCHC: 33.3 g/dL (ref 30.0–36.0)
MCV: 100.2 fL — ABNORMAL HIGH (ref 80.0–100.0)
Platelets: 168 10*3/uL (ref 150–400)
RBC: 4.5 MIL/uL (ref 3.87–5.11)
RDW: 12.5 % (ref 11.5–15.5)
WBC: 8.1 10*3/uL (ref 4.0–10.5)
nRBC: 0 % (ref 0.0–0.2)

## 2022-06-14 LAB — RESP PANEL BY RT-PCR (FLU A&B, COVID) ARPGX2
Influenza A by PCR: NEGATIVE
Influenza B by PCR: NEGATIVE
SARS Coronavirus 2 by RT PCR: NEGATIVE

## 2022-06-14 LAB — BASIC METABOLIC PANEL
Anion gap: 7 (ref 5–15)
BUN: 17 mg/dL (ref 6–20)
CO2: 28 mmol/L (ref 22–32)
Calcium: 9.2 mg/dL (ref 8.9–10.3)
Chloride: 106 mmol/L (ref 98–111)
Creatinine, Ser: 0.64 mg/dL (ref 0.44–1.00)
GFR, Estimated: >60 mL/min (ref >60)
Glucose, Bld: 130 mg/dL — ABNORMAL HIGH (ref 70–99)
Potassium: 5.1 mmol/L (ref 3.5–5.1)
Sodium: 141 mmol/L (ref 135–145)

## 2022-06-14 LAB — HIV ANTIBODY (ROUTINE TESTING W REFLEX): HIV Screen 4th Generation wRfx: NONREACTIVE

## 2022-06-14 MED ORDER — AZITHROMYCIN 500 MG PO TABS
500.0000 mg | ORAL_TABLET | Freq: Every day | ORAL | Status: DC
  Administered 2022-06-14 – 2022-06-17 (×4): 500 mg via ORAL
  Filled 2022-06-14 (×5): qty 1

## 2022-06-14 MED ORDER — METHYLPREDNISOLONE SODIUM SUCC 125 MG IJ SOLR
80.0000 mg | Freq: Every day | INTRAMUSCULAR | Status: DC
  Administered 2022-06-14 – 2022-06-15 (×2): 80 mg via INTRAVENOUS
  Filled 2022-06-14 (×2): qty 2

## 2022-06-14 MED ORDER — FLUTICASONE FUROATE-VILANTEROL 200-25 MCG/ACT IN AEPB
1.0000 | INHALATION_SPRAY | Freq: Every day | RESPIRATORY_TRACT | Status: DC
  Administered 2022-06-15 – 2022-06-17 (×3): 1 via RESPIRATORY_TRACT
  Filled 2022-06-14: qty 28

## 2022-06-14 MED ORDER — ENSURE ENLIVE PO LIQD
237.0000 mL | Freq: Two times a day (BID) | ORAL | Status: DC
  Administered 2022-06-15 – 2022-06-16 (×4): 237 mL via ORAL

## 2022-06-14 NOTE — Progress Notes (Signed)
PROGRESS NOTE    Crystal Silva  WRU:[id number] DOB: Dec 23, 1960 DOA: 06/12/2022 PCP: Benita Stabile, MD   Brief Narrative:  61 year old female with history of tobacco abuse, probable COPD, aortic stenosis status post aortic replacement presented with worsening shortness of breath.  She required supplemental oxygen on presentation; chest x-ray was negative for infiltrates.  She was started on IV Solu-Medrol.  Assessment & Plan:   Acute respiratory failure with hypoxia Probable COPD exacerbation Ongoing tobacco abuse -Currently still requiring 3 L oxygen via nasal cannula.  Does not wear oxygen at home.  Wean off as able. -No formal diagnosis of COPD.  Will need outpatient PFTs. -Will possibly need supplemental oxygen on discharge -Chest x-ray negative for infiltrates.  DC cefepime.  Add oral Zithromax. -Check influenza and COVID-19 -Continue Solu-Medrol 40 mg IV every 12 hours.  Continue current nebs and inhaler.  Outpatient evaluation and follow-up by pulmonary -Counseled regarding tobacco cessation  Hypertension -Continue metoprolol  Depression Continue sertraline  DVT prophylaxis: Lovenox Code Status: DNR Family Communication: None at bedside Disposition Plan: Status is: Observation The patient will require care spanning > 2 midnights and should be moved to inpatient because: Of severity of illness.  Still requiring supplemental oxygen and IV Solu-Medrol    Consultants: None  Procedures: None  Antimicrobials:  Anti-infectives (From admission, onward)    Start     Dose/Rate Route Frequency Ordered Stop   06/13/22 1200  ceFEPIme (MAXIPIME) 2 g in sodium chloride 0.9 % 100 mL IVPB        2 g 200 mL/hr over 30 Minutes Intravenous Every 8 hours 06/13/22 1008 06/18/22 1159   06/13/22 0600  azithromycin (ZITHROMAX) tablet 500 mg        500 mg Oral  Once 06/13/22 0557 06/13/22 0602        Subjective: Patient seen and examined at bedside.  Still coughing intermittently  with shortness of breath with minimal exertion.  Not ready to go home today.  No fever, vomiting, chest pain reported.  Objective: Vitals:   06/14/22 0521 06/14/22 0841 06/14/22 0904 06/14/22 0907  BP: 119/63 114/66    Pulse: 74 80 83   Resp: 18 19 17    Temp: 98.3 F (36.8 C) 97.7 F (36.5 C)    TempSrc:  Oral    SpO2: 93% 95% 94% 95%  Weight:      Height:        Intake/Output Summary (Last 24 hours) at 06/14/2022 0956 Last data filed at 06/14/2022 0841 Gross per 24 hour  Intake 1428.24 ml  Output 325 ml  Net 1103.24 ml   Filed Weights   06/13/22 0317 06/13/22 1531  Weight: 81.6 kg 78.9 kg    Examination:  General exam: Appears calm and comfortable.  Currently on 3 L oxygen via nasal cannula. Respiratory system: Bilateral decreased breath sounds at bases with some scattered crackles and wheezing Cardiovascular system: S1 & S2 heard, Rate controlled Gastrointestinal system: Abdomen is nondistended, soft and nontender. Normal bowel sounds heard. Extremities: No cyanosis, clubbing, edema  Central nervous system: Alert and oriented. No focal neurological deficits. Moving extremities Skin: No rashes, lesions or ulcers Psychiatry: Flat affect.  Not agitated.    Data Reviewed: I have personally reviewed following labs and imaging studies  CBC: Recent Labs  Lab 06/08/22 0520 06/12/22 1949 06/14/22 0302  WBC 4.2 7.7 8.1  NEUTROABS 2.5 5.7  --   HGB 14.5 15.4* 15.0  HCT 43.7 45.6 45.1  MCV 99.1 99.1 100.2*  PLT 113* 164 825   Basic Metabolic Panel: Recent Labs  Lab 06/08/22 0520 06/12/22 1949 06/14/22 0302  NA 142 141 141  K 3.3* 3.3* 5.1  CL 109 103 106  CO2 27 25 28   GLUCOSE 100* 90 130*  BUN 17 14 17   CREATININE 0.54 0.66 0.64  CALCIUM 8.5* 9.0 9.2   GFR: Estimated Creatinine Clearance: 78.2 mL/min (by C-G formula based on SCr of 0.64 mg/dL). Liver Function Tests: No results for input(s): "AST", "ALT", "ALKPHOS", "BILITOT", "PROT", "ALBUMIN" in the  last 168 hours. No results for input(s): "LIPASE", "AMYLASE" in the last 168 hours. No results for input(s): "AMMONIA" in the last 168 hours. Coagulation Profile: No results for input(s): "INR", "PROTIME" in the last 168 hours. Cardiac Enzymes: No results for input(s): "CKTOTAL", "CKMB", "CKMBINDEX", "TROPONINI" in the last 168 hours. BNP (last 3 results) No results for input(s): "PROBNP" in the last 8760 hours. HbA1C: No results for input(s): "HGBA1C" in the last 72 hours. CBG: No results for input(s): "GLUCAP" in the last 168 hours. Lipid Profile: No results for input(s): "CHOL", "HDL", "LDLCALC", "TRIG", "CHOLHDL", "LDLDIRECT" in the last 72 hours. Thyroid Function Tests: No results for input(s): "TSH", "T4TOTAL", "FREET4", "T3FREE", "THYROIDAB" in the last 72 hours. Anemia Panel: No results for input(s): "VITAMINB12", "FOLATE", "FERRITIN", "TIBC", "IRON", "RETICCTPCT" in the last 72 hours. Sepsis Labs: No results for input(s): "PROCALCITON", "LATICACIDVEN" in the last 168 hours.  No results found for this or any previous visit (from the past 240 hour(s)).       Radiology Studies: DG Chest 2 View  Result Date: 06/12/2022 CLINICAL DATA:  Dyspnea, COPD exacerbation EXAM: CHEST - 2 VIEW COMPARISON:  06/08/2022 FINDINGS: The lungs are hyperinflated in keeping with changes of underlying COPD. No confluent pulmonary infiltrate. Stable nodule within the right mid lung zone corresponding to a pulmonary nodule noted on CT examination of 05/30/2021. No pneumothorax or pleural effusion. Aortic valve replacement has been performed. Cardiac size within normal limits. Pulmonary vascularity is normal. Interval development of age-indeterminate compression deformities of T8, T9, and T10. Stable superior endplate fracture of K53. IMPRESSION: 1. No active cardiopulmonary disease. COPD. 2. Interval development of age-indeterminate compression deformities of T8, T9, and T10. Electronically Signed   By:  Fidela Salisbury M.D.   On: 06/12/2022 20:28        Scheduled Meds:  aspirin EC  325 mg Oral Daily   docusate sodium  100 mg Oral BID   enoxaparin (LOVENOX) injection  40 mg Subcutaneous Q24H   fluticasone furoate-vilanterol  1 puff Inhalation BID   ipratropium-albuterol  3 mL Nebulization Q6H   methylPREDNISolone (SOLU-MEDROL) injection  80 mg Intravenous Daily   metoprolol tartrate  25 mg Oral BID   sertraline  25 mg Oral Daily   sodium chloride flush  3 mL Intravenous Q12H   umeclidinium bromide  1 puff Inhalation Daily   Continuous Infusions:  ceFEPime (MAXIPIME) IV 2 g (06/14/22 0434)          Aline August, MD [facility] 06/14/2022, 9:56 AM

## 2022-06-14 NOTE — Progress Notes (Signed)
Initial Nutrition Assessment  DOCUMENTATION CODES:   Not applicable  INTERVENTION:  Continue regular diet as ordered Encourage adequate PO intake with smaller more frequent meals as able and tolerated Ensure Enlive po BID, each supplement provides 350 kcal and 20 grams of protein.  NUTRITION DIAGNOSIS:   Inadequate oral intake related to decreased appetite as evidenced by per patient/family report.  GOAL:   Patient will meet greater than or equal to 90% of their needs  MONITOR:   PO intake, Supplement acceptance, Labs, Weight trends  REASON FOR ASSESSMENT:   Consult Other (Comment) (nutrition goals)  ASSESSMENT:   Pt admitted with SOB r/t COPD exacerbation. PMH significant for tobacco use, AS s/p aortic replacement.  Spoke with pt via phone call to room. She reports that her PO intake within the last week has decreased to about 50% of her usual intake d/t difficulty breathing. She states that she will get a meal but only picks at it. Unable to provide a detailed dietary recall. However she reports that she is beginning to feel better.   She denies difficulty chewing or swallowing foods and endorses regular bowel movements. She is not currently consuming nutrition supplements but is agreeable to receiving them during admission to augment her nutritional intake.   Meal completions: 11/4: 75% breakfast  Reviewed weight history. Within the last year her weight has increased from 70.3 kg to 81.6 kg. Within the last week, it appears she has had a 3.3% weight loss which is significant for time frame.   Medications: zithromax, colace, solu-medrol  Labs reviewed   NUTRITION - FOCUSED PHYSICAL EXAM: RD working remotely. Deferred to follow up.   Diet Order:   Diet Order             Diet regular Room service appropriate? Yes; Fluid consistency: Thin  Diet effective now                   EDUCATION NEEDS:   Education needs have been addressed  Skin:  Skin  Assessment: Reviewed RN Assessment  Last BM:  11/3  Height:   Ht Readings from Last 1 Encounters:  06/13/22 5\' 9"  (1.753 m)    Weight:   Wt Readings from Last 1 Encounters:  06/13/22 78.9 kg   BMI:  Body mass index is 25.69 kg/m.  Estimated Nutritional Needs:   Kcal:  1800-2000  Protein:  90-105g  Fluid:  >/=1.8L  Clayborne Dana, RDN, LDN Clinical Nutrition

## 2022-06-14 NOTE — Evaluation (Signed)
Occupational Therapy Evaluation Patient Details Name: Crystal Silva MRN: [id number] DOB: Aug 09, 1961 Today's Date: 06/14/2022   History of Present Illness 61 year old female presents to the ER because of shortness of breath; Admitted for COPD exacerbation;  with known history of COPD and aortic stenosis status post aortic replacement   Clinical Impression   Pt reports PTA, she was independent with ADL/IADL and functional mobility. Pt currently requires supervision for functional mobility with toileting and grooming at sink level. Pt currently requires 4lnc with exertion and at rest to maintain SpO2 >90%. Pt tolerating short distance ambulation before needing a seated rest break.  Pt limited by decreased activity tolerance, decreased endurance and new need for supplemental O2. Will continue to follow acutely and progress appropriately. Recommend home at d/c with follow up therapy at pulmonary rehab.      Recommendations for follow up therapy are one component of a multi-disciplinary discharge planning process, led by the attending physician.  Recommendations may be updated based on patient status, additional functional criteria and insurance authorization.   Follow Up Recommendations  Other (comment) (pulmonary rehab)    Assistance Recommended at Discharge PRN  Patient can return home with the following      Functional Status Assessment  Patient has had a recent decline in their functional status and demonstrates the ability to make significant improvements in function in a reasonable and predictable amount of time.  Equipment Recommendations  None recommended by OT    Recommendations for Other Services       Precautions / Restrictions Precautions Precautions: Fall      Mobility Bed Mobility               General bed mobility comments: not assessed, pt received in recliner and left in recliner    Transfers Overall transfer level: Needs assistance Equipment used:  None Transfers: Sit to/from Stand Sit to Stand: Supervision           General transfer comment: supervision for safety and line management, pt tolerating short distances      Balance Overall balance assessment: Mild deficits observed, not formally tested                                         ADL either performed or assessed with clinical judgement   ADL Overall ADL's : Needs assistance/impaired Eating/Feeding: Independent   Grooming: Supervision/safety;Standing   Upper Body Bathing: Supervision/ safety   Lower Body Bathing: Supervison/ safety   Upper Body Dressing : Independent   Lower Body Dressing: Supervision/safety Lower Body Dressing Details (indicate cue type and reason): discussed use of figure-4 for energy conservation with LB dressing Toilet Transfer: Supervision/safety;Ambulation Toilet Transfer Details (indicate cue type and reason): short distance from recliner to BSC ~66feet Toileting- Clothing Manipulation and Hygiene: Supervision/safety;Sit to/from stand       Functional mobility during ADLs: Supervision/safety General ADL Comments: pt limited by O2 needs, decreased activity tolerance, decreased endurance     Vision         Perception     Praxis      Pertinent Vitals/Pain Pain Assessment Pain Assessment: No/denies pain     Hand Dominance Right   Extremity/Trunk Assessment Upper Extremity Assessment Upper Extremity Assessment: Overall WFL for tasks assessed   Lower Extremity Assessment Lower Extremity Assessment: Defer to PT evaluation   Cervical / Trunk Assessment Cervical / Trunk Assessment: Normal  Communication Communication Communication: No difficulties   Cognition Arousal/Alertness: Awake/alert Behavior During Therapy: WFL for tasks assessed/performed Overall Cognitive Status: Within Functional Limits for tasks assessed                                       General Comments  HR 108bpm  with exertion, DoE 2/4 with short distance exertion    Exercises     Shoulder Instructions      Home Living Family/patient expects to be discharged to:: Private residence Living Arrangements: Other relatives   Type of Home: House Home Access: Stairs to enter Secretary/administrator of Steps: "a few"   Home Layout: One level     Bathroom Shower/Tub: Runner, broadcasting/film/video: Shower seat   Additional Comments: pt reports her 9y.o grandson lives with her      Prior Functioning/Environment Prior Level of Function : Independent/Modified Independent             Mobility Comments: no AD, no home O2 needs ADLs Comments: pt was working at [employer], was driving and independent with all ADL/IADL        OT Problem List: Cardiopulmonary status limiting activity;Decreased activity tolerance      OT Treatment/Interventions: Self-care/ADL training;Therapeutic exercise;Energy conservation;DME and/or AE instruction;Patient/family education    OT Goals(Current goals can be found in the care plan section) Acute Rehab OT Goals Patient Stated Goal: to get off O2 and back to baseline OT Goal Formulation: With patient Time For Goal Achievement: 06/28/22 Potential to Achieve Goals: Good ADL Goals Additional ADL Goal #1: Pt will demonstrate independence with 3 energy conservation strategies during ADL/IADL. Additional ADL Goal #2: Pt will complete OOB ADL/IADL for with appropriate level of rest breaks and SpO2 >92%  OT Frequency: Min 2X/week    Co-evaluation              AM-PAC OT "6 Clicks" Daily Activity     Outcome Measure Help from another person eating meals?: None Help from another person taking care of personal grooming?: A Little Help from another person toileting, which includes using toliet, bedpan, or urinal?: A Little Help from another person bathing (including washing, rinsing, drying)?: A Little Help from another person to put on and  taking off regular upper body clothing?: None Help from another person to put on and taking off regular lower body clothing?: A Little 6 Click Score: 20   End of Session Equipment Utilized During Treatment: Oxygen Nurse Communication: Mobility status  Activity Tolerance: Patient tolerated treatment well Patient left: in chair;with call bell/phone within reach  OT Visit Diagnosis: Other abnormalities of gait and mobility (R26.89)                Time: 2725-3664 OT Time Calculation (min): 14 min Charges:  OT General Charges $OT Visit: 1 Visit OT Evaluation $OT Eval Low Complexity: 1 Low  Mariyah Upshaw OTR/L Acute Rehabilitation Services Office: [phone]   Rebeca Alert 06/14/2022, 9:48 AM

## 2022-06-14 NOTE — Progress Notes (Signed)
Physical Therapy Treatment Note  (Full PT eval to follow)  SATURATION QUALIFICATIONS: (This note is used to comply with regulatory documentation for home oxygen)  Patient Saturations on Room Air at Rest = 88%  Patient Saturations on Room Air while Ambulating = 86%  Patient Saturations on 4 Liters of oxygen while Ambulating = 90%  Please briefly explain why patient needs home oxygen: Patient requires supplemental oxygen to maintain oxygen saturations at acceptable, safe levels with physical activity.  Roney Marion, [place] Office [phone]

## 2022-06-14 NOTE — Evaluation (Signed)
Physical Therapy Evaluation Patient Details Name: Crystal Silva MRN: [id number] DOB: 08-Apr-1961 Today's Date: 06/14/2022  History of Present Illness  61 year old female presents to the ER because of shortness of breath; Admitted for COPD exacerbation;  with known history of COPD and aortic stenosis status post aortic replacement  Clinical Impression   Pt admitted with above diagnosis. Lives at home ( and cares for 9yo grandson), in a single-level home with "a few" steps to enter; Prior to admission; pt independent, driving, working, caring for grandson; Presents to PT with decr activity tolerance, signficantly decr endurance and decr functional capacity effecting independence and safety with functional mobility; Walked in room on [employer] with noted O2 sat drop; Patient requires supplemental oxygen to maintain oxygen saturations at acceptable, safe levels with physical activity; Overall supervision/min assist for mobility at this time; Once seated, pt reports she recovered more quickly today than she has in the past few weeks -- which is promising; I anticipate good functional progress as she improves medically;  Pt currently with functional limitations due to the deficits listed below (see PT Problem List). Pt will benefit from skilled PT to increase their independence and safety with mobility to allow discharge to the venue listed below.          Recommendations for follow up therapy are one component of a multi-disciplinary discharge planning process, led by the attending physician.  Recommendations may be updated based on patient status, additional functional criteria and insurance authorization.  Follow Up Recommendations No PT follow up      Assistance Recommended at Discharge PRN  Patient can return home with the following  Assistance with cooking/housework    Equipment Recommendations Rollator (4 wheels) (Oxygen)  Recommendations for Other Services       Functional Status  Assessment Patient has had a recent decline in their functional status and demonstrates the ability to make significant improvements in function in a reasonable and predictable amount of time.     Precautions / Restrictions Precautions Precautions: Fall      Mobility  Bed Mobility Overal bed mobility: Needs Assistance Bed Mobility: Supine to Sit     Supine to sit: Supervision, HOB elevated     General bed mobility comments: Cues to self-monitor for activity tolerance    Transfers Overall transfer level: Needs assistance Equipment used: None Transfers: Sit to/from Stand Sit to Stand: Supervision           General transfer comment: supervision for safety and line management; noting incr use of UEs to push up and stabilize    Ambulation/Gait Ambulation/Gait assistance: Min guard (without physical assist) Gait Distance (Feet): 15 Feet Assistive device: None (reaching out for furniture) Gait Pattern/deviations: Decreased step length - right, Decreased step length - left Gait velocity: quite slowed     General Gait Details: Tending to reach out for UE support (to appropriate surfaces like foot of bed, counter at sink); DOE 3/4  Stairs            Wheelchair Mobility    Modified Rankin (Stroke Patients Only)       Balance Overall balance assessment: Mild deficits observed, not formally tested                                           Pertinent Vitals/Pain Pain Assessment Pain Assessment: No/denies pain    Home Living Family/patient expects  to be discharged to:: Private residence Living Arrangements: Other relatives   Type of Home: House Home Access: Stairs to enter   Technical brewer of Steps: "a few"   Home Layout: One level Home Equipment: Shower seat Additional Comments: pt reports her 9y.o grandson lives with her    Prior Function Prior Level of Function : Independent/Modified Independent             Mobility  Comments: no AD, no home O2 needs ADLs Comments: pt was working at [employer], was driving and independent with all ADL/IADL     Hand Dominance   Dominant Hand: Right    Extremity/Trunk Assessment   Upper Extremity Assessment Upper Extremity Assessment: Defer to OT evaluation    Lower Extremity Assessment Lower Extremity Assessment: Generalized weakness    Cervical / Trunk Assessment Cervical / Trunk Assessment: Normal  Communication   Communication: No difficulties  Cognition Arousal/Alertness: Awake/alert Behavior During Therapy: WFL for tasks assessed/performed Overall Cognitive Status: Within Functional Limits for tasks assessed                                          General Comments General comments (skin integrity, edema, etc.): O2 sat drop with amb on room air; see other PT note of this date for O2 qualifying walk    Exercises     Assessment/Plan    PT Assessment Patient needs continued PT services  PT Problem List Decreased strength;Decreased activity tolerance;Decreased balance;Decreased knowledge of use of DME;Cardiopulmonary status limiting activity;Decreased knowledge of precautions       PT Treatment Interventions DME instruction;Gait training;Stair training;Functional mobility training;Therapeutic activities;Therapeutic exercise;Balance training;Patient/family education    PT Goals (Current goals can be found in the Care Plan section)  Acute Rehab PT Goals Patient Stated Goal: Be able to walk without getting short of breath PT Goal Formulation: With patient Time For Goal Achievement: 06/28/22 Potential to Achieve Goals: Good    Frequency Min 3X/week     Co-evaluation               AM-PAC PT "6 Clicks" Mobility  Outcome Measure Help needed turning from your back to your side while in a flat bed without using bedrails?: None Help needed moving from lying on your back to sitting on the side of a flat bed without  using bedrails?: None Help needed moving to and from a bed to a chair (including a wheelchair)?: A Little Help needed standing up from a chair using your arms (e.g., wheelchair or bedside chair)?: A Little Help needed to walk in hospital room?: A Little Help needed climbing 3-5 steps with a railing? : A Little 6 Click Score: 20    End of Session Equipment Utilized During Treatment: Oxygen Activity Tolerance: Other (comment) (Limited by dyspnea) Patient left: in chair;with call bell/phone within reach Nurse Communication: Mobility status PT Visit Diagnosis: Other abnormalities of gait and mobility (R26.89)    Time: 2637-8588 PT Time Calculation (min) (ACUTE ONLY): 22 min   Charges:   PT Evaluation $PT Eval Low Complexity: 1 Low          Roney Marion, [place] Office [place] 06/14/2022, 10:12 AM

## 2022-06-15 DIAGNOSIS — F17200 Nicotine dependence, unspecified, uncomplicated: Secondary | ICD-10-CM | POA: Diagnosis not present

## 2022-06-15 DIAGNOSIS — I10 Essential (primary) hypertension: Secondary | ICD-10-CM | POA: Diagnosis not present

## 2022-06-15 DIAGNOSIS — F32A Depression, unspecified: Secondary | ICD-10-CM | POA: Diagnosis not present

## 2022-06-15 DIAGNOSIS — J9601 Acute respiratory failure with hypoxia: Secondary | ICD-10-CM | POA: Insufficient documentation

## 2022-06-15 DIAGNOSIS — J441 Chronic obstructive pulmonary disease with (acute) exacerbation: Secondary | ICD-10-CM | POA: Diagnosis not present

## 2022-06-15 MED ORDER — METHYLPREDNISOLONE SODIUM SUCC 125 MG IJ SOLR
80.0000 mg | Freq: Two times a day (BID) | INTRAMUSCULAR | Status: DC
  Administered 2022-06-15 – 2022-06-17 (×4): 80 mg via INTRAVENOUS
  Filled 2022-06-15 (×4): qty 2

## 2022-06-15 MED ORDER — IPRATROPIUM-ALBUTEROL 0.5-2.5 (3) MG/3ML IN SOLN
3.0000 mL | Freq: Two times a day (BID) | RESPIRATORY_TRACT | Status: DC
  Administered 2022-06-16 – 2022-06-17 (×3): 3 mL via RESPIRATORY_TRACT
  Filled 2022-06-15 (×3): qty 3

## 2022-06-15 NOTE — Progress Notes (Addendum)
PROGRESS NOTE    Crystal Silva  ZOX:[id number] DOB: March 10, 1961 DOA: 06/12/2022 PCP: Celene Squibb, MD   Brief Narrative:  61 year old female with history of tobacco abuse, probable COPD, aortic stenosis status post aortic replacement presented with worsening shortness of breath.  She required supplemental oxygen on presentation; chest x-ray was negative for infiltrates.  She was started on IV Solu-Medrol.  Assessment & Plan:   Acute respiratory failure with hypoxia Probable COPD exacerbation Ongoing tobacco abuse -Currently still requiring 3 L oxygen via nasal cannula.  Does not wear oxygen at home.  Wean off as able. -No formal diagnosis of COPD.  Will need outpatient PFTs. -Will possibly need supplemental oxygen on discharge -Chest x-ray negative for infiltrates.  Continue Zithromax. - influenza and COVID-19 negative. -Still very short of breath with minimal exertion.  Increase his Solu-Medrol to 80 mg IV every 12 hours.  Continue current nebs and inhaler.  Outpatient evaluation and follow-up by pulmonary -Counseled regarding tobacco cessation  Hypertension -Continue metoprolol  Depression -Continue sertraline  DVT prophylaxis: Lovenox Code Status: DNR Family Communication: None at bedside Disposition Plan: Status is: inpatient because: Of severity of illness.  Still requiring supplemental oxygen and IV Solu-Medrol    Consultants: None  Procedures: None  Antimicrobials:  Anti-infectives (From admission, onward)    Start     Dose/Rate Route Frequency Ordered Stop   06/14/22 1100  azithromycin (ZITHROMAX) tablet 500 mg        500 mg Oral Daily 06/14/22 1002     06/13/22 1200  ceFEPIme (MAXIPIME) 2 g in sodium chloride 0.9 % 100 mL IVPB  Status:  Discontinued        2 g 200 mL/hr over 30 Minutes Intravenous Every 8 hours 06/13/22 1008 06/14/22 1002   06/13/22 0600  azithromycin (ZITHROMAX) tablet 500 mg        500 mg Oral  Once 06/13/22 0557 06/13/22 0602         Subjective: Patient seen and examined at bedside.  Still feels extremely short of breath with minimal exertion.  Does not feel ready to go home today as well.  Still has intermittent cough.  No fever, chest pain reported. Objective: Vitals:   06/14/22 2142 06/15/22 0119 06/15/22 0530 06/15/22 0935  BP: (!) 114/96  (!) 123/105 110/60  Pulse: 72 80 95 79  Resp: 19 16 19    Temp: 97.9 F (36.6 C)  98.3 F (36.8 C) 98.1 F (36.7 C)  TempSrc:    Temporal  SpO2: 95% 96% 90% 94%  Weight:      Height:        Intake/Output Summary (Last 24 hours) at 06/15/2022 1035 Last data filed at 06/15/2022 0900 Gross per 24 hour  Intake 960 ml  Output 0 ml  Net 960 ml    Filed Weights   06/13/22 0317 06/13/22 1531  Weight: 81.6 kg 78.9 kg    Examination:  General: On 3 L oxygen via nasal cannula.  No distress.  Looks chronically ill and deconditioned. respiratory: Decreased breath sounds at bases bilaterally with some crackles and wheezing CVS: Currently rate controlled; S1-S2 heard  abdominal: Soft, nontender, slightly distended, no organomegaly; bowel sounds heard normally  extremities: Trace lower extremity edema; no clubbing.       Data Reviewed: I have personally reviewed following labs and imaging studies  CBC: Recent Labs  Lab 06/12/22 1949 06/14/22 0302  WBC 7.7 8.1  NEUTROABS 5.7  --   HGB 15.4* 15.0  HCT  45.6 45.1  MCV 99.1 100.2*  PLT 164 168    Basic Metabolic Panel: Recent Labs  Lab 06/12/22 1949 06/14/22 0302  NA 141 141  K 3.3* 5.1  CL 103 106  CO2 25 28  GLUCOSE 90 130*  BUN 14 17  CREATININE 0.66 0.64  CALCIUM 9.0 9.2    GFR: Estimated Creatinine Clearance: 78.2 mL/min (by C-G formula based on SCr of 0.64 mg/dL). Liver Function Tests: No results for input(s): "AST", "ALT", "ALKPHOS", "BILITOT", "PROT", "ALBUMIN" in the last 168 hours. No results for input(s): "LIPASE", "AMYLASE" in the last 168 hours. No results for input(s): "AMMONIA" in  the last 168 hours. Coagulation Profile: No results for input(s): "INR", "PROTIME" in the last 168 hours. Cardiac Enzymes: No results for input(s): "CKTOTAL", "CKMB", "CKMBINDEX", "TROPONINI" in the last 168 hours. BNP (last 3 results) No results for input(s): "PROBNP" in the last 8760 hours. HbA1C: No results for input(s): "HGBA1C" in the last 72 hours. CBG: No results for input(s): "GLUCAP" in the last 168 hours. Lipid Profile: No results for input(s): "CHOL", "HDL", "LDLCALC", "TRIG", "CHOLHDL", "LDLDIRECT" in the last 72 hours. Thyroid Function Tests: No results for input(s): "TSH", "T4TOTAL", "FREET4", "T3FREE", "THYROIDAB" in the last 72 hours. Anemia Panel: No results for input(s): "VITAMINB12", "FOLATE", "FERRITIN", "TIBC", "IRON", "RETICCTPCT" in the last 72 hours. Sepsis Labs: No results for input(s): "PROCALCITON", "LATICACIDVEN" in the last 168 hours.  Recent Results (from the past 240 hour(s))  Resp Panel by RT-PCR (Flu A&B, Covid) Anterior Nasal Swab     Status: None   Collection Time: 06/14/22 10:57 AM   Specimen: Anterior Nasal Swab  Result Value Ref Range Status   SARS Coronavirus 2 by RT PCR NEGATIVE NEGATIVE Final    Comment: (NOTE) SARS-CoV-2 target nucleic acids are NOT DETECTED.  The SARS-CoV-2 RNA is generally detectable in upper respiratory specimens during the acute phase of infection. The lowest concentration of SARS-CoV-2 viral copies this assay can detect is 138 copies/mL. A negative result does not preclude SARS-Cov-2 infection and should not be used as the sole basis for treatment or other patient management decisions. A negative result may occur with  improper specimen collection/handling, submission of specimen other than nasopharyngeal swab, presence of viral mutation(s) within the areas targeted by this assay, and inadequate number of viral copies(<138 copies/mL). A negative result must be combined with clinical observations, patient history,  and epidemiological information. The expected result is Negative.  Fact Sheet for Patients:  BloggerCourse.com  Fact Sheet for Healthcare Providers:  SeriousBroker.it  This test is no t yet approved or cleared by the [place] FDA and  has been authorized for detection and/or diagnosis of SARS-CoV-2 by FDA under an Emergency Use Authorization (EUA). This EUA will remain  in effect (meaning this test can be used) for the duration of the COVID-19 declaration under Section 564(b)(1) of the Act, 21 U.S.C.section 360bbb-3(b)(1), unless the authorization is terminated  or revoked sooner.       Influenza A by PCR NEGATIVE NEGATIVE Final   Influenza B by PCR NEGATIVE NEGATIVE Final    Comment: (NOTE) The Xpert Xpress SARS-CoV-2/FLU/RSV plus assay is intended as an aid in the diagnosis of influenza from Nasopharyngeal swab specimens and should not be used as a sole basis for treatment. Nasal washings and aspirates are unacceptable for Xpert Xpress SARS-CoV-2/FLU/RSV testing.  Fact Sheet for Patients: BloggerCourse.com  Fact Sheet for Healthcare Providers: SeriousBroker.it  This test is not yet approved or cleared by the [place] FDA and has been authorized for detection and/or diagnosis of SARS-CoV-2 by FDA under an Emergency Use Authorization (EUA). This EUA will remain in effect (meaning this test can be used) for the duration of the COVID-19 declaration under Section 564(b)(1) of the Act, 21 U.S.C. section 360bbb-3(b)(1), unless the authorization is terminated or revoked.  Performed at [facility], [address] [place], [place] [address]          Radiology Studies: No results found.      Scheduled Meds:  aspirin EC  325 mg Oral Daily   azithromycin  500 mg Oral Daily   docusate sodium  100 mg Oral BID   enoxaparin (LOVENOX) injection  40 mg  Subcutaneous Q24H   feeding supplement  237 mL Oral BID BM   fluticasone furoate-vilanterol  1 puff Inhalation Daily   ipratropium-albuterol  3 mL Nebulization Q6H   methylPREDNISolone (SOLU-MEDROL) injection  80 mg Intravenous Daily   metoprolol tartrate  25 mg Oral BID   sertraline  25 mg Oral Daily   sodium chloride flush  3 mL Intravenous Q12H   umeclidinium bromide  1 puff Inhalation Daily   Continuous Infusions:          Glade [name], MD [facility] 06/15/2022, 10:35 AM

## 2022-06-15 NOTE — Progress Notes (Signed)
Physical Therapy Treatment Patient Details Name: Crystal Silva MRN: [id number] DOB: 05-01-1961 Today's Date: 06/15/2022   History of Present Illness 61 year old female presents to the ER because of shortness of breath; Admitted for COPD exacerbation;  with known history of COPD and aortic stenosis status post aortic replacement    PT Comments    Pt progressing towards physical therapy goals. Pt on 3L/min supplemental O2 upon entry, and RA throughout session. O2 sats >90% for ~75' of ambulation. Last 25' sats dropped as low as 86%. Pt with 4/4 DOE and spent several minutes attempting to recover while standing. She eventually required urgent seated rest as she was unable to speak and gasping for air. Sats recovered quickly >90% however pt required several minutes to slow her breathing. When she was ready, pt able to ambulate back to the room with less difficulty and sats dropping to 87%. MD present at end of session. Will continue to follow and progress as able per POC.    Recommendations for follow up therapy are one component of a multi-disciplinary discharge planning process, led by the attending physician.  Recommendations may be updated based on patient status, additional functional criteria and insurance authorization.  Follow Up Recommendations  No PT follow up     Assistance Recommended at Discharge PRN  Patient can return home with the following Assistance with cooking/housework;Assist for transportation   Equipment Recommendations  Rollator (4 wheels) (Oxygen)    Recommendations for Other Services       Precautions / Restrictions Precautions Precautions: Fall     Mobility  Bed Mobility               General bed mobility comments: Pt was received sitting up EOB    Transfers Overall transfer level: Needs assistance Equipment used: None Transfers: Sit to/from Stand Sit to Stand: Supervision           General transfer comment: Light supervision for safety.  No assist required to power up to full stand.    Ambulation/Gait Ambulation/Gait assistance: Min guard Gait Distance (Feet): 100 Feet (100', seated rest, 100') Assistive device: None (Railing in hall occasionally) Gait Pattern/deviations: Decreased step length - right, Decreased step length - left Gait velocity: Decreased Gait velocity interpretation: 1.31 - 2.62 ft/sec, indicative of limited community ambulator   General Gait Details: Occasional reach out for railing in hall vs furniture. No overt LOB noted. Pt reports feeling wobbly. Pt ambulated on RA with O2 sats >90% for ~75'. Last 25' sats dropped as low as 86%. Pt with 4/4 DOE and required urgent seated rest as she was unable to speak and gasping for air. Sats recovered quickly however pt required several minutes to slow breathing. When she was ready, pt able to ambulate back to the room with less difficulty.   Stairs             Wheelchair Mobility    Modified Rankin (Stroke Patients Only)       Balance Overall balance assessment: Mild deficits observed, not formally tested                                          Cognition Arousal/Alertness: Awake/alert Behavior During Therapy: WFL for tasks assessed/performed Overall Cognitive Status: Within Functional Limits for tasks assessed  Exercises      General Comments        Pertinent Vitals/Pain Pain Assessment Pain Assessment: No/denies pain    Home Living                          Prior Function            PT Goals (current goals can now be found in the care plan section) Acute Rehab PT Goals Patient Stated Goal: Be able to walk without getting short of breath PT Goal Formulation: With patient Time For Goal Achievement: 06/28/22 Potential to Achieve Goals: Good Progress towards PT goals: Progressing toward goals    Frequency    Min 3X/week      PT Plan  Current plan remains appropriate    Co-evaluation              AM-PAC PT "6 Clicks" Mobility   Outcome Measure  Help needed turning from your back to your side while in a flat bed without using bedrails?: None Help needed moving from lying on your back to sitting on the side of a flat bed without using bedrails?: None Help needed moving to and from a bed to a chair (including a wheelchair)?: A Little Help needed standing up from a chair using your arms (e.g., wheelchair or bedside chair)?: A Little Help needed to walk in hospital room?: A Little Help needed climbing 3-5 steps with a railing? : A Little 6 Click Score: 20    End of Session Equipment Utilized During Treatment: Oxygen Activity Tolerance: Other (comment) (Limited by dyspnea) Patient left: in chair;with call bell/phone within reach Nurse Communication: Mobility status PT Visit Diagnosis: Other abnormalities of gait and mobility (R26.89)     Time: 3329-5188 PT Time Calculation (min) (ACUTE ONLY): 23 min  Charges:  $Gait Training: 23-37 mins                     Rolinda Roan, PT, DPT Acute Rehabilitation Services Secure Chat Preferred Office: [phone]    Thelma Comp 06/15/2022, 9:50 AM

## 2022-06-15 NOTE — Progress Notes (Signed)
Occupational Therapy Treatment Patient Details Name: Crystal Silva MRN: [id number] DOB: 1961/01/12 Today's Date: 06/15/2022   History of present illness 61 year old female presents to the ER because of shortness of breath; Admitted for COPD exacerbation;  with known history of COPD and aortic stenosis status post aortic replacement   OT comments  Pt progressing towards goals this session, needing supervision for seated and standing ADLs. Pt able to walk short hallway distance on 3L O2, SpO2 dropping to 89% at lowest, but quickly recovering to 94%. Pt provided energy conservation handout and reviewed strategies for home, pt verbalized understanding. Pt presenting with impairments listed below, will follow acutely to maximize safety and independence with ADLs and functional mobility.   Recommendations for follow up therapy are one component of a multi-disciplinary discharge planning process, led by the attending physician.  Recommendations may be updated based on patient status, additional functional criteria and insurance authorization.    Follow Up Recommendations  Other (comment) (pulmonary rehab)    Assistance Recommended at Discharge PRN  Patient can return home with the following      Equipment Recommendations  None recommended by OT    Recommendations for Other Services PT consult    Precautions / Restrictions Precautions Precautions: Fall Restrictions Weight Bearing Restrictions: No       Mobility Bed Mobility               General bed mobility comments: pt up in chair upon arrival    Transfers Overall transfer level: Needs assistance Equipment used: None Transfers: Sit to/from Stand Sit to Stand: Supervision                 Balance Overall balance assessment: Mild deficits observed, not formally tested                                         ADL either performed or assessed with clinical judgement   ADL Overall ADL's : Needs  assistance/impaired Eating/Feeding: Independent   Grooming: Supervision/safety;Standing;Brushing hair Grooming Details (indicate cue type and reason): brushing hair seateed                 Toilet Transfer: Supervision/safety;Ambulation Toilet Transfer Details (indicate cue type and reason): short distance from recliner to [facility] ~8feet         Functional mobility during ADLs: Supervision/safety      Extremity/Trunk Assessment Upper Extremity Assessment Upper Extremity Assessment: Generalized weakness   Lower Extremity Assessment Lower Extremity Assessment: Defer to PT evaluation        Vision   Vision Assessment?: No apparent visual deficits   Perception Perception Perception: Not tested   Praxis Praxis Praxis: Not tested    Cognition Arousal/Alertness: Awake/alert Behavior During Therapy: WFL for tasks assessed/performed Overall Cognitive Status: Within Functional Limits for tasks assessed                                          Exercises      Shoulder Instructions       General Comments SpO2 dropping to 89 on 3L after mobility    Pertinent Vitals/ Pain       Pain Assessment Pain Assessment: No/denies pain  Home Living  Prior Functioning/Environment              Frequency  Min 2X/week        Progress Toward Goals  OT Goals(current goals can now be found in the care plan section)  Progress towards OT goals: Progressing toward goals  Acute Rehab OT Goals Patient Stated Goal: none stated OT Goal Formulation: With patient Time For Goal Achievement: 06/28/22 Potential to Achieve Goals: Good ADL Goals Additional ADL Goal #1: Pt will demonstrate independence with 3 energy conservation strategies during ADL/IADL. Additional ADL Goal #2: Pt will complete OOB ADL/IADL for with appropriate level of rest breaks and SpO2 >92%  Plan Discharge plan remains  appropriate;Frequency remains appropriate    Co-evaluation                 AM-PAC OT "6 Clicks" Daily Activity     Outcome Measure   Help from another person eating meals?: None Help from another person taking care of personal grooming?: A Little Help from another person toileting, which includes using toliet, bedpan, or urinal?: A Little Help from another person bathing (including washing, rinsing, drying)?: A Little Help from another person to put on and taking off regular upper body clothing?: None Help from another person to put on and taking off regular lower body clothing?: A Little 6 Click Score: 20    End of Session Equipment Utilized During Treatment: Oxygen  OT Visit Diagnosis: Other abnormalities of gait and mobility (R26.89)   Activity Tolerance Patient tolerated treatment well   Patient Left in chair;with call bell/phone within reach   Nurse Communication Mobility status        Time: 7793-9030 OT Time Calculation (min): 21 min  Charges: OT General Charges $OT Visit: 1 Visit OT Treatments $Therapeutic Activity: 8-22 mins  Alfonzo Beers, OTD, OTR/L Acute Rehab [phone]   Mayer Masker 06/15/2022, 11:46 AM

## 2022-06-16 DIAGNOSIS — J9601 Acute respiratory failure with hypoxia: Secondary | ICD-10-CM | POA: Diagnosis not present

## 2022-06-16 DIAGNOSIS — I10 Essential (primary) hypertension: Secondary | ICD-10-CM | POA: Diagnosis not present

## 2022-06-16 DIAGNOSIS — F32A Depression, unspecified: Secondary | ICD-10-CM | POA: Diagnosis not present

## 2022-06-16 DIAGNOSIS — J441 Chronic obstructive pulmonary disease with (acute) exacerbation: Secondary | ICD-10-CM | POA: Diagnosis not present

## 2022-06-16 MED ORDER — ORAL CARE MOUTH RINSE: 15.0000 mL | OROMUCOSAL | Status: DC | PRN

## 2022-06-16 NOTE — Progress Notes (Signed)
Physical Therapy Treatment Patient Details Name: Crystal Silva MRN: [id number] DOB: Jun 30, 1961 Today's Date: 06/16/2022   History of Present Illness 61 year old female presents to the ER because of shortness of breath; Admitted for COPD exacerbation;  with known history of COPD and aortic stenosis status post aortic replacement    PT Comments    Continuing work on functional mobility and activity tolerance;  session focused on activity tolerance with progressive amb; Pt agreeing to get up for O2 qualifying walk even though she had been very recently walking with Mobility Specialist; Patient requires supplemental oxygen to maintain oxygen saturations at acceptable, safe levels with physical activity, but notable progress today, given two hallway walks in succession; and even with O2 sat drop, it is notable that she did not get as dyspneic as yesterday's session.  Please see other PT note of this date for O2 qualifying walk documentation  Recommendations for follow up therapy are one component of a multi-disciplinary discharge planning process, led by the attending physician.  Recommendations may be updated based on patient status, additional functional criteria and insurance authorization.  Follow Up Recommendations  No PT follow up     Assistance Recommended at Discharge PRN  Patient can return home with the following Assistance with cooking/housework;Assist for transportation   Equipment Recommendations  Rollator (4 wheels) (Oxygen)    Recommendations for Other Services       Precautions / Restrictions Precautions Precautions: Fall Precaution Comments: Fall risk is present, b;ut minimal     Mobility  Bed Mobility               General bed mobility comments: Essentially sitting EOB upon arrival    Transfers Overall transfer level: Needs assistance Equipment used: None Transfers: Sit to/from Stand Sit to Stand: Supervision           General transfer comment:  Light supervision for safety. No assist required to power up to full stand.    Ambulation/Gait Ambulation/Gait assistance: Min guard (without physical contact) Gait Distance (Feet): 150 Feet Assistive device:  (Pushed vitals machine) Gait Pattern/deviations: WFL(Within Functional Limits) (approaching) Gait velocity: Decreased     General Gait Details: PUshed vitals machine; Cues to self-monitor for activity tolerance-  No overt LOB noted. Pt reports feeling wobbly. Pt ambulated on RA with O2 sats >90% for ~60'. Then sats dropped as low as 86%. Walked back to room on 3-4 L supplemental O2   Stairs             Wheelchair Mobility    Modified Rankin (Stroke Patients Only)       Balance Overall balance assessment: Mild deficits observed, not formally tested                                          Cognition Arousal/Alertness: Awake/alert Behavior During Therapy: WFL for tasks assessed/performed Overall Cognitive Status: Within Functional Limits for tasks assessed                                          Exercises      General Comments General comments (skin integrity, edema, etc.): See other PT note of this date for O2 sat qualifying walk      Pertinent Vitals/Pain Pain Assessment Pain Assessment: No/denies pain    Home  Living                          Prior Function            PT Goals (current goals can now be found in the care plan section) Acute Rehab PT Goals Patient Stated Goal: Be able to walk without getting short of breath PT Goal Formulation: With patient Time For Goal Achievement: 06/28/22 Potential to Achieve Goals: Good Progress towards PT goals: Progressing toward goals    Frequency    Min 3X/week      PT Plan Current plan remains appropriate    Co-evaluation              AM-PAC PT "6 Clicks" Mobility   Outcome Measure  Help needed turning from your back to your side while in a  flat bed without using bedrails?: None Help needed moving from lying on your back to sitting on the side of a flat bed without using bedrails?: None Help needed moving to and from a bed to a chair (including a wheelchair)?: A Little Help needed standing up from a chair using your arms (e.g., wheelchair or bedside chair)?: A Little Help needed to walk in hospital room?: A Little Help needed climbing 3-5 steps with a railing? : A Little 6 Click Score: 20    End of Session Equipment Utilized During Treatment: Oxygen Activity Tolerance: Patient tolerated treatment well Patient left: in bed;with call bell/phone within reach Nurse Communication: Mobility status       Time: 1218-1228 PT Time Calculation (min) (ACUTE ONLY): 10 min  Charges:  $Gait Training: 8-22 mins                     Van Clines, PT  Acute Rehabilitation Services Office [phone]    Levi Aland 06/16/2022, 12:53 PM

## 2022-06-16 NOTE — Progress Notes (Signed)
Mobility Specialist Progress Note:   06/16/22 1135  Mobility  Activity Ambulated with assistance in hallway  Level of Assistance Standby assist, set-up cues, supervision of patient - no hands on  Assistive Device None  Distance Ambulated (ft) 200 ft  Activity Response Tolerated well  Mobility Referral Yes  $Mobility charge 1 Mobility   Pt agreeable to mobility session this am. Required only supervision for safety. Pt required 2LO2 to maintain SpO2 88-92% during ambulation. Pt back in bed with all needs met.   Nelta Numbers Acute Rehab Secure Chat or Office Phone: [phone]

## 2022-06-16 NOTE — Progress Notes (Signed)
Physical Therapy Treatment Note  (Full PT note to follow)  SATURATION QUALIFICATIONS: (This note is used to comply with regulatory documentation for home oxygen)  Patient Saturations on Room Air at Rest = 89%  Patient Saturations on Room Air while Ambulating = 86%  Patient Saturations on 3-4 Liters of oxygen while Ambulating = 93%  Please briefly explain why patient needs home oxygen: Patient requires supplemental oxygen to maintain oxygen saturations at acceptable, safe levels with physical activity.  Roney Marion, [place] Office [phone]

## 2022-06-16 NOTE — Progress Notes (Signed)
PROGRESS NOTE    ADRIENA Silva  AVW:[id number] DOB: [birth year] DOA: 06/12/2022 PCP: Benita Stabile, MD   Brief Narrative:  61 year old female with history of tobacco abuse, probable COPD, aortic stenosis status post aortic replacement presented with worsening shortness of breath.  She required supplemental oxygen on presentation; chest x-ray was negative for infiltrates.  She was started on IV Solu-Medrol.  Assessment & Plan:   Acute respiratory failure with hypoxia Probable COPD exacerbation Ongoing tobacco abuse -Currently still requiring 3 L oxygen via nasal cannula.  Does not wear oxygen at home.  Wean off as able. -No formal diagnosis of COPD.  Will need outpatient PFTs. -Will possibly need supplemental oxygen on discharge -Chest x-ray negative for infiltrates.  Continue Zithromax. - influenza and COVID-19 negative. -Feels slightly better but still very short of breath with some exertion.  Continue Solu-Medrol 80 mg IV every 12 hours for today.  Continue current nebs and inhaler.  Outpatient evaluation and follow-up by pulmonary -Counseled regarding tobacco cessation -Will possibly require supplemental oxygen on discharge.  Hypertension -Continue metoprolol  Depression -Continue sertraline  DVT prophylaxis: Lovenox Code Status: DNR Family Communication: None at bedside Disposition Plan: Status is: inpatient because: Of severity of illness.  Still requiring supplemental oxygen and IV Solu-Medrol    Consultants: None  Procedures: None  Antimicrobials:  Anti-infectives (From admission, onward)    Start     Dose/Rate Route Frequency Ordered Stop   06/14/22 1100  azithromycin (ZITHROMAX) tablet 500 mg        500 mg Oral Daily 06/14/22 1002     06/13/22 1200  ceFEPIme (MAXIPIME) 2 g in sodium chloride 0.9 % 100 mL IVPB  Status:  Discontinued        2 g 200 mL/hr over 30 Minutes Intravenous Every 8 hours 06/13/22 1008 06/14/22 1002   06/13/22 0600  azithromycin  (ZITHROMAX) tablet 500 mg        500 mg Oral  Once 06/13/22 0557 06/13/22 0602        Subjective: Patient seen and examined at bedside.  Feels slightly better since admission.  Still short of breath with exertion with intermittent cough.  Denies worsening abdominal pain, vomiting or fever. Objective: Vitals:   06/15/22 2117 06/16/22 0504 06/16/22 0749 06/16/22 0902  BP: 130/65 130/77  121/64  Pulse: 73 65 65 71  Resp: 18 19 20    Temp: 98.3 F (36.8 C) 98.2 F (36.8 C)  97.8 F (36.6 C)  TempSrc:    Oral  SpO2: 95% 97% 97% 97%  Weight:      Height:        Intake/Output Summary (Last 24 hours) at 06/16/2022 0933 Last data filed at 06/16/2022 0900 Gross per 24 hour  Intake 1420 ml  Output 0 ml  Net 1420 ml    Filed Weights   06/13/22 0317 06/13/22 1531  Weight: 81.6 kg 78.9 kg    Examination:  General: No acute distress.  Still on 3 L oxygen via nasal cannula.  Looks chronically ill and deconditioned. respiratory: Bilateral decreased breath sounds at bases with scattered wheezing, improving since yesterday.   CVS: S1 and S2 are heard; rate controlled currently abdominal: Soft, nontender, distended mildly tender no organomegaly; normal bowel sounds heard  extremities: No cyanosis; mild lower extremity edema present   Data Reviewed: I have personally reviewed following labs and imaging studies  CBC: Recent Labs  Lab 06/12/22 1949 06/14/22 0302  WBC 7.7 8.1  NEUTROABS 5.7  --  HGB 15.4* 15.0  HCT 45.6 45.1  MCV 99.1 100.2*  PLT 164 732    Basic Metabolic Panel: Recent Labs  Lab 06/12/22 1949 06/14/22 0302  NA 141 141  K 3.3* 5.1  CL 103 106  CO2 25 28  GLUCOSE 90 130*  BUN 14 17  CREATININE 0.66 0.64  CALCIUM 9.0 9.2    GFR: Estimated Creatinine Clearance: 78.2 mL/min (by C-G formula based on SCr of 0.64 mg/dL). Liver Function Tests: No results for input(s): "AST", "ALT", "ALKPHOS", "BILITOT", "PROT", "ALBUMIN" in the last 168 hours. No  results for input(s): "LIPASE", "AMYLASE" in the last 168 hours. No results for input(s): "AMMONIA" in the last 168 hours. Coagulation Profile: No results for input(s): "INR", "PROTIME" in the last 168 hours. Cardiac Enzymes: No results for input(s): "CKTOTAL", "CKMB", "CKMBINDEX", "TROPONINI" in the last 168 hours. BNP (last 3 results) No results for input(s): "PROBNP" in the last 8760 hours. HbA1C: No results for input(s): "HGBA1C" in the last 72 hours. CBG: No results for input(s): "GLUCAP" in the last 168 hours. Lipid Profile: No results for input(s): "CHOL", "HDL", "LDLCALC", "TRIG", "CHOLHDL", "LDLDIRECT" in the last 72 hours. Thyroid Function Tests: No results for input(s): "TSH", "T4TOTAL", "FREET4", "T3FREE", "THYROIDAB" in the last 72 hours. Anemia Panel: No results for input(s): "VITAMINB12", "FOLATE", "FERRITIN", "TIBC", "IRON", "RETICCTPCT" in the last 72 hours. Sepsis Labs: No results for input(s): "PROCALCITON", "LATICACIDVEN" in the last 168 hours.  Recent Results (from the past 240 hour(s))  Resp Panel by RT-PCR (Flu A&B, Covid) Anterior Nasal Swab     Status: None   Collection Time: 06/14/22 10:57 AM   Specimen: Anterior Nasal Swab  Result Value Ref Range Status   SARS Coronavirus 2 by RT PCR NEGATIVE NEGATIVE Final    Comment: (NOTE) SARS-CoV-2 target nucleic acids are NOT DETECTED.  The SARS-CoV-2 RNA is generally detectable in upper respiratory specimens during the acute phase of infection. The lowest concentration of SARS-CoV-2 viral copies this assay can detect is 138 copies/mL. A negative result does not preclude SARS-Cov-2 infection and should not be used as the sole basis for treatment or other patient management decisions. A negative result may occur with  improper specimen collection/handling, submission of specimen other than nasopharyngeal swab, presence of viral mutation(s) within the areas targeted by this assay, and inadequate number of  viral copies(<138 copies/mL). A negative result must be combined with clinical observations, patient history, and epidemiological information. The expected result is Negative.  Fact Sheet for Patients:  EntrepreneurPulse.com.au  Fact Sheet for Healthcare Providers:  IncredibleEmployment.be  This test is no t yet approved or cleared by the [place] FDA and  has been authorized for detection and/or diagnosis of SARS-CoV-2 by FDA under an Emergency Use Authorization (EUA). This EUA will remain  in effect (meaning this test can be used) for the duration of the COVID-19 declaration under Section 564(b)(1) of the Act, 21 U.S.C.section 360bbb-3(b)(1), unless the authorization is terminated  or revoked sooner.       Influenza A by PCR NEGATIVE NEGATIVE Final   Influenza B by PCR NEGATIVE NEGATIVE Final    Comment: (NOTE) The Xpert Xpress SARS-CoV-2/FLU/RSV plus assay is intended as an aid in the diagnosis of influenza from Nasopharyngeal swab specimens and should not be used as a sole basis for treatment. Nasal washings and aspirates are unacceptable for Xpert Xpress SARS-CoV-2/FLU/RSV testing.  Fact Sheet for Patients: EntrepreneurPulse.com.au  Fact Sheet for Healthcare Providers: IncredibleEmployment.be  This test is not yet approved  or cleared by the [place] and has been authorized for detection and/or diagnosis of SARS-CoV-2 by FDA under an Emergency Use Authorization (EUA). This EUA will remain in effect (meaning this test can be used) for the duration of the COVID-19 declaration under Section 564(b)(1) of the Act, 21 U.S.C. section 360bbb-3(b)(1), unless the authorization is terminated or revoked.  Performed at [facility], [address], [place], [place] [address]          Radiology Studies: No results found.      Scheduled Meds:  aspirin EC  325 mg Oral Daily    azithromycin  500 mg Oral Daily   docusate sodium  100 mg Oral BID   enoxaparin (LOVENOX) injection  40 mg Subcutaneous Q24H   feeding supplement  237 mL Oral BID BM   fluticasone furoate-vilanterol  1 puff Inhalation Daily   ipratropium-albuterol  3 mL Nebulization BID   methylPREDNISolone (SOLU-MEDROL) injection  80 mg Intravenous Q12H   metoprolol tartrate  25 mg Oral BID   sertraline  25 mg Oral Daily   sodium chloride flush  3 mL Intravenous Q12H   umeclidinium bromide  1 puff Inhalation Daily   Continuous Infusions:          Glade [name], MD [facility] 06/16/2022, 9:33 AM

## 2022-06-17 DIAGNOSIS — F32A Depression, unspecified: Secondary | ICD-10-CM | POA: Diagnosis not present

## 2022-06-17 DIAGNOSIS — J441 Chronic obstructive pulmonary disease with (acute) exacerbation: Secondary | ICD-10-CM | POA: Diagnosis not present

## 2022-06-17 DIAGNOSIS — J9601 Acute respiratory failure with hypoxia: Secondary | ICD-10-CM | POA: Diagnosis not present

## 2022-06-17 DIAGNOSIS — I10 Essential (primary) hypertension: Secondary | ICD-10-CM | POA: Diagnosis not present

## 2022-06-17 MED ORDER — PREDNISONE 20 MG PO TABS: 40.0000 mg | ORAL_TABLET | Freq: Every day | ORAL | 0 refills | Status: AC

## 2022-06-17 MED ORDER — IPRATROPIUM-ALBUTEROL 0.5-2.5 (3) MG/3ML IN SOLN: 3.0000 mL | RESPIRATORY_TRACT | 0 refills | Status: DC | PRN

## 2022-06-17 NOTE — Progress Notes (Addendum)
SATURATION QUALIFICATIONS: (This note is used to comply with regulatory documentation for home oxygen)  Patient Saturations on Room Air at Rest = 93%  Patient Saturations on Room Air while Ambulating = 88%  Patient Saturations on 2 Liters of oxygen while Ambulating = 93%  Please briefly explain why patient needs home oxygen: pt requires supplemental oxygen to maintain oxygen saturations at acceptable, safe levels with physical activity.  Lynnda Child, OTD, OTR/L Acute Rehab [phone]

## 2022-06-17 NOTE — Progress Notes (Signed)
Occupational Therapy Treatment Patient Details Name: Crystal Silva MRN: [id number] DOB: Sep 07, 1960 Today's Date: 06/17/2022   History of present illness 61 year old female presents to the ER because of shortness of breath; Admitted for COPD exacerbation;  with known history of COPD and aortic stenosis status post aortic replacement   OT comments  Pt progressing towards goals, on 2L O2 upon arrival, pt able to complete simulated toilet transfer and short distance ambulation with supervision. See other OT note on this date for O2 sats, however lowest SpO2 reading 88% while ambulating on RA, increased to 93% on 2L O2. Pt presenting with impairments listed below, will follow acutely. Recommend d/c to venue listed below.   Recommendations for follow up therapy are one component of a multi-disciplinary discharge planning process, led by the attending physician.  Recommendations may be updated based on patient status, additional functional criteria and insurance authorization.    Follow Up Recommendations  Other (comment) (pulmonary rehab)    Assistance Recommended at Discharge PRN  Patient can return home with the following      Equipment Recommendations  None recommended by OT    Recommendations for Other Services PT consult    Precautions / Restrictions Precautions Precautions: Fall Precaution Comments: Fall risk is present, b;ut minimal Restrictions Weight Bearing Restrictions: No       Mobility Bed Mobility Overal bed mobility: Modified Independent                  Transfers Overall transfer level: Needs assistance Equipment used: None Transfers: Sit to/from Stand Sit to Stand: Supervision                 Balance Overall balance assessment: Mild deficits observed, not formally tested                                         ADL either performed or assessed with clinical judgement   ADL Overall ADL's : Needs assistance/impaired                          Toilet Transfer: Supervision/safety;Ambulation           Functional mobility during ADLs: Supervision/safety      Extremity/Trunk Assessment Upper Extremity Assessment Upper Extremity Assessment: Generalized weakness   Lower Extremity Assessment Lower Extremity Assessment: Defer to PT evaluation        Vision   Vision Assessment?: No apparent visual deficits   Perception Perception Perception: Not tested   Praxis Praxis Praxis: Not tested    Cognition Arousal/Alertness: Awake/alert Behavior During Therapy: WFL for tasks assessed/performed Overall Cognitive Status: Within Functional Limits for tasks assessed                                          Exercises      Shoulder Instructions       General Comments see prior OT note for O2 sats    Pertinent Vitals/ Pain       Pain Assessment Pain Assessment: No/denies pain  Home Living  Prior Functioning/Environment              Frequency  Min 2X/week        Progress Toward Goals  OT Goals(current goals can now be found in the care plan section)  Progress towards OT goals: Progressing toward goals  Acute Rehab OT Goals Patient Stated Goal: none stated OT Goal Formulation: With patient Time For Goal Achievement: 06/28/22 Potential to Achieve Goals: Good ADL Goals Additional ADL Goal #1: Pt will demonstrate independence with 3 energy conservation strategies during ADL/IADL. Additional ADL Goal #2: Pt will complete OOB ADL/IADL for with appropriate level of rest breaks and SpO2 >92%  Plan Discharge plan remains appropriate;Frequency remains appropriate    Co-evaluation                 AM-PAC OT "6 Clicks" Daily Activity     Outcome Measure   Help from another person eating meals?: None Help from another person taking care of personal grooming?: A Little Help from another person  toileting, which includes using toliet, bedpan, or urinal?: A Little Help from another person bathing (including washing, rinsing, drying)?: A Little Help from another person to put on and taking off regular upper body clothing?: None Help from another person to put on and taking off regular lower body clothing?: A Little 6 Click Score: 20    End of Session Equipment Utilized During Treatment: Oxygen  OT Visit Diagnosis: Other abnormalities of gait and mobility (R26.89)   Activity Tolerance Patient tolerated treatment well   Patient Left with call bell/phone within reach;in bed   Nurse Communication Mobility status        Time: 6503-5465 OT Time Calculation (min): 18 min  Charges: OT General Charges $OT Visit: 1 Visit OT Treatments $Therapeutic Activity: 8-22 mins  Alfonzo Beers, OTD, OTR/L Acute Rehab [phone]   Mayer Masker 06/17/2022, 9:41 AM

## 2022-06-17 NOTE — Progress Notes (Addendum)
   Crystal Silva, [place] to be discharged Home per MD order. Discussed prescriptions and follow up appointments with the patient. Prescriptions given to patient; medication list explained in detail. Patient verbalized understanding.  Skin clean, dry and intact without evidence of skin break down, no evidence of skin tears noted. IV catheter discontinued intact. Site without signs and symptoms of complications. Dressing and pressure applied. Pt denies pain at the site currently. No complaints noted.  Patient free of lines, drains, and wounds.   An After Visit Summary (AVS) was printed and given to the patient. Patient escorted via wheelchair, and discharged home via private auto.  Crystal Levins, RN

## 2022-06-17 NOTE — Progress Notes (Signed)
Mobility Specialist Progress Note:   06/17/22 1125  Mobility  Activity Ambulated with assistance in hallway  Level of Assistance Standby assist, set-up cues, supervision of patient - no hands on  Assistive Device None  Distance Ambulated (ft) 200 ft  Activity Response Tolerated well  Mobility Referral Yes  $Mobility charge 1 Mobility   Pt agreeable to mobility session. Required 2LO2 with ambulation. Pt back in bed with all needs met.   Nelta Numbers Acute Rehab Secure Chat or Office Phone: [phone]

## 2022-06-17 NOTE — Discharge Summary (Signed)
Physician Discharge Summary  Crystal Silva [id number] DOB: 05-26-1961 DOA: 06/12/2022  PCP: Celene Squibb, MD  Admit date: 06/12/2022 Discharge date: 06/17/2022  Admitted From: Home Disposition: Home  Recommendations for Outpatient Follow-up:  Follow up with PCP in 1 week with repeat CBC/BMP Outpatient evaluation and follow-up by pulmonary Consult regarding tobacco/smoking cessation Follow up in ED if symptoms worsen or new appear   Home Health: No Equipment/Devices: Oxygen via nasal cannula at 2 L/min.  Oxygen saturation dropped to the 80s on ambulation  Discharge Condition: Stable CODE STATUS: DNR Diet recommendation: Heart healthy  Brief/Interim Summary: 61 year old female with history of tobacco abuse, probable COPD, aortic stenosis status post aortic replacement presented with worsening shortness of breath.  She required supplemental oxygen on presentation; chest x-ray was negative for infiltrates.  She was started on IV Solu-Medrol.  During the hospitalization, she has shown slow improvement although she is still requiring supplemental oxygen.  She is still on IV Solu-Medrol.  Respiratory status is currently stable.  She was to go home today.  She will be discharged home today on oral prednisone along with nasal cannula at 2 L/min.  Outpatient follow-up with PCP.  Will benefit from pulmonary evaluation and follow-up as well.    Discharge Diagnoses:   Acute respiratory failure with hypoxia Probable COPD exacerbation Ongoing tobacco abuse -Does not wear oxygen at home.  Wean off as able. -No formal diagnosis of COPD.  Will need outpatient PFTs. -Chest x-ray negative for infiltrates.  Treated with Zithromax. - influenza and COVID-19 negative. -Treated with IV Solu-Medrol along with nebs and inhaler.   -Counseled regarding tobacco cessation -Still requiring 2 L oxygen by nasal cannula with oxygen saturation dropped to the 80s on ambulation.  Will need the same oxygen on  discharge.   -She was to go home today.  She will be discharged home today on oral prednisone 40 mg daily for 7 days.  Continue home inhaled regimen.  Also will prescribe DuoNeb as needed.  Hypertension -Continue metoprolol   Depression -Continue sertraline  Discharge Instructions  Discharge Instructions     Ambulatory referral to Pulmonology   Complete by: As directed    Reason for referral: Asthma/COPD   Diet - low sodium heart healthy   Complete by: As directed    Increase activity slowly   Complete by: As directed       Allergies as of 06/17/2022   No Known Allergies      Medication List     TAKE these medications    acetaminophen 500 MG tablet Commonly known as: TYLENOL Take 500 mg by mouth every 6 (six) hours as needed for moderate pain.   albuterol 108 (90 Base) MCG/ACT inhaler Commonly known as: VENTOLIN HFA Inhale 2 puffs into the lungs every 6 (six) hours as needed for wheezing or shortness of breath.   aspirin EC 325 MG tablet Take 1 tablet (325 mg total) by mouth daily.   Breztri Aerosphere 160-9-4.8 MCG/ACT Aero Generic drug: Budeson-Glycopyrrol-Formoterol Inhale 2 puffs into the lungs 2 (two) times daily.   calcium carbonate 500 MG chewable tablet Commonly known as: TUMS - dosed in mg elemental calcium Chew 1 tablet by mouth daily as needed for indigestion or heartburn.   ibuprofen 200 MG tablet Commonly known as: ADVIL Take 400-600 mg by mouth every 6 (six) hours as needed for mild pain.   ipratropium-albuterol 0.5-2.5 (3) MG/3ML Soln Commonly known as: DUONEB Take 3 mLs by nebulization every 4 (four) hours as  needed (Shortness of breath/wheezing).   metoprolol tartrate 25 MG tablet Commonly known as: LOPRESSOR Take 1 tablet (25 mg total) by mouth 2 (two) times daily.   multivitamin with minerals Tabs tablet Take 1 tablet by mouth daily.   predniSONE 20 MG tablet Commonly known as: DELTASONE Take 2 tablets (40 mg total) by mouth daily  with breakfast for 7 days. What changed:  how much to take when to take this   sertraline 25 MG tablet Commonly known as: ZOLOFT Take 25 mg by mouth daily.               Durable Medical Equipment  (From admission, onward)           Start     Ordered   06/17/22 1000  DME Oxygen  Once       Question Answer Comment  Length of Need 6 Months   Mode or (Route) Nasal cannula   Liters per Minute 2   Frequency Continuous (stationary and portable oxygen unit needed)   Oxygen conserving device Yes   Oxygen delivery system Gas      06/17/22 1000            No Known Allergies  Consultations: None   Procedures/Studies: DG Chest 2 View  Result Date: 06/12/2022 CLINICAL DATA:  Dyspnea, COPD exacerbation EXAM: CHEST - 2 VIEW COMPARISON:  06/08/2022 FINDINGS: The lungs are hyperinflated in keeping with changes of underlying COPD. No confluent pulmonary infiltrate. Stable nodule within the right mid lung zone corresponding to a pulmonary nodule noted on CT examination of 05/30/2021. No pneumothorax or pleural effusion. Aortic valve replacement has been performed. Cardiac size within normal limits. Pulmonary vascularity is normal. Interval development of age-indeterminate compression deformities of T8, T9, and T10. Stable superior endplate fracture of 624THL. IMPRESSION: 1. No active cardiopulmonary disease. COPD. 2. Interval development of age-indeterminate compression deformities of T8, T9, and T10. Electronically Signed   By: Fidela Salisbury M.D.   On: 06/12/2022 20:28   ECHOCARDIOGRAM COMPLETE  Result Date: 06/09/2022    ECHOCARDIOGRAM REPORT   Patient Name:   Crystal Silva Date of Exam: 06/09/2022 Medical Rec #:  [id number]     Height:       69.0 in Accession #:    [id number]    Weight:       180.0 lb Date of Birth:  1961/06/20    BSA:          1.976 m Patient Age:    61 years      BP:           112/80 mmHg Patient Gender: F             HR:           57 bpm. Exam Location:   [facility] Procedure: 2D Echo, Cardiac Doppler and Color Doppler Indications:    R06.02 SOB  History:        Patient has prior history of Echocardiogram examinations, most                 recent 07/02/2021. COPD, S/p AVR (95mm Inspiris Resilia),                 Signs/Symptoms:Shortness of Breath; Risk Factors:Current Smoker.                 Aortic Valve: 23 mm Inspiris Resilia valve is present in the  aortic position.  Sonographer:    Coralyn Helling RDCS Referring Phys: [id number]  Sonographer Comments: Technically difficult study due to poor echo windows. IMPRESSIONS  1. Left ventricular ejection fraction, by estimation, is 60 to 65%. The left ventricle has normal function. The left ventricle has no regional wall motion abnormalities. Left ventricular diastolic parameters were normal.  2. Right ventricular systolic function is normal. The right ventricular size is normal. There is normal pulmonary artery systolic pressure. The estimated right ventricular systolic pressure is 0000000 mmHg.  3. The mitral valve is normal in structure. No evidence of mitral valve regurgitation. No evidence of mitral stenosis.  4. Tricuspid valve regurgitation is severe.  5. The aortic valve has been repaired/replaced. Aortic valve regurgitation is not visualized. No aortic stenosis is present. There is a 23 mm Inspiris Resilia valve present in the aortic position. Echo findings are consistent with normal structure and function of the aortic valve prosthesis. Aortic valve mean gradient measures 14.0 mmHg. Aortic valve Vmax measures 2.62 m/s.  6. The inferior vena cava is normal in size with greater than 50% respiratory variability, suggesting right atrial pressure of 3 mmHg. Comparison(s): No significant change from prior study. Prior images reviewed side by side. FINDINGS  Left Ventricle: Left ventricular ejection fraction, by estimation, is 60 to 65%. The left ventricle has normal function. The left  ventricle has no regional wall motion abnormalities. The left ventricular internal cavity size was normal in size. There is  no left ventricular hypertrophy. Left ventricular diastolic parameters were normal. Right Ventricle: The right ventricular size is normal. No increase in right ventricular wall thickness. Right ventricular systolic function is normal. There is normal pulmonary artery systolic pressure. The tricuspid regurgitant velocity is 2.73 m/s, and  with an assumed right atrial pressure of 3 mmHg, the estimated right ventricular systolic pressure is 0000000 mmHg. Left Atrium: Left atrial size was normal in size. Right Atrium: Right atrial size was normal in size. Pericardium: There is no evidence of pericardial effusion. Mitral Valve: The mitral valve is normal in structure. No evidence of mitral valve regurgitation. No evidence of mitral valve stenosis. Tricuspid Valve: The tricuspid valve is normal in structure. Tricuspid valve regurgitation is severe. No evidence of tricuspid stenosis. Aortic Valve: The aortic valve has been repaired/replaced. Aortic valve regurgitation is not visualized. No aortic stenosis is present. Aortic valve mean gradient measures 14.0 mmHg. Aortic valve peak gradient measures 27.6 mmHg. Aortic valve area, by VTI measures 0.95 cm. There is a 23 mm Inspiris Resilia valve present in the aortic position. Echo findings are consistent with normal structure and function of the aortic valve prosthesis. Pulmonic Valve: The pulmonic valve was normal in structure. Pulmonic valve regurgitation is not visualized. No evidence of pulmonic stenosis. Aorta: The aortic root is normal in size and structure. Venous: The inferior vena cava is normal in size with greater than 50% respiratory variability, suggesting right atrial pressure of 3 mmHg. IAS/Shunts: No atrial level shunt detected by color flow Doppler.  LEFT VENTRICLE PLAX 2D LVIDd:         4.50 cm   Diastology LVIDs:         3.00 cm   LV e'  medial:    11.60 cm/s LV PW:         0.80 cm   LV E/e' medial:  10.7 LV IVS:        0.80 cm   LV e' lateral:   14.90 cm/s LVOT diam:  1.80 cm   LV E/e' lateral: 8.3 LV SV:         58 LV SV Index:   29 LVOT Area:     2.54 cm  RIGHT VENTRICLE             IVC RV S prime:     15.90 cm/s  IVC diam: 1.50 cm TAPSE (M-mode): 1.4 cm RVSP:           32.8 mmHg LEFT ATRIUM             Index        RIGHT ATRIUM           Index LA diam:        3.10 cm 1.57 cm/m   RA Pressure: 3.00 mmHg LA Vol (A2C):   35.6 ml 18.02 ml/m  RA Area:     12.50 cm LA Vol (A4C):   24.2 ml 12.25 ml/m  RA Volume:   30.80 ml  15.59 ml/m LA Biplane Vol: 29.7 ml 15.03 ml/m  AORTIC VALVE AV Area (Vmax):    0.87 cm AV Area (Vmean):   0.94 cm AV Area (VTI):     0.95 cm AV Vmax:           262.50 cm/s AV Vmean:          172.500 cm/s AV VTI:            0.607 m AV Peak Grad:      27.6 mmHg AV Mean Grad:      14.0 mmHg LVOT Vmax:         89.40 cm/s LVOT Vmean:        63.700 cm/s LVOT VTI:          0.227 m LVOT/AV VTI ratio: 0.37  AORTA Ao Root diam: 2.80 cm Ao Asc diam:  3.10 cm MITRAL VALVE                TRICUSPID VALVE MV Area (PHT): 3.15 cm     TR Peak grad:   29.8 mmHg MV Decel Time: 241 msec     TR Vmax:        273.00 cm/s MV E velocity: 123.67 cm/s  Estimated RAP:  3.00 mmHg MV A velocity: 83.13 cm/s   RVSP:           32.8 mmHg MV E/A ratio:  1.49                             SHUNTS                             Systemic VTI:  0.23 m                             Systemic Diam: 1.80 cm Candee Furbish MD Electronically signed by Candee Furbish MD Signature Date/Time: 06/09/2022/10:38:45 AM    Final    DG Chest Port 1 View  Result Date: 06/08/2022 CLINICAL DATA:  61 year old female with history of shortness of breath. EXAM: PORTABLE CHEST 1 VIEW COMPARISON:  Chest x-ray 07/02/2021. FINDINGS: Lung volumes are increased with emphysematous changes. No consolidative airspace disease. No pleural effusions. No pneumothorax. Small pulmonary nodule in the  right mid lung estimated measure 7 mm. Pulmonary vasculature and the cardiomediastinal silhouette are within normal limits. Atherosclerosis in the thoracic aorta.  Status post median sternotomy for aortic valve replacement with a stented bioprosthesis. IMPRESSION: 1. No radiographic evidence of acute cardiopulmonary disease. 2. Aortic atherosclerosis. 3. Emphysema. 4. Small pulmonary nodule in the right mid lung corresponding to the right upper lobe pulmonary nodule noted on prior chest CT 05/30/2021. Electronically Signed   By: Vinnie Langton M.D.   On: 06/08/2022 06:27   DG Lumbar Spine Complete  Result Date: 06/05/2022 CLINICAL DATA:  Low back pain EXAM: LUMBAR SPINE - COMPLETE 4+ VIEW COMPARISON:  CTA 05/01/2021 FINDINGS: Interval development of a superior endplate fracture of L4 with approximately 25% loss of height, age indeterminate. No retropulsion. Normal lumbar lordosis. Remaining vertebral body height has been preserved. Intervertebral disc space at L5-S1 is not well profiled on this examination. Remaining intervertebral disc spaces are preserved. Advanced atherosclerotic calcifications seen within the aortoiliac vasculature. Paraspinal soft tissues are otherwise unremarkable. Oblique views demonstrate no evidence of pars defect. IMPRESSION: Age-indeterminate superior endplate fracture of L4 with approximately 25% loss of height. No retropulsion. Correlation for point tenderness would be helpful. MRI examination may be helpful to confirm a clinically suspected acute fracture. Electronically Signed   By: Fidela Salisbury M.D.   On: 06/05/2022 12:32      Subjective: Patient seen and examined at bedside.  Still complains of intermittent shortness of breath but feels okay to go home today.  No fever, chest pain or vomiting reported.  Discharge Exam: Vitals:   06/17/22 0410 06/17/22 0845  BP: 115/61   Pulse: 66 66  Resp: 19 18  Temp: 97.9 F (36.6 C)   SpO2: 94% 95%    General: Pt is  alert, awake, not in acute distress.  Chronically ill and deconditioned.  Currently on 2 L oxygen via nasal cannula. Cardiovascular: rate controlled, S1/S2 + Respiratory: bilateral decreased breath sounds at bases with scattered crackles Abdominal: Soft, NT, ND, bowel sounds + Extremities: Trace lower extremity edema present; no cyanosis    The results of significant diagnostics from this hospitalization (including imaging, microbiology, ancillary and laboratory) are listed below for reference.     Microbiology: Recent Results (from the past 240 hour(s))  Resp Panel by RT-PCR (Flu A&B, Covid) Anterior Nasal Swab     Status: None   Collection Time: 06/14/22 10:57 AM   Specimen: Anterior Nasal Swab  Result Value Ref Range Status   SARS Coronavirus 2 by RT PCR NEGATIVE NEGATIVE Final    Comment: (NOTE) SARS-CoV-2 target nucleic acids are NOT DETECTED.  The SARS-CoV-2 RNA is generally detectable in upper respiratory specimens during the acute phase of infection. The lowest concentration of SARS-CoV-2 viral copies this assay can detect is 138 copies/mL. A negative result does not preclude SARS-Cov-2 infection and should not be used as the sole basis for treatment or other patient management decisions. A negative result may occur with  improper specimen collection/handling, submission of specimen other than nasopharyngeal swab, presence of viral mutation(s) within the areas targeted by this assay, and inadequate number of viral copies(<138 copies/mL). A negative result must be combined with clinical observations, patient history, and epidemiological information. The expected result is Negative.  Fact Sheet for Patients:  EntrepreneurPulse.com.au  Fact Sheet for Healthcare Providers:  IncredibleEmployment.be  This test is no t yet approved or cleared by the [place] FDA and  has been authorized for detection and/or diagnosis of SARS-CoV-2  by FDA under an Emergency Use Authorization (EUA). This EUA will remain  in effect (meaning this test can be used) for the  duration of the COVID-19 declaration under Section 564(b)(1) of the Act, 21 U.S.C.section 360bbb-3(b)(1), unless the authorization is terminated  or revoked sooner.       Influenza A by PCR NEGATIVE NEGATIVE Final   Influenza B by PCR NEGATIVE NEGATIVE Final    Comment: (NOTE) The Xpert Xpress SARS-CoV-2/FLU/RSV plus assay is intended as an aid in the diagnosis of influenza from Nasopharyngeal swab specimens and should not be used as a sole basis for treatment. Nasal washings and aspirates are unacceptable for Xpert Xpress SARS-CoV-2/FLU/RSV testing.  Fact Sheet for Patients: EntrepreneurPulse.com.au  Fact Sheet for Healthcare Providers: IncredibleEmployment.be  This test is not yet approved or cleared by the [place] FDA and has been authorized for detection and/or diagnosis of SARS-CoV-2 by FDA under an Emergency Use Authorization (EUA). This EUA will remain in effect (meaning this test can be used) for the duration of the COVID-19 declaration under Section 564(b)(1) of the Act, 21 U.S.C. section 360bbb-3(b)(1), unless the authorization is terminated or revoked.  Performed at [facility], [place] [address], [place], [place] [address]      Labs: BNP (last 3 results) Recent Labs    06/12/22 1949  BNP 123456   Basic Metabolic Panel: Recent Labs  Lab 06/12/22 1949 06/14/22 0302  NA 141 141  K 3.3* 5.1  CL 103 106  CO2 25 28  GLUCOSE 90 130*  BUN 14 17  CREATININE 0.66 0.64  CALCIUM 9.0 9.2   Liver Function Tests: No results for input(s): "AST", "ALT", "ALKPHOS", "BILITOT", "PROT", "ALBUMIN" in the last 168 hours. No results for input(s): "LIPASE", "AMYLASE" in the last 168 hours. No results for input(s): "AMMONIA" in the last 168 hours. CBC: Recent Labs  Lab 06/12/22 1949 06/14/22 0302  WBC  7.7 8.1  NEUTROABS 5.7  --   HGB 15.4* 15.0  HCT 45.6 45.1  MCV 99.1 100.2*  PLT 164 168   Cardiac Enzymes: No results for input(s): "CKTOTAL", "CKMB", "CKMBINDEX", "TROPONINI" in the last 168 hours. BNP: Invalid input(s): "POCBNP" CBG: No results for input(s): "GLUCAP" in the last 168 hours. D-Dimer No results for input(s): "DDIMER" in the last 72 hours. Hgb A1c No results for input(s): "HGBA1C" in the last 72 hours. Lipid Profile No results for input(s): "CHOL", "HDL", "LDLCALC", "TRIG", "CHOLHDL", "LDLDIRECT" in the last 72 hours. Thyroid function studies No results for input(s): "TSH", "T4TOTAL", "T3FREE", "THYROIDAB" in the last 72 hours.  Invalid input(s): "FREET3" Anemia work up No results for input(s): "VITAMINB12", "FOLATE", "FERRITIN", "TIBC", "IRON", "RETICCTPCT" in the last 72 hours. Urinalysis    Component Value Date/Time   COLORURINE YELLOW 05/20/2021 1436   APPEARANCEUR HAZY (A) 05/20/2021 1436   LABSPEC 1.019 05/20/2021 1436   PHURINE 5.0 05/20/2021 1436   GLUCOSEU NEGATIVE 05/20/2021 1436   HGBUR SMALL (A) 05/20/2021 1436   BILIRUBINUR NEGATIVE 05/20/2021 1436   KETONESUR NEGATIVE 05/20/2021 1436   PROTEINUR NEGATIVE 05/20/2021 1436   UROBILINOGEN 0.2 05/02/2009 1500   NITRITE NEGATIVE 05/20/2021 1436   LEUKOCYTESUR LARGE (A) 05/20/2021 1436   Sepsis Labs Recent Labs  Lab 06/12/22 1949 06/14/22 0302  WBC 7.7 8.1   Microbiology Recent Results (from the past 240 hour(s))  Resp Panel by RT-PCR (Flu A&B, Covid) Anterior Nasal Swab     Status: None   Collection Time: 06/14/22 10:57 AM   Specimen: Anterior Nasal Swab  Result Value Ref Range Status   SARS Coronavirus 2 by RT PCR NEGATIVE NEGATIVE Final    Comment: (NOTE) SARS-CoV-2  target nucleic acids are NOT DETECTED.  The SARS-CoV-2 RNA is generally detectable in upper respiratory specimens during the acute phase of infection. The lowest concentration of SARS-CoV-2 viral copies this assay can  detect is 138 copies/mL. A negative result does not preclude SARS-Cov-2 infection and should not be used as the sole basis for treatment or other patient management decisions. A negative result may occur with  improper specimen collection/handling, submission of specimen other than nasopharyngeal swab, presence of viral mutation(s) within the areas targeted by this assay, and inadequate number of viral copies(<138 copies/mL). A negative result must be combined with clinical observations, patient history, and epidemiological information. The expected result is Negative.  Fact Sheet for Patients:  EntrepreneurPulse.com.au  Fact Sheet for Healthcare Providers:  IncredibleEmployment.be  This test is no t yet approved or cleared by the [place] FDA and  has been authorized for detection and/or diagnosis of SARS-CoV-2 by FDA under an Emergency Use Authorization (EUA). This EUA will remain  in effect (meaning this test can be used) for the duration of the COVID-19 declaration under Section 564(b)(1) of the Act, 21 U.S.C.section 360bbb-3(b)(1), unless the authorization is terminated  or revoked sooner.       Influenza A by PCR NEGATIVE NEGATIVE Final   Influenza B by PCR NEGATIVE NEGATIVE Final    Comment: (NOTE) The Xpert Xpress SARS-CoV-2/FLU/RSV plus assay is intended as an aid in the diagnosis of influenza from Nasopharyngeal swab specimens and should not be used as a sole basis for treatment. Nasal washings and aspirates are unacceptable for Xpert Xpress SARS-CoV-2/FLU/RSV testing.  Fact Sheet for Patients: EntrepreneurPulse.com.au  Fact Sheet for Healthcare Providers: IncredibleEmployment.be  This test is not yet approved or cleared by the [place] FDA and has been authorized for detection and/or diagnosis of SARS-CoV-2 by FDA under an Emergency Use Authorization (EUA). This EUA will remain in  effect (meaning this test can be used) for the duration of the COVID-19 declaration under Section 564(b)(1) of the Act, 21 U.S.C. section 360bbb-3(b)(1), unless the authorization is terminated or revoked.  Performed at [facility], [place] [address], [place], [place] [address]      Time coordinating discharge: 35 minutes  SIGNED:   Aline August, MD  [facility] 06/17/2022, 10:00 AM

## 2022-06-17 NOTE — TOC Transition Note (Addendum)
Transition of Care [facility]) - CM/SW Discharge Note   Patient Details  Name: Crystal Silva MRN: [id number] Date of Birth: [birth year]  Transition of [facility] CM/SW Contact:  Tom-Johnson, Renea Ee, RN Phone Number: 06/17/2022, 11:00 AM   Clinical Narrative:     Patient is scheduled for discharge today. Home Oxygen and Nebulizer machine ordered from [employer] per patient's request, Erasmo Downer to deliver at bedside. No PT/OT f/u noted.  Pulmonary rehab recommended by OT, MD ordered Ambulatory referral to Pulmonary at [facility], info on AVS.  Family to transport at discharge. No further TOC needs noted.   12:05- CM received Pulmonary Rehab Referral Form from [facility] and notified MD of need to complete form. CM met with MD who states he is unable to complete form at this time as patient needs to consult with a Pulmonologist to get a diagnosis for patient to do rehab. MD already placed Pulmonology referral on AVS and patient aware.  No Further TOC needs noted.    Final next level of care: Home/Self Care Barriers to Discharge: Barriers Resolved   Patient Goals and CMS Choice Patient states their goals for this hospitalization and ongoing recovery are:: To return home CMS Medicare.gov Compare Post Acute Care list provided to:: Patient Choice offered to / list presented to : Patient  Discharge Placement                Patient to be transferred to facility by: Family      Discharge Plan and Services                DME Arranged: Oxygen DME Agency: [employer] Date DME Agency Contacted: 06/17/22 Time DME Agency Contacted: [phone] Representative spoke with at DME Agency: [place] Arranged: NA [place] Agency: NA        Social Determinants of Health ([place]) Interventions     Readmission Risk Interventions     No data to display

## 2022-06-19 DIAGNOSIS — J9601 Acute respiratory failure with hypoxia: Secondary | ICD-10-CM | POA: Diagnosis not present

## 2022-06-19 DIAGNOSIS — I10 Essential (primary) hypertension: Secondary | ICD-10-CM | POA: Diagnosis not present

## 2022-06-23 DIAGNOSIS — E876 Hypokalemia: Secondary | ICD-10-CM | POA: Diagnosis not present

## 2022-06-23 DIAGNOSIS — J441 Chronic obstructive pulmonary disease with (acute) exacerbation: Secondary | ICD-10-CM | POA: Diagnosis not present

## 2022-06-23 DIAGNOSIS — Z9981 Dependence on supplemental oxygen: Secondary | ICD-10-CM | POA: Diagnosis not present

## 2022-06-23 DIAGNOSIS — F17200 Nicotine dependence, unspecified, uncomplicated: Secondary | ICD-10-CM | POA: Diagnosis not present

## 2022-06-24 ENCOUNTER — Encounter: Payer: Self-pay | Admitting: Thoracic Surgery (Cardiothoracic Vascular Surgery)

## 2022-06-24 ENCOUNTER — Ambulatory Visit
Admission: RE | Admit: 2022-06-24 | Discharge: 2022-06-24 | Disposition: A | Discharge status: Home / Self Care | Payer: BC Managed Care – PPO | Source: Ambulatory Visit | Attending: Thoracic Surgery (Cardiothoracic Vascular Surgery) | Admitting: Thoracic Surgery (Cardiothoracic Vascular Surgery)

## 2022-06-24 ENCOUNTER — Ambulatory Visit: Payer: Self-pay | Admitting: Thoracic Surgery (Cardiothoracic Vascular Surgery)

## 2022-06-24 VITALS — BP 144/80 | HR 69 | Resp 20 | Ht 69.0 in | Wt 179.0 lb

## 2022-06-24 DIAGNOSIS — J439 Emphysema, unspecified: Secondary | ICD-10-CM | POA: Diagnosis not present

## 2022-06-24 DIAGNOSIS — R911 Solitary pulmonary nodule: Secondary | ICD-10-CM

## 2022-06-24 NOTE — Progress Notes (Signed)
[address]       [place] [address]             [id number]     HPI: Ms. Couts returns for a scheduled follow-up visit regarding a right upper lobe lung nodule.  Tawana Pasch is a 61 year old woman with a history of tobacco abuse, COPD, bicuspid aortic valve, severe aortic stenosis, status post aortic valve replacement in 2022, and a right upper lobe pulmonary nodule.  She had a aortic valve replacement with a pericardial valve in October 2022.  She had some transient atrial fibrillation postoperatively.  She was readmitted about 10 days postoperatively with heart failure.  During that admission she had a CT of the chest which showed an 8 mm lung nodule in the right upper lobe.  I recommended a 82-month follow-up.  Unfortunately she changed jobs and her insurance coverage lapsed during that time and she now returns a year later with a follow-up scan.  About a week ago she was [facility] with a COPD flare.  Improved with steroids and antibiotics.  Currently still has some wheezing but breathing has improved.  Also has noted a little bit of swelling in her ankles.  Past Medical History:  Diagnosis Date   COPD (chronic obstructive pulmonary disease) (HCC)    Headache    Heart murmur     Current Outpatient Medications  Medication Sig Dispense Refill   acetaminophen (TYLENOL) 500 MG tablet Take 500 mg by mouth every 6 (six) hours as needed for moderate pain.     albuterol (VENTOLIN HFA) 108 (90 Base) MCG/ACT inhaler Inhale 2 puffs into the lungs every 6 (six) hours as needed for wheezing or shortness of breath. 8 g 2   aspirin EC 325 MG EC tablet Take 1 tablet (325 mg total) by mouth daily.     BREZTRI AEROSPHERE 160-9-4.8 MCG/ACT AERO Inhale 2 puffs into the lungs 2 (two) times daily.     calcium carbonate (TUMS - DOSED IN MG ELEMENTAL CALCIUM) 500 MG chewable tablet Chew 1 tablet by mouth daily as needed for indigestion or heartburn.      ibuprofen (ADVIL) 200 MG tablet Take 400-600 mg by mouth every 6 (six) hours as needed for mild pain.     ipratropium-albuterol (DUONEB) 0.5-2.5 (3) MG/3ML SOLN Take 3 mLs by nebulization every 4 (four) hours as needed (Shortness of breath/wheezing). 360 mL 0   Multiple Vitamin (MULTIVITAMIN WITH MINERALS) TABS tablet Take 1 tablet by mouth daily.     predniSONE (DELTASONE) 20 MG tablet Take 2 tablets (40 mg total) by mouth daily with breakfast for 7 days. 14 tablet 0   sertraline (ZOLOFT) 25 MG tablet Take 50 mg by mouth daily.     metoprolol tartrate (LOPRESSOR) 25 MG tablet Take 1 tablet (25 mg total) by mouth 2 (two) times daily. 180 tablet 3   No current facility-administered medications for this visit.    Physical Exam BP (!) 144/80 (BP Location: Right Arm, Patient Position: Sitting, Cuff Size: Normal)   Pulse 69   Resp 20   Ht 5\' 9"  (1.753 m)   Wt 179 lb (81.2 kg)   LMP 12/02/2013   SpO2 96% Comment: RA  BMI 26.28 kg/m  61 year old woman in no acute distress Alert and oriented x3 with no focal deficits Lungs wheezing bilaterally Cardiac regular rate and rhythm with a 2/6 systolic murmur Trace edema both ankles  Diagnostic Tests: Echocardiogram  06/09/2022 IMPRESSIONS    1. Left ventricular ejection fraction, by estimation, is 60 to 65%. The  left ventricle has normal function. The left ventricle has no regional  wall motion abnormalities. Left ventricular diastolic parameters were  normal.   2. Right ventricular systolic function is normal. The right ventricular  size is normal. There is normal pulmonary artery systolic pressure. The  estimated right ventricular systolic pressure is 32.8 mmHg.   3. The mitral valve is normal in structure. No evidence of mitral valve  regurgitation. No evidence of mitral stenosis.   4. Tricuspid valve regurgitation is severe.   5. The aortic valve has been repaired/replaced. Aortic valve  regurgitation is not visualized. No aortic  stenosis is present. There is a  23 mm Inspiris Resilia valve present in the aortic position. Echo findings  are consistent with normal structure and  function of the aortic valve prosthesis. Aortic valve mean gradient  measures 14.0 mmHg. Aortic valve Vmax measures 2.62 m/s.   6. The inferior vena cava is normal in size with greater than 50%  respiratory variability, suggesting right atrial pressure of 3 mmHg.   Comparison(s): No significant change from prior study. Prior images  reviewed side by side.   CT CHEST WITHOUT CONTRAST   TECHNIQUE: Multidetector CT imaging of the chest was performed following the standard protocol without IV contrast.   RADIATION DOSE REDUCTION: This exam was performed according to the departmental dose-optimization program which includes automated exposure control, adjustment of the mA and/or kV according to patient size and/or use of iterative reconstruction technique.   COMPARISON:  Previous studies including CT done on 05/30/2021 and chest radiographs done on 06/12/2022   FINDINGS: Cardiovascular: There is prosthetic aortic valve. There are scattered arterial calcifications in thoracic aorta.   Mediastinum/Nodes: Subcentimeter nodes are seen in mediastinum.   Lungs/Pleura: Centrilobular emphysema is seen. In image 63 of series 5, there is 8 mm nodule in right upper lobe which appears stable. This nodule has been seen in previous examinations dating as far back as 04/29/2021. There are no new nodules. No new focal pulmonary consolidation is seen. There is no pleural effusion or pneumothorax.   Upper Abdomen: There is previous intervention in the right lobe of liver. No acute findings are seen.   Musculoskeletal: There is decrease in height of bodies of T6, T8, T9 and T10 vertebrae. Schmorl's node in the upper endplate of body of T12 vertebra has not changed.   IMPRESSION: There is 8 mm nodule in right upper lobe with no interval  change. There are no new nodules or new infiltrates. Centrilobular emphysema.   There is decrease in height of bodies of T6, T8, T9 and T10 vertebrae suggesting compression fractures due to trauma or underlying pathology. If there are focal symptoms, follow-up MRI may be considered.   Other findings as described in the body of the report.     Electronically Signed   By: Ernie Avena M.D.   On: 06/24/2022 11:05 I personally reviewed the CT images.  Postoperative changes from aortic valve replacement.  8 mm right upper lobe nodule stable to slightly decreased from scan a year ago.  Impression: Crystal Silva is a 61 year old woman with a history of tobacco abuse, COPD, bicuspid aortic valve, severe aortic stenosis, status post aortic valve replacement in 2022, and a right upper lobe pulmonary nodule.  Status post aortic valve replacement-had a severely stenotic bicuspid aortic valve.  Replaced with a tissue valve a year ago.  Recent  echo showed preserved left-ventricular function with good function of the prosthetic valve.  Severe tricuspid regurgitation-new from 2022.  Was mild to at most moderate at that time.  Minimal swelling in her ankles but otherwise no overt signs of right heart failure.  Medical management.  Tobacco abuse-unfortunately continues to smoke.  She is aware of the health issues involved.  Counseled smoking cessation.  Right upper lobe lung nodule-missed follow-up 6 months ago but still stable at 1 year.  Still recommend semiannual follow-up about 2 years.  We will plan to see her back in 6 months with a CT chest.  Plan: Follow-up with Dr. Margo Aye and Dr. Flora Lipps I will plan to see her back in 6 months with a CT chest  [name], MD [facility] [phone]

## 2022-06-27 ENCOUNTER — Encounter: Payer: Self-pay | Admitting: Pulmonary Disease

## 2022-06-27 ENCOUNTER — Institutional Professional Consult (permissible substitution): Payer: BC Managed Care – PPO | Admitting: Pulmonary Disease

## 2022-06-27 ENCOUNTER — Ambulatory Visit: Payer: BC Managed Care – PPO | Admitting: Pulmonary Disease

## 2022-06-27 ENCOUNTER — Ambulatory Visit (INDEPENDENT_AMBULATORY_CARE_PROVIDER_SITE_OTHER): Payer: BC Managed Care – PPO | Admitting: Pulmonary Disease

## 2022-06-27 VITALS — BP 122/64 | HR 69 | Temp 98.1°F | Ht 69.0 in | Wt 177.8 lb

## 2022-06-27 DIAGNOSIS — R0602 Shortness of breath: Secondary | ICD-10-CM | POA: Diagnosis not present

## 2022-06-27 DIAGNOSIS — Z720 Tobacco use: Secondary | ICD-10-CM

## 2022-06-27 DIAGNOSIS — J449 Chronic obstructive pulmonary disease, unspecified: Secondary | ICD-10-CM

## 2022-06-27 DIAGNOSIS — F17210 Nicotine dependence, cigarettes, uncomplicated: Secondary | ICD-10-CM

## 2022-06-27 DIAGNOSIS — Z952 Presence of prosthetic heart valve: Secondary | ICD-10-CM

## 2022-06-27 LAB — PULMONARY FUNCTION TEST
DL/VA % pred: 65 %
DL/VA: 2.66 ml/min/mmHg/L
DLCO cor % pred: 55 %
DLCO cor: 13.2 ml/min/mmHg
DLCO unc % pred: 57 %
DLCO unc: 13.81 ml/min/mmHg
FEF 25-75 Post: 0.36 L/sec
FEF 25-75 Pre: 0.38 L/sec
FEF2575-%Change-Post: -5 %
FEF2575-%Pred-Post: 13 %
FEF2575-%Pred-Pre: 14 %
FEV1-%Change-Post: -6 %
FEV1-%Pred-Post: 29 %
FEV1-%Pred-Pre: 31 %
FEV1-Post: 0.89 L
FEV1-Pre: 0.95 L
FEV1FVC-%Change-Post: -10 %
FEV1FVC-%Pred-Pre: 62 %
FEV6-%Change-Post: 0 %
FEV6-%Pred-Post: 48 %
FEV6-%Pred-Pre: 48 %
FEV6-Post: 1.86 L
FEV6-Pre: 1.86 L
FEV6FVC-%Change-Post: -4 %
FEV6FVC-%Pred-Post: 94 %
FEV6FVC-%Pred-Pre: 98 %
FVC-%Change-Post: 5 %
FVC-%Pred-Post: 51 %
FVC-%Pred-Pre: 49 %
FVC-Post: 2.04 L
FVC-Pre: 1.94 L
Post FEV1/FVC ratio: 44 %
Post FEV6/FVC ratio: 91 %
Pre FEV1/FVC ratio: 49 %
Pre FEV6/FVC Ratio: 96 %

## 2022-06-27 NOTE — Patient Instructions (Signed)
Attempted Full PFT Today. Performed Pre/Post Spirometry and DLCO.  

## 2022-06-27 NOTE — Progress Notes (Signed)
Crystal Silva    [id number]    12-05-1960  Primary Care Physician:Silva, Crystal Hazel, MD  Referring Physician: Dyann Kief, PA-C [address] [place],  [place] [address]  Chief complaint: Consult for COPD  HPI: 60 y.o. who  history of tobacco abuse, COPD, bicuspid aortic valve, severe aortic stenosis, status post aortic valve replacement in 2022, and a right upper lobe pulmonary nodule.    She is an active smoker with history of COPD.  She has been on breztri since July 2023. Hospitalized in early nov 2023 for COPD exacerbation, covid and flu negative.  She was treated with azithromycin, prednisone, bronchodilators and discharged on 2 L oxygen.  She has stopped using the oxygen as she is feeling better.  States that breathing is back to normal Continues to smoke  She follows with Dr. Dorris Silva for right upper lobe pulmonary nodule which is stable  Pets: 2 dogs Occupation: Geologist, engineering Exposures: No mold, hot tub, Jacuzzi.  No feather pillows or comforters Smoking history:  44-pack-year smoker.  Continues to smoke half pack per day Travel history: Grew up in [place].  No significant recent travel Relevant family history: Father died of lung cancer.  He was a smoker.   Outpatient Encounter Medications as of 06/27/2022  Medication Sig   acetaminophen (TYLENOL) 500 MG tablet Take 500 mg by mouth every 6 (six) hours as needed for moderate pain.   albuterol (VENTOLIN HFA) 108 (90 Base) MCG/ACT inhaler Inhale 2 puffs into the lungs every 6 (six) hours as needed for wheezing or shortness of breath.   aspirin EC 325 MG EC tablet Take 1 tablet (325 mg total) by mouth daily.   BREZTRI AEROSPHERE 160-9-4.8 MCG/ACT AERO Inhale 2 puffs into the lungs 2 (two) times daily.   calcium carbonate (TUMS - DOSED IN MG ELEMENTAL CALCIUM) 500 MG chewable tablet Chew 1 tablet by mouth daily as needed for indigestion or heartburn.   ibuprofen (ADVIL) 200 MG tablet Take 400-600 mg by  mouth every 6 (six) hours as needed for mild pain.   ipratropium-albuterol (DUONEB) 0.5-2.5 (3) MG/3ML SOLN Take 3 mLs by nebulization every 4 (four) hours as needed (Shortness of breath/wheezing).   Multiple Vitamin (MULTIVITAMIN WITH MINERALS) TABS tablet Take 1 tablet by mouth daily.   sertraline (ZOLOFT) 25 MG tablet Take 50 mg by mouth daily.   metoprolol tartrate (LOPRESSOR) 25 MG tablet Take 1 tablet (25 mg total) by mouth 2 (two) times daily.   No facility-administered encounter medications on file as of 06/27/2022.    Allergies as of 06/27/2022   (No Known Allergies)    Past Medical History:  Diagnosis Date   COPD (chronic obstructive pulmonary disease) (HCC)    Headache    Heart murmur     Past Surgical History:  Procedure Laterality Date   AORTIC VALVE REPLACEMENT N/A 05/22/2021   Procedure: AORTIC VALVE REPLACEMENT (AVR) WITH STERNAL PLATING. 23 MM INSPIRIS RESILIA  AORTIC VALVE.;  Surgeon: Crystal Slot, MD;  Location: [facility] OR;  Service: Open Heart Surgery;  Laterality: N/A;   CHOLECYSTECTOMY     lt knee surgery     RIGHT/LEFT HEART CATH AND CORONARY ANGIOGRAPHY N/A 04/30/2021   Procedure: RIGHT/LEFT HEART CATH AND CORONARY ANGIOGRAPHY;  Surgeon: Crystal Lex, MD;  Location: [facility] INVASIVE CV LAB;  Service: Cardiovascular;  Laterality: N/A;   TEE WITHOUT CARDIOVERSION N/A 05/22/2021   Procedure: TRANSESOPHAGEAL ECHOCARDIOGRAM (TEE);  Surgeon: Crystal Silva,  Crystal Decent, MD;  Location: [facility] OR;  Service: Open Heart Surgery;  Laterality: N/A;    Family History  Problem Relation Age of Onset   Heart failure Mother    Heart disease Mother        stent    Social History   Socioeconomic History   Marital status: Divorced    Spouse name: Not on file   Number of children: Not on file   Years of education: Not on file   Highest education level: Not on file  Occupational History   Occupation: office work  Tobacco Use   Smoking status: Every Day    Packs/day: 1.00     Years: 44.00    Total pack years: 44.00    Types: Cigarettes   Smokeless tobacco: Never   Tobacco comments:    Less then 1 pk 06/27/22  Substance and Sexual Activity   Alcohol use: Yes    Comment: rare   Drug use: No   Sexual activity: Not on file  Other Topics Concern   Not on file  Social History Narrative   Lives with daughter and grandson   Social Determinants of Health   Financial Resource Strain: Not on file  Food Insecurity: No Food Insecurity (06/13/2022)   Hunger Vital Sign    Worried About Running Out of Food in the Last Year: Never true    Ran Out of Food in the Last Year: Never true  Transportation Needs: No Transportation Needs (06/13/2022)   PRAPARE - Administrator, Civil Service (Medical): No    Lack of Transportation (Non-Medical): No  Physical Activity: Not on file  Stress: Not on file  Social Connections: Not on file  Intimate Partner Violence: Not At Risk (06/13/2022)   Humiliation, Afraid, Rape, and Kick questionnaire    Fear of Current or Ex-Partner: No    Emotionally Abused: No    Physically Abused: No    Sexually Abused: No    Review of systems: Review of Systems  Constitutional: Negative for fever and chills.  HENT: Negative.   Eyes: Negative for blurred vision.  Respiratory: as per HPI  Cardiovascular: Negative for chest pain and palpitations.  Gastrointestinal: Negative for vomiting, diarrhea, blood per rectum. Genitourinary: Negative for dysuria, urgency, frequency and hematuria.  Musculoskeletal: Negative for myalgias, back pain and joint pain.  Skin: Negative for itching and rash.  Neurological: Negative for dizziness, tremors, focal weakness, seizures and loss of consciousness.  Endo/Heme/Allergies: Negative for environmental allergies.  Psychiatric/Behavioral: Negative for depression, suicidal ideas and hallucinations.  All other systems reviewed and are negative.  Physical Exam: Blood pressure 122/64, pulse 69,  temperature 98.1 F (36.7 C), temperature source Oral, height 5\' 9"  (1.753 m), weight 177 lb 12.8 oz (80.6 kg), last menstrual period 12/02/2013, SpO2 94 %. Gen:      No acute distress HEENT:  EOMI, sclera anicteric Neck:     No masses; no thyromegaly Lungs:    Clear to auscultation bilaterally; normal respiratory effort CV:         Regular rate and rhythm; no murmurs Abd:      + bowel sounds; soft, non-tender; no palpable masses, no distension Ext:    No edema; adequate peripheral perfusion Skin:      Warm and dry; no rash Neuro: alert and oriented x 3 Psych: normal mood and affect  Data Reviewed: Imaging: CT chest 02/21/2022-emphysema, 8 mm stable nodule in the right upper lobe. I have reviewed the images personally.  PFTs: 04/27/2022 FVC 2.04 [51%], FEV1 0.89 [29%], F/F 44, DLCO 13.81 [57%] Severe obstruction, moderate-severe diffusion defect  Labs:  Assessment:  Severe COPD PFTs reviewed with severe obstructive changes and diffusion impairment She is recovering from recent hospitalization for COPD exacerbation.  She did not desat on exertion and has stopped her supplemental oxygen Continue breztri inhaler We will refer to pulmonary rehab at return visit  Active smoker Discussed smoking cessation in detail with patient.  She wants to quit on her own and declines Chantix and nicotine patches Reassess at return visit.  Time spent counseling-5 minutes  Lung nodule Follows with Dr. Dorris Silva with CT ordered for 6 months  Plan/Recommendations: Continue breztri Smoking cessation  Chilton Greathouse MD [facility] 06/27/2022, 1:40 PM  CC: Crystal Kief, PA-C

## 2022-06-27 NOTE — Progress Notes (Signed)
Attempted Full PFT Today. Performed Pre/Post Spirometry and DLCO.  

## 2022-06-27 NOTE — Patient Instructions (Signed)
I am glad improved after your recent hospitalization Continue the breztri inhaler We will check your oxygen levels on exertion today Work on smoking cessation Follow-up in 6 months.

## 2022-07-07 ENCOUNTER — Encounter: Payer: Self-pay | Admitting: *Deleted

## 2022-07-07 DIAGNOSIS — F419 Anxiety disorder, unspecified: Secondary | ICD-10-CM | POA: Diagnosis not present

## 2022-07-17 DIAGNOSIS — I10 Essential (primary) hypertension: Secondary | ICD-10-CM | POA: Diagnosis not present

## 2022-07-17 DIAGNOSIS — J9601 Acute respiratory failure with hypoxia: Secondary | ICD-10-CM | POA: Diagnosis not present

## 2022-08-17 DIAGNOSIS — J9601 Acute respiratory failure with hypoxia: Secondary | ICD-10-CM | POA: Diagnosis not present

## 2022-08-17 DIAGNOSIS — I10 Essential (primary) hypertension: Secondary | ICD-10-CM | POA: Diagnosis not present

## 2022-08-27 DIAGNOSIS — E785 Hyperlipidemia, unspecified: Secondary | ICD-10-CM | POA: Diagnosis not present

## 2022-08-27 DIAGNOSIS — R7303 Prediabetes: Secondary | ICD-10-CM | POA: Diagnosis not present

## 2022-09-02 DIAGNOSIS — I350 Nonrheumatic aortic (valve) stenosis: Secondary | ICD-10-CM | POA: Diagnosis not present

## 2022-09-02 DIAGNOSIS — Z1331 Encounter for screening for depression: Secondary | ICD-10-CM | POA: Diagnosis not present

## 2022-09-02 DIAGNOSIS — G43909 Migraine, unspecified, not intractable, without status migrainosus: Secondary | ICD-10-CM | POA: Diagnosis not present

## 2022-09-02 DIAGNOSIS — Z0000 Encounter for general adult medical examination without abnormal findings: Secondary | ICD-10-CM | POA: Diagnosis not present

## 2022-09-02 DIAGNOSIS — Z48812 Encounter for surgical aftercare following surgery on the circulatory system: Secondary | ICD-10-CM | POA: Diagnosis not present

## 2022-09-17 DIAGNOSIS — J9601 Acute respiratory failure with hypoxia: Secondary | ICD-10-CM | POA: Diagnosis not present

## 2022-09-17 DIAGNOSIS — I10 Essential (primary) hypertension: Secondary | ICD-10-CM | POA: Diagnosis not present

## 2022-10-16 DIAGNOSIS — I10 Essential (primary) hypertension: Secondary | ICD-10-CM | POA: Diagnosis not present

## 2022-10-16 DIAGNOSIS — J9601 Acute respiratory failure with hypoxia: Secondary | ICD-10-CM | POA: Diagnosis not present

## 2022-11-04 DIAGNOSIS — F17210 Nicotine dependence, cigarettes, uncomplicated: Secondary | ICD-10-CM | POA: Diagnosis not present

## 2022-11-04 DIAGNOSIS — F17200 Nicotine dependence, unspecified, uncomplicated: Secondary | ICD-10-CM | POA: Diagnosis not present

## 2022-11-04 DIAGNOSIS — Z9981 Dependence on supplemental oxygen: Secondary | ICD-10-CM | POA: Diagnosis not present

## 2022-11-04 DIAGNOSIS — J441 Chronic obstructive pulmonary disease with (acute) exacerbation: Secondary | ICD-10-CM | POA: Diagnosis not present

## 2022-11-04 DIAGNOSIS — Z79899 Other long term (current) drug therapy: Secondary | ICD-10-CM | POA: Diagnosis not present

## 2022-11-16 DIAGNOSIS — I10 Essential (primary) hypertension: Secondary | ICD-10-CM | POA: Diagnosis not present

## 2022-11-16 DIAGNOSIS — J9601 Acute respiratory failure with hypoxia: Secondary | ICD-10-CM | POA: Diagnosis not present

## 2022-11-20 ENCOUNTER — Other Ambulatory Visit: Payer: Self-pay | Admitting: Thoracic Surgery (Cardiothoracic Vascular Surgery)

## 2022-11-20 DIAGNOSIS — R918 Other nonspecific abnormal finding of lung field: Secondary | ICD-10-CM

## 2022-12-16 DIAGNOSIS — J9601 Acute respiratory failure with hypoxia: Secondary | ICD-10-CM | POA: Diagnosis not present

## 2022-12-16 DIAGNOSIS — I10 Essential (primary) hypertension: Secondary | ICD-10-CM | POA: Diagnosis not present

## 2022-12-23 ENCOUNTER — Ambulatory Visit: Payer: BC Managed Care – PPO | Admitting: Thoracic Surgery (Cardiothoracic Vascular Surgery)

## 2022-12-23 ENCOUNTER — Encounter: Payer: Self-pay | Admitting: Thoracic Surgery (Cardiothoracic Vascular Surgery)

## 2022-12-23 ENCOUNTER — Other Ambulatory Visit: Payer: BC Managed Care – PPO

## 2023-01-16 DIAGNOSIS — I10 Essential (primary) hypertension: Secondary | ICD-10-CM | POA: Diagnosis not present

## 2023-01-16 DIAGNOSIS — J9601 Acute respiratory failure with hypoxia: Secondary | ICD-10-CM | POA: Diagnosis not present

## 2023-02-15 DIAGNOSIS — J9601 Acute respiratory failure with hypoxia: Secondary | ICD-10-CM | POA: Diagnosis not present

## 2023-02-15 DIAGNOSIS — I10 Essential (primary) hypertension: Secondary | ICD-10-CM | POA: Diagnosis not present

## 2023-02-25 DIAGNOSIS — R7303 Prediabetes: Secondary | ICD-10-CM | POA: Diagnosis not present

## 2023-02-25 DIAGNOSIS — R030 Elevated blood-pressure reading, without diagnosis of hypertension: Secondary | ICD-10-CM | POA: Diagnosis not present

## 2023-02-25 DIAGNOSIS — E785 Hyperlipidemia, unspecified: Secondary | ICD-10-CM | POA: Diagnosis not present

## 2023-02-25 DIAGNOSIS — E041 Nontoxic single thyroid nodule: Secondary | ICD-10-CM | POA: Diagnosis not present

## 2023-03-11 DIAGNOSIS — Z0000 Encounter for general adult medical examination without abnormal findings: Secondary | ICD-10-CM | POA: Diagnosis not present

## 2023-03-11 DIAGNOSIS — G43909 Migraine, unspecified, not intractable, without status migrainosus: Secondary | ICD-10-CM | POA: Diagnosis not present

## 2023-03-11 DIAGNOSIS — Z0001 Encounter for general adult medical examination with abnormal findings: Secondary | ICD-10-CM | POA: Diagnosis not present

## 2023-03-11 DIAGNOSIS — Z48812 Encounter for surgical aftercare following surgery on the circulatory system: Secondary | ICD-10-CM | POA: Diagnosis not present

## 2023-03-11 DIAGNOSIS — I350 Nonrheumatic aortic (valve) stenosis: Secondary | ICD-10-CM | POA: Diagnosis not present

## 2023-03-11 DIAGNOSIS — G252 Other specified forms of tremor: Secondary | ICD-10-CM | POA: Diagnosis not present

## 2023-03-11 DIAGNOSIS — R911 Solitary pulmonary nodule: Secondary | ICD-10-CM | POA: Diagnosis not present

## 2023-03-11 DIAGNOSIS — J449 Chronic obstructive pulmonary disease, unspecified: Secondary | ICD-10-CM | POA: Diagnosis not present

## 2023-03-18 DIAGNOSIS — J9601 Acute respiratory failure with hypoxia: Secondary | ICD-10-CM | POA: Diagnosis not present

## 2023-03-18 DIAGNOSIS — I10 Essential (primary) hypertension: Secondary | ICD-10-CM | POA: Diagnosis not present

## 2023-04-17 DIAGNOSIS — L97919 Non-pressure chronic ulcer of unspecified part of right lower leg with unspecified severity: Secondary | ICD-10-CM | POA: Diagnosis not present

## 2023-04-17 DIAGNOSIS — R238 Other skin changes: Secondary | ICD-10-CM | POA: Diagnosis not present

## 2023-04-17 DIAGNOSIS — I83019 Varicose veins of right lower extremity with ulcer of unspecified site: Secondary | ICD-10-CM | POA: Diagnosis not present

## 2023-04-17 DIAGNOSIS — R600 Localized edema: Secondary | ICD-10-CM | POA: Diagnosis not present

## 2023-04-17 DIAGNOSIS — T148XXA Other injury of unspecified body region, initial encounter: Secondary | ICD-10-CM | POA: Diagnosis not present

## 2023-04-18 DIAGNOSIS — J9601 Acute respiratory failure with hypoxia: Secondary | ICD-10-CM | POA: Diagnosis not present

## 2023-04-18 DIAGNOSIS — I10 Essential (primary) hypertension: Secondary | ICD-10-CM | POA: Diagnosis not present

## 2023-04-21 ENCOUNTER — Other Ambulatory Visit: Payer: Self-pay | Admitting: *Deleted

## 2023-04-21 DIAGNOSIS — R209 Unspecified disturbances of skin sensation: Secondary | ICD-10-CM

## 2023-04-23 ENCOUNTER — Ambulatory Visit: Payer: BC Managed Care – PPO | Admitting: Vascular Surgery

## 2023-04-23 ENCOUNTER — Ambulatory Visit (HOSPITAL_COMMUNITY)
Admission: RE | Admit: 2023-04-23 | Discharge: 2023-04-23 | Disposition: A | Discharge status: Home / Self Care | Payer: BC Managed Care – PPO | Source: Ambulatory Visit | Attending: Vascular Surgery | Admitting: Vascular Surgery

## 2023-04-23 ENCOUNTER — Encounter: Payer: Self-pay | Admitting: Vascular Surgery

## 2023-04-23 VITALS — BP 110/75 | HR 58 | Temp 97.9°F | Resp 20 | Ht 69.0 in | Wt 164.0 lb

## 2023-04-23 DIAGNOSIS — R209 Unspecified disturbances of skin sensation: Secondary | ICD-10-CM | POA: Diagnosis not present

## 2023-04-23 DIAGNOSIS — I872 Venous insufficiency (chronic) (peripheral): Secondary | ICD-10-CM

## 2023-04-23 DIAGNOSIS — M7989 Other specified soft tissue disorders: Secondary | ICD-10-CM

## 2023-04-23 LAB — VAS US ABI WITH/WO TBI
Left ABI: 1.04
Right ABI: 1.02

## 2023-04-23 NOTE — Progress Notes (Signed)
Patient ID: Crystal Silva, female   DOB: 11/18/1960, 62 y.o.   MRN: [id number]  Reason for Consult: New Patient (Initial Visit)   Referred by Leone Payor, FNP  Subjective:     HPI:  Crystal Silva is a 62 y.o. female with COPD and long history of smoking.  She does not have any previous vascular history.  She denies any history of DVT but states that her family has a long line of skin discoloration in the legs.  She has a recent history of swelling of the ankles right greater than left.  She has low-grade trauma to both legs with ulcers that has been slow to heal.  The left leg was from a puppy that scratched her leg in June.  She denies having any fevers or chills.  She has been following with a nurse practitioner in Dr. Scharlene Gloss office who is prescribed ointment and bandages for the wounds.  Past Medical History:  Diagnosis Date   COPD (chronic obstructive pulmonary disease) (HCC)    Headache    Heart murmur    Family History  Problem Relation Age of Onset   Heart failure Mother    Heart disease Mother        stent   Past Surgical History:  Procedure Laterality Date   AORTIC VALVE REPLACEMENT N/A 05/22/2021   Procedure: AORTIC VALVE REPLACEMENT (AVR) WITH STERNAL PLATING. 23 MM INSPIRIS RESILIA  AORTIC VALVE.;  Surgeon: [name], MD;  Location: [facility] OR;  Service: Open Heart Surgery;  Laterality: N/A;   CHOLECYSTECTOMY     lt knee surgery     RIGHT/LEFT HEART CATH AND CORONARY ANGIOGRAPHY N/A 04/30/2021   Procedure: RIGHT/LEFT HEART CATH AND CORONARY ANGIOGRAPHY;  Surgeon: Marykay Lex, MD;  Location: [facility] INVASIVE CV LAB;  Service: Cardiovascular;  Laterality: N/A;   TEE WITHOUT CARDIOVERSION N/A 05/22/2021   Procedure: TRANSESOPHAGEAL ECHOCARDIOGRAM (TEE);  Surgeon: [name], MD;  Location: [facility] OR;  Service: Open Heart Surgery;  Laterality: N/A;    Short Social History:  Social History   Tobacco Use   Smoking status: Every Day    Current  packs/day: 1.00    Average packs/day: 1 pack/day for 44.0 years (44.0 ttl pk-yrs)    Types: Cigarettes   Smokeless tobacco: Never   Tobacco comments:    Less then 1 pk 06/27/22  Substance Use Topics   Alcohol use: Yes    Comment: rare    No Known Allergies  Current Outpatient Medications  Medication Sig Dispense Refill   acetaminophen (TYLENOL) 500 MG tablet Take 500 mg by mouth every 6 (six) hours as needed for moderate pain.     albuterol (VENTOLIN HFA) 108 (90 Base) MCG/ACT inhaler Inhale 2 puffs into the lungs every 6 (six) hours as needed for wheezing or shortness of breath. 8 g 2   aspirin EC 325 MG EC tablet Take 1 tablet (325 mg total) by mouth daily.     furosemide (LASIX) 20 MG tablet Take 20 mg by mouth daily.     ibuprofen (ADVIL) 200 MG tablet Take 400-600 mg by mouth every 6 (six) hours as needed for mild pain.     ipratropium-albuterol (DUONEB) 0.5-2.5 (3) MG/3ML SOLN Take 3 mLs by nebulization every 4 (four) hours as needed (Shortness of breath/wheezing). 360 mL 0   metoprolol tartrate (LOPRESSOR) 25 MG tablet Take 1 tablet (25 mg total) by mouth 2 (two) times daily. 180 tablet 3   Multiple Vitamin (MULTIVITAMIN  WITH MINERALS) TABS tablet Take 1 tablet by mouth daily.     sertraline (ZOLOFT) 50 MG tablet Take 50 mg by mouth daily.     TRELEGY ELLIPTA 100-62.5-25 MCG/ACT AEPB Take 1 puff by mouth daily.     No current facility-administered medications for this visit.    Review of Systems  Constitutional:  Constitutional negative. HENT: HENT negative.  Eyes: Eyes negative.  Respiratory: Respiratory negative.  Cardiovascular: Positive for leg swelling.  GI: Gastrointestinal negative.  Musculoskeletal: Musculoskeletal negative.  Skin: Positive for wound.  Neurological: Neurological negative. Psychiatric: Psychiatric negative.        Objective:  Objective   Vitals:   04/23/23 0831  BP: 110/75  Pulse: (!) 58  Resp: 20  Temp: 97.9 F (36.6 C)  SpO2: 92%   Weight: 164 lb (74.4 kg)  Height: 5\' 9"  (1.753 m)   Body mass index is 24.22 kg/m.  Physical Exam HENT:     Head: Normocephalic.     Mouth/Throat:     Mouth: Mucous membranes are moist.  Eyes:     Pupils: Pupils are equal, round, and reactive to light.  Cardiovascular:     Pulses: Normal pulses.  Pulmonary:     Effort: Pulmonary effort is normal.     Breath sounds: Wheezing present.  Abdominal:     General: Abdomen is flat.     Palpations: Abdomen is soft.  Musculoskeletal:     Right lower leg: Edema present.     Left lower leg: No edema.  Skin:    Capillary Refill: Capillary refill takes less than 2 seconds.     Comments: Ulcerations as pictured below  Neurological:     General: No focal deficit present.     Mental Status: She is alert.  Psychiatric:        Mood and Affect: Mood normal.     Data: ABI Findings:  +---------+------------------+-----+---------+--------+  Right   Rt Pressure (mmHg)IndexWaveform Comment   +---------+------------------+-----+---------+--------+  Brachial 127                                       +---------+------------------+-----+---------+--------+  PTA     127               1.00 triphasic          +---------+------------------+-----+---------+--------+  DP      130               1.02 triphasic          +---------+------------------+-----+---------+--------+  Great Toe94                0.74                    +---------+------------------+-----+---------+--------+   +---------+------------------+-----+-----------+-------+  Left    Lt Pressure (mmHg)IndexWaveform   Comment  +---------+------------------+-----+-----------+-------+  Brachial 111                                        +---------+------------------+-----+-----------+-------+  PTA     132               1.04 multiphasic         +---------+------------------+-----+-----------+-------+  DP      125               0.98  multiphasic         +---------+------------------+-----+-----------+-------+  Great Toe72                0.57                     +---------+------------------+-----+-----------+-------+   +-------+-----------+-----------+------------+------------+  ABI/TBIToday's ABIToday's TBIPrevious ABIPrevious TBI  +-------+-----------+-----------+------------+------------+  Right 1.02       0.74                                 +-------+-----------+-----------+------------+------------+  Left  1.04       0.57                                 +-------+-----------+-----------+------------+------------+      Summary:  Right: Resting right ankle-brachial index is within normal range. The  right toe-brachial index is normal.   Left: Resting left ankle-brachial index is within normal range. The left  toe-brachial index is abnormal.       Assessment/Plan:    62 year old female with long history of smoking and bilateral lower extremity ulcerations as pictured above.  She does have palpable pulses bilaterally although I think her wounds are certainly inhibited from healing by her ongoing smoking we discussed the need for urgent smoking cessation or at least a plan to begin to quit.  We also discussed that with her long history of skin changes in her family that appears to be hemosiderin deposition that this is more likely a venous process consistent with C6 venous disease.  We will fit her for thigh-high compression stockings and have her follow-up in 3 months with bilateral lower extremity venous reflux testing.     Maeola Harman MD [facility]

## 2023-04-29 ENCOUNTER — Other Ambulatory Visit: Payer: Self-pay

## 2023-04-29 DIAGNOSIS — I872 Venous insufficiency (chronic) (peripheral): Secondary | ICD-10-CM

## 2023-05-18 DIAGNOSIS — I10 Essential (primary) hypertension: Secondary | ICD-10-CM | POA: Diagnosis not present

## 2023-05-18 DIAGNOSIS — J9601 Acute respiratory failure with hypoxia: Secondary | ICD-10-CM | POA: Diagnosis not present

## 2023-06-10 ENCOUNTER — Ambulatory Visit: Payer: BC Managed Care – PPO | Admitting: Diagnostic Neuroimaging

## 2023-06-11 ENCOUNTER — Encounter: Payer: Self-pay | Admitting: Medical

## 2023-06-11 ENCOUNTER — Ambulatory Visit: Payer: BC Managed Care – PPO | Attending: Medical | Admitting: Medical

## 2023-06-11 VITALS — BP 120/72 | HR 74 | Ht 69.0 in | Wt 167.8 lb

## 2023-06-11 DIAGNOSIS — J449 Chronic obstructive pulmonary disease, unspecified: Secondary | ICD-10-CM

## 2023-06-11 DIAGNOSIS — R600 Localized edema: Secondary | ICD-10-CM | POA: Diagnosis not present

## 2023-06-11 DIAGNOSIS — R0609 Other forms of dyspnea: Secondary | ICD-10-CM | POA: Diagnosis not present

## 2023-06-11 DIAGNOSIS — I10 Essential (primary) hypertension: Secondary | ICD-10-CM | POA: Diagnosis not present

## 2023-06-11 DIAGNOSIS — Z952 Presence of prosthetic heart valve: Secondary | ICD-10-CM

## 2023-06-11 NOTE — Patient Instructions (Signed)
Medication Instructions:  Your physician recommends that you continue on your current medications as directed. Please refer to the Current Medication list given to you today.  *If you need a refill on your cardiac medications before your next appointment, please call your pharmacy*   Lab Work: NONE  If you have labs (blood work) drawn today and your tests are completely normal, you will receive your results only by: MyChart Message (if you have MyChart) OR A paper copy in the mail If you have any lab test that is abnormal or we need to change your treatment, we will call you to review the results.   Testing/Procedures: Your physician has requested that you have an echocardiogram. Echocardiography is a painless test that uses sound waves to create images of your heart. It provides your doctor with information about the size and shape of your heart and how well your heart's chambers and valves are working. This procedure takes approximately one hour. There are no restrictions for this procedure. Please do NOT wear cologne, perfume, aftershave, or lotions (deodorant is allowed). Please arrive 15 minutes prior to your appointment time.    Follow-Up: At [facility], you and your health needs are our priority.  As part of our continuing mission to provide you with exceptional heart care, we have created designated Provider Care Teams.  These Care Teams include your primary Cardiologist (physician) and Advanced Practice Providers (APPs -  Physician Assistants and Nurse Practitioners) who all work together to provide you with the care you need, when you need it.  We recommend signing up for the patient portal called "MyChart".  Sign up information is provided on this After Visit Summary.  MyChart is used to connect with patients for Virtual Visits (Telemedicine).  Patients are able to view lab/test results, encounter notes, upcoming appointments, etc.  Non-urgent messages can be sent to  your provider as well.   To learn more about what you can do with MyChart, go to [email or web address].    Your next appointment:   1 year(s)  Provider:   You may see Dina Rich, MD or one of the following Advanced Practice Providers on your designated Care Team:   Randall An, PA-C  Jacolyn Reedy, [place]     Other Instructions Thank you for choosing  [facility]!

## 2023-06-18 DIAGNOSIS — I10 Essential (primary) hypertension: Secondary | ICD-10-CM | POA: Diagnosis not present

## 2023-06-18 DIAGNOSIS — J9601 Acute respiratory failure with hypoxia: Secondary | ICD-10-CM | POA: Diagnosis not present

## 2023-06-19 ENCOUNTER — Ambulatory Visit (HOSPITAL_COMMUNITY)
Admission: RE | Admit: 2023-06-19 | Discharge: 2023-06-19 | Disposition: A | Discharge status: Home / Self Care | Payer: BC Managed Care – PPO | Source: Ambulatory Visit | Attending: Medical | Admitting: Medical

## 2023-06-19 DIAGNOSIS — R600 Localized edema: Secondary | ICD-10-CM | POA: Insufficient documentation

## 2023-06-19 DIAGNOSIS — J441 Chronic obstructive pulmonary disease with (acute) exacerbation: Secondary | ICD-10-CM | POA: Diagnosis not present

## 2023-06-19 DIAGNOSIS — F17210 Nicotine dependence, cigarettes, uncomplicated: Secondary | ICD-10-CM | POA: Diagnosis not present

## 2023-06-19 DIAGNOSIS — Z7951 Long term (current) use of inhaled steroids: Secondary | ICD-10-CM | POA: Diagnosis not present

## 2023-06-19 LAB — ECHOCARDIOGRAM COMPLETE
AR max vel: 1.05 cm2
AV Area VTI: 1 cm2
AV Area mean vel: 1.05 cm2
AV Mean grad: 17.3 mm[Hg]
AV Peak grad: 34.1 mm[Hg]
Ao pk vel: 2.92 m/s
Area-P 1/2: 4.06 cm2
Calc EF: 62.9 %
MV VTI: 1.98 cm2
S' Lateral: 2.8 cm
Single Plane A2C EF: 60.3 %
Single Plane A4C EF: 64.9 %

## 2023-07-17 DIAGNOSIS — F17210 Nicotine dependence, cigarettes, uncomplicated: Secondary | ICD-10-CM | POA: Diagnosis not present

## 2023-07-17 DIAGNOSIS — J449 Chronic obstructive pulmonary disease, unspecified: Secondary | ICD-10-CM | POA: Diagnosis not present

## 2023-07-18 DIAGNOSIS — J9601 Acute respiratory failure with hypoxia: Secondary | ICD-10-CM | POA: Diagnosis not present

## 2023-07-18 DIAGNOSIS — I10 Essential (primary) hypertension: Secondary | ICD-10-CM | POA: Diagnosis not present

## 2023-07-29 ENCOUNTER — Ambulatory Visit (HOSPITAL_COMMUNITY): Payer: BC Managed Care – PPO | Attending: Vascular Surgery

## 2023-07-29 ENCOUNTER — Ambulatory Visit: Payer: BC Managed Care – PPO | Admitting: Vascular Surgery

## 2023-08-11 ENCOUNTER — Encounter (HOSPITAL_COMMUNITY): Payer: Self-pay

## 2023-08-11 ENCOUNTER — Other Ambulatory Visit: Payer: Self-pay

## 2023-08-11 ENCOUNTER — Inpatient Hospital Stay (HOSPITAL_COMMUNITY)
Admission: EM | Admit: 2023-08-11 | Discharge: 2023-08-13 | DRG: 190 | Disposition: A | Discharge status: Home / Self Care | Payer: BC Managed Care – PPO | Attending: Internal Medicine | Admitting: Internal Medicine

## 2023-08-11 ENCOUNTER — Emergency Department (HOSPITAL_COMMUNITY): Payer: BC Managed Care – PPO

## 2023-08-11 DIAGNOSIS — Z9981 Dependence on supplemental oxygen: Secondary | ICD-10-CM | POA: Diagnosis not present

## 2023-08-11 DIAGNOSIS — D696 Thrombocytopenia, unspecified: Secondary | ICD-10-CM | POA: Diagnosis present

## 2023-08-11 DIAGNOSIS — Z7982 Long term (current) use of aspirin: Secondary | ICD-10-CM

## 2023-08-11 DIAGNOSIS — R0602 Shortness of breath: Secondary | ICD-10-CM | POA: Diagnosis not present

## 2023-08-11 DIAGNOSIS — Z953 Presence of xenogenic heart valve: Secondary | ICD-10-CM

## 2023-08-11 DIAGNOSIS — R918 Other nonspecific abnormal finding of lung field: Secondary | ICD-10-CM | POA: Diagnosis not present

## 2023-08-11 DIAGNOSIS — I959 Hypotension, unspecified: Secondary | ICD-10-CM | POA: Diagnosis not present

## 2023-08-11 DIAGNOSIS — Z8249 Family history of ischemic heart disease and other diseases of the circulatory system: Secondary | ICD-10-CM

## 2023-08-11 DIAGNOSIS — Z1152 Encounter for screening for COVID-19: Secondary | ICD-10-CM

## 2023-08-11 DIAGNOSIS — J9621 Acute and chronic respiratory failure with hypoxia: Secondary | ICD-10-CM | POA: Diagnosis not present

## 2023-08-11 DIAGNOSIS — E872 Acidosis, unspecified: Secondary | ICD-10-CM | POA: Diagnosis not present

## 2023-08-11 DIAGNOSIS — F17210 Nicotine dependence, cigarettes, uncomplicated: Secondary | ICD-10-CM | POA: Diagnosis not present

## 2023-08-11 DIAGNOSIS — J440 Chronic obstructive pulmonary disease with acute lower respiratory infection: Secondary | ICD-10-CM | POA: Diagnosis present

## 2023-08-11 DIAGNOSIS — R739 Hyperglycemia, unspecified: Secondary | ICD-10-CM | POA: Diagnosis not present

## 2023-08-11 DIAGNOSIS — I4891 Unspecified atrial fibrillation: Secondary | ICD-10-CM | POA: Diagnosis present

## 2023-08-11 DIAGNOSIS — J441 Chronic obstructive pulmonary disease with (acute) exacerbation: Secondary | ICD-10-CM | POA: Diagnosis not present

## 2023-08-11 DIAGNOSIS — Z952 Presence of prosthetic heart valve: Secondary | ICD-10-CM

## 2023-08-11 DIAGNOSIS — Z79899 Other long term (current) drug therapy: Secondary | ICD-10-CM | POA: Diagnosis not present

## 2023-08-11 DIAGNOSIS — J181 Lobar pneumonia, unspecified organism: Secondary | ICD-10-CM | POA: Diagnosis present

## 2023-08-11 DIAGNOSIS — I10 Essential (primary) hypertension: Secondary | ICD-10-CM | POA: Diagnosis present

## 2023-08-11 LAB — PROCALCITONIN: Procalcitonin: <0.1 ng/mL

## 2023-08-11 LAB — CBC
HCT: 46.8 % — ABNORMAL HIGH (ref 36.0–46.0)
Hemoglobin: 14.9 g/dL (ref 12.0–15.0)
MCH: 31.6 pg (ref 26.0–34.0)
MCHC: 31.8 g/dL (ref 30.0–36.0)
MCV: 99.4 fL (ref 80.0–100.0)
Platelets: 124 10*3/uL — ABNORMAL LOW (ref 150–400)
RBC: 4.71 MIL/uL (ref 3.87–5.11)
RDW: 13.5 % (ref 11.5–15.5)
WBC: 7.2 10*3/uL (ref 4.0–10.5)
nRBC: 0 % (ref 0.0–0.2)

## 2023-08-11 LAB — RESPIRATORY PANEL BY PCR

## 2023-08-11 LAB — SARS CORONAVIRUS 2 BY RT PCR: SARS Coronavirus 2 by RT PCR: NEGATIVE

## 2023-08-11 LAB — GLUCOSE, CAPILLARY
Glucose-Capillary: 150 mg/dL — ABNORMAL HIGH (ref 70–99)
Glucose-Capillary: 180 mg/dL — ABNORMAL HIGH (ref 70–99)

## 2023-08-11 LAB — BASIC METABOLIC PANEL
Anion gap: 8 (ref 5–15)
BUN: 22 mg/dL (ref 8–23)
CO2: 31 mmol/L (ref 22–32)
Calcium: 9.1 mg/dL (ref 8.9–10.3)
Chloride: 100 mmol/L (ref 98–111)
Creatinine, Ser: 0.51 mg/dL (ref 0.44–1.00)
GFR, Estimated: >60 mL/min (ref >60)
Glucose, Bld: 203 mg/dL — ABNORMAL HIGH (ref 70–99)
Potassium: 3.7 mmol/L (ref 3.5–5.1)
Sodium: 139 mmol/L (ref 135–145)

## 2023-08-11 LAB — BLOOD GAS, VENOUS
Acid-Base Excess: 5.1 mmol/L — ABNORMAL HIGH (ref 0.0–2.0)
Bicarbonate: 31.5 mmol/L — ABNORMAL HIGH (ref 20.0–28.0)
Drawn by: 8368
O2 Saturation: 97.6 %
Patient temperature: 36.9
pCO2, Ven: 52 mm[Hg] (ref 44–60)
pH, Ven: 7.39 (ref 7.25–7.43)
pO2, Ven: 82 mm[Hg] — ABNORMAL HIGH (ref 32–45)

## 2023-08-11 LAB — HEMOGLOBIN A1C
Hgb A1c MFr Bld: 5.6 % (ref 4.8–5.6)
Mean Plasma Glucose: 114 mg/dL

## 2023-08-11 LAB — LACTIC ACID, PLASMA: Lactic Acid, Venous: 2.2 mmol/L (ref 0.5–1.9)

## 2023-08-11 MED ORDER — BUDESONIDE 0.5 MG/2ML IN SUSP
0.5000 mg | Freq: Two times a day (BID) | RESPIRATORY_TRACT | Status: DC
  Administered 2023-08-11 – 2023-08-13 (×4): 0.5 mg via RESPIRATORY_TRACT
  Filled 2023-08-11 (×4): qty 2

## 2023-08-11 MED ORDER — SERTRALINE HCL 50 MG PO TABS
50.0000 mg | ORAL_TABLET | Freq: Every day | ORAL | Status: DC
  Administered 2023-08-12 – 2023-08-13 (×2): 50 mg via ORAL
  Filled 2023-08-11 (×3): qty 1

## 2023-08-11 MED ORDER — INSULIN ASPART 100 UNIT/ML IJ SOLN
0.0000 [IU] | Freq: Three times a day (TID) | INTRAMUSCULAR | Status: DC
  Administered 2023-08-12: 2 [IU] via SUBCUTANEOUS
  Administered 2023-08-12 – 2023-08-13 (×2): 1 [IU] via SUBCUTANEOUS

## 2023-08-11 MED ORDER — ALBUTEROL SULFATE (2.5 MG/3ML) 0.083% IN NEBU
5.0000 mg | INHALATION_SOLUTION | Freq: Once | RESPIRATORY_TRACT | Status: AC
  Administered 2023-08-11: 5 mg via RESPIRATORY_TRACT
  Filled 2023-08-11: qty 6

## 2023-08-11 MED ORDER — SODIUM CHLORIDE 0.9 % IV SOLN
500.0000 mg | INTRAVENOUS | Status: DC
  Administered 2023-08-11 – 2023-08-12 (×2): 500 mg via INTRAVENOUS
  Filled 2023-08-11 (×2): qty 5

## 2023-08-11 MED ORDER — ARFORMOTEROL TARTRATE 15 MCG/2ML IN NEBU
15.0000 ug | INHALATION_SOLUTION | Freq: Two times a day (BID) | RESPIRATORY_TRACT | Status: DC
  Administered 2023-08-11 – 2023-08-13 (×4): 15 ug via RESPIRATORY_TRACT
  Filled 2023-08-11 (×4): qty 2

## 2023-08-11 MED ORDER — IPRATROPIUM-ALBUTEROL 0.5-2.5 (3) MG/3ML IN SOLN
3.0000 mL | Freq: Once | RESPIRATORY_TRACT | Status: AC
  Administered 2023-08-11: 3 mL via RESPIRATORY_TRACT
  Filled 2023-08-11: qty 3

## 2023-08-11 MED ORDER — IPRATROPIUM-ALBUTEROL 0.5-2.5 (3) MG/3ML IN SOLN
3.0000 mL | Freq: Four times a day (QID) | RESPIRATORY_TRACT | Status: DC
  Administered 2023-08-11 – 2023-08-13 (×7): 3 mL via RESPIRATORY_TRACT
  Filled 2023-08-11 (×6): qty 3

## 2023-08-11 MED ORDER — IPRATROPIUM BROMIDE 0.02 % IN SOLN
0.5000 mg | Freq: Once | RESPIRATORY_TRACT | Status: AC
  Administered 2023-08-11: 0.5 mg via RESPIRATORY_TRACT
  Filled 2023-08-11: qty 2.5

## 2023-08-11 MED ORDER — METHYLPREDNISOLONE SODIUM SUCC 125 MG IJ SOLR
125.0000 mg | Freq: Once | INTRAMUSCULAR | Status: AC
  Administered 2023-08-11: 125 mg via INTRAVENOUS
  Filled 2023-08-11: qty 2

## 2023-08-11 MED ORDER — ENOXAPARIN SODIUM 40 MG/0.4ML IJ SOSY
40.0000 mg | PREFILLED_SYRINGE | INTRAMUSCULAR | Status: DC
  Administered 2023-08-11 – 2023-08-12 (×2): 40 mg via SUBCUTANEOUS
  Filled 2023-08-11 (×2): qty 0.4

## 2023-08-11 MED ORDER — ASPIRIN 325 MG PO TBEC
325.0000 mg | DELAYED_RELEASE_TABLET | Freq: Every day | ORAL | Status: DC
  Administered 2023-08-12 – 2023-08-13 (×2): 325 mg via ORAL
  Filled 2023-08-11 (×3): qty 1

## 2023-08-11 MED ORDER — INSULIN ASPART 100 UNIT/ML IJ SOLN: 0.0000 [IU] | Freq: Every day | INTRAMUSCULAR | Status: DC

## 2023-08-11 MED ORDER — METHYLPREDNISOLONE SODIUM SUCC 40 MG IJ SOLR
40.0000 mg | Freq: Two times a day (BID) | INTRAMUSCULAR | Status: DC
  Administered 2023-08-11 – 2023-08-13 (×4): 40 mg via INTRAVENOUS
  Filled 2023-08-11 (×4): qty 1

## 2023-08-11 MED ORDER — SODIUM CHLORIDE 0.9 % IV SOLN
1.0000 g | Freq: Once | INTRAVENOUS | Status: AC
  Administered 2023-08-11: 1 g via INTRAVENOUS
  Filled 2023-08-11: qty 10

## 2023-08-11 NOTE — ED Triage Notes (Signed)
Pt BIB ems for COPD exacerbation. Pt was in the vehicle and feeling SOB. Per EMS pt normally on 3L of oxygen and was 94-96% 3L [place]. Pt given 6m albuterol. Pt appears struggling for breath at this time during triage.

## 2023-08-11 NOTE — ED Provider Notes (Signed)
 [place] EMERGENCY DEPARTMENT AT [facility] Provider Note   CSN: [id number] Arrival date & time: 08/11/23  0831     History  Chief Complaint  Patient presents with   Shortness of Breath         Crystal Silva is a 62 y.o. female.  62 year old female presenting emergency department for shortness of breath.  Patient with history of COPD on 3 L oxygen .  Brought in by EMS with significant increased work of breathing.  Reports worsening shortness of breath for the past day or 2 increased sputum production change in color.  This morning when she went to car could not catch her breath.   Shortness of Breath      Home Medications Prior to Admission medications   Medication Sig Start Date End Date Taking? Authorizing Provider  acetaminophen  (TYLENOL ) 500 MG tablet Take 500 mg by mouth every 6 (six) hours as needed for moderate pain.    [provider]  albuterol  (VENTOLIN  HFA) 108 (90 Base) MCG/ACT inhaler Inhale 2 puffs into the lungs every 6 (six) hours as needed for wheezing or shortness of breath. 05/31/21   Raenelle Coria, MD  aspirin  EC 325 MG EC tablet Take 1 tablet (325 mg total) by mouth daily. 05/26/21   Gold, Wayne E, PA-C  furosemide  (LASIX ) 20 MG tablet Take 20 mg by mouth daily as needed. 04/17/23   [provider]  ibuprofen  (ADVIL ) 200 MG tablet Take 400-600 mg by mouth every 6 (six) hours as needed for mild pain.    [provider]  ipratropium-albuterol  (DUONEB) 0.5-2.5 (3) MG/3ML SOLN Take 3 mLs by nebulization every 4 (four) hours as needed (Shortness of breath/wheezing). 06/17/22   Cheryle Page, MD  metoprolol  tartrate (LOPRESSOR ) 25 MG tablet Take 1 tablet (25 mg total) by mouth 2 (two) times daily. 05/28/21 06/11/23  Richarda Prentice LITTIE Mickey., NP  Multiple Vitamin (MULTIVITAMIN WITH MINERALS) TABS tablet Take 1 tablet by mouth daily.    [provider]  mupirocin ointment (BACTROBAN) 2 % Apply 1 Application topically.  04/17/23   [provider]  sertraline  (ZOLOFT ) 50 MG tablet Take 50 mg by mouth daily.    [provider]  DOMINIC BECK 100-62.5-25 MCG/ACT AEPB Take 1 puff by mouth daily. 03/23/23   [provider]      Allergies    Patient has no known allergies.    Review of Systems   Review of Systems  Respiratory:  Positive for shortness of breath.     Physical Exam Updated Vital Signs BP (!) 108/59   Pulse 82   Temp 98.5 F (36.9 C) (Oral)   Resp 20   Ht 5' 9 (1.753 m)   Wt 76.1 kg   LMP 12/02/2013   SpO2 97%   BMI 24.78 kg/m  Physical Exam Vitals reviewed.  Constitutional:      General: She is in acute distress.  Cardiovascular:     Rate and Rhythm: Normal rate and regular rhythm.  Pulmonary:     Comments: Patient in moderate respiratory distress and increased work of breathing, poor air movement diffuse wheezing.  Maintaining saturation on her home oxygen . Musculoskeletal:        General: Normal range of motion.     Cervical back: Normal range of motion.  Skin:    General: Skin is warm and dry.  Neurological:     General: No focal deficit present.     Mental Status: She is alert.  Psychiatric:        Mood and Affect: Mood normal.        Behavior: Behavior normal.     ED Results / Procedures / Treatments   Labs (all labs ordered are listed, but only abnormal results are displayed) Labs Reviewed  CBC - Abnormal; Notable for the following components:      Result Value   HCT 46.8 (*)    Platelets 124 (*)    All other components within normal limits  BASIC METABOLIC PANEL - Abnormal; Notable for the following components:   Glucose, Bld 203 (*)    All other components within normal limits  LACTIC ACID, PLASMA - Abnormal; Notable for the following components:   Lactic Acid, Venous 2.2 (*)    All other components within normal limits  GLUCOSE, CAPILLARY - Abnormal; Notable for the following components:   Glucose-Capillary 150 (*)    All  other components within normal limits  SARS CORONAVIRUS 2 BY RT PCR  CULTURE, BLOOD (ROUTINE X 2)  CULTURE, BLOOD (ROUTINE X 2)  RESPIRATORY PANEL BY PCR  PROCALCITONIN  HEMOGLOBIN A1C  HIV ANTIBODY (ROUTINE TESTING W REFLEX)  URINALYSIS, W/ REFLEX TO CULTURE (INFECTION SUSPECTED)  BLOOD GAS, VENOUS    EKG EKG Interpretation Date/Time:  Tuesday August 11 2023 08:42:58 EST Ventricular Rate:  81 PR Interval:  156 QRS Duration:  93 QT Interval:  403 QTC Calculation: 468 R Axis:   20  Text Interpretation: Sinus rhythm Low voltage, extremity and precordial leads Confirmed by Neysa Clap [phone]) on 08/11/2023 1:43:00 PM  Radiology DG Chest Portable 1 View Result Date: 08/11/2023 CLINICAL DATA:  Shortness of breath EXAM: PORTABLE CHEST 1 VIEW COMPARISON:  Chest radiograph dated 06/12/2022 FINDINGS: Normal lung volumes. Diffuse bilateral interstitial opacities. Bibasilar patchy opacities. No pleural effusion or pneumothorax. The heart size and mediastinal contours are within normal limits. Median sternotomy wires are nondisplaced. IMPRESSION: Diffuse bilateral interstitial opacities and bibasilar patchy opacities, which may represent pulmonary edema or pneumonia. Electronically Signed   By: Limin  Xu M.D.   On: 08/11/2023 09:31    Procedures Procedures    Medications Ordered in ED Medications  azithromycin  (ZITHROMAX ) 500 mg in sodium chloride  0.9 % 250 mL IVPB (0 mg Intravenous Stopped 08/11/23 1427)  enoxaparin  (LOVENOX ) injection 40 mg (has no administration in time range)  methylPREDNISolone  sodium succinate (SOLU-MEDROL ) 40 mg/mL injection 40 mg (has no administration in time range)  ipratropium-albuterol  (DUONEB) 0.5-2.5 (3) MG/3ML nebulizer solution 3 mL (3 mLs Nebulization Given 08/11/23 1518)  budesonide  (PULMICORT ) nebulizer solution 0.5 mg (has no administration in time range)  arformoterol  (BROVANA ) nebulizer solution 15 mcg (has no administration in time range)   aspirin  EC tablet 325 mg (has no administration in time range)  sertraline  (ZOLOFT ) tablet 50 mg (has no administration in time range)  insulin  aspart (novoLOG ) injection 0-9 Units (has no administration in time range)  insulin  aspart (novoLOG ) injection 0-5 Units (has no administration in time range)  methylPREDNISolone  sodium succinate (SOLU-MEDROL ) 125 mg/2 mL injection 125 mg (125 mg Intravenous Given 08/11/23 0900)  albuterol  (PROVENTIL ) (2.5 MG/3ML) 0.083% nebulizer solution 5 mg (5 mg Nebulization Given 08/11/23 0900)  ipratropium (ATROVENT ) nebulizer solution 0.5 mg (0.5 mg Nebulization Given 08/11/23 0901)  ipratropium-albuterol  (DUONEB) 0.5-2.5 (3) MG/3ML nebulizer solution 3 mL (3 mLs Nebulization Given 08/11/23 1030)  cefTRIAXone  (ROCEPHIN ) 1 g in sodium chloride  0.9 % 100 mL IVPB (0 g Intravenous Stopped 08/11/23 1320)    ED Course/ Medical Decision Making/ A&P  Clinical Course as of 08/11/23 1611  Tue Aug 11, 2023  1010 Patient feeling much improved and would like to go home.  Chest x-ray with what appears to be developing pneumonia.  He has no fever or tachycardia. On baseline oxygen . Will trial ambulate [TY]    Clinical Course User Index [TY] Neysa Caron PARAS, DO                                 Medical Decision Making 62 year old female presenting emergency department for shortness of breath in setting of COPD.  Per chart review appears that she is on 3 L.  EMS reported stable vital signs, gave albuterol  5 mg prior to arrival.  Patient in moderate respiratory distress, maintaining her oxygen  saturation on room air.  Given breathing treatments with improvement of her respiratory status.  However, with ambulation she desatted 87%.  Chest x-ray concerning for pneumonia.  Follow-up labs with no leukocytosis, but mildly elevated lactate.  No significant metabolic derangements.  Given patient's age and increasing oxygen  requirements in the setting of likely pneumonia.  Will admit for  pneumonia and COPD exacerbation.  Amount and/or Complexity of Data Reviewed Labs: ordered. Radiology: ordered.  Risk Prescription drug management. Decision regarding hospitalization.          Final Clinical Impression(s) / ED Diagnoses Final diagnoses:  COPD exacerbation [facility])    Rx / DC Orders ED Discharge Orders     None         Neysa Caron PARAS, DO 08/11/23 1611

## 2023-08-11 NOTE — Hospital Course (Signed)
 62 year old female with a history of COPD, chronic respiratory failure on 3 L, aortic stenosis status post bioprosthetic valve replacement 05/2021, postoperative atrial fibrillation, tobacco abuse presenting with 1 week history of shortness of breath that significantly worsened over the last 24 hours.  She states that she lives with her grandson with whom has had some upper respiratory illness over the past week.  Unfortunately, she continues to smoke 1/2 pack/day.  She denies any fevers, chills, headache, neck pain, hemoptysis, nausea, vomiting, diarrhea.  She has some chest discomfort with coughing.  There is no dysuria or hematuria, hematochezia, melena.  She used her nebulizer at home without much relief.  EMS was activated.  The patient was noted to be 87% saturation on her usual oxygen .  She was initially placed on 15 L. In the ED, the patient was afebrile hemodynamically stable with oxygen  saturation 94% on 3 L.  WBC 9.2, hemoglobin 14.9, platelets 124.  Sodium 139, potassium 3.7, bicarbonate 31, serum creatinine 0.51.  Lactic acid 2.2.  EKG shows sinus rhythm with nonspecific T wave changes.  Chest x-ray showed increased interstitial opacities.  The patient was started on ceftriaxone , azithromycin , and Solu-Medrol .  She was given bronchodilators.  She was mated for further evaluation and treatment of her pneumonia and COPD exacerbation.

## 2023-08-11 NOTE — ED Notes (Addendum)
 CRITICAL VALUE STICKER  CRITICAL VALUE: lactic 2.2  RECEIVER (on-site recipient of call): Vicenta Serge, RN  DATE & TIME NOTIFIED: 12/31 1329  MESSENGER (representative from lab): [place]  MD NOTIFIED: Caron Salt, DO  TIME OF NOTIFICATION: 1335

## 2023-08-11 NOTE — H&P (Signed)
 History and Physical    Patient: Crystal Silva FMW:[id number] DOB: [birth year] DOA: 08/11/2023 DOS: the patient was seen and examined on 08/11/2023 PCP: Shona Norleen PEDLAR, MD  Patient coming from: Home  Chief Complaint:  Chief Complaint  Patient presents with   Shortness of Breath        HPI: Crystal Silva is a 62 year old female with a history of COPD, chronic respiratory failure on 3 L, aortic stenosis status post bioprosthetic valve replacement 05/2021, postoperative atrial fibrillation, tobacco abuse presenting with 1 week history of shortness of breath that significantly worsened over the last 24 hours.  She states that she lives with her grandson with whom has had some upper respiratory illness over the past week.  Unfortunately, she continues to smoke 1/2 pack/day.  She denies any fevers, chills, headache, neck pain, hemoptysis, nausea, vomiting, diarrhea.  She has some chest discomfort with coughing.  There is no dysuria or hematuria, hematochezia, melena.  She used her nebulizer at home without much relief.  EMS was activated.  The patient was noted to be 87% saturation on her usual oxygen .  She was initially placed on 15 L. In the ED, the patient was afebrile hemodynamically stable with oxygen  saturation 94% on 3 L.  WBC 9.2, hemoglobin 14.9, platelets 124.  Sodium 139, potassium 3.7, bicarbonate 31, serum creatinine 0.51.  Lactic acid 2.2.  EKG shows sinus rhythm with nonspecific T wave changes.  Chest x-ray showed increased interstitial opacities.  The patient was started on ceftriaxone , azithromycin , and Solu-Medrol .  She was given bronchodilators.  She was mated for further evaluation and treatment of her pneumonia and COPD exacerbation.  Review of Systems: As mentioned in the history of present illness. All other systems reviewed and are negative. Past Medical History:  Diagnosis Date   COPD (chronic obstructive pulmonary disease) (HCC)    Headache    Heart murmur    Past Surgical  History:  Procedure Laterality Date   AORTIC VALVE REPLACEMENT N/A 05/22/2021   Procedure: AORTIC VALVE REPLACEMENT (AVR) WITH STERNAL PLATING. 23 MM INSPIRIS RESILIA  AORTIC VALVE.;  Surgeon: Kerrin Elspeth BROCKS, MD;  Location: [facility] OR;  Service: Open Heart Surgery;  Laterality: N/A;   CHOLECYSTECTOMY     lt knee surgery     RIGHT/LEFT HEART CATH AND CORONARY ANGIOGRAPHY N/A 04/30/2021   Procedure: RIGHT/LEFT HEART CATH AND CORONARY ANGIOGRAPHY;  Surgeon: Anner Alm ORN, MD;  Location: [facility] INVASIVE CV LAB;  Service: Cardiovascular;  Laterality: N/A;   TEE WITHOUT CARDIOVERSION N/A 05/22/2021   Procedure: TRANSESOPHAGEAL ECHOCARDIOGRAM (TEE);  Surgeon: Kerrin Elspeth BROCKS, MD;  Location: [facility] OR;  Service: Open Heart Surgery;  Laterality: N/A;   Social History:  reports that she has been smoking cigarettes. She has a 44 pack-year smoking history. She has never used smokeless tobacco. She reports current alcohol use. She reports that she does not use drugs.  No Known Allergies  Family History  Problem Relation Age of Onset   Heart failure Mother    Heart disease Mother        stent    Prior to Admission medications   Medication Sig Start Date End Date Taking? Authorizing Provider  acetaminophen  (TYLENOL ) 500 MG tablet Take 500 mg by mouth every 6 (six) hours as needed for moderate pain.    [provider]  albuterol  (VENTOLIN  HFA) 108 (90 Base) MCG/ACT inhaler Inhale 2 puffs into the lungs every 6 (six) hours as needed for wheezing or shortness of breath. 05/31/21  Raenelle Coria, MD  aspirin  EC 325 MG EC tablet Take 1 tablet (325 mg total) by mouth daily. 05/26/21   Gold, Wayne E, PA-C  furosemide  (LASIX ) 20 MG tablet Take 20 mg by mouth daily as needed. 04/17/23   [provider]  ibuprofen  (ADVIL ) 200 MG tablet Take 400-600 mg by mouth every 6 (six) hours as needed for mild pain.    [provider]  ipratropium-albuterol  (DUONEB) 0.5-2.5 (3) MG/3ML SOLN Take 3  mLs by nebulization every 4 (four) hours as needed (Shortness of breath/wheezing). 06/17/22   Cheryle Page, MD  metoprolol  tartrate (LOPRESSOR ) 25 MG tablet Take 1 tablet (25 mg total) by mouth 2 (two) times daily. 05/28/21 06/11/23  Richarda Prentice LITTIE Mickey., NP  Multiple Vitamin (MULTIVITAMIN WITH MINERALS) TABS tablet Take 1 tablet by mouth daily.    [provider]  mupirocin ointment (BACTROBAN) 2 % Apply 1 Application topically. 04/17/23   [provider]  sertraline  (ZOLOFT ) 50 MG tablet Take 50 mg by mouth daily.    [provider]  DOMINIC BECK 100-62.5-25 MCG/ACT AEPB Take 1 puff by mouth daily. 03/23/23   [provider]    Physical Exam: Vitals:   08/11/23 1300 08/11/23 1315 08/11/23 1345 08/11/23 1415  BP: 121/85 133/79 121/64 108/68  Pulse: 84 78 88 90  Resp: (!) 34 (!) 24 (!) 22 19  Temp:      TempSrc:      SpO2: 94% 95% 96% 96%  Weight:      Height:       GENERAL:  A&O x 3, NAD, well developed, cooperative, follows commands HEENT: [place]/AT, No thrush, No icterus, No oral ulcers Neck:  No neck mass, No meningismus, soft, supple CV: RRR, no S3, no S4, no rub, no JVD Lungs: Diminished breath sounds.  Bilateral rales.  Bilateral expiratory wheeze. Abd: soft/NT +BS, nondistended Ext: No edema, no lymphangitis, no cyanosis, no rashes Neuro:  CN II-XII intact, strength 4/5 in RUE, RLE, strength 4/5 LUE, LLE; sensation intact bilateral; no dysmetria; babinski equivocal  Data Reviewed: Data reviewed above in history  Assessment and Plan: Acute on chronic respiratory failure with hypoxia -Secondary to COPD exacerbation and pneumonia -Initially on 15 L -Went back to baseline 3 L as tolerated -Obtain VBG  COPD exacerbation -Start Brovana  -Start Pulmicort  -Start DuoNebs -Continue IV Solu-Medrol  -check COVID-19 -PCT -viral respiratory panel  Lobar pneumonia -Personally reviewed chest x-ray--right greater than left lower lobe  opacity -Check PCT -Continue ceftriaxone  and azithromycin   Lactic acidosis -doubt sepsis -due to hypoxia -blood cultures x 2 -UA  Tobacco abuse -Tobacco cessation discussed  Status post bioprosthetic aortic valve replacement -Follow-up with Dr. Kerrin  Essential hypertension -Continue metoprolol   Hyperglycemia -check A1C     Advance Care Planning: FULL  Consults: none  Family Communication: none  Severity of Illness: The appropriate patient status for this patient is OBSERVATION. Observation status is judged to be reasonable and necessary in order to provide the required intensity of service to ensure the patient's safety. The patient's presenting symptoms, physical exam findings, and initial radiographic and laboratory data in the context of their medical condition is felt to place them at decreased risk for further clinical deterioration. Furthermore, it is anticipated that the patient will be medically stable for discharge from the hospital within 2 midnights of admission.   Author: Alm Schneider, MD 08/11/2023 2:28 PM  For on call review [email or web address].

## 2023-08-11 NOTE — Progress Notes (Signed)
 Patient refused her aspirin and sertraline states that she took them this morning., She also did not want SSI.

## 2023-08-11 NOTE — ED Notes (Signed)
Pt ambulated with pulse ox. Pt complained of dizziness, sob. Pts o2 was 87 at the lowest and 94 at the highest. Pulse rate was 104.

## 2023-08-11 NOTE — Plan of Care (Signed)
  Problem: Activity: Goal: Risk for activity intolerance will decrease Outcome: Progressing   Problem: Coping: Goal: Level of anxiety will decrease Outcome: Progressing   Problem: Pain Management: Goal: General experience of comfort will improve Outcome: Progressing

## 2023-08-11 NOTE — Plan of Care (Signed)
   Problem: Education: Goal: Knowledge of General Education information will improve Description Including pain rating scale, medication(s)/side effects and non-pharmacologic comfort measures Outcome: Progressing   Problem: Health Behavior/Discharge Planning: Goal: Ability to manage health-related needs will improve Outcome: Progressing

## 2023-08-12 DIAGNOSIS — I10 Essential (primary) hypertension: Secondary | ICD-10-CM | POA: Diagnosis present

## 2023-08-12 DIAGNOSIS — J441 Chronic obstructive pulmonary disease with (acute) exacerbation: Secondary | ICD-10-CM | POA: Diagnosis present

## 2023-08-12 DIAGNOSIS — Z1152 Encounter for screening for COVID-19: Secondary | ICD-10-CM | POA: Diagnosis not present

## 2023-08-12 DIAGNOSIS — D696 Thrombocytopenia, unspecified: Secondary | ICD-10-CM | POA: Diagnosis present

## 2023-08-12 DIAGNOSIS — Z9981 Dependence on supplemental oxygen: Secondary | ICD-10-CM | POA: Diagnosis not present

## 2023-08-12 DIAGNOSIS — J181 Lobar pneumonia, unspecified organism: Secondary | ICD-10-CM | POA: Diagnosis present

## 2023-08-12 DIAGNOSIS — I4891 Unspecified atrial fibrillation: Secondary | ICD-10-CM | POA: Diagnosis present

## 2023-08-12 DIAGNOSIS — J9621 Acute and chronic respiratory failure with hypoxia: Secondary | ICD-10-CM | POA: Diagnosis present

## 2023-08-12 DIAGNOSIS — E872 Acidosis, unspecified: Secondary | ICD-10-CM | POA: Diagnosis present

## 2023-08-12 DIAGNOSIS — Z79899 Other long term (current) drug therapy: Secondary | ICD-10-CM | POA: Diagnosis not present

## 2023-08-12 DIAGNOSIS — Z8249 Family history of ischemic heart disease and other diseases of the circulatory system: Secondary | ICD-10-CM | POA: Diagnosis not present

## 2023-08-12 DIAGNOSIS — Z7982 Long term (current) use of aspirin: Secondary | ICD-10-CM | POA: Diagnosis not present

## 2023-08-12 DIAGNOSIS — R739 Hyperglycemia, unspecified: Secondary | ICD-10-CM | POA: Diagnosis present

## 2023-08-12 DIAGNOSIS — F17210 Nicotine dependence, cigarettes, uncomplicated: Secondary | ICD-10-CM | POA: Diagnosis present

## 2023-08-12 DIAGNOSIS — Z953 Presence of xenogenic heart valve: Secondary | ICD-10-CM | POA: Diagnosis not present

## 2023-08-12 DIAGNOSIS — J440 Chronic obstructive pulmonary disease with acute lower respiratory infection: Secondary | ICD-10-CM | POA: Diagnosis present

## 2023-08-12 LAB — URINALYSIS, W/ REFLEX TO CULTURE (INFECTION SUSPECTED)
Bilirubin Urine: NEGATIVE
Glucose, UA: NEGATIVE mg/dL
Ketones, ur: NEGATIVE mg/dL
Nitrite: NEGATIVE
Protein, ur: 30 mg/dL — AB
Specific Gravity, Urine: 1.025 (ref 1.005–1.030)
pH: 5 (ref 5.0–8.0)

## 2023-08-12 LAB — CBC
HCT: 42.7 % (ref 36.0–46.0)
Hemoglobin: 14 g/dL (ref 12.0–15.0)
MCH: 32.6 pg (ref 26.0–34.0)
MCHC: 32.8 g/dL (ref 30.0–36.0)
MCV: 99.3 fL (ref 80.0–100.0)
Platelets: 125 10*3/uL — ABNORMAL LOW (ref 150–400)
RBC: 4.3 MIL/uL (ref 3.87–5.11)
RDW: 13.6 % (ref 11.5–15.5)
WBC: 5.8 10*3/uL (ref 4.0–10.5)
nRBC: 0 % (ref 0.0–0.2)

## 2023-08-12 LAB — GLUCOSE, CAPILLARY
Glucose-Capillary: 115 mg/dL — ABNORMAL HIGH (ref 70–99)
Glucose-Capillary: 162 mg/dL — ABNORMAL HIGH (ref 70–99)
Glucose-Capillary: 132 mg/dL — ABNORMAL HIGH (ref 70–99)
Glucose-Capillary: 145 mg/dL — ABNORMAL HIGH (ref 70–99)

## 2023-08-12 LAB — BASIC METABOLIC PANEL
Anion gap: 9 (ref 5–15)
BUN: 21 mg/dL (ref 8–23)
CO2: 33 mmol/L — ABNORMAL HIGH (ref 22–32)
Calcium: 9.3 mg/dL (ref 8.9–10.3)
Chloride: 101 mmol/L (ref 98–111)
Creatinine, Ser: 0.36 mg/dL — ABNORMAL LOW (ref 0.44–1.00)
GFR, Estimated: >60 mL/min (ref >60)
Glucose, Bld: 140 mg/dL — ABNORMAL HIGH (ref 70–99)
Potassium: 4.1 mmol/L (ref 3.5–5.1)
Sodium: 143 mmol/L (ref 135–145)

## 2023-08-12 LAB — HIV ANTIBODY (ROUTINE TESTING W REFLEX): HIV Screen 4th Generation wRfx: NONREACTIVE

## 2023-08-12 MED ORDER — NICOTINE 7 MG/24HR TD PT24
7.0000 mg | MEDICATED_PATCH | Freq: Every day | TRANSDERMAL | Status: DC
  Filled 2023-08-12: qty 1

## 2023-08-12 MED ORDER — IPRATROPIUM-ALBUTEROL 0.5-2.5 (3) MG/3ML IN SOLN: 3.0000 mL | RESPIRATORY_TRACT | Status: DC | PRN

## 2023-08-12 MED ORDER — PANTOPRAZOLE SODIUM 40 MG PO TBEC
40.0000 mg | DELAYED_RELEASE_TABLET | Freq: Every day | ORAL | Status: DC
Start: 2023-08-12 — End: 2023-08-13
  Administered 2023-08-12 – 2023-08-13 (×2): 40 mg via ORAL
  Filled 2023-08-12 (×2): qty 1

## 2023-08-12 MED ORDER — SODIUM CHLORIDE 0.9 % IV SOLN
1.0000 g | INTRAVENOUS | Status: DC
  Administered 2023-08-12 – 2023-08-13 (×2): 1 g via INTRAVENOUS
  Filled 2023-08-12 (×2): qty 10

## 2023-08-12 MED ORDER — NICOTINE POLACRILEX 2 MG MT GUM: 2.0000 mg | CHEWING_GUM | OROMUCOSAL | Status: DC | PRN

## 2023-08-12 NOTE — Progress Notes (Signed)
 Progress Note   Patient: Crystal Silva FMW:[id number] DOB: 11/15/1960 DOA: 08/11/2023     0 DOS: the patient was seen and examined on 08/12/2023   Brief hospital course: 63 year old female with a history of COPD, chronic respiratory failure on 3 L, aortic stenosis status post bioprosthetic valve replacement 05/2021, postoperative atrial fibrillation, tobacco abuse presenting with 1 week history of shortness of breath that significantly worsened over the last 24 hours.  She states that she lives with her grandson with whom has had some upper respiratory illness over the past week.  Unfortunately, she continues to smoke 1/2 pack/day.  She denies any fevers, chills, headache, neck pain, hemoptysis, nausea, vomiting, diarrhea.  She has some chest discomfort with coughing.  There is no dysuria or hematuria, hematochezia, melena.  She used her nebulizer at home without much relief.  EMS was activated.  The patient was noted to be 87% saturation on her usual oxygen .  She was initially placed on 15 L. In the ED, the patient was afebrile hemodynamically stable with oxygen  saturation 94% on 3 L.  WBC 9.2, hemoglobin 14.9, platelets 124.  Sodium 139, potassium 3.7, bicarbonate 31, serum creatinine 0.51.  Lactic acid 2.2.  EKG shows sinus rhythm with nonspecific T wave changes.  Chest x-ray showed increased interstitial opacities.  The patient was started on ceftriaxone , azithromycin , and Solu-Medrol .  She was given bronchodilators.  She was mated for further evaluation and treatment of her pneumonia and COPD exacerbation.  Assessment and Plan: Acute on chronic respiratory failure with hypoxia -Secondary to COPD exacerbation and pneumonia -Initially on 15 L now on 3 L as tolerated.  However patient still has significant dyspnea with ambulation to the bathroom and back. With ceftriaxone  and azithromycin  daily as ordered.  Status post Solu-Medrol  in the ER, currently on 40 mg IV twice daily.  Also on DVT prophylaxis.   I will ensure patient has GI prophylaxis as well.   COPD exacerbation -Start Brovana  -Start Pulmicort  -Start DuoNebs -Continue IV Solu-Medrol , insulin  sliding scale ordered. -Pretreat viral panel shows rhinovirus/enterovirus.  Discussed with patient  thromboCytopenia, platelets still over 100 K.  At this time would be attributable to acute infection.  Continue with monitoring.   Lactic acidosis .  At this time monitoring clinically.   Tobacco abuse -Tobacco cessation discussed by admiting doc.  - nicotine  replacement ordered.   Status post bioprosthetic aortic valve replacement -Follow-up with Dr. Kerrin   Essential hypertension -Continue metoprolol    Hyperglycemia -check A1C  Pharmcy input pending on home med rec     Subjective: : Patient is alert and awake, gives a coherent account of her symptoms.  Reports still having shortness of breath while walking to the bathroom and back.  However feeling better and that she was short of breath even at rest yesterday.  Physical Exam: Vitals:   08/11/23 1930 08/11/23 2145 08/12/23 0159 08/12/23 0432  BP:  129/76 118/85 139/89  Pulse:  87 84 72  Resp:  20 20 18   Temp:  98.7 F (37.1 C) 98 F (36.7 C) 98 F (36.7 C)  TempSrc:  Oral Oral Oral  SpO2: 95% 97% 97% 96%  Weight:      Height:       Respiratory: Bilateral diffusely diminished air entry with expiratory wheezes scant.  Patient on 3 to 4 L/min of supplementary oxygen .  No crackles are heard Cardiovascular exam S1-S2 normal Abdomen all quadrants are soft nontender Extremities warm without edema Data Reviewed:  Labs on Admission:  Results for orders placed or performed during the hospital encounter of 08/11/23 (from the past 24 hours)  CBC     Status: Abnormal   Collection Time: 08/11/23 12:35 PM  Result Value Ref Range   WBC 7.2 4.0 - 10.5 K/uL   RBC 4.71 3.87 - 5.11 MIL/uL   Hemoglobin 14.9 12.0 - 15.0 g/dL   HCT 53.1 (H) 63.9 - 53.9 %   MCV 99.4 80.0 -  100.0 fL   MCH 31.6 26.0 - 34.0 pg   MCHC 31.8 30.0 - 36.0 g/dL   RDW 86.4 88.4 - 84.4 %   Platelets 124 (L) 150 - 400 K/uL   nRBC 0.0 0.0 - 0.2 %  Basic metabolic panel     Status: Abnormal   Collection Time: 08/11/23 12:35 PM  Result Value Ref Range   Sodium 139 135 - 145 mmol/L   Potassium 3.7 3.5 - 5.1 mmol/L   Chloride 100 98 - 111 mmol/L   CO2 31 22 - 32 mmol/L   Glucose, Bld 203 (H) 70 - 99 mg/dL   BUN 22 8 - 23 mg/dL   Creatinine, Ser 9.48 0.44 - 1.00 mg/dL   Calcium 9.1 8.9 - 89.6 mg/dL   GFR, Estimated >39 >39 mL/min   Anion gap 8 5 - 15  Lactic acid, plasma     Status: Abnormal   Collection Time: 08/11/23 12:35 PM  Result Value Ref Range   Lactic Acid, Venous 2.2 (HH) 0.5 - 1.9 mmol/L  Culture, blood (routine x 2)     Status: None (Preliminary result)   Collection Time: 08/11/23 12:35 PM   Specimen: BLOOD  Result Value Ref Range   Specimen Description BLOOD BLOOD RIGHT ARM    Special Requests      BOTTLES DRAWN AEROBIC AND ANAEROBIC Blood Culture results may not be optimal due to an inadequate volume of blood received in culture bottles   Culture      NO GROWTH < 24 HOURS Performed at [facility], [address], [place], [place] [address]    Report Status PENDING   Hemoglobin A1c     Status: None   Collection Time: 08/11/23 12:35 PM  Result Value Ref Range   Hgb A1c MFr Bld 5.6 4.8 - 5.6 %   Mean Plasma Glucose 114 mg/dL  Procalcitonin     Status: None   Collection Time: 08/11/23 12:35 PM  Result Value Ref Range   Procalcitonin <0.10 ng/mL  Culture, blood (routine x 2)     Status: None (Preliminary result)   Collection Time: 08/11/23 12:37 PM   Specimen: BLOOD  Result Value Ref Range   Specimen Description BLOOD BLOOD LEFT ARM    Special Requests      BOTTLES DRAWN AEROBIC AND ANAEROBIC Blood Culture results may not be optimal due to an inadequate volume of blood received in culture bottles   Culture      NO GROWTH < 24 HOURS Performed at [facility], [address], [place], [place] [address]    Report Status PENDING   Respiratory (~20 pathogens) panel by PCR     Status: Abnormal   Collection Time: 08/11/23  2:42 PM   Specimen: Nasopharyngeal Swab; Respiratory  Result Value Ref Range   Adenovirus NOT DETECTED NOT DETECTED   Coronavirus 229E NOT DETECTED NOT DETECTED   Coronavirus HKU1 NOT DETECTED NOT DETECTED   Coronavirus NL63 NOT DETECTED NOT DETECTED   Coronavirus OC43 NOT DETECTED NOT DETECTED   Metapneumovirus  NOT DETECTED NOT DETECTED   Rhinovirus / Enterovirus DETECTED (A) NOT DETECTED   Influenza A NOT DETECTED NOT DETECTED   Influenza B NOT DETECTED NOT DETECTED   Parainfluenza Virus 1 NOT DETECTED NOT DETECTED   Parainfluenza Virus 2 NOT DETECTED NOT DETECTED   Parainfluenza Virus 3 NOT DETECTED NOT DETECTED   Parainfluenza Virus 4 NOT DETECTED NOT DETECTED   Respiratory Syncytial Virus NOT DETECTED NOT DETECTED   Bordetella pertussis NOT DETECTED NOT DETECTED   Bordetella Parapertussis NOT DETECTED NOT DETECTED   Chlamydophila pneumoniae NOT DETECTED NOT DETECTED   Mycoplasma pneumoniae NOT DETECTED NOT DETECTED  SARS Coronavirus 2 by RT PCR (hospital order, performed in [facility] hospital lab) *cepheid single result test* Anterior Nasal Swab     Status: None   Collection Time: 08/11/23  2:43 PM   Specimen: Anterior Nasal Swab  Result Value Ref Range   SARS Coronavirus 2 by RT PCR NEGATIVE NEGATIVE  Blood gas, venous     Status: Abnormal   Collection Time: 08/11/23  3:28 PM  Result Value Ref Range   pH, Ven 7.39 7.25 - 7.43   pCO2, Ven 52 44 - 60 mmHg   pO2, Ven 82 (H) 32 - 45 mmHg   Bicarbonate 31.5 (H) 20.0 - 28.0 mmol/L   Acid-Base Excess 5.1 (H) 0.0 - 2.0 mmol/L   O2 Saturation 97.6 %   Patient temperature 36.9    Collection site BLOOD RIGHT HAND    Drawn by [id number]   Glucose, capillary     Status: Abnormal   Collection Time: 08/11/23  4:03 PM  Result Value Ref Range   Glucose-Capillary 150 (H) 70 -  99 mg/dL   Comment 1 Notify RN    Comment 2 Document in Chart   Glucose, capillary     Status: Abnormal   Collection Time: 08/11/23  9:47 PM  Result Value Ref Range   Glucose-Capillary 180 (H) 70 - 99 mg/dL   Comment 1 Notify RN    Comment 2 Document in Chart   Urinalysis, w/ Reflex to Culture (Infection Suspected) -Urine, Clean Catch     Status: Abnormal   Collection Time: 08/12/23  1:45 AM  Result Value Ref Range   Specimen Source URINE, CLEAN CATCH    Color, Urine YELLOW YELLOW   APPearance HAZY (A) CLEAR   Specific Gravity, Urine 1.025 1.005 - 1.030   pH 5.0 5.0 - 8.0   Glucose, UA NEGATIVE NEGATIVE mg/dL   Hgb urine dipstick SMALL (A) NEGATIVE   Bilirubin Urine NEGATIVE NEGATIVE   Ketones, ur NEGATIVE NEGATIVE mg/dL   Protein, ur 30 (A) NEGATIVE mg/dL   Nitrite NEGATIVE NEGATIVE   Leukocytes,Ua SMALL (A) NEGATIVE   RBC / HPF 6-10 0 - 5 RBC/hpf   WBC, UA 21-50 0 - 5 WBC/hpf   Bacteria, UA RARE (A) NONE SEEN   Squamous Epithelial / HPF 0-5 0 - 5 /HPF   Mucus PRESENT    Ca Oxalate Crys, UA PRESENT   Basic metabolic panel     Status: Abnormal   Collection Time: 08/12/23  4:38 AM  Result Value Ref Range   Sodium 143 135 - 145 mmol/L   Potassium 4.1 3.5 - 5.1 mmol/L   Chloride 101 98 - 111 mmol/L   CO2 33 (H) 22 - 32 mmol/L   Glucose, Bld 140 (H) 70 - 99 mg/dL   BUN 21 8 - 23 mg/dL   Creatinine, Ser 9.63 (L) 0.44 - 1.00 mg/dL  Calcium 9.3 8.9 - 10.3 mg/dL   GFR, Estimated >39 >39 mL/min   Anion gap 9 5 - 15  CBC     Status: Abnormal   Collection Time: 08/12/23  4:38 AM  Result Value Ref Range   WBC 5.8 4.0 - 10.5 K/uL   RBC 4.30 3.87 - 5.11 MIL/uL   Hemoglobin 14.0 12.0 - 15.0 g/dL   HCT 57.2 63.9 - 53.9 %   MCV 99.3 80.0 - 100.0 fL   MCH 32.6 26.0 - 34.0 pg   MCHC 32.8 30.0 - 36.0 g/dL   RDW 86.3 88.4 - 84.4 %   Platelets 125 (L) 150 - 400 K/uL   nRBC 0.0 0.0 - 0.2 %  Glucose, capillary     Status: Abnormal   Collection Time: 08/12/23  7:37 AM  Result Value  Ref Range   Glucose-Capillary 115 (H) 70 - 99 mg/dL   Basic Metabolic Panel: Recent Labs  Lab 08/11/23 1235 08/12/23 0438  NA 139 143  K 3.7 4.1  CL 100 101  CO2 31 33*  GLUCOSE 203* 140*  BUN 22 21  CREATININE 0.51 0.36*  CALCIUM 9.1 9.3   Liver Function Tests: No results for input(s): AST, ALT, ALKPHOS, BILITOT, PROT, ALBUMIN  in the last 168 hours. No results for input(s): LIPASE, AMYLASE in the last 168 hours. No results for input(s): AMMONIA in the last 168 hours. CBC: Recent Labs  Lab 08/11/23 1235 08/12/23 0438  WBC 7.2 5.8  HGB 14.9 14.0  HCT 46.8* 42.7  MCV 99.4 99.3  PLT 124* 125*   Cardiac Enzymes: No results for input(s): CKTOTAL, CKMB, CKMBINDEX, TROPONINIHS in the last 168 hours.  BNP (last 3 results) No results for input(s): PROBNP in the last 8760 hours. CBG: Recent Labs  Lab 08/11/23 1603 08/11/23 2147 08/12/23 0737  GLUCAP 150* 180* 115*    Radiological Exams on Admission:  DG Chest Portable 1 View Result Date: 08/11/2023 CLINICAL DATA:  Shortness of breath EXAM: PORTABLE CHEST 1 VIEW COMPARISON:  Chest radiograph dated 06/12/2022 FINDINGS: Normal lung volumes. Diffuse bilateral interstitial opacities. Bibasilar patchy opacities. No pleural effusion or pneumothorax. The heart size and mediastinal contours are within normal limits. Median sternotomy wires are nondisplaced. IMPRESSION: Diffuse bilateral interstitial opacities and bibasilar patchy opacities, which may represent pulmonary edema or pneumonia. Electronically Signed   By: Limin  Xu M.D.   On: 08/11/2023 09:31       Family Communication: per patient.  Disposition: Status is: Observation The patient will require care spanning > 2 midnights and should be moved to inpatient because: still significant resp distress/exam findings  Planned Discharge Destination: Home    Time spent: 35 minutes  Author: Jacqulyn Divine, MD 08/12/2023 9:35 AM  For on call  review [email or web address].

## 2023-08-12 NOTE — Progress Notes (Signed)
 Mobility Specialist Progress Note:    08/12/23 1122  Mobility  Activity Ambulated with assistance in room;Stood at bedside  Level of Assistance Standby assist, set-up cues, supervision of patient - no hands on  Assistive Device None  Distance Ambulated (ft) 30 ft  Range of Motion/Exercises Active;All extremities  Activity Response Tolerated well  Mobility Referral Yes  Mobility visit 1 Mobility  Mobility Specialist Start Time (ACUTE ONLY) 1040  Mobility Specialist Stop Time (ACUTE ONLY) 1100  Mobility Specialist Time Calculation (min) (ACUTE ONLY) 20 min   Pt received in bed, agreeable to mobility. Required SBA to stand and ambulate with no AD. Tolerated well, audible SOB. SpO2 95% on 3L. Left pt sitting EOB, all needs met.   Sherrilee Ditty Mobility Specialist Please contact via Special Educational Needs Teacher or  Rehab office at [phone]

## 2023-08-12 NOTE — Progress Notes (Signed)
   08/12/23 1309  TOC Brief Assessment  Insurance and Status Reviewed  Patient has primary care physician Yes  Home environment has been reviewed Single Family Home  Prior level of function: Independent  Prior/Current Home Services No current home services  Social Drivers of Health Review SDOH reviewed no interventions necessary  Readmission risk has been reviewed Yes  Transition of care needs no transition of care needs at this time    Transition of Care Department [facility]) has reviewed patient and no TOC needs have been identified at this time. We will continue to monitor patient advancement through interdisciplinary progression rounds. If new patient transition needs arise, please place a TOC consult.

## 2023-08-13 DIAGNOSIS — J441 Chronic obstructive pulmonary disease with (acute) exacerbation: Secondary | ICD-10-CM | POA: Diagnosis not present

## 2023-08-13 LAB — BASIC METABOLIC PANEL
Anion gap: 8 (ref 5–15)
BUN: 30 mg/dL — ABNORMAL HIGH (ref 8–23)
CO2: 34 mmol/L — ABNORMAL HIGH (ref 22–32)
Calcium: 9.1 mg/dL (ref 8.9–10.3)
Chloride: 102 mmol/L (ref 98–111)
Creatinine, Ser: 0.46 mg/dL (ref 0.44–1.00)
GFR, Estimated: >60 mL/min (ref >60)
Glucose, Bld: 153 mg/dL — ABNORMAL HIGH (ref 70–99)
Potassium: 4.2 mmol/L (ref 3.5–5.1)
Sodium: 144 mmol/L (ref 135–145)

## 2023-08-13 LAB — CBC
HCT: 43.1 % (ref 36.0–46.0)
Hemoglobin: 13.7 g/dL (ref 12.0–15.0)
MCH: 31.8 pg (ref 26.0–34.0)
MCHC: 31.8 g/dL (ref 30.0–36.0)
MCV: 100 fL (ref 80.0–100.0)
Platelets: 136 10*3/uL — ABNORMAL LOW (ref 150–400)
RBC: 4.31 MIL/uL (ref 3.87–5.11)
RDW: 13.6 % (ref 11.5–15.5)
WBC: 5.8 10*3/uL (ref 4.0–10.5)
nRBC: 0 % (ref 0.0–0.2)

## 2023-08-13 LAB — URINE CULTURE

## 2023-08-13 LAB — GLUCOSE, CAPILLARY
Glucose-Capillary: 123 mg/dL — ABNORMAL HIGH (ref 70–99)
Glucose-Capillary: 116 mg/dL — ABNORMAL HIGH (ref 70–99)

## 2023-08-13 MED ORDER — ORAL CARE MOUTH RINSE: 15.0000 mL | OROMUCOSAL | Status: DC | PRN

## 2023-08-13 MED ORDER — METHYLPREDNISOLONE 4 MG PO TBPK: ORAL_TABLET | ORAL | 0 refills | Status: DC

## 2023-08-13 MED ORDER — METOPROLOL TARTRATE 25 MG PO TABS
25.0000 mg | ORAL_TABLET | Freq: Two times a day (BID) | ORAL | Status: DC
  Administered 2023-08-13: 25 mg via ORAL
  Filled 2023-08-13: qty 1

## 2023-08-13 MED ORDER — AZITHROMYCIN 250 MG PO TABS: ORAL_TABLET | ORAL | 0 refills | Status: DC

## 2023-08-13 NOTE — Plan of Care (Signed)

## 2023-08-13 NOTE — Discharge Summary (Signed)
 Physician Discharge Summary  Crystal Silva FMW:[id number] DOB: 1960/08/13 DOA: 08/11/2023  PCP: Shona Norleen PEDLAR, MD  Admit date: 08/11/2023 Discharge date: 08/13/2023  Admitted From: Home Disposition: Home  Recommendations for Outpatient Follow-up:  Follow up with PCP in 1-2 weeks Follow-up with pulmonology as scheduled:  Home Health: None Equipment/Devices: None  Discharge Condition: Stable CODE STATUS: Full Diet recommendation: Low-salt low-fat low-carb diet  Brief/Interim Summary: 63 year old female with a history of COPD, chronic respiratory failure on 3 L, aortic stenosis status post bioprosthetic valve replacement 05/2021, postoperative atrial fibrillation, tobacco abuse presenting with 1 week history of shortness of breath that significantly worsened over the last 24 hours   Patient admitted as above with acute respiratory failure hypoxia in setting of COPD exacerbation.  She has improved quite drastically over the past 48 hours, now back to baseline ambulating on routine supplemental oxygen  of 3 L without further symptoms.  She is requesting discharge home which is certainly reasonable, recommend close follow-up with PCP and to reestablish with pulmonology in the next 1 to 2 weeks if possible repeat testing and possible medication management.  Discharge on prior use 3 L nasal cannula   Discharge Diagnoses:  Principal Problem:   COPD exacerbation (HCC) Active Problems:   S/P aortic valve replacement   Essential hypertension   Acute on chronic respiratory failure with hypoxia [facility])    Discharge Instructions  Discharge Instructions     Ambulatory Referral for Lung Cancer Scre   Complete by: As directed    Call MD for:  difficulty breathing, headache or visual disturbances   Complete by: As directed    Call MD for:  extreme fatigue   Complete by: As directed    Call MD for:  persistant dizziness or light-headedness   Complete by: As directed    Diet - low sodium heart  healthy   Complete by: As directed    Increase activity slowly   Complete by: As directed       Allergies as of 08/13/2023   No Known Allergies      Medication List     STOP taking these medications    predniSONE  10 MG (21) Tbpk tablet Commonly known as: STERAPRED UNI-PAK 21 TAB       TAKE these medications    Airsupra 90-80 MCG/ACT Aero Generic drug: Albuterol -Budesonide  Inhale 1 puff into the lungs every 4 (four) hours as needed (wheezing, shortness of breath).   albuterol  108 (90 Base) MCG/ACT inhaler Commonly known as: VENTOLIN  HFA Inhale 2 puffs into the lungs every 6 (six) hours as needed for wheezing or shortness of breath.   aspirin  EC 325 MG tablet Take 1 tablet (325 mg total) by mouth daily.   azithromycin  250 MG tablet Commonly known as: Zithromax  Z-Pak Take 1 tablet PO daily until gone   ipratropium-albuterol  0.5-2.5 (3) MG/3ML Soln Commonly known as: DUONEB Take 3 mLs by nebulization every 4 (four) hours as needed (Shortness of breath/wheezing).   methylPREDNISolone  4 MG Tbpk tablet Commonly known as: MEDROL  DOSEPAK Taper as directed   metoprolol  tartrate 25 MG tablet Commonly known as: LOPRESSOR  Take 1 tablet (25 mg total) by mouth 2 (two) times daily.   multivitamin with minerals Tabs tablet Take 1 tablet by mouth daily.   naproxen sodium 220 MG tablet Commonly known as: ALEVE Take 220 mg by mouth daily as needed (headache).   sertraline  50 MG tablet Commonly known as: ZOLOFT  Take 50 mg by mouth daily.   Trelegy Ellipta 200-62.5-25  MCG/ACT Aepb Generic drug: Fluticasone -Umeclidin-Vilant Inhale 1 puff into the lungs daily.        No Known Allergies  Consultations: None  Procedures/Studies: DG Chest Portable 1 View Result Date: 08/11/2023 CLINICAL DATA:  Shortness of breath EXAM: PORTABLE CHEST 1 VIEW COMPARISON:  Chest radiograph dated 06/12/2022 FINDINGS: Normal lung volumes. Diffuse bilateral interstitial opacities.  Bibasilar patchy opacities. No pleural effusion or pneumothorax. The heart size and mediastinal contours are within normal limits. Median sternotomy wires are nondisplaced. IMPRESSION: Diffuse bilateral interstitial opacities and bibasilar patchy opacities, which may represent pulmonary edema or pneumonia. Electronically Signed   By: Limin  Xu M.D.   On: 08/11/2023 09:31     Subjective: No acute issues or events overnight   Discharge Exam: Vitals:   08/13/23 0724 08/13/23 0916  BP:    Pulse:    Resp:    Temp:    SpO2: 97% 96%   Vitals:   08/13/23 0722 08/13/23 0723 08/13/23 0724 08/13/23 0916  BP:      Pulse:      Resp:      Temp:      TempSrc:      SpO2: 97% 97% 97% 96%  Weight:      Height:        General: Pt is alert, awake, not in acute distress Cardiovascular: RRR, S1/S2 +, no rubs, no gallops Respiratory: CTA bilaterally, no wheezing, no rhonchi Abdominal: Soft, NT, ND, bowel sounds + Extremities: no edema, no cyanosis    The results of significant diagnostics from this hospitalization (including imaging, microbiology, ancillary and laboratory) are listed below for reference.     Microbiology: Recent Results (from the past 240 hours)  Culture, blood (routine x 2)     Status: None (Preliminary result)   Collection Time: 08/11/23 12:35 PM   Specimen: BLOOD  Result Value Ref Range Status   Specimen Description BLOOD BLOOD RIGHT ARM  Final   Special Requests   Final    BOTTLES DRAWN AEROBIC AND ANAEROBIC Blood Culture results may not be optimal due to an inadequate volume of blood received in culture bottles   Culture   Final    NO GROWTH 2 DAYS Performed at [facility], [address] [place], [place] [address]    Report Status PENDING  Incomplete  Culture, blood (routine x 2)     Status: None (Preliminary result)   Collection Time: 08/11/23 12:37 PM   Specimen: BLOOD  Result Value Ref Range Status   Specimen Description BLOOD BLOOD LEFT ARM  Final    Special Requests   Final    BOTTLES DRAWN AEROBIC AND ANAEROBIC Blood Culture results may not be optimal due to an inadequate volume of blood received in culture bottles   Culture   Final    NO GROWTH 2 DAYS Performed at [facility], [address], [place], [place] [address]    Report Status PENDING  Incomplete  Respiratory (~20 pathogens) panel by PCR     Status: Abnormal   Collection Time: 08/11/23  2:42 PM   Specimen: Nasopharyngeal Swab; Respiratory  Result Value Ref Range Status   Adenovirus NOT DETECTED NOT DETECTED Final   Coronavirus 229E NOT DETECTED NOT DETECTED Final    Comment: (NOTE) The Coronavirus on the Respiratory Panel, DOES NOT test for the novel  Coronavirus (2019 nCoV)    Coronavirus HKU1 NOT DETECTED NOT DETECTED Final   Coronavirus NL63 NOT DETECTED NOT DETECTED Final   Coronavirus OC43 NOT DETECTED NOT  DETECTED Final   Metapneumovirus NOT DETECTED NOT DETECTED Final   Rhinovirus / Enterovirus DETECTED (A) NOT DETECTED Final   Influenza A NOT DETECTED NOT DETECTED Final   Influenza B NOT DETECTED NOT DETECTED Final   Parainfluenza Virus 1 NOT DETECTED NOT DETECTED Final   Parainfluenza Virus 2 NOT DETECTED NOT DETECTED Final   Parainfluenza Virus 3 NOT DETECTED NOT DETECTED Final   Parainfluenza Virus 4 NOT DETECTED NOT DETECTED Final   Respiratory Syncytial Virus NOT DETECTED NOT DETECTED Final   Bordetella pertussis NOT DETECTED NOT DETECTED Final   Bordetella Parapertussis NOT DETECTED NOT DETECTED Final   Chlamydophila pneumoniae NOT DETECTED NOT DETECTED Final   Mycoplasma pneumoniae NOT DETECTED NOT DETECTED Final    Comment: Performed at [facility], [address] [place], [place] [address]  SARS Coronavirus 2 by RT PCR (hospital order, performed in [facility] hospital lab) *cepheid single result test* Anterior Nasal Swab     Status: None   Collection Time: 08/11/23  2:43 PM   Specimen: Anterior Nasal Swab  Result Value Ref Range Status    SARS Coronavirus 2 by RT PCR NEGATIVE NEGATIVE Final    Comment: (NOTE) SARS-CoV-2 target nucleic acids are NOT DETECTED.  The SARS-CoV-2 RNA is generally detectable in upper and lower respiratory specimens during the acute phase of infection. The lowest concentration of SARS-CoV-2 viral copies this assay can detect is 250 copies / mL. A negative result does not preclude SARS-CoV-2 infection and should not be used as the sole basis for treatment or other patient management decisions.  A negative result may occur with improper specimen collection / handling, submission of specimen other than nasopharyngeal swab, presence of viral mutation(s) within the areas targeted by this assay, and inadequate number of viral copies (<250 copies / mL). A negative result must be combined with clinical observations, patient history, and epidemiological information.  Fact Sheet for Patients:   roadlaptop.co.za  Fact Sheet for Healthcare Providers: http://kim-miller.com/  This test is not yet approved or  cleared by the [place]  FDA and has been authorized for detection and/or diagnosis of SARS-CoV-2 by FDA under an Emergency Use Authorization (EUA).  This EUA will remain in effect (meaning this test can be used) for the duration of the COVID-19 declaration under Section 564(b)(1) of the Act, 21 U.S.C. section 360bbb-3(b)(1), unless the authorization is terminated or revoked sooner.  Performed at [facility], [address], [place], [place] [address]   Urine Culture     Status: None (Preliminary result)   Collection Time: 08/12/23  1:45 AM   Specimen: Urine, Clean Catch  Result Value Ref Range Status   Specimen Description   Final    URINE, CLEAN CATCH Performed at [facility], [address] [place], [place] [address]    Special Requests   Final    NONE Reflexed from [id number] Performed at [facility], [address], [place], [place]  [address]    Culture PENDING  Incomplete   Report Status PENDING  Incomplete     Labs: BNP (last 3 results) No results for input(s): BNP in the last 8760 hours. Basic Metabolic Panel: Recent Labs  Lab 08/11/23 1235 08/12/23 0438 08/13/23 0455  NA 139 143 144  K 3.7 4.1 4.2  CL 100 101 102  CO2 31 33* 34*  GLUCOSE 203* 140* 153*  BUN 22 21 30*  CREATININE 0.51 0.36* 0.46  CALCIUM 9.1 9.3 9.1   Liver Function Tests:  No results for input(s): AST, ALT, ALKPHOS, BILITOT, PROT, ALBUMIN  in the last 168 hours. No results for input(s): LIPASE, AMYLASE in the last 168 hours. No results for input(s): AMMONIA in the last 168 hours. CBC: Recent Labs  Lab 08/11/23 1235 08/12/23 0438 08/13/23 0455  WBC 7.2 5.8 5.8  HGB 14.9 14.0 13.7  HCT 46.8* 42.7 43.1  MCV 99.4 99.3 100.0  PLT 124* 125* 136*   Cardiac Enzymes: No results for input(s): CKTOTAL, CKMB, CKMBINDEX, TROPONINI in the last 168 hours. BNP: Invalid input(s): POCBNP CBG: Recent Labs  Lab 08/12/23 1129 08/12/23 1620 08/12/23 2048 08/13/23 0729 08/13/23 1104  GLUCAP 162* 132* 145* 123* 116*   D-Dimer No results for input(s): DDIMER in the last 72 hours. Hgb A1c Recent Labs    08/11/23 1235  HGBA1C 5.6   Lipid Profile No results for input(s): CHOL, HDL, LDLCALC, TRIG, CHOLHDL, LDLDIRECT in the last 72 hours. Thyroid function studies No results for input(s): TSH, T4TOTAL, T3FREE, THYROIDAB in the last 72 hours.  Invalid input(s): FREET3 Anemia work up No results for input(s): VITAMINB12, FOLATE, FERRITIN, TIBC, IRON, RETICCTPCT in the last 72 hours. Urinalysis    Component Value Date/Time   COLORURINE YELLOW 08/12/2023 0145   APPEARANCEUR HAZY (A) 08/12/2023 0145   LABSPEC 1.025 08/12/2023 0145   PHURINE 5.0 08/12/2023 0145   GLUCOSEU NEGATIVE 08/12/2023 0145   HGBUR SMALL (A) 08/12/2023 0145   BILIRUBINUR NEGATIVE 08/12/2023 0145    KETONESUR NEGATIVE 08/12/2023 0145   PROTEINUR 30 (A) 08/12/2023 0145   UROBILINOGEN 0.2 05/02/2009 1500   NITRITE NEGATIVE 08/12/2023 0145   LEUKOCYTESUR SMALL (A) 08/12/2023 0145   Sepsis Labs Recent Labs  Lab 08/11/23 1235 08/12/23 0438 08/13/23 0455  WBC 7.2 5.8 5.8   Microbiology Recent Results (from the past 240 hours)  Culture, blood (routine x 2)     Status: None (Preliminary result)   Collection Time: 08/11/23 12:35 PM   Specimen: BLOOD  Result Value Ref Range Status   Specimen Description BLOOD BLOOD RIGHT ARM  Final   Special Requests   Final    BOTTLES DRAWN AEROBIC AND ANAEROBIC Blood Culture results may not be optimal due to an inadequate volume of blood received in culture bottles   Culture   Final    NO GROWTH 2 DAYS Performed at [facility], [address] [place], [place] [address]    Report Status PENDING  Incomplete  Culture, blood (routine x 2)     Status: None (Preliminary result)   Collection Time: 08/11/23 12:37 PM   Specimen: BLOOD  Result Value Ref Range Status   Specimen Description BLOOD BLOOD LEFT ARM  Final   Special Requests   Final    BOTTLES DRAWN AEROBIC AND ANAEROBIC Blood Culture results may not be optimal due to an inadequate volume of blood received in culture bottles   Culture   Final    NO GROWTH 2 DAYS Performed at [facility], [address] [place], [place] [address]    Report Status PENDING  Incomplete  Respiratory (~20 pathogens) panel by PCR     Status: Abnormal   Collection Time: 08/11/23  2:42 PM   Specimen: Nasopharyngeal Swab; Respiratory  Result Value Ref Range Status   Adenovirus NOT DETECTED NOT DETECTED Final   Coronavirus 229E NOT DETECTED NOT DETECTED Final    Comment: (NOTE) The Coronavirus on the Respiratory Panel, DOES NOT test for the novel  Coronavirus (2019 nCoV)    Coronavirus HKU1 NOT DETECTED  NOT DETECTED Final   Coronavirus NL63 NOT DETECTED NOT DETECTED Final   Coronavirus OC43 NOT DETECTED NOT  DETECTED Final   Metapneumovirus NOT DETECTED NOT DETECTED Final   Rhinovirus / Enterovirus DETECTED (A) NOT DETECTED Final   Influenza A NOT DETECTED NOT DETECTED Final   Influenza B NOT DETECTED NOT DETECTED Final   Parainfluenza Virus 1 NOT DETECTED NOT DETECTED Final   Parainfluenza Virus 2 NOT DETECTED NOT DETECTED Final   Parainfluenza Virus 3 NOT DETECTED NOT DETECTED Final   Parainfluenza Virus 4 NOT DETECTED NOT DETECTED Final   Respiratory Syncytial Virus NOT DETECTED NOT DETECTED Final   Bordetella pertussis NOT DETECTED NOT DETECTED Final   Bordetella Parapertussis NOT DETECTED NOT DETECTED Final   Chlamydophila pneumoniae NOT DETECTED NOT DETECTED Final   Mycoplasma pneumoniae NOT DETECTED NOT DETECTED Final    Comment: Performed at [facility], [address], [place], [place] [address]  SARS Coronavirus 2 by RT PCR (hospital order, performed in [facility] hospital lab) *cepheid single result test* Anterior Nasal Swab     Status: None   Collection Time: 08/11/23  2:43 PM   Specimen: Anterior Nasal Swab  Result Value Ref Range Status   SARS Coronavirus 2 by RT PCR NEGATIVE NEGATIVE Final    Comment: (NOTE) SARS-CoV-2 target nucleic acids are NOT DETECTED.  The SARS-CoV-2 RNA is generally detectable in upper and lower respiratory specimens during the acute phase of infection. The lowest concentration of SARS-CoV-2 viral copies this assay can detect is 250 copies / mL. A negative result does not preclude SARS-CoV-2 infection and should not be used as the sole basis for treatment or other patient management decisions.  A negative result may occur with improper specimen collection / handling, submission of specimen other than nasopharyngeal swab, presence of viral mutation(s) within the areas targeted by this assay, and inadequate number of viral copies (<250 copies / mL). A negative result must be combined with clinical observations, patient history, and  epidemiological information.  Fact Sheet for Patients:   roadlaptop.co.za  Fact Sheet for Healthcare Providers: http://kim-miller.com/  This test is not yet approved or  cleared by the [place]  FDA and has been authorized for detection and/or diagnosis of SARS-CoV-2 by FDA under an Emergency Use Authorization (EUA).  This EUA will remain in effect (meaning this test can be used) for the duration of the COVID-19 declaration under Section 564(b)(1) of the Act, 21 U.S.C. section 360bbb-3(b)(1), unless the authorization is terminated or revoked sooner.  Performed at [facility], [address], [place], [place] [address]   Urine Culture     Status: None (Preliminary result)   Collection Time: 08/12/23  1:45 AM   Specimen: Urine, Clean Catch  Result Value Ref Range Status   Specimen Description   Final    URINE, CLEAN CATCH Performed at [facility], [address], [place], [place] [address]    Special Requests   Final    NONE Reflexed from [phone] Performed at [facility], [address], [place], [place] [address]    Culture PENDING  Incomplete   Report Status PENDING  Incomplete     Time coordinating discharge: Over 30 minutes  SIGNED:   Elsie JAYSON Montclair, DO [facility] 08/13/2023, 11:38 AM Pager   If 7PM-7AM, please contact night-coverage [email or web address]

## 2023-08-13 NOTE — Plan of Care (Signed)
   Problem: Education: Goal: Knowledge of General Education information will improve Description Including pain rating scale, medication(s)/side effects and non-pharmacologic comfort measures Outcome: Progressing   Problem: Health Behavior/Discharge Planning: Goal: Ability to manage health-related needs will improve Outcome: Progressing

## 2023-08-14 ENCOUNTER — Telehealth: Payer: Self-pay

## 2023-08-14 NOTE — Patient Instructions (Signed)
 Visit Information  Thank you for taking time to visit with me today. Please don't hesitate to contact me if I can be of assistance to you before our next scheduled telephone appointment.  Our next appointment is by telephone on 08/20/23 at 11:00am  Following is a copy of your care plan:   Goals Addressed             This Visit's Progress    TOC Care Plan       Current Barriers:  Chronic Disease Management support and education needs related to COPD   RNCM Clinical Goal(s):  Patient will take all medications exactly as prescribed and will call provider for medication related questions as evidenced by no missed medication doses  attend all scheduled medical appointments: with PCP and Pulmonologist  as evidenced by no missed appointment  continue to work with RN Care Manager to address care management and care coordination needs related to  COPD as evidenced by adherence to CM Team Scheduled appointments through collaboration with RN Care manager, provider, and care team.   Interventions: Evaluation of current treatment plan related to  self management and patient's adherence to plan as established by provider  Transitions of Care:  New goal. Doctor Visits  - discussed the importance of doctor visits Post discharge activity limitations prescribed by provider reviewed Reviewed Signs and symptoms of infection  COPD Interventions:  (Status:  New goal.) Short Term Goal Advised patient to track and manage COPD triggers Provided instruction about proper use of medications used for management of COPD including inhalers Advised patient to self assesses COPD action plan zone and make appointment with provider if in the yellow zone for 48 hours without improvement Advised patient to engage in light exercise as tolerated 3-5 days a week to aid in the the management of COPD Provided education about and advised patient to utilize infection prevention strategies to reduce risk of respiratory  infection Discussed the importance of adequate rest and management of fatigue with COPD Discussed Pulmonary Rehab and offered to assist with referral placement Assessed social determinant of health barriers  Patient Goals/Self-Care Activities: Participate in Transition of Care Program/Attend TOC scheduled calls Take all medications as prescribed Attend all scheduled provider appointments Call pharmacy for medication refills 3-7 days in advance of running out of medications Perform all self care activities independently  Perform IADL's (shopping, preparing meals, housekeeping, managing finances) independently Call provider office for new concerns or questions  identify and avoid work-related triggers identify and remove indoor air pollutants limit outdoor activity during cold weather do breathing exercises every day attend pulmonary rehabilitation eliminate symptom triggers at home keep follow-up appointments: with PCP and Pulmonology use an extra pillow to sleep eat healthy/prescribed diet: Heart Healthy, Low Fat Carb Modified  use devices that will help like a cane, sock-puller or reacher practice relaxation or meditation daily do breathing exercises every day do exercises in a comfortable position that makes breathing as easy as possible  Follow Up Plan:   Follow-up   call 08/20/23 @ 11:00am         Reviewed goals for care Patient/ Caregiver  verbalizes understanding of instructions and care plan provided. Patient / Caregiver was encouraged to make informed decisions about care, actively participate in managing health conditions, and implement lifestyle changes as needed to promote independence and self-management of their medical care   [facility] Case Management Nurse will provide follow-up and on-going assessment ,evaluation and education of disease processes, recommended interventions for both chronic and  acute medical conditions ,  along with ongoing review of symptoms ,medication  reviews / reconciliation during each weekly call . Any updates , inconsistencies, discrepancies or acute care concerns will be addressed and routed to the correct Practitioner if indicated    Please call the care guide team at [phone]  if you need to cancel or reschedule your appointment . For scheduled calls -Three attempts will be made to reach you if the scheduled call is missed or  we are unable to reach the you  after 3 attempts the case will be closed and no additional outreach attempts will be made.   If you need to speak to a Nurse you may  call me directly at the number below or if I am unavailable,and  your need is urgent  please call the main [facility] number at [phone] and ask to speak with one of the [facility] ( Transition of Care )  Nurses  .     Additionally, If you experience worsening of your symptoms, develop shortness of breath, If you are experiencing a medical emergency,  develop suicidal or homicidal thoughts you must seek medical attention immediately by calling 911 or report to your local emergency department or urgent care.   If you have a non-emergency medical problem during routine business hours, please contact your provider's office and ask to speak with a nurse.       Please take the time to read instructions/literature along with the possible adverse reactions/side effects for all the Medicines that have been prescribed to you. Only take newly prescribed  Medications after you have completely understood and accept all the possible adverse reactions/side effects.   Do not take more than prescribed Medications for  Pain, Sleep and Anxiety. Do not drive when taking Pain medications or sleep aid/ insomnia  medications It is not advisable to combine anxiety, sleep and pain medications without talking with your primary care practitioner    If you are experiencing a Mental Health or Behavioral Health Crisis or need someone to talk to Please call the Suicide and Crisis  Lifeline: 15 You may also call the [place]  National Suicide Prevention Lifeline: (520) 465-0140 or TTY: (463)767-7739 TTY 308-237-7474) to talk to a trained counselor.  You may call the Behavioral Health Crisis Line at (503) 101-4329, at any time, 24 hours a day, 7 days a week- however If you are in danger or need immediate medical attention, call 911.   If you would like help to quit smoking, call 1-800-QUIT-NOW ( 9135451499) OR Espaol: 1-855-Djelo-Ya (8-144-664-6430) o para ms informacin haga clic aqu or Text READY to 799-599 to register via text.   Bari Mayans , BSN, RN Care Management Coordinator [place]   [facility] [email or web address] Direct Dial: [phone]

## 2023-08-14 NOTE — Transitions of Care (Post Inpatient/ED Visit) (Signed)
 08/14/2023  Name: Crystal Silva MRN: [id number] DOB: [birth year]  Today's TOC FU Call Status: Today's TOC FU Call Status:: Successful TOC FU Call Completed TOC FU Call Complete Date: 08/14/23 Patient's Name and Date of Birth confirmed.  Transition Care Management Follow-up Telephone Call Date of Discharge: 08/13/23 [facility] Type of Discharge: Inpatient Admission Primary Inpatient Discharge Diagnosis:: COPD exacerbation How have you been since you were released from the hospital?: Better Any questions or concerns?: Yes Patient Questions/Concerns:: was not able ro get meds on dic=scharge Has not started ABT or Medrol , is picking up  today Patient Questions/Concerns Addressed: Other: (reviewe medsand anwered questions Missed 1 dose ABT will start today)  Items Reviewed: Did you receive and understand the discharge instructions provided?: Yes Medications obtained,verified, and reconciled?: Yes (Medications Reviewed) (Medication reconciliation / review completed based on most recent discharge summary and EHR medication list. Patient denies questions at this time  and reports no barriers to medication adherence once she picks up medications today) Any new allergies since your discharge?: No Dietary orders reviewed?: Yes Type of Diet Ordered:: Reg Heart Healthy, Low fat,Carb Modified Do you have support at home?: Yes People in Home: grandchild(ren) Name of Support/Comfort Primary Source: Grandchild lives with her, has friends , family and Neighbors  Medications Reviewed Today: Medications Reviewed Today     Reviewed by Willma Camelia LITTIE, RN (Registered Nurse) on 08/14/23 at 1009  Med List Status: <None>   Medication Order Taking? Sig Documenting Provider Last Dose Status Informant  albuterol  (VENTOLIN  HFA) 108 (90 Base) MCG/ACT inhaler [id number] Yes Inhale 2 puffs into the lungs every 6 (six) hours as needed for wheezing or shortness of breath. Raenelle Coria, MD  Taking Active Self  Albuterol -Budesonide  (AIRSUPRA) 90-80 MCG/ACT AERO [id number] Yes Inhale 1 puff into the lungs every 4 (four) hours as needed (wheezing, shortness of breath). [provider] Taking Active Self  aspirin  EC 325 MG EC tablet [id number] Yes Take 1 tablet (325 mg total) by mouth daily. Gold, Wayne E, PA-C Taking Active Self  azithromycin  (ZITHROMAX  Z-PAK) 250 MG tablet [id number] Yes Take 1 tablet PO daily until gone Lue Elsie BROCKS, MD Taking Active            Med Note (Lilya Smitherman L   Fri Aug 14, 2023 10:08 AM) Will pick up today  ipratropium-albuterol  (DUONEB) 0.5-2.5 (3) MG/3ML SOLN [id number] Yes Take 3 mLs by nebulization every 4 (four) hours as needed (Shortness of breath/wheezing). Cheryle Page, MD Taking Active Self  methylPREDNISolone  (MEDROL  DOSEPAK) 4 MG TBPK tablet [id number] Yes Taper as directed Lue Elsie BROCKS, MD Taking Active            Med Note DARRICK, Danyale Ridinger L   Fri Aug 14, 2023 10:08 AM) Will pick up today  metoprolol  tartrate (LOPRESSOR ) 25 MG tablet [id number]  Take 1 tablet (25 mg total) by mouth 2 (two) times daily. Richarda Prentice LITTIE Mickey., NP  Expired 08/12/23 2359 Self           Med Note (Henrick Mcgue L   Fri Aug 14, 2023 10:09 AM) She has some will get renewed   Multiple Vitamin (MULTIVITAMIN WITH MINERALS) TABS tablet [id number] Yes Take 1 tablet by mouth daily. [provider] Taking Active Self  naproxen sodium (ALEVE) 220 MG tablet [id number] Yes Take 220 mg by mouth daily as needed (headache). [provider] Taking Active Self  sertraline  (ZOLOFT ) 50 MG tablet [id number] Yes Take 50 mg by mouth daily.  [provider] Taking Active Self  DOMINIC BECK 200-62.5-25 MCG/ACT AEPB [id number] Yes Inhale 1 puff into the lungs daily. [provider] Taking Active Self          Medication reconciliation / review completed based on most recent discharge summary and EHR medication list. Confirmed patient is  taking all newly prescribed medications as instructed and is aware of any changes to and / or dosage adjustments to medication regimen. Patient denies questions at this time  and reports no barriers to medication adherence.   Home Care and Equipment/Supplies: Were Home Health Services Ordered?: No Any new equipment or medical supplies ordered?: No  She is inquiring about a portable concentrator and will discuss with Dr Shona and Pulmonology during follow-up appointments . Uses [employer] for home Oxygen  supplies   Functional Questionnaire: Do you need assistance with bathing/showering or dressing?: No Do you need assistance with meal preparation?: No Do you need assistance with eating?: No Do you have difficulty maintaining continence: No Do you need assistance with getting out of bed/getting out of a chair/moving?: No Do you have difficulty managing or taking your medications?: No  Follow up appointments reviewed: PCP Follow-up appointment confirmed?: No (She will call and schedule) MD Provider Line Number:[phone] Given: No Specialist Hospital Follow-up appointment confirmed?: No Reason Specialist Follow-Up Not Confirmed: Patient has Specialist Provider Number and will Call for Appointment (Ambulatory Referral  [facility]  [phone]  [address]  [place] [phone]) Do you need transportation to your follow-up appointment?: No (She drives) Do you understand care options if your condition(s) worsen?: Yes-patient verbalized understanding  SDOH Interventions Today    Flowsheet Row Most Recent Value  SDOH Interventions   Food Insecurity Interventions Intervention Not Indicated  Housing Interventions Intervention Not Indicated  Transportation Interventions Intervention Not Indicated, Patient Resources (Friends/Family)  Utilities Interventions Intervention Not Indicated  Social Connections Interventions Intervention Not Indicated        Goals Addressed             This Visit's Progress    TOC Care Plan       Current Barriers:  Chronic Disease Management support and education needs related to COPD   RNCM Clinical Goal(s):  Patient will take all medications exactly as prescribed and will call provider for medication related questions as evidenced by no missed medication doses  attend all scheduled medical appointments: with PCP and Pulmonologist  as evidenced by no missed appointment  continue to work with RN Care Manager to address care management and care coordination needs related to  COPD as evidenced by adherence to CM Team Scheduled appointments through collaboration with RN Care manager, provider, and care team.   Interventions: Evaluation of current treatment plan related to  self management and patient's adherence to plan as established by provider  Transitions of Care:  New goal. Doctor Visits  - discussed the importance of doctor visits Post discharge activity limitations prescribed by provider reviewed Reviewed Signs and symptoms of infection  COPD Interventions:  (Status:  New goal.) Short Term Goal Advised patient to track and manage COPD triggers Provided instruction about proper use of medications used for management of COPD including inhalers Advised patient to self assesses COPD action plan zone and make appointment with provider if in the yellow zone for 48 hours without improvement Advised patient to engage in light exercise as tolerated 3-5 days a week to aid in the the management of COPD  Provided education about and advised patient to utilize infection prevention strategies to reduce risk of respiratory infection Discussed the importance of adequate rest and management of fatigue with COPD Discussed Pulmonary Rehab and offered to assist with referral placement Assessed social determinant of health barriers  Patient Goals/Self-Care Activities: Participate in Transition of Care  Program/Attend TOC scheduled calls Take all medications as prescribed Attend all scheduled provider appointments Call pharmacy for medication refills 3-7 days in advance of running out of medications Perform all self care activities independently  Perform IADL's (shopping, preparing meals, housekeeping, managing finances) independently Call provider office for new concerns or questions  identify and avoid work-related triggers identify and remove indoor air pollutants limit outdoor activity during cold weather do breathing exercises every day attend pulmonary rehabilitation eliminate symptom triggers at home keep follow-up appointments: with PCP and Pulmonology use an extra pillow to sleep eat healthy/prescribed diet: Heart Healthy, Low Fat Carb Modified  use devices that will help like a cane, sock-puller or reacher practice relaxation or meditation daily do breathing exercises every day do exercises in a comfortable position that makes breathing as easy as possible  Follow Up Plan:   Follow-up   call 08/20/23 @ 11:00am        Patient is at high risk for readmission and/or has history of  high utilization  Discussed [facility]  TOC program and weekly calls to patient to assess condition/status, medication management  and provide support/education as indicated . Patient/ Caregiver voiced understanding and is  agreeable to 30 day program    Routine follow-up and on-going assessment evaluation and education of disease processes, recommended interventions for both chronic and acute medical conditions , will occur during each weekly visit along with ongoing review of symptoms ,medication reviews and reconciliation. Any updates , inconsistencies, discrepancies or acute care concerns will be addressed and routed to the correct Practitioner if indicated   Based on current information and Insurance plan -Reviewed benefits available to patient, including details about eligibility options for care if any  area of needs were identified.  Reviewed patients ability to access and / or navigating the benefits system..Amb Referral made if indicted , refer to orders section of note for details   Please refer to Care Plan for goals and interventions -Effectiveness of interventions, symptom management and outcomes will be evaluated  weekly during [facility] 30-day Program Outreach calls  . Any necessary  changes and updates to Care Plan will be completed episodically    Reviewed goals for care Patient verbalizes understanding of instructions and care plan provided. Patient was encouraged to make informed decisions about their care, actively participate in managing their health condition, and implement lifestyle changes as needed to promote independence and self-management of health care    The patient has been provided with contact information for the care management team and has been advised to call with any health-related questions or concerns.     Bari Mayans , BSN, RN Care Management Coordinator [place]   [facility] [email or web address] Direct Dial: [phone]

## 2023-08-16 LAB — CULTURE, BLOOD (ROUTINE X 2)
Culture: NO GROWTH
Culture: NO GROWTH

## 2023-08-18 DIAGNOSIS — I10 Essential (primary) hypertension: Secondary | ICD-10-CM | POA: Diagnosis not present

## 2023-08-18 DIAGNOSIS — J9601 Acute respiratory failure with hypoxia: Secondary | ICD-10-CM | POA: Diagnosis not present

## 2023-08-20 ENCOUNTER — Other Ambulatory Visit: Payer: Self-pay

## 2023-08-20 DIAGNOSIS — J42 Unspecified chronic bronchitis: Secondary | ICD-10-CM

## 2023-08-20 DIAGNOSIS — J441 Chronic obstructive pulmonary disease with (acute) exacerbation: Secondary | ICD-10-CM

## 2023-08-20 NOTE — Patient Instructions (Signed)
 Visit Information  Thank you for taking time to visit with me today. Please don't hesitate to contact me if I can be of assistance to you before our next scheduled telephone appointment.  Our next appointment is by telephone on 08/27/23 at 2:30pm  Following is a copy of your care plan:   Goals Addressed             This Visit's Progress    TOC Care Plan       Current Barriers:  Chronic Disease Management support and education needs related to COPD   RNCM Clinical Goal(s):  Patient will take all medications exactly as prescribed and will call provider for medication related questions as evidenced by no missed medication doses  attend all scheduled medical appointments: with PCP (08/21/23)and Pulmonologist  ( 08/25/23) as evidenced by no missed appointment  continue to work with RN Care Manager to address care management and care coordination needs related to  COPD as evidenced by adherence to CM Team Scheduled appointments through collaboration with RN Care manager, provider, and care team.   Interventions: Evaluation of current treatment plan related to  self management and patient's adherence to plan as established by provider  Transitions of Care:  New goal. Doctor Visits  - discussed the importance of doctor visits Post discharge activity limitations prescribed by provider reviewed Reviewed Signs and symptoms of infection  COPD Interventions:  (Status:  Goal on track:  Yes.) Short Term Goal Advised patient to track and manage COPD triggers Provided instruction about proper use of medications used for management of COPD including inhalers Advised patient to self assesses COPD action plan zone and make appointment with provider if in the yellow zone for 48 hours without improvement Advised patient to engage in light exercise as tolerated 3-5 days a week to aid in the the management of COPD Provided education about and advised patient to utilize infection prevention strategies to  reduce risk of respiratory infection Discussed the importance of adequate rest and management of fatigue with COPD Discussed Pulmonary Rehab and offered to assist with referral placement Assessed social determinant of health barriers  Patient Goals/Self-Care Activities: Participate in Transition of Care Program/Attend TOC scheduled calls Take all medications as prescribed Attend all scheduled provider appointments Call pharmacy for medication refills 3-7 days in advance of running out of medications Perform all self care activities independently  Perform IADL's (shopping, preparing meals, housekeeping, managing finances) independently Call provider office for new concerns or questions  identify and avoid work-related triggers identify and remove indoor air pollutants limit outdoor activity during cold weather do breathing exercises every day attend pulmonary rehabilitation eliminate symptom triggers at home keep follow-up appointments: with PCP and Pulmonology use an extra pillow to sleep eat healthy/prescribed diet: Heart Healthy, Low Fat Carb Modified  use devices that will help like a cane, sock-puller or reacher practice relaxation or meditation daily do breathing exercises every day do exercises in a comfortable position that makes breathing as easy as possible  Follow Up Plan:   Follow-up   call 08/27/23 @ 2:30pm         Reviewed goals for care Patient/ Caregiver verbalizes understanding of instructions with the plan of care . The  Patient / Caregiver was encouraged to make informed decisions about care, actively participate in managing health conditions, and implement lifestyle changes as needed to promote independence and self-management of healthcare. SDOH screenings have been completed and addressed if indicted There are no reported barriers to care.  Follow-up Plan [facility]I Case Management Nurse will provide follow-up and on-going assessment ,evaluation and education of  disease processes, recommended interventions for both chronic and acute medical conditions ,  along with ongoing review of symptoms ,medication reviews / reconciliation during each weekly call . Any updates , inconsistencies, discrepancies or acute care concerns will be addressed and routed to the correct Practitioner if indicated   [facility]  Please call the care guide team at [phone]  if you need to cancel or reschedule your appointment . For scheduled calls -Three attempts will be made to reach you -if the scheduled call is missed or  we are unable to reach the you after 3 attempts no additional outreach attempts will be made and the TOC follow-up will be closed .   If you need to speak to a Nurse you may  call me directly at the number below or if I am unavailable,and  your need is urgent  please call the main [facility]I number at [phone] and ask to speak with one of the [facility] ( Transition of Care )  Nurses  .                                                                               Additionally, If you experience worsening of your symptoms, develop shortness of breath, If you are experiencing a medical emergency,  develop suicidal or homicidal thoughts you must seek medical attention immediately by calling 911 or report to your local emergency department or urgent care.   If you have a non-emergency medical problem during routine business hours, please contact your provider's office and ask to speak with a nurse.       Please take the time to read instructions/literature along with the possible adverse reactions/side effects for all the Medicines that have been prescribed to you. Only take newly prescribed  Medications after you have completely understood and accept all the possible adverse reactions/side effects.   Do not take more than prescribed Medications for  Pain, Sleep and Anxiety. Do not drive when taking Pain medications or sleep aid/ insomnia  medications It is  not advisable to combine anxiety, sleep and pain medications without talking with your primary care practitioner    If you are experiencing a Mental Health or Behavioral Health Crisis or need someone to talk to Please call the Suicide and Crisis Lifeline: 12 You may also call the [place]  National Suicide Prevention Lifeline: 845-585-4486 or TTY: 409-496-7121 TTY 425-202-3627) to talk to a trained counselor.  You may call the Behavioral Health Crisis Line at 269-025-3060, at any time, 24 hours a day, 7 days a week- however If you are in danger or need immediate medical attention, call 911.   If you would like help to quit smoking, call 1-800-QUIT-NOW ( (769)052-2974) OR Espaol: 1-855-Djelo-Ya (8-144-664-6430) o para ms informacin haga clic aqu or Text READY to 799-599 to register via text.   Bari Mayans , BSN, RN Care Management Coordinator [place]   [facility] [email or web address] Direct Dial: [phone]

## 2023-08-20 NOTE — Patient Outreach (Signed)
 Care Management  Transitions of Care Program Transitions of Care Post-discharge week 2   08/20/2023 Name: Crystal Silva MRN: [id number] DOB: 11-30-1960  Subjective: Crystal Silva is a 63 y.o. year old female who is a primary care patient of Shona Norleen PEDLAR, MD. The Care Management team Engaged with patient Engaged with patient by telephone to assess and address transitions of care needs.   Consent to Services:  Patient was given information about care management services, agreed to services, and gave verbal consent to participate.   Assessment:     Patient voices no new complaints Patient has not developed/ reported any new Medical issues / Dx or acute changes.- since last follow-up call for most recent  Hospital stay    12/31/ 24 -08/13/23 She is doing well. Completed Steroid and ABT , Continues with O2 @ 3 l/ almost continuously. She is hoping to get a portable concentrator Has PCP follow-up 1/10 and Pulmonology f/y 1/14. She did not start her new Inhaler . Te copay is $100.00 Made referral to [facility] Pharmacist and she will follow-up with PCP and Pulmonology on possible patient assistance programs thru [employer].  Medication reconciliation / review completed based on most recent discharge summary and EHR medication list. Confirmed patient is aware of any changes to and / or dosage adjustments to medication regimen. Patient denies questions at this time  and reports financial  barriers to medication adherence. ( See above)  Patient educated on red flag s/s to watch for and was encouraged to report, any changes in baseline or  medication regimen,  changes in health status  /  well-being, safety concerns  or any new unmanaged side effects or symptoms not relieved with interventions  to PCP and / or the  [facility] Case Management team         SDOH Interventions    Flowsheet Row Telephone from 08/14/2023 in [place] POPULATION HEALTH DEPARTMENT  SDOH Interventions   Food Insecurity Interventions  Intervention Not Indicated  Housing Interventions Intervention Not Indicated  Transportation Interventions Intervention Not Indicated, Patient Resources (Friends/Family)  Utilities Interventions Intervention Not Indicated  Social Connections Interventions Intervention Not Indicated        Goals Addressed             This Visit's Progress    TOC Care Plan       Current Barriers:  Chronic Disease Management support and education needs related to COPD   RNCM Clinical Goal(s):  Patient will take all medications exactly as prescribed and will call provider for medication related questions as evidenced by no missed medication doses  attend all scheduled medical appointments: with PCP (08/21/23)and Pulmonologist  ( 08/25/23) as evidenced by no missed appointment  continue to work with RN Care Manager to address care management and care coordination needs related to  COPD as evidenced by adherence to CM Team Scheduled appointments through collaboration with RN Care manager, provider, and care team.   Interventions: Evaluation of current treatment plan related to  self management and patient's adherence to plan as established by provider  Transitions of Care:  New goal. Doctor Visits  - discussed the importance of doctor visits Post discharge activity limitations prescribed by provider reviewed Reviewed Signs and symptoms of infection  COPD Interventions:  (Status:  Goal on track:  Yes.) Short Term Goal Advised patient to track and manage COPD triggers Provided instruction about proper use of medications used for management of COPD including inhalers Advised patient to self  assesses COPD action plan zone and make appointment with provider if in the yellow zone for 48 hours without improvement Advised patient to engage in light exercise as tolerated 3-5 days a week to aid in the the management of COPD Provided education about and advised patient to utilize infection prevention strategies to  reduce risk of respiratory infection Discussed the importance of adequate rest and management of fatigue with COPD Discussed Pulmonary Rehab and offered to assist with referral placement Assessed social determinant of health barriers  Patient Goals/Self-Care Activities: Participate in Transition of Care Program/Attend TOC scheduled calls Take all medications as prescribed Attend all scheduled provider appointments Call pharmacy for medication refills 3-7 days in advance of running out of medications Perform all self care activities independently  Perform IADL's (shopping, preparing meals, housekeeping, managing finances) independently Call provider office for new concerns or questions  identify and avoid work-related triggers identify and remove indoor air pollutants limit outdoor activity during cold weather do breathing exercises every day attend pulmonary rehabilitation eliminate symptom triggers at home keep follow-up appointments: with PCP and Pulmonology use an extra pillow to sleep eat healthy/prescribed diet: Heart Healthy, Low Fat Carb Modified  use devices that will help like a cane, sock-puller or reacher practice relaxation or meditation daily do breathing exercises every day do exercises in a comfortable position that makes breathing as easy as possible  Follow Up Plan:   Follow-up   call 08/27/23 @ 2:30pm        Plan:  Routine follow-up and on-going assessment evaluation and education of disease processes, recommended interventions for both chronic and acute medical conditions , will occur during each weekly visit along with ongoing review of symptoms ,medication reviews and reconciliation. Any updates , inconsistencies, discrepancies or acute care concerns will be addressed and routed to the correct Practitioner if indicated   Based on current information and Insurance plan -Reviewed benefits available to patient, including details about eligibility options for care  if any area of needs were identified.  Reviewed patients ability to access and / or navigating the benefits system..Amb Referral made if indicted , refer to orders section of note for details   Please refer to Care Plan for goals and interventions -Effectiveness of interventions, symptom management and outcomes will be evaluated  weekly during [facility] 30-day Program Outreach calls  . Any necessary  changes and updates to Care Plan will be completed episodically    Reviewed goals for care Patient verbalizes understanding of instructions and care plan provided. Patient was encouraged to make informed decisions about their care, actively participate in managing their health condition, and implement lifestyle changes as needed to promote independence and self-management of health care    The patient has been provided with contact information for the care management team and has been advised to call with any health-related questions or concerns.  The patient has been provided with contact information for the care management team and has been advised to call with any health related questions or concerns.   Bari Mayans , BSN, RN Care Management Coordinator [place]   [facility] [email or web address] Direct Dial: [phone]

## 2023-08-21 DIAGNOSIS — Z7689 Persons encountering health services in other specified circumstances: Secondary | ICD-10-CM | POA: Diagnosis not present

## 2023-08-21 DIAGNOSIS — J441 Chronic obstructive pulmonary disease with (acute) exacerbation: Secondary | ICD-10-CM | POA: Diagnosis not present

## 2023-08-25 ENCOUNTER — Ambulatory Visit: Payer: BC Managed Care – PPO | Admitting: Pulmonary Disease

## 2023-08-25 ENCOUNTER — Encounter: Payer: Self-pay | Admitting: Pulmonary Disease

## 2023-08-25 VITALS — BP 120/68 | HR 76 | Ht 69.0 in | Wt 162.0 lb

## 2023-08-25 DIAGNOSIS — Z720 Tobacco use: Secondary | ICD-10-CM

## 2023-08-25 DIAGNOSIS — J449 Chronic obstructive pulmonary disease, unspecified: Secondary | ICD-10-CM | POA: Diagnosis not present

## 2023-08-25 DIAGNOSIS — R911 Solitary pulmonary nodule: Secondary | ICD-10-CM | POA: Diagnosis not present

## 2023-08-25 NOTE — Progress Notes (Signed)
 Crystal Silva    [id number]    [birth year]  Primary Care Physician:Hall, Norleen PEDLAR, MD  Referring Physician: Shona Norleen PEDLAR, MD [address] [place],  [place] [id number]  Chief complaint: Follow up for COPD  HPI: 63 y.o. who  history of tobacco abuse, COPD, bicuspid aortic valve, severe aortic stenosis, status post aortic valve replacement in 2022, and a right upper lobe pulmonary nodule.    She is an active smoker with history of COPD.  She has been on breztri since July 2023. Hospitalized in early nov 2023 for COPD exacerbation, covid and flu negative.  She was treated with azithromycin , prednisone , bronchodilators and discharged on 2 L oxygen .  She has stopped using the oxygen  as she is feeling better.  States that breathing is back to normal Continues to smoke  She follows with Dr. Kerrin for right upper lobe pulmonary nodule which is stable  Pets: 2 dogs Occupation: Geologist, engineering Exposures: No mold, hot tub, Jacuzzi.  No feather pillows or comforters Smoking history:  44-pack-year smoker.  Continues to smoke half pack per day Travel history: Grew up in [place] .  No significant recent travel Relevant family history: Father died of lung cancer.  He was a smoker.  Interim history: Discussed the use of AI scribe software for clinical note transcription with the patient, who gave verbal consent to proceed.  Crystal Silva, a patient with a history of COPD, presents after a recent hospital admission for a COPD exacerbation. She was hospitalized for a couple of days and has been on oxygen  therapy at three liters for over a year. She is currently on Trelegy for COPD management and was recently given Airsupra to try, which she reports works well. However, due to financial constraints and a high copay, she is unable to afford the Airsupra. She is currently not working and is in the process of setting up disability.  All hospitalization records reviewed in detail.  In addition to her  COPD, she has a history of lung nodules, which were last evaluated over a year ago with no change noted at that time. She is due for a repeat CT scan. She also has a history of heart surgery and is followed by a cardiologist.  She has a history of smoking and has made attempts to quit, with a recent cessation period of over three weeks. However, she has since resumed smoking and expresses difficulty in completely quitting.   Outpatient Encounter Medications as of 08/25/2023  Medication Sig   albuterol  (VENTOLIN  HFA) 108 (90 Base) MCG/ACT inhaler Inhale 2 puffs into the lungs every 6 (six) hours as needed for wheezing or shortness of breath.   Albuterol -Budesonide  (AIRSUPRA) 90-80 MCG/ACT AERO Inhale 1 puff into the lungs every 4 (four) hours as needed (wheezing, shortness of breath).   aspirin  EC 325 MG EC tablet Take 1 tablet (325 mg total) by mouth daily.   ipratropium-albuterol  (DUONEB) 0.5-2.5 (3) MG/3ML SOLN Take 3 mLs by nebulization every 4 (four) hours as needed (Shortness of breath/wheezing).   Multiple Vitamin (MULTIVITAMIN WITH MINERALS) TABS tablet Take 1 tablet by mouth daily.   naproxen sodium (ALEVE) 220 MG tablet Take 220 mg by mouth daily as needed (headache).   sertraline  (ZOLOFT ) 50 MG tablet Take 50 mg by mouth daily.   TRELEGY ELLIPTA 200-62.5-25 MCG/ACT AEPB Inhale 1 puff into the lungs daily.   metoprolol  tartrate (LOPRESSOR ) 25 MG tablet Take 1 tablet (25 mg total)  by mouth 2 (two) times daily.   [DISCONTINUED] azithromycin  (ZITHROMAX  Z-PAK) 250 MG tablet Take 1 tablet PO daily until gone (Patient not taking: Reported on 08/20/2023)   [DISCONTINUED] methylPREDNISolone  (MEDROL  DOSEPAK) 4 MG TBPK tablet Taper as directed (Patient not taking: Reported on 08/20/2023)   No facility-administered encounter medications on file as of 08/25/2023.   Physical Exam: Blood pressure 120/68, pulse 76, height 5' 9 (1.753 m), weight 162 lb (73.5 kg), last menstrual period 12/02/2013, SpO2  95%. Gen:      No acute distress HEENT:  EOMI, sclera anicteric Neck:     No masses; no thyromegaly Lungs:    Clear to auscultation bilaterally; normal respiratory effort CV:         Regular rate and rhythm; no murmurs Abd:      + bowel sounds; soft, non-tender; no palpable masses, no distension Ext:    No edema; adequate peripheral perfusion Skin:      Warm and dry; no rash Neuro: alert and oriented x 3 Psych: normal mood and affect   Data Reviewed: Imaging: CT chest 05/24/2022-centrilobular emphysema, 8 mm pulmonary nodule in the right upper lobe Chest x-ray 08/11/2023-diffuse bilateral opacities. I have reviewed the images personally.  PFTs: 04/27/2022 FVC 2.04 [51%], FEV1 0.89 [29%], F/F 44, DLCO 13.81 [57%] Severe obstruction, moderate-severe diffusion defect  Labs:  Assessment:  Chronic Obstructive Pulmonary Disease (COPD) PFTs reviewed with severe obstructive changes and diffusion impairment Recent hospitalization for COPD exacerbation. Currently on 3L oxygen  for over a year. Taking Trelegy and Airsupra, but affordability issues with Airsupra. -Continue Trelegy. -Explore options for Airsupra affordability, including contacting [employer] for potential patient assistance program. -Consider alternative to Airsupra if affordability remains an issue. -Order CT chest to make sure pulmonary infiltrates noted on recent chest x-ray have improved.  Tobacco Use Continued smoking with intermittent attempts at cessation. No interest in pharmacological aids for cessation. -Encourage consistent attempts at smoking cessation.  Lung Nodules Last CT scan over a year ago in 2023, no recent follow-up. -Order repeat CT scan to monitor lung nodules.  Portable Oxygen  Concentrator Difficulty with current oxygen  concentrator due to weight. -Explore options for prescribing a portable oxygen  concentrator.  Follow-up in 6 months.   Plan/Recommendations: Continue Trelegy Smoking  cessation CT chest  Lonna Coder MD [facility] 08/25/2023, 2:21 PM  CC: Shona Norleen PEDLAR, MD

## 2023-08-25 NOTE — Patient Instructions (Signed)
 VISIT SUMMARY:  Crystal Silva, you visited today to discuss your recent hospitalization for a COPD exacerbation and ongoing management of your condition. We also reviewed your history of lung nodules and smoking cessation efforts.  YOUR PLAN:  -CHRONIC OBSTRUCTIVE PULMONARY DISEASE (COPD): COPD is a chronic lung condition that makes it hard to breathe. You were recently hospitalized due to a worsening of your symptoms. You should continue using Trelegy as prescribed. We will look into options to help you afford Airsupra, including contacting [employer] for a patient assistance program. If we cannot find a solution, we may need to consider an alternative medication.  -TOBACCO USE: Continued smoking can worsen your COPD. Although you have made attempts to quit, it is important to keep trying. We encourage you to continue your efforts to stop smoking.  -LUNG NODULES: Lung nodules are small growths in the lungs that need to be monitored. Your last CT scan was over a year ago, so we will order a repeat CT scan to check for any changes.  -PORTABLE OXYGEN  CONCENTRATOR: A portable oxygen  concentrator can make it easier for you to manage your oxygen  therapy. We will explore options to prescribe a lighter, more portable oxygen  concentrator for you.  INSTRUCTIONS:  Please follow up in 6 months. We will also schedule a repeat CT scan to monitor your lung nodules.

## 2023-08-27 ENCOUNTER — Other Ambulatory Visit: Payer: Self-pay

## 2023-08-27 NOTE — Patient Instructions (Signed)
Visit Information  Thank you for taking time to visit with me today. Please don't hesitate to contact me if I can be of assistance to you before our next scheduled telephone appointment.  Our next appointment is by telephone on 09/01/23 at 2:30pm  Following is a copy of your care plan:   Goals Addressed             This Visit's Progress    TOC Care Plan       Current Barriers:  Chronic Disease Management support and education needs related to COPD   RNCM Clinical Goal(s):  Patient will take all medications exactly as prescribed and will call provider for medication related questions as evidenced by no missed medication doses  attend all scheduled medical appointments: with PCP (08/21/23) and Pulmonologist  ( 08/25/23)- completed no changes  as evidenced by no missed appointment  continue to work with RN Care Manager to address care management and care coordination needs related to  COPD as evidenced by adherence to CM Team Scheduled appointments through collaboration with RN Care manager, provider, and care team.   Interventions: Evaluation of current treatment plan related to  self management and patient's adherence to plan as established by provider  Transitions of Care:  Goal on track:  Yes. Doctor Visits  - discussed the importance of doctor visits Post discharge activity limitations prescribed by provider reviewed Reviewed Signs and symptoms of infection  COPD Interventions:  (Status:  Goal on track:  Yes.) Short Term Goal Advised patient to track and manage COPD triggers Provided instruction about proper use of medications used for management of COPD including inhalers Advised patient to self assesses COPD action plan zone and make appointment with provider if in the yellow zone for 48 hours without improvement Advised patient to engage in light exercise as tolerated 3-5 days a week to aid in the the management of COPD Provided education about and advised patient to utilize  infection prevention strategies to reduce risk of respiratory infection Discussed the importance of adequate rest and management of fatigue with COPD Discussed Pulmonary Rehab and offered to assist with referral placement Assessed social determinant of health barriers  Patient Goals/Self-Care Activities: Participate in Transition of Care Program/Attend TOC scheduled calls Take all medications as prescribed Attend all scheduled provider appointments Call pharmacy for medication refills 3-7 days in advance of running out of medications Perform all self care activities independently  Perform IADL's (shopping, preparing meals, housekeeping, managing finances) independently Call provider office for new concerns or questions  identify and avoid work-related triggers identify and remove indoor air pollutants limit outdoor activity during cold weather do breathing exercises every day attend pulmonary rehabilitation eliminate symptom triggers at home keep follow-up appointments: with PCP and Pulmonology use an extra pillow to sleep eat healthy/prescribed diet: Heart Healthy, Low Fat Carb Modified  use devices that will help like a cane, sock-puller or reacher practice relaxation or meditation daily do breathing exercises every day do exercises in a comfortable position that makes breathing as easy as possible  Follow Up Plan:   Follow-up   call 09/01/23 @ 2:30pm        Reviewed goals for care Patient/ Caregiver verbalizes understanding of instructions with the plan of care . The  Patient / Caregiver was encouraged to make informed decisions about care, actively participate in managing health conditions, and implement lifestyle changes as needed to promote independence and self-management of healthcare. SDOH screenings have been completed and addressed if indicted There are  no reported barriers to care.    Follow-up Plan [facility] Case Management Nurse will provide follow-up and on-going  assessment ,evaluation and education of disease processes, recommended interventions for both chronic and acute medical conditions ,  along with ongoing review of symptoms ,medication reviews / reconciliation during each weekly call . Any updates , inconsistencies, discrepancies or acute care concerns will be addressed and routed to the correct Practitioner if indicated   [facility]  Please call the care guide team at [phone]  if you need to cancel or reschedule your appointment . For scheduled calls -Three attempts will be made to reach you -if the scheduled call is missed or  we are unable to reach the you after 3 attempts no additional outreach attempts will be made and the TOC follow-up will be closed .   If you need to speak to a Nurse you may  call me directly at the number below or if I am unavailable,and  your need is urgent  please call the main [facility] number at [phone] and ask to speak with one of the [facility] ( Transition of Care )  Nurses  .                                                                               Additionally, If you experience worsening of your symptoms, develop shortness of breath, If you are experiencing a medical emergency,  develop suicidal or homicidal thoughts you must seek medical attention immediately by calling 911 or report to your local emergency department or urgent care.   If you have a non-emergency medical problem during routine business hours, please contact your provider's office and ask to speak with a nurse.       Please take the time to read instructions/literature along with the possible adverse reactions/side effects for all the Medicines that have been prescribed to you. Only take newly prescribed  Medications after you have completely understood and accept all the possible adverse reactions/side effects.   Do not take more than prescribed Medications for  Pain, Sleep and Anxiety. Do not drive when taking Pain medications  or sleep aid/ insomnia  medications It is not advisable to combine anxiety, sleep and pain medications without talking with your primary care practitioner    If you are experiencing a Mental Health or Behavioral Health Crisis or need someone to talk to Please call the Suicide and Crisis Lifeline: 988 You may also call the [place] National Suicide Prevention Lifeline: (765) 820-1694 or TTY: (838)190-9463 TTY 867-063-4681) to talk to a trained counselor.  You may call the Behavioral Health Crisis Line at (508)250-1084, at any time, 24 hours a day, 7 days a week- however If you are in danger or need immediate medical attention, call 911.   If you would like help to quit smoking, call 1-800-QUIT-NOW ( (201)617-4431) OR Espaol: 1-855-Djelo-Ya (6-606-301-6010) o para ms informacin haga clic aqu or Text READY to 932-355 to register via text.   Susa Loffler , BSN, RN [facility]   [facility] RN Care Manager Direct Dial [phone] Fax [phone] Website: [email or web address]

## 2023-08-27 NOTE — Patient Outreach (Signed)
Care Management  Transitions of Care Program Transitions of Care Post-discharge week 3   08/27/2023 Name: Crystal Silva MRN: [id number] DOB: [birth year]  Subjective: Crystal Silva is a 63 y.o. year old female who is a primary care patient of Benita Stabile, MD. The Care Management team Engaged with patient Engaged with patient by telephone to assess and address transitions of care needs.   Consent to Services:  Patient was given information about care management services, agreed to services, and gave verbal consent to participate.   Assessment:   Patient/ Caregiver  voices no new complaints or concerns  and has not developed/ reported any new medical issues / Dx or acute changes. - since last follow-up call for most recent  Hospital stay   08/11/23-1/2/ 2025 She is doing well.She saw PCP 1/10 with [facility]. She did discuss the coat concerns of Airsupra inhaler ans has paperwork for [employer] cost assist program. She will drop off the paperwork at PCP office for them to compete their portion. She was seen at Pulmonologist office for Oxygen Saturation testing. Per patient the tech reported she would not qualify since she needed continuous oxygen and portable concentrators deliver pulse O2 therapy Will reach our to oxygen companies regarding the [employer] Concentrator  Call paced to [employer] [phone] to review qualifications for approval.unable to reach a live person despite long wait times. Call placed to  [employer] [phone]. Colleen Can for Customer service to call back to discuss patient options  Pharmacy med review is pending at this time  Medication reconciliation / review completed based on most recent discharge summary and EHR medication list. Confirmed patient is taking all newly prescribed medications as instructed (any discrepancies are noted in review section)   Patient / Caregiver is aware of any changes to and / or  any dosage adjustments to medication regimen. Patient/ Caregiver denies questions  at this time and reports no barriers to medication adherence  Patient / Caregiver educated on red flag s/s to watch for and was encouraged to report, any changes in baseline or  medication regimen,  changes in health status  /  well-being, safety concerns  or any new unmanaged side effects or symptoms not relieved with interventions  to PCP and / or the  VBCI Case Management team           SDOH Interventions    Flowsheet Row Telephone from 08/14/2023 in [place] POPULATION HEALTH DEPARTMENT  SDOH Interventions   Food Insecurity Interventions Intervention Not Indicated  Housing Interventions Intervention Not Indicated  Transportation Interventions Intervention Not Indicated, Patient Resources (Friends/Family)  Utilities Interventions Intervention Not Indicated  Social Connections Interventions Intervention Not Indicated        Goals Addressed             This Visit's Progress    TOC Care Plan       Current Barriers:  Chronic Disease Management support and education needs related to COPD   RNCM Clinical Goal(s):  Patient will take all medications exactly as prescribed and will call provider for medication related questions as evidenced by no missed medication doses  attend all scheduled medical appointments: with PCP (08/21/23) and Pulmonologist  ( 08/25/23)- completed no changes  as evidenced by no missed appointment  continue to work with RN Care Manager to address care management and care coordination needs related to  COPD as evidenced by adherence to CM Team Scheduled appointments through collaboration with RN Care manager, provider, and care team.  Interventions: Evaluation of current treatment plan related to  self management and patient's adherence to plan as established by provider  Transitions of Care:  Goal on track:  Yes. Doctor Visits  - discussed the importance of doctor visits Post discharge activity limitations prescribed by provider reviewed Reviewed Signs  and symptoms of infection  COPD Interventions:  (Status:  Goal on track:  Yes.) Short Term Goal Advised patient to track and manage COPD triggers Provided instruction about proper use of medications used for management of COPD including inhalers Advised patient to self assesses COPD action plan zone and make appointment with provider if in the yellow zone for 48 hours without improvement Advised patient to engage in light exercise as tolerated 3-5 days a week to aid in the the management of COPD Provided education about and advised patient to utilize infection prevention strategies to reduce risk of respiratory infection Discussed the importance of adequate rest and management of fatigue with COPD Discussed Pulmonary Rehab and offered to assist with referral placement Assessed social determinant of health barriers  Patient Goals/Self-Care Activities: Participate in Transition of Care Program/Attend TOC scheduled calls Take all medications as prescribed Attend all scheduled provider appointments Call pharmacy for medication refills 3-7 days in advance of running out of medications Perform all self care activities independently  Perform IADL's (shopping, preparing meals, housekeeping, managing finances) independently Call provider office for new concerns or questions  identify and avoid work-related triggers identify and remove indoor air pollutants limit outdoor activity during cold weather do breathing exercises every day attend pulmonary rehabilitation eliminate symptom triggers at home keep follow-up appointments: with PCP and Pulmonology use an extra pillow to sleep eat healthy/prescribed diet: Heart Healthy, Low Fat Carb Modified  use devices that will help like a cane, sock-puller or reacher practice relaxation or meditation daily do breathing exercises every day do exercises in a comfortable position that makes breathing as easy as possible  Follow Up Plan:   Follow-up   call  09/01/23 @ 2:30pm        Plan:  Routine follow-up and on-going assessment evaluation and education of disease processes, recommended interventions for both chronic and acute medical conditions , will occur during each weekly visit along with ongoing review of symptoms ,medication reviews and reconciliation. Any updates , inconsistencies, discrepancies or acute care concerns will be addressed and routed to the correct Practitioner if indicated   Based on current information and Insurance plan -Reviewed benefits available to patient, including details about eligibility options for care if any area of needs were identified.  Reviewed patients ability to access and / or navigating the benefits system..Amb Referral made if indicted , refer to orders section of note for details   Please refer to Care Plan for goals and interventions -Effectiveness of interventions, symptom management and outcomes will be evaluated  weekly during [facility] 30-day Program Outreach calls  . Any necessary  changes and updates to Care Plan will be completed episodically    Reviewed goals for care Patient verbalizes understanding of instructions and care plan provided. Patient was encouraged to make informed decisions about their care, actively participate in managing their health condition, and implement lifestyle changes as needed to promote independence and self-management of health care    The patient has been provided with contact information for the care management team and has been advised to call with any health-related questions or concerns.     Susa Loffler , BSN, RN [facility] RN Care  Radiation protection practitioner [phone] Fax [phone] Website: [email or web address]

## 2023-08-28 ENCOUNTER — Telehealth: Payer: Self-pay

## 2023-08-28 NOTE — Transitions of Care (Post Inpatient/ED Visit) (Signed)
   08/28/2023  Name: Crystal Silva MRN: [id number] DOB: [birth year] Call placed to patient in regards to follow-up on Portable Concentrator Call paced to [employer] ( [phone]) Spoke to Crystal Silva [phone]) Fax (  [phone])  For Insurance coverage patient will need either a 6 minute walk test or a 3 point  ) test which consist of  1 point O2 saturation at rest No O2 / on RA  2 patient Oxygen saturation with exertion on RA  3 patient oxygen saturation with exertion on Oxygen  if 88 % or below at point 1 or 2 patient may qualify for concentrator under Insurance . Patient aware of above and has contact numbers  There is discussion in regards to continuous 02 vs Pulse O2. She will call and obtain test results completed at visit 1/15 and discuss with Dr Margo Aye as well.   Once results oft est done 1/15 are available there may be additional follow-up to process of potentially obtaining a portable concentrator which is available for a 30 day trial       Susa Loffler , BSN, RN [facility]   [facility] RN Care Manager Direct Dial [phone] Fax [phone] Website: [email or web address]

## 2023-09-01 ENCOUNTER — Other Ambulatory Visit: Payer: Self-pay

## 2023-09-01 NOTE — Patient Outreach (Signed)
Care Management  Transitions of Care Program Transitions of Care Post-discharge week 4   09/01/2023 Name: Crystal Silva MRN: [id number] DOB: 19-Nov-1960  Subjective: Crystal Silva is a 63 y.o. year old female who is a primary care patient of Benita Stabile, MD. The Care Management team Engaged with patient Engaged with patient by telephone to assess and address transitions of care needs.   Consent to Services:  Patient was given information about care management services, agreed to services, and gave verbal consent to participate.   Assessment:   Patient/ Caregiver  voices no new complaints or concerns  and has not developed/ reported any new medical issues / Dx or acute changes. - since last follow-up call for most recent  Hospital stay   08/11/23-1/2 / 2025 She is doing pretty well at home She is using her O2 @ 3l/m almost continuously She does report some soreness to her nose and is applying moisturizer. She was not able to drop off forms to PCP yesterday due to holidy. She is also reaching our to [place] at [place] this week. No med changes she is not using the Airsupra due to cost. She is also following up with testing results  she had done at Pulmonologist last week. Unable to,locate this test documentation and determine if it was 6 minute or 3 pont test. Per pharmacist there was an attempt made to contact the patient 1/24, and 1/15 with no luck.  Medication reconciliation / review completed based on most recent discharge summary and EHR medication list. Confirmed patient is taking all newly prescribed medications as instructed (any discrepancies are noted in review section)   Patient / Caregiver is aware of any changes to and / or  any dosage adjustments to medication regimen. Patient/ Caregiver denies questions at this time and reports no barriers to medication adherence  Patient / Caregiver educated on red flag s/s to watch for and was encouraged to report, any changes in baseline or   medication regimen,  changes in health status  /  well-being, safety concerns  or any new unmanaged side effects or symptoms not relieved with interventions  to PCP and / or the  VBCI Case Management team           SDOH Interventions    Flowsheet Row Telephone from 08/14/2023 in [place] POPULATION HEALTH DEPARTMENT  SDOH Interventions   Food Insecurity Interventions Intervention Not Indicated  Housing Interventions Intervention Not Indicated  Transportation Interventions Intervention Not Indicated, Patient Resources (Friends/Family)  Utilities Interventions Intervention Not Indicated  Social Connections Interventions Intervention Not Indicated        Goals Addressed             This Visit's Progress    TOC Care Plan       Current Barriers:  Chronic Disease Management support and education needs related to COPD   RNCM Clinical Goal(s):  Patient will take all medications exactly as prescribed and will call provider for medication related questions as evidenced by no missed medication doses  attend all scheduled medical appointments: with PCP (08/21/23) and Pulmonologist  ( 08/25/23)- completed no changes  as evidenced by no missed appointment  continue to work with RN Care Manager to address care management and care coordination needs related to  COPD as evidenced by adherence to CM Team Scheduled appointments through collaboration with RN Care manager, provider, and care team.   Interventions: Evaluation of current treatment plan related to  self management and patient's  adherence to plan as established by provider  Transitions of Care:  Goal on track:  Yes. Doctor Visits  - discussed the importance of doctor visits Post discharge activity limitations prescribed by provider reviewed Reviewed Signs and symptoms of infection  COPD Interventions:  (Status:  Goal on track:  Yes.) Short Term Goal Advised patient to track and manage COPD triggers Provided instruction about  proper use of medications used for management of COPD including inhalers Advised patient to self assesses COPD action plan zone and make appointment with provider if in the yellow zone for 48 hours without improvement Advised patient to engage in light exercise as tolerated 3-5 days a week to aid in the the management of COPD Provided education about and advised patient to utilize infection prevention strategies to reduce risk of respiratory infection Discussed the importance of adequate rest and management of fatigue with COPD Discussed Pulmonary Rehab and offered to assist with referral placement Assessed social determinant of health barriers  Patient Goals/Self-Care Activities: Participate in Transition of Care Program/Attend TOC scheduled calls Take all medications as prescribed Attend all scheduled provider appointments Call pharmacy for medication refills 3-7 days in advance of running out of medications Perform all self care activities independently  Perform IADL's (shopping, preparing meals, housekeeping, managing finances) independently Call provider office for new concerns or questions  identify and avoid work-related triggers identify and remove indoor air pollutants limit outdoor activity during cold weather do breathing exercises every day attend pulmonary rehabilitation eliminate symptom triggers at home keep follow-up appointments: with PCP and Pulmonology use an extra pillow to sleep eat healthy/prescribed diet: Heart Healthy, Low Fat Carb Modified  use devices that will help like a cane, sock-puller or reacher practice relaxation or meditation daily do breathing exercises every day do exercises in a comfortable position that makes breathing as easy as possible  Follow Up Plan:   Follow-up   call 09/07/23 @ 11:00am        Plan:  Routine follow-up and on-going assessment evaluation and education of disease processes, recommended interventions for both chronic and  acute medical conditions , will occur during each weekly visit along with ongoing review of symptoms ,medication reviews and reconciliation. Any updates , inconsistencies, discrepancies or acute care concerns will be addressed and routed to the correct Practitioner if indicated   Based on current information and Insurance plan -Reviewed benefits available to patient, including details about eligibility options for care if any area of needs were identified.  Reviewed patients ability to access and / or navigating the benefits system..Amb Referral made if indicted , refer to orders section of note for details   Please refer to Care Plan for goals and interventions -Effectiveness of interventions, symptom management and outcomes will be evaluated  weekly during [facility] 30-day Program Outreach calls  . Any necessary  changes and updates to Care Plan will be completed episodically    Reviewed goals for care Patient verbalizes understanding of instructions and care plan provided. Patient was encouraged to make informed decisions about their care, actively participate in managing their health condition, and implement lifestyle changes as needed to promote independence and self-management of health care   The patient has been provided with contact information for the care management team and has been advised to call with any health-related questions or concerns. Follow up as indicated with Care Team , or sooner should any new problems arise.      Susa Loffler , BSN, RN [employer]  RN Care Manager Direct Dial [phone] Fax [phone] Website: [email or web address]

## 2023-09-03 ENCOUNTER — Encounter: Payer: Self-pay | Admitting: Pulmonary Disease

## 2023-09-03 ENCOUNTER — Other Ambulatory Visit: Payer: Self-pay

## 2023-09-03 IMAGING — CT CT ANGIO CHEST
2 of 6 series · 18 of 36 positions shown · IV contrast (Omnipaque or Isovue)
Comparison: Chest x-ray from earlier in the same day, CT from
05/01/2021.

CLINICAL DATA: Shortness of breath, known right hydropneumothorax.

EXAM:
CT ANGIOGRAPHY CHEST WITH CONTRAST
TECHNIQUE: Multidetector CT imaging of the chest was performed using the
standard protocol during bolus administration of intravenous
contrast. Multiplanar CT image reconstructions and MIPs were
obtained to evaluate the vascular anatomy.
CONTRAST:  100mL OMNIPAQUE IOHEXOL 350 MG/ML SOLN

[Series 5: pe axial thins · axial · 0.56mm/px · z∈[+1302,+1561]mm · 17 of 360 slices shown]
[im 18/360  lung]
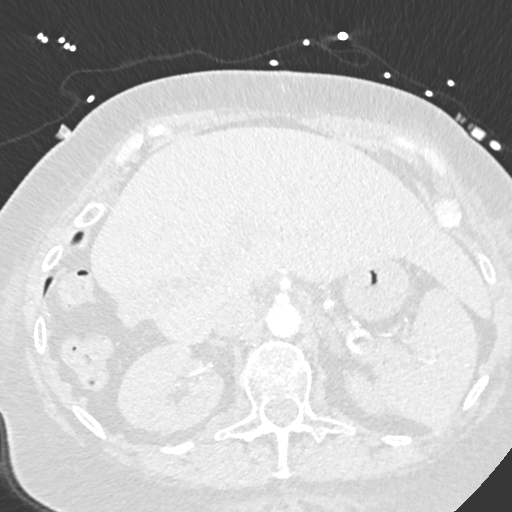
[im 35/360  mediastinal]
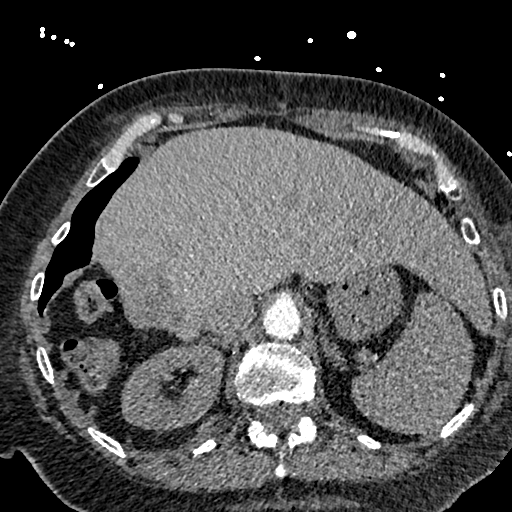
[im 52/360  lung]
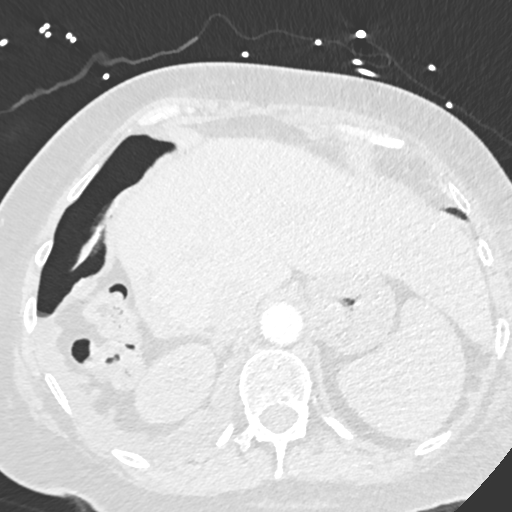
[im 86/360  mediastinal]
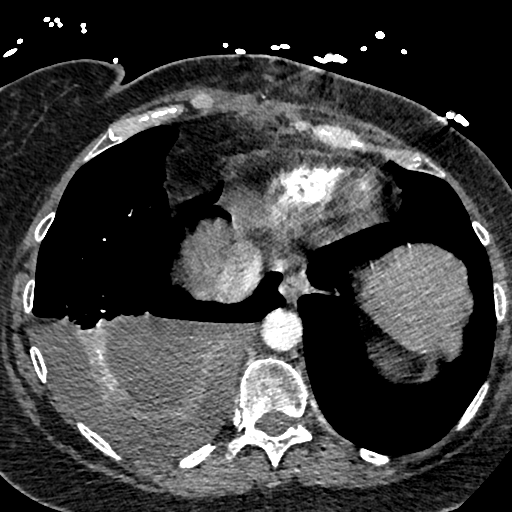
[im 103/360  lung]
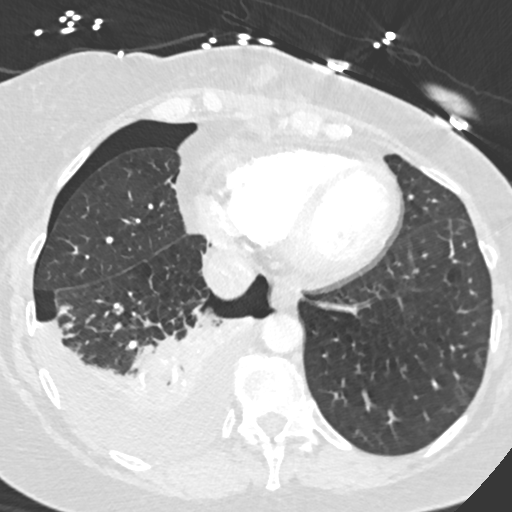
[im 120/360  mediastinal]
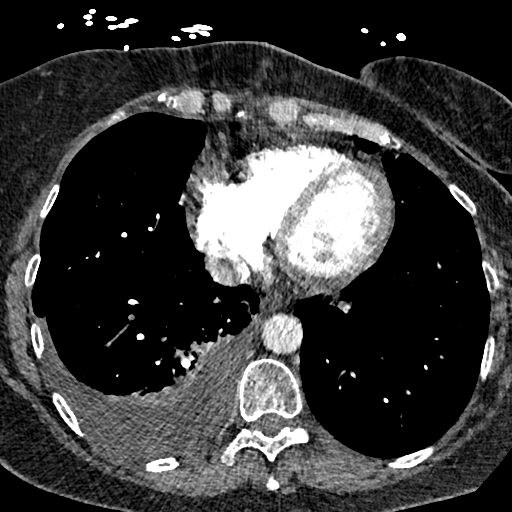
[im 137/360  lung]
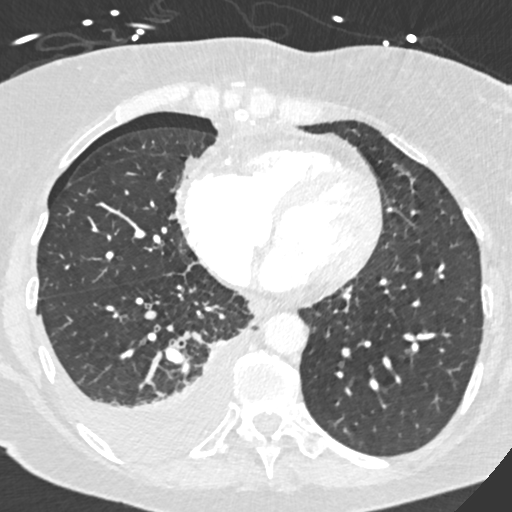
[im 154/360  mediastinal]
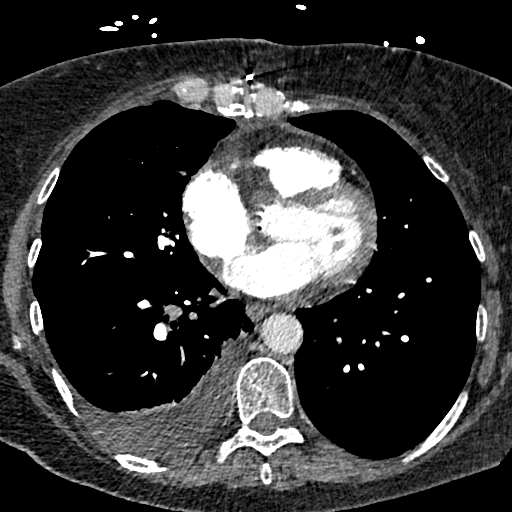
[im 189/360  lung]
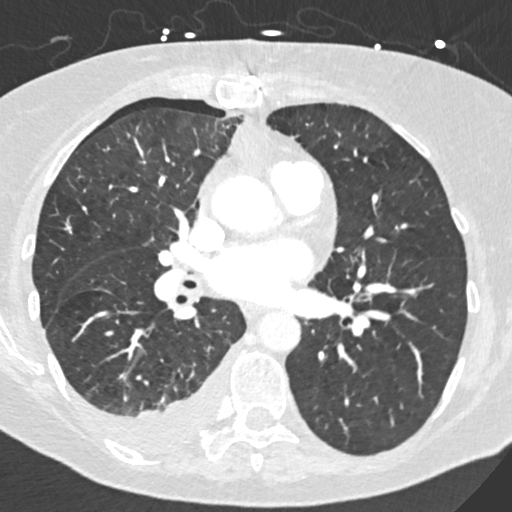
[im 206/360  mediastinal]
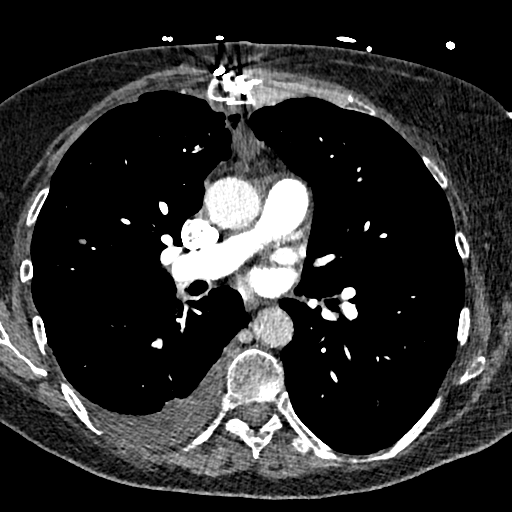
[im 223/360  lung]
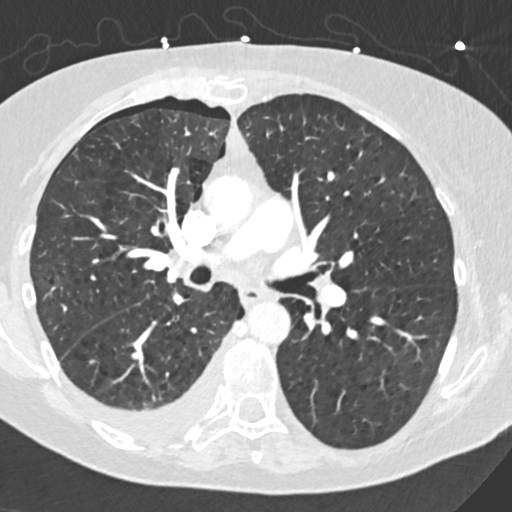
[im 240/360  mediastinal]
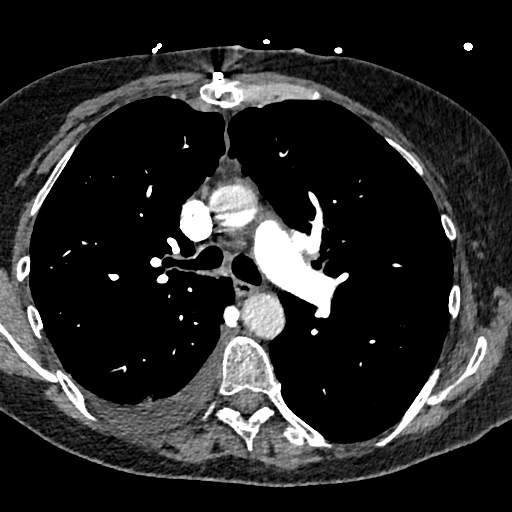
[im 257/360  lung]
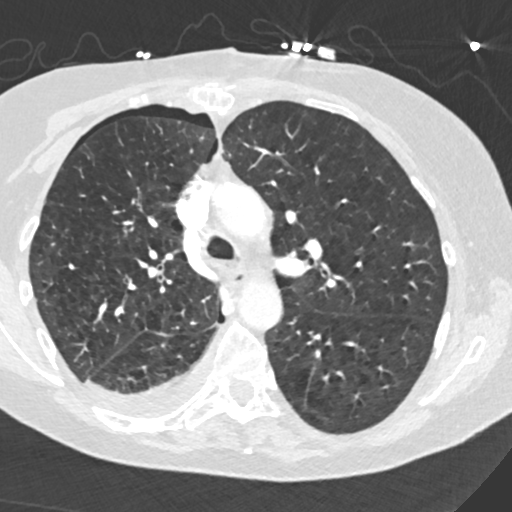
[im 274/360  mediastinal]
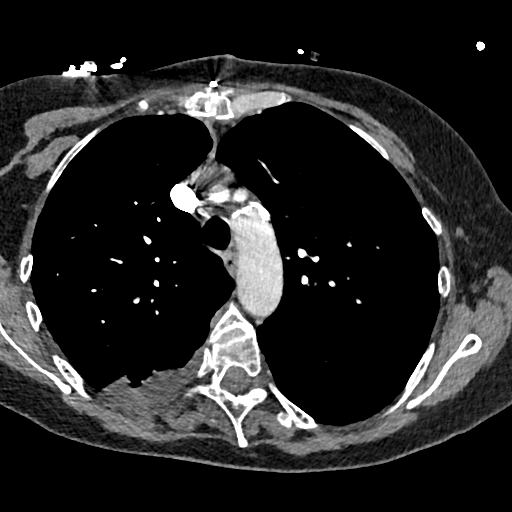
[im 308/360  lung]
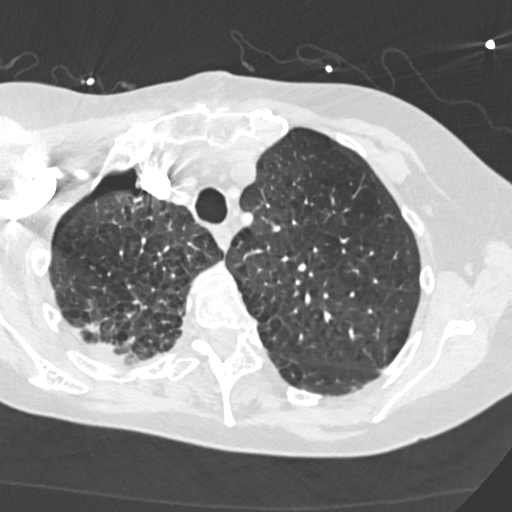
[im 325/360  mediastinal]
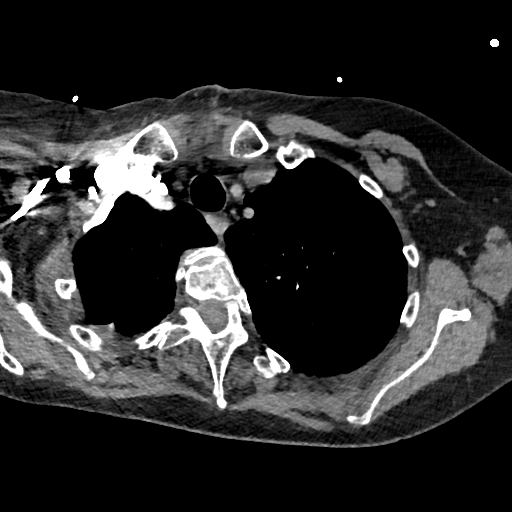
[im 342/360  lung]
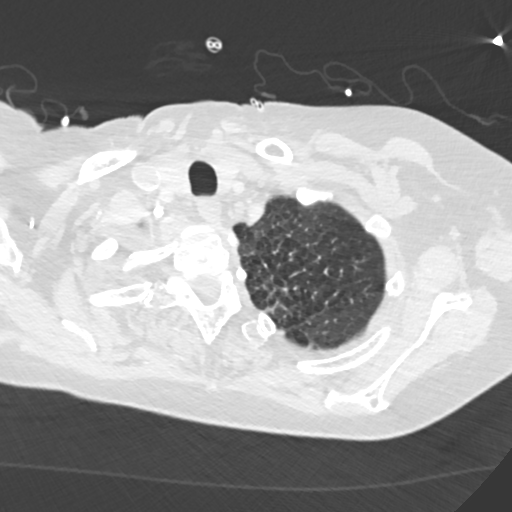

[Series 8: cor soft · coronal · 0.59mm/px · 1 of 115 slices shown]
[im 58/115  mediastinal]
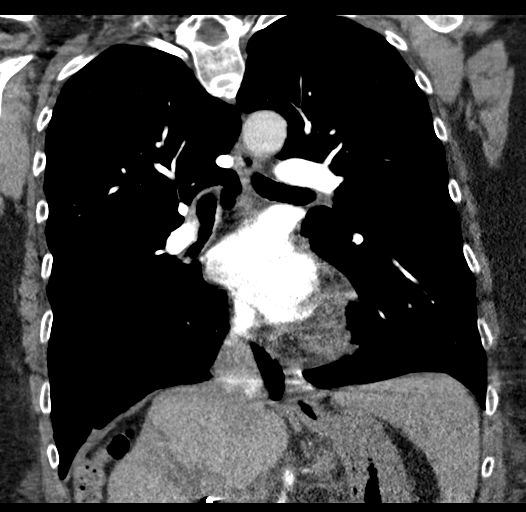

[18 of 36 positions shown; findings below may reference images not displayed]

FINDINGS: Cardiovascular: Thoracic aorta demonstrates atherosclerotic
calcifications without aneurysmal dilatation or dissection. Changes
of prior aortic valve replacement are seen. No cardiac enlargement
is noted. The pulmonary artery is well visualized within normal
branching pattern. No filling defect to suggest pulmonary embolism
is noted.

Mediastinum/Nodes: Thoracic inlet is within normal limits. No
sizable hilar or mediastinal adenopathy is noted. The esophagus is
within normal limits.

Lungs/Pleura: Left lung demonstrates emphysematous changes. No focal
infiltrate or sizable effusion is seen. No parenchymal nodules are
noted. Right lung also demonstrates emphysematous change although
lower lobe atelectasis is seen as well as a right hydropneumothorax.
The effusion component is moderate to large in size. Stable right
upper lobe nodule measuring 9 mm is seen

Upper Abdomen: Chronic atrophic changes are noted within the right
lobe of the liver similar to that seen on the prior exam.
Gallbladder has been surgically removed. No other focal abnormality
is noted in the upper abdomen.

Musculoskeletal: Bony structures show degenerative change of the
thoracic spine. No rib fractures are seen. Changes of prior sternal
plating are noted as well.

Review of the MIP images confirms the above findings.
IMPRESSION: No evidence of pulmonary emboli.

Right hydropneumothorax similar to that seen on recent chest x-ray
but improved from the prior exam from 05/25/2021.

Stable right upper lobe nodule.  Follow-up as previously described.

Chronic changes in the right lobe of the liver

Aortic Atherosclerosis (PX9Q2-HUJ.J) and Emphysema (PX9Q2-V8P.9).

## 2023-09-03 NOTE — Progress Notes (Signed)
09/03/2023  Patient ID: Crystal Silva, female   DOB: Mar 23, 1961, 63 y.o.   MRN: [id number]  Reason for referral: Medication Assistance  Referral source: Susa Loffler, RN Care Manager (VBCI) [facility] program Referral medication(s): Crystal Silva; review other nebs and  Current insurance:[employer] Commercial  PMHx: H/o repeated COPD exacerbations (most recent on 08/11/2023).    HPI:The patient reports completing a course of oral steroids and antibiotics following a recent COPD exacerbation. She intends to apply for disability soon, and today marks her last day of receiving income. I informed the patient that if her insurance changes to a government-funded program due to disability, any adjustments to her copayments made today (e.g., formulary restrictions, copay amounts, etc.) may no longer be applicable.   Objective: No Known Allergies  Medications Reviewed Today     Reviewed by Katha Cabal, [facility] (Pharmacist) on 09/03/23 at 1432  Med List Status: <None>   Medication Order Taking? Sig Documenting Provider Last Dose Status Informant  Albuterol-Budesonide (Crystal Silva) 90-80 MCG/ACT AERO [id number] Yes Inhale 1 puff into the lungs every 4 (four) hours as needed (wheezing, shortness of breath). [provider] Taking Active Self           Med Note ([name], [name]   Thu Aug 20, 2023 11:30 AM) Co pay $100   aspirin EC 325 MG EC tablet [id number] Yes Take 1 tablet (325 mg total) by mouth daily. Rowe Clack, PA-C Taking Active Self  ipratropium-albuterol (Crystal Silva) 0.5-2.5 (3) MG/3ML SOLN [id number] Yes Take 3 mLs by nebulization every 4 (four) hours as needed (Shortness of breath/wheezing). Glade [name], MD Taking Active Self  metoprolol tartrate (Crystal Silva) 25 MG tablet [id number]  Take 1 tablet (25 mg total) by mouth 2 (two) times daily. Netta Neat., NP  Expired 08/12/23 2359 Self           Med Note ([name], [name]   Fri Aug 14, 2023 10:09 AM) She has some will get renewed   Multiple  Vitamin (MULTIVITAMIN WITH MINERALS) TABS tablet [id number] Yes Take 1 tablet by mouth daily. [provider] Taking Active Self  naproxen sodium (ALEVE) 220 MG tablet [id number] Yes Take 220 mg by mouth daily as needed (headache). [provider] Taking Active Self  sertraline (ZOLOFT) 50 MG tablet [id number] Yes Take 50 mg by mouth daily. [provider] Taking Active Self  Dwyane Luo 200-62.5-25 MCG/ACT AEPB [id number] Yes Inhale 1 puff into the lungs daily. [provider] Taking Active Self           Med Note Para March, [facility] R   Thu Sep 03, 2023  2:32 PM) Copay $50/ month and not aware of discount card            Assessment: The patient reports consistent adherence to her Crystal Silva regimen, denies missing doses, and confirms appropriate inhaler technique. She uses a nebulizer twice daily, along with albuterol, as prescribed. The patient pays $50 per month for Crystal Silva, which she finds expensive but manageable.  Medication Review Findings:  The patient reports compliance with her COPD medications, with the exception of Crystal Silva, which she is unable to afford due to cost. Since the patient has Nurse, learning disability, she will need assistance in applying for a coupon savings card, as she is not currently eligible for patient assistance programs. Crystal Silva replaces albuterol inhaler   Medication Assistance Findings:  Medication assistance needs identified: Crystal Silva and Crystal Silva   [employer] Card: BIN:    [id number]   PCN:  [id number]   GRP:   [id number]   ID:       [id number]   Crystal Silva  Coupon Savings Card:     Additional medication assistance options reviewed with patient as warranted:  Coupon and Physiological scientist  Plan: Enrolled patient in coupon savings program I called [facility], with patient on 3-way, to provide coupon savings card. Confirmed both Crystal Silva and Crystal Silva are $0 copay at this time;saving patient $150 per month.    Time spent: 45 minutes  Thank you for allowing pharmacy to be a part of this patient's care.   Cephus Shelling, PharmD Clinical Pharmacist Cell: [phone]

## 2023-09-03 NOTE — Progress Notes (Signed)
A user error has taken place: encounter opened in error, closed for administrative reasons.  Cephus Shelling, PharmD Clinical Pharmacist Cell: [phone]

## 2023-09-07 ENCOUNTER — Other Ambulatory Visit: Payer: Self-pay

## 2023-09-07 NOTE — Patient Instructions (Addendum)
Visit Information  Thank you for taking time to visit with me today. Please don't hesitate to contact me if I can be of assistance to you '  Patient had call with Pharmacy with assisted in enrolling patient in Coupon saving plan and copay went from $150.00 to $0.00 x 12 months .   Following is a copy of your care plan:   Goals Addressed             This Visit's Progress    COMPLETED: TOC Care Plan       Current Barriers:  Chronic Disease Management support and education needs related to COPD   RNCM Clinical Goal(s):  Patient will take all medications exactly as prescribed and will call provider for medication related questions as evidenced by no missed medication doses  attend all scheduled medical appointments: with PCP (08/21/23) and Pulmonologist  ( 08/25/23)- completed no changes  as evidenced by no missed appointment  continue to work with RN Care Manager to address care management and care coordination needs related to  COPD as evidenced by adherence to CM Team Scheduled appointments through collaboration with RN Care manager, provider, and care team.   Interventions: Evaluation of current treatment plan related to  self management and patient's adherence to plan as established by provider  Transitions of Care:  Goal Met. Doctor Visits  - discussed the importance of doctor visits Post discharge activity limitations prescribed by provider reviewed Reviewed Signs and symptoms of infection  COPD Interventions:  (Status:  Goal Met.) Short Term Goal Advised patient to track and manage COPD triggers Provided instruction about proper use of medications used for management of COPD including inhalers Advised patient to self assesses COPD action plan zone and make appointment with provider if in the yellow zone for 48 hours without improvement Advised patient to engage in light exercise as tolerated 3-5 days a week to aid in the the management of COPD Provided education about and advised  patient to utilize infection prevention strategies to reduce risk of respiratory infection Discussed the importance of adequate rest and management of fatigue with COPD Discussed Pulmonary Rehab and offered to assist with referral placement Assessed social determinant of health barriers  Patient Goals/Self-Care Activities: Participate in Transition of Care Program/Attend TOC scheduled calls Take all medications as prescribed Attend all scheduled provider appointments Call pharmacy for medication refills 3-7 days in advance of running out of medications Perform all self care activities independently  Perform IADL's (shopping, preparing meals, housekeeping, managing finances) independently Call provider office for new concerns or questions  identify and avoid work-related triggers identify and remove indoor air pollutants limit outdoor activity during cold weather do breathing exercises every day attend pulmonary rehabilitation eliminate symptom triggers at home keep follow-up appointments: with PCP and Pulmonology use an extra pillow to sleep eat healthy/prescribed diet: Heart Healthy, Low Fat Carb Modified  use devices that will help like a cane, sock-puller or reacher practice relaxation or meditation daily do breathing exercises every day do exercises in a comfortable position that makes breathing as easy as possible  Follow Up Plan:   Patient  verbalizes understanding of instructions and care plan provided. Patient was encouraged to make informed decisions about their care, actively participate in managing their health condition, and implement lifestyle changes as needed to promote independence and self-management of health care The patient has been provided with contact information for the care management team and has been advised to call with any health-related questions or concerns.  Medication review  Reviewed current home medications -- provided education as needed.  Patient is aware of potential side effects and was encouraged to notify PCP for any adverse side effects or unwanted symptoms not relieved with interventions Patient will call 911 for Medical Emergencies or Life -Threatening Symptoms.  Reviewed goals for care Patient/ Caregiver verbalizes understanding of instructions with the plan of care . The  Patient / Caregiver was encouraged to make informed decisions about care, actively participate in managing health conditions, and implement lifestyle changes as needed to promote independence and self-management of healthcare. SDOH screenings have been completed and addressed if indicted There are no reported barriers to care.    Follow-up Plan [facility] Case Management Nurse will provide follow-up and on-going assessment ,evaluation and education of disease processes, recommended interventions for both chronic and acute medical conditions ,  along with ongoing review of symptoms ,medication reviews / reconciliation during each weekly call . Any updates , inconsistencies, discrepancies or acute care concerns will be addressed and routed to the correct Practitioner if indicated   [facility]  Please call the care guide team at [phone]  if you need to cancel or reschedule your appointment . For scheduled calls -Three attempts will be made to reach you -if the scheduled call is missed or  we are unable to reach the you after 3 attempts no additional outreach attempts will be made and the TOC follow-up will be closed .   If you need to speak to a Nurse you may  call me directly at the number below or if I am unavailable,and  your need is urgent  please call the main [facility] number at [phone] and ask to speak with one of the [facility] ( Transition of Care )  Nurses  .  Patient was encouraged to Contact PCP with any questions or concerns regarding ongoing medical care, any difficulty obtaining or picking up prescriptions, any changes or worsening in  condition including signs / symptoms not relieved  with interventions                                                                               Additionally, If you experience worsening of your symptoms, develop shortness of breath, If you are experiencing a medical emergency,  develop suicidal or homicidal thoughts you must seek medical attention immediately by calling 911 or report to your local emergency department or urgent care.   If you have a non-emergency medical problem during routine business hours, please contact your provider's office and ask to speak with a nurse.       Please take the time to read instructions/literature along with the possible adverse reactions/side effects for all the Medicines that have been prescribed to you. Only take newly prescribed  Medications after you have completely understood and accept all the possible adverse reactions/side effects.   Do not take more than prescribed Medications for  Pain, Sleep and Anxiety. Do not drive when taking Pain medications or sleep aid/ insomnia  medications It is not advisable to combine anxiety, sleep and pain medications without talking with your primary care practitioner    If you are experiencing a Mental Health or  Behavioral Health Crisis or need someone to talk to Please call the Suicide and Crisis Lifeline: 988 You may also call the [place] National Suicide Prevention Lifeline: 970-629-5602 or TTY: 413 188 9139 TTY 204 687 6350) to talk to a trained counselor.  You may call the Behavioral Health Crisis Line at 251 805 3934, at any time, 24 hours a day, 7 days a week- however If you are in danger or need immediate medical attention, call 911.   If you would like help to quit smoking, call 1-800-QUIT-NOW ( (760)464-2010) OR Espaol: 1-855-Djelo-Ya (7-253-664-4034) o para ms informacin haga clic aqu or Text READY to 742-595 to register via text.   Susa Loffler , BSN, RN [place]   [facility]Population  Health RN Care Manager Direct Dial [phone]  Website: [email or web address]

## 2023-09-07 NOTE — Patient Outreach (Signed)
Care Management  Transitions of Care Program Transitions of Care Post-discharge week 4   09/07/2023 Name: ELI ADAMI MRN: [id number] DOB: 02-Oct-1960  Subjective: [name] is a 63 y.o. year old female who is a primary care patient of Benita Stabile, MD. The Care Management team Engaged with patient Engaged with patient by telephone to assess and address transitions of care needs.   Consent to Services:  Patient was given information about care management services, agreed to services, and gave verbal consent to participate.   Assessment:   Patient/ Caregiver  voices no new complaints or concerns  and has not developed/ reported any new medical issues / Dx or acute changes. - since last follow-up call for most recent  Hospital stay     08/10/23-1/2 / 2025 She is doing very well She is able to afford all her medications. She is planning om following up with [employer] regarding a portable concentrator. She is grateful for the additional medication savings  Medication reconciliation / review completed based on most recent discharge summary and EHR medication list. Confirmed patient is taking all newly prescribed medications as instructed (any discrepancies are noted in review section)   Patient is aware of any changes to and / or  any dosage adjustments to medication regimen. Patient denies questions at this time and reports no barriers to medication adherence  Patient educated on red flag s/s to watch for and was encouraged to report, any changes in baseline or  medication regimen,  changes in health status  /  well-being, safety concerns  or any new unmanaged side effects or symptoms not relieved with interventions  to PCP and / or the  [facility] Case Management team           SDOH Interventions    Flowsheet Row Telephone from 08/14/2023 in [place] POPULATION HEALTH DEPARTMENT  SDOH Interventions   Food Insecurity Interventions Intervention Not Indicated  Housing Interventions Intervention  Not Indicated  Transportation Interventions Intervention Not Indicated, Patient Resources (Friends/Family)  Utilities Interventions Intervention Not Indicated  Social Connections Interventions Intervention Not Indicated        Goals Addressed             This Visit's Progress    COMPLETED: TOC Care Plan       Current Barriers:  Chronic Disease Management support and education needs related to COPD   RNCM Clinical Goal(s):  Patient will take all medications exactly as prescribed and will call provider for medication related questions as evidenced by no missed medication doses  attend all scheduled medical appointments: with PCP (08/21/23) and Pulmonologist  ( 08/25/23)- completed no changes  as evidenced by no missed appointment  continue to work with RN Care Manager to address care management and care coordination needs related to  COPD as evidenced by adherence to CM Team Scheduled appointments through collaboration with RN Care manager, provider, and care team.   Interventions: Evaluation of current treatment plan related to  self management and patient's adherence to plan as established by provider  Transitions of Care:  Goal Met. Doctor Visits  - discussed the importance of doctor visits Post discharge activity limitations prescribed by provider reviewed Reviewed Signs and symptoms of infection  COPD Interventions:  (Status:  Goal Met.) Short Term Goal Advised patient to track and manage COPD triggers Provided instruction about proper use of medications used for management of COPD including inhalers Advised patient to self assesses COPD action plan zone and make appointment  with provider if in the yellow zone for 48 hours without improvement Advised patient to engage in light exercise as tolerated 3-5 days a week to aid in the the management of COPD Provided education about and advised patient to utilize infection prevention strategies to reduce risk of respiratory  infection Discussed the importance of adequate rest and management of fatigue with COPD Discussed Pulmonary Rehab and offered to assist with referral placement Assessed social determinant of health barriers  Patient Goals/Self-Care Activities: Participate in Transition of Care Program/Attend TOC scheduled calls Take all medications as prescribed Attend all scheduled provider appointments Call pharmacy for medication refills 3-7 days in advance of running out of medications Perform all self care activities independently  Perform IADL's (shopping, preparing meals, housekeeping, managing finances) independently Call provider office for new concerns or questions  identify and avoid work-related triggers identify and remove indoor air pollutants limit outdoor activity during cold weather do breathing exercises every day attend pulmonary rehabilitation eliminate symptom triggers at home keep follow-up appointments: with PCP and Pulmonology use an extra pillow to sleep eat healthy/prescribed diet: Heart Healthy, Low Fat Carb Modified  use devices that will help like a cane, sock-puller or reacher practice relaxation or meditation daily do breathing exercises every day do exercises in a comfortable position that makes breathing as easy as possible  Follow Up Plan:   Patient  verbalizes understanding of instructions and care plan provided. Patient was encouraged to make informed decisions about their care, actively participate in managing their health condition, and implement lifestyle changes as needed to promote independence and self-management of health care The patient has been provided with contact information for the care management team and has been advised to call with any health-related questions or concerns.        Plan:   The patient has successfully completed the [facility] Program. Condition is stable No further acute needs identified at this time. Chronic conditions and ongoing care  is  managed thru collaboration with  PCP,  Specialists and additional Healthcare Providers if indicated . Patient verbalized understanding of ongoing plan of care.  SDOH needs have been screened and interventions provided if identified.  Reviewed current home medications -- provided education as needed.  Previously discussed rationale of use, how/when to take medications. Patient is aware of potential side effects, and was encouraged to notify PCP for any changes in condition or signs / symptoms not relieved  with interventions.  Patient had call with Pharmacy with assisted in enrolling patient in Coupon saving plan and copay went from $150.00 to $0.00 x 12 months .  Patient will call 911 for Medical Emergencies or Life -Threatening or report to a local emergency department or urgent care.   Patient was encouraged to Contact PCP  with any questions or concerns regarding ongoing  medical care, any  difficulty obtaining or picking up  prescriptions, any  changes or  worsening in  condition including signs / symptoms not relieved  with interventions Follow up as indicated with the  Care Team , or sooner should any new problems arise.   Patient had no additional questions or concerns at this time. Current needs addressed.    The patient has been provided with contact information for the care management team and has been advised to call with any health related questions or concerns.   Susa Loffler , BSN, RN [facility]   [facility]-Population Health RN Care Manager Direct Dial [phone]  Fax: [phone] Website: [email or web address]

## 2023-09-18 ENCOUNTER — Ambulatory Visit
Admission: RE | Admit: 2023-09-18 | Discharge: 2023-09-18 | Disposition: A | Discharge status: Home / Self Care | Payer: BC Managed Care – PPO | Source: Ambulatory Visit | Attending: Pulmonary Disease | Admitting: Pulmonary Disease

## 2023-09-18 DIAGNOSIS — M8588 Other specified disorders of bone density and structure, other site: Secondary | ICD-10-CM | POA: Diagnosis not present

## 2023-09-18 DIAGNOSIS — J9601 Acute respiratory failure with hypoxia: Secondary | ICD-10-CM | POA: Diagnosis not present

## 2023-09-18 DIAGNOSIS — R911 Solitary pulmonary nodule: Secondary | ICD-10-CM

## 2023-09-18 DIAGNOSIS — R7303 Prediabetes: Secondary | ICD-10-CM | POA: Diagnosis not present

## 2023-09-18 DIAGNOSIS — Z720 Tobacco use: Secondary | ICD-10-CM

## 2023-09-18 DIAGNOSIS — J439 Emphysema, unspecified: Secondary | ICD-10-CM | POA: Diagnosis not present

## 2023-09-18 DIAGNOSIS — J9809 Other diseases of bronchus, not elsewhere classified: Secondary | ICD-10-CM | POA: Diagnosis not present

## 2023-09-18 DIAGNOSIS — I10 Essential (primary) hypertension: Secondary | ICD-10-CM | POA: Diagnosis not present

## 2023-09-24 ENCOUNTER — Encounter (HOSPITAL_COMMUNITY): Payer: Self-pay | Admitting: *Deleted

## 2023-09-24 ENCOUNTER — Emergency Department (HOSPITAL_COMMUNITY): Payer: BC Managed Care – PPO

## 2023-09-24 ENCOUNTER — Inpatient Hospital Stay (HOSPITAL_COMMUNITY)
Admission: EM | Admit: 2023-09-24 | Discharge: 2023-10-06 | DRG: 208 | Disposition: A | Discharge status: Home / Self Care | Payer: BC Managed Care – PPO | Attending: Internal Medicine | Admitting: Internal Medicine

## 2023-09-24 ENCOUNTER — Other Ambulatory Visit: Payer: Self-pay

## 2023-09-24 DIAGNOSIS — J449 Chronic obstructive pulmonary disease, unspecified: Secondary | ICD-10-CM | POA: Diagnosis not present

## 2023-09-24 DIAGNOSIS — J440 Chronic obstructive pulmonary disease with acute lower respiratory infection: Secondary | ICD-10-CM | POA: Diagnosis present

## 2023-09-24 DIAGNOSIS — R0602 Shortness of breath: Secondary | ICD-10-CM | POA: Diagnosis not present

## 2023-09-24 DIAGNOSIS — Z79899 Other long term (current) drug therapy: Secondary | ICD-10-CM

## 2023-09-24 DIAGNOSIS — Z4682 Encounter for fitting and adjustment of non-vascular catheter: Secondary | ICD-10-CM | POA: Diagnosis not present

## 2023-09-24 DIAGNOSIS — J09X2 Influenza due to identified novel influenza A virus with other respiratory manifestations: Secondary | ICD-10-CM | POA: Diagnosis not present

## 2023-09-24 DIAGNOSIS — L89151 Pressure ulcer of sacral region, stage 1: Secondary | ICD-10-CM | POA: Diagnosis not present

## 2023-09-24 DIAGNOSIS — J9621 Acute and chronic respiratory failure with hypoxia: Secondary | ICD-10-CM | POA: Diagnosis not present

## 2023-09-24 DIAGNOSIS — L8990 Pressure ulcer of unspecified site, unspecified stage: Secondary | ICD-10-CM | POA: Diagnosis present

## 2023-09-24 DIAGNOSIS — I4891 Unspecified atrial fibrillation: Secondary | ICD-10-CM | POA: Diagnosis present

## 2023-09-24 DIAGNOSIS — Z7982 Long term (current) use of aspirin: Secondary | ICD-10-CM | POA: Diagnosis not present

## 2023-09-24 DIAGNOSIS — F419 Anxiety disorder, unspecified: Secondary | ICD-10-CM | POA: Diagnosis present

## 2023-09-24 DIAGNOSIS — Z452 Encounter for adjustment and management of vascular access device: Secondary | ICD-10-CM | POA: Diagnosis not present

## 2023-09-24 DIAGNOSIS — F17210 Nicotine dependence, cigarettes, uncomplicated: Secondary | ICD-10-CM | POA: Diagnosis present

## 2023-09-24 DIAGNOSIS — D72819 Decreased white blood cell count, unspecified: Secondary | ICD-10-CM | POA: Diagnosis not present

## 2023-09-24 DIAGNOSIS — J09X1 Influenza due to identified novel influenza A virus with pneumonia: Secondary | ICD-10-CM | POA: Diagnosis not present

## 2023-09-24 DIAGNOSIS — D696 Thrombocytopenia, unspecified: Secondary | ICD-10-CM | POA: Diagnosis not present

## 2023-09-24 DIAGNOSIS — R0600 Dyspnea, unspecified: Secondary | ICD-10-CM | POA: Diagnosis not present

## 2023-09-24 DIAGNOSIS — E873 Alkalosis: Secondary | ICD-10-CM | POA: Diagnosis present

## 2023-09-24 DIAGNOSIS — J101 Influenza due to other identified influenza virus with other respiratory manifestations: Secondary | ICD-10-CM | POA: Diagnosis present

## 2023-09-24 DIAGNOSIS — R000 Tachycardia, unspecified: Secondary | ICD-10-CM | POA: Diagnosis present

## 2023-09-24 DIAGNOSIS — E876 Hypokalemia: Secondary | ICD-10-CM | POA: Diagnosis present

## 2023-09-24 DIAGNOSIS — J439 Emphysema, unspecified: Secondary | ICD-10-CM | POA: Diagnosis not present

## 2023-09-24 DIAGNOSIS — Z6821 Body mass index (BMI) 21.0-21.9, adult: Secondary | ICD-10-CM

## 2023-09-24 DIAGNOSIS — Z952 Presence of prosthetic heart valve: Secondary | ICD-10-CM

## 2023-09-24 DIAGNOSIS — R5381 Other malaise: Secondary | ICD-10-CM | POA: Diagnosis present

## 2023-09-24 DIAGNOSIS — J9692 Respiratory failure, unspecified with hypercapnia: Principal | ICD-10-CM | POA: Diagnosis present

## 2023-09-24 DIAGNOSIS — J9811 Atelectasis: Secondary | ICD-10-CM | POA: Diagnosis not present

## 2023-09-24 DIAGNOSIS — R918 Other nonspecific abnormal finding of lung field: Secondary | ICD-10-CM | POA: Diagnosis not present

## 2023-09-24 DIAGNOSIS — J9622 Acute and chronic respiratory failure with hypercapnia: Secondary | ICD-10-CM | POA: Diagnosis not present

## 2023-09-24 DIAGNOSIS — I350 Nonrheumatic aortic (valve) stenosis: Secondary | ICD-10-CM

## 2023-09-24 DIAGNOSIS — F05 Delirium due to known physiological condition: Secondary | ICD-10-CM | POA: Diagnosis not present

## 2023-09-24 DIAGNOSIS — R4182 Altered mental status, unspecified: Secondary | ICD-10-CM | POA: Diagnosis not present

## 2023-09-24 DIAGNOSIS — Z8249 Family history of ischemic heart disease and other diseases of the circulatory system: Secondary | ICD-10-CM

## 2023-09-24 DIAGNOSIS — J441 Chronic obstructive pulmonary disease with (acute) exacerbation: Principal | ICD-10-CM | POA: Diagnosis present

## 2023-09-24 DIAGNOSIS — J9690 Respiratory failure, unspecified, unspecified whether with hypoxia or hypercapnia: Secondary | ICD-10-CM | POA: Diagnosis not present

## 2023-09-24 DIAGNOSIS — J9602 Acute respiratory failure with hypercapnia: Secondary | ICD-10-CM

## 2023-09-24 DIAGNOSIS — I10 Essential (primary) hypertension: Secondary | ICD-10-CM | POA: Diagnosis present

## 2023-09-24 DIAGNOSIS — E43 Unspecified severe protein-calorie malnutrition: Secondary | ICD-10-CM | POA: Diagnosis present

## 2023-09-24 DIAGNOSIS — Z9981 Dependence on supplemental oxygen: Secondary | ICD-10-CM | POA: Diagnosis not present

## 2023-09-24 DIAGNOSIS — I959 Hypotension, unspecified: Secondary | ICD-10-CM | POA: Diagnosis not present

## 2023-09-24 DIAGNOSIS — Q2381 Bicuspid aortic valve: Secondary | ICD-10-CM | POA: Diagnosis not present

## 2023-09-24 DIAGNOSIS — T380X5A Adverse effect of glucocorticoids and synthetic analogues, initial encounter: Secondary | ICD-10-CM | POA: Diagnosis not present

## 2023-09-24 DIAGNOSIS — E162 Hypoglycemia, unspecified: Secondary | ICD-10-CM | POA: Diagnosis not present

## 2023-09-24 DIAGNOSIS — Z953 Presence of xenogenic heart valve: Secondary | ICD-10-CM

## 2023-09-24 DIAGNOSIS — I700 Atherosclerosis of aorta: Secondary | ICD-10-CM | POA: Diagnosis not present

## 2023-09-24 DIAGNOSIS — J9601 Acute respiratory failure with hypoxia: Secondary | ICD-10-CM | POA: Diagnosis present

## 2023-09-24 DIAGNOSIS — F17200 Nicotine dependence, unspecified, uncomplicated: Secondary | ICD-10-CM | POA: Diagnosis present

## 2023-09-24 DIAGNOSIS — R079 Chest pain, unspecified: Secondary | ICD-10-CM | POA: Diagnosis not present

## 2023-09-24 LAB — CBC
HCT: 47.9 % — ABNORMAL HIGH (ref 36.0–46.0)
Hemoglobin: 15.1 g/dL — ABNORMAL HIGH (ref 12.0–15.0)
MCH: 31.6 pg (ref 26.0–34.0)
MCHC: 31.5 g/dL (ref 30.0–36.0)
MCV: 100.2 fL — ABNORMAL HIGH (ref 80.0–100.0)
Platelets: 84 10*3/uL — ABNORMAL LOW (ref 150–400)
RBC: 4.78 MIL/uL (ref 3.87–5.11)
RDW: 13.6 % (ref 11.5–15.5)
WBC: 3.5 10*3/uL — ABNORMAL LOW (ref 4.0–10.5)
nRBC: 0 % (ref 0.0–0.2)

## 2023-09-24 LAB — COMPREHENSIVE METABOLIC PANEL
ALT: 14 U/L (ref 0–44)
AST: 19 U/L (ref 15–41)
Albumin: 3.3 g/dL — ABNORMAL LOW (ref 3.5–5.0)
Alkaline Phosphatase: 45 U/L (ref 38–126)
Anion gap: 11 (ref 5–15)
BUN: 23 mg/dL (ref 8–23)
CO2: 41 mmol/L — ABNORMAL HIGH (ref 22–32)
Calcium: 9 mg/dL (ref 8.9–10.3)
Chloride: 91 mmol/L — ABNORMAL LOW (ref 98–111)
Creatinine, Ser: 0.4 mg/dL — ABNORMAL LOW (ref 0.44–1.00)
GFR, Estimated: >60 mL/min (ref >60)
Glucose, Bld: 123 mg/dL — ABNORMAL HIGH (ref 70–99)
Potassium: 4.1 mmol/L (ref 3.5–5.1)
Sodium: 143 mmol/L (ref 135–145)
Total Bilirubin: 0.9 mg/dL (ref 0.0–1.2)
Total Protein: 6.4 g/dL — ABNORMAL LOW (ref 6.5–8.1)

## 2023-09-24 LAB — BLOOD GAS, VENOUS
Acid-Base Excess: 20.8 mmol/L — ABNORMAL HIGH (ref 0.0–2.0)
Acid-Base Excess: 22.2 mmol/L — ABNORMAL HIGH (ref 0.0–2.0)
Bicarbonate: 53 mmol/L — ABNORMAL HIGH (ref 20.0–28.0)
Bicarbonate: 54.5 mmol/L — ABNORMAL HIGH (ref 20.0–28.0)
Drawn by: 8462
Drawn by: 67337
O2 Saturation: 91.7 %
O2 Saturation: 66.5 %
Patient temperature: 36.5
Patient temperature: 36.5
pCO2, Ven: 94 mm[Hg] (ref 44–60)
pCO2, Ven: 99 mm[Hg] (ref 44–60)
pH, Ven: 7.36 (ref 7.25–7.43)
pH, Ven: 7.35 (ref 7.25–7.43)
pO2, Ven: 56 mm[Hg] — ABNORMAL HIGH (ref 32–45)
pO2, Ven: 35 mm[Hg] (ref 32–45)

## 2023-09-24 LAB — LACTIC ACID, PLASMA
Lactic Acid, Venous: 1 mmol/L (ref 0.5–1.9)
Lactic Acid, Venous: 1.2 mmol/L (ref 0.5–1.9)

## 2023-09-24 LAB — D-DIMER, QUANTITATIVE: D-Dimer, Quant: 2.81 ug{FEU}/mL — ABNORMAL HIGH (ref 0.00–0.50)

## 2023-09-24 LAB — BRAIN NATRIURETIC PEPTIDE: B Natriuretic Peptide: 50 pg/mL (ref 0.0–100.0)

## 2023-09-24 LAB — MAGNESIUM: Magnesium: 2 mg/dL (ref 1.7–2.4)

## 2023-09-24 LAB — TROPONIN I (HIGH SENSITIVITY)
Troponin I (High Sensitivity): 4 ng/L (ref <18)
Troponin I (High Sensitivity): 6 ng/L (ref <18)

## 2023-09-24 LAB — RESP PANEL BY RT-PCR (RSV, FLU A&B, COVID)  RVPGX2
Influenza A by PCR: POSITIVE — AB
Influenza B by PCR: NEGATIVE
Resp Syncytial Virus by PCR: NEGATIVE
SARS Coronavirus 2 by RT PCR: NEGATIVE

## 2023-09-24 LAB — PHOSPHORUS: Phosphorus: 2.8 mg/dL (ref 2.5–4.6)

## 2023-09-24 MED ORDER — DOCUSATE SODIUM 100 MG PO CAPS: 100.0000 mg | ORAL_CAPSULE | Freq: Two times a day (BID) | ORAL | Status: DC | PRN

## 2023-09-24 MED ORDER — OSELTAMIVIR PHOSPHATE 6 MG/ML PO SUSR
75.0000 mg | Freq: Two times a day (BID) | ORAL | Status: DC
  Administered 2023-09-25 – 2023-09-26 (×3): 75 mg
  Filled 2023-09-24 (×9): qty 12.5

## 2023-09-24 MED ORDER — METHYLPREDNISOLONE SODIUM SUCC 125 MG IJ SOLR
125.0000 mg | Freq: Once | INTRAMUSCULAR | Status: AC
  Administered 2023-09-24: 125 mg via INTRAVENOUS
  Filled 2023-09-24: qty 2

## 2023-09-24 MED ORDER — PANTOPRAZOLE SODIUM 40 MG IV SOLR
40.0000 mg | Freq: Every day | INTRAVENOUS | Status: DC
  Administered 2023-09-25 – 2023-09-27 (×4): 40 mg via INTRAVENOUS
  Filled 2023-09-24 (×4): qty 10

## 2023-09-24 MED ORDER — PROPOFOL 1000 MG/100ML IV EMUL
5.0000 ug/kg/min | INTRAVENOUS | Status: DC
  Administered 2023-09-25: 40 ug/kg/min via INTRAVENOUS
  Filled 2023-09-24: qty 200

## 2023-09-24 MED ORDER — METHYLPREDNISOLONE SODIUM SUCC 40 MG IJ SOLR
40.0000 mg | Freq: Two times a day (BID) | INTRAMUSCULAR | Status: DC
  Administered 2023-09-25 – 2023-09-27 (×5): 40 mg via INTRAVENOUS
  Filled 2023-09-24 (×5): qty 1

## 2023-09-24 MED ORDER — KETAMINE HCL 50 MG/5ML IJ SOSY
PREFILLED_SYRINGE | INTRAMUSCULAR | Status: AC
  Filled 2023-09-24: qty 5

## 2023-09-24 MED ORDER — INSULIN ASPART 100 UNIT/ML IJ SOLN
0.0000 [IU] | INTRAMUSCULAR | Status: DC
Start: 2023-09-25 — End: 2023-10-01
  Administered 2023-09-25: 2 [IU] via SUBCUTANEOUS
  Administered 2023-09-25: 1 [IU] via SUBCUTANEOUS
  Administered 2023-09-25: 2 [IU] via SUBCUTANEOUS
  Administered 2023-09-25: 3 [IU] via SUBCUTANEOUS
  Administered 2023-09-25: 1 [IU] via SUBCUTANEOUS
  Administered 2023-09-25: 2 [IU] via SUBCUTANEOUS
  Administered 2023-09-26: 1 [IU] via SUBCUTANEOUS
  Administered 2023-09-26: 2 [IU] via SUBCUTANEOUS
  Administered 2023-09-26 – 2023-09-27 (×3): 1 [IU] via SUBCUTANEOUS
  Administered 2023-09-27 (×2): 2 [IU] via SUBCUTANEOUS
  Administered 2023-09-27 – 2023-09-28 (×2): 1 [IU] via SUBCUTANEOUS
  Administered 2023-09-28: 2 [IU] via SUBCUTANEOUS
  Administered 2023-09-30: 1 [IU] via SUBCUTANEOUS

## 2023-09-24 MED ORDER — POLYETHYLENE GLYCOL 3350 17 G PO PACK: 17.0000 g | PACK | Freq: Every day | ORAL | Status: DC | PRN

## 2023-09-24 MED ORDER — IPRATROPIUM BROMIDE 0.02 % IN SOLN
0.5000 mg | RESPIRATORY_TRACT | Status: AC
  Administered 2023-09-24: 0.5 mg via RESPIRATORY_TRACT
  Filled 2023-09-24: qty 2.5

## 2023-09-24 MED ORDER — SODIUM CHLORIDE 0.9 % IV SOLN
2.0000 g | Freq: Once | INTRAVENOUS | Status: AC
  Administered 2023-09-24: 2 g via INTRAVENOUS
  Filled 2023-09-24: qty 20

## 2023-09-24 MED ORDER — ALBUTEROL (5 MG/ML) CONTINUOUS INHALATION SOLN: 7.5000 mg/h | INHALATION_SOLUTION | Freq: Once | RESPIRATORY_TRACT | Status: DC

## 2023-09-24 MED ORDER — SODIUM CHLORIDE 0.9 % IV SOLN: Freq: Once | INTRAVENOUS | Status: AC

## 2023-09-24 MED ORDER — ALBUTEROL SULFATE (2.5 MG/3ML) 0.083% IN NEBU
10.0000 mg/h | INHALATION_SOLUTION | Freq: Once | RESPIRATORY_TRACT | Status: AC
  Administered 2023-09-24: 10 mg/h via RESPIRATORY_TRACT

## 2023-09-24 MED ORDER — ROCURONIUM BROMIDE 10 MG/ML (PF) SYRINGE
PREFILLED_SYRINGE | INTRAVENOUS | Status: AC
Start: 2023-09-24 — End: 2023-09-24
  Administered 2023-09-24: 100 mg via INTRAVENOUS
  Filled 2023-09-24: qty 10

## 2023-09-24 MED ORDER — PROPOFOL 1000 MG/100ML IV EMUL
INTRAVENOUS | Status: AC
  Administered 2023-09-24: 5 ug/kg/min via INTRAVENOUS
  Filled 2023-09-24: qty 100

## 2023-09-24 MED ORDER — FENTANYL 2500MCG IN NS 250ML (10MCG/ML) PREMIX INFUSION
0.0000 ug/h | INTRAVENOUS | Status: DC
  Administered 2023-09-24: 50 ug/h via INTRAVENOUS
  Filled 2023-09-24: qty 250

## 2023-09-24 MED ORDER — ROCURONIUM BROMIDE 10 MG/ML (PF) SYRINGE: 100.0000 mg | PREFILLED_SYRINGE | Freq: Once | INTRAVENOUS | Status: AC

## 2023-09-24 MED ORDER — SODIUM CHLORIDE 0.9 % IV SOLN
500.0000 mg | INTRAVENOUS | Status: DC
  Administered 2023-09-24 – 2023-09-26 (×2): 500 mg via INTRAVENOUS
  Filled 2023-09-24 (×2): qty 5

## 2023-09-24 MED ORDER — FENTANYL CITRATE (PF) 100 MCG/2ML IJ SOLN
100.0000 ug | INTRAMUSCULAR | Status: AC
  Administered 2023-09-24: 100 ug via INTRAVENOUS

## 2023-09-24 MED ORDER — IPRATROPIUM-ALBUTEROL 0.5-2.5 (3) MG/3ML IN SOLN
3.0000 mL | Freq: Once | RESPIRATORY_TRACT | Status: AC
  Administered 2023-09-24: 3 mL via RESPIRATORY_TRACT
  Filled 2023-09-24: qty 3

## 2023-09-24 MED ORDER — DOCUSATE SODIUM 50 MG/5ML PO LIQD: 100.0000 mg | Freq: Two times a day (BID) | ORAL | Status: DC | PRN

## 2023-09-24 MED ORDER — KETAMINE HCL 10 MG/ML IJ SOLN: 125.0000 mg | Freq: Once | INTRAMUSCULAR | Status: AC

## 2023-09-24 MED ORDER — ALBUTEROL SULFATE (2.5 MG/3ML) 0.083% IN NEBU: 10.0000 mg/h | INHALATION_SOLUTION | Freq: Once | RESPIRATORY_TRACT | Status: DC

## 2023-09-24 MED ORDER — KETAMINE HCL 50 MG/5ML IJ SOSY: 125.0000 mg | PREFILLED_SYRINGE | Freq: Once | INTRAMUSCULAR | Status: DC

## 2023-09-24 MED ORDER — POLYETHYLENE GLYCOL 3350 17 G PO PACK
17.0000 g | PACK | Freq: Every day | ORAL | Status: DC | PRN
  Administered 2023-09-25: 17 g
  Filled 2023-09-24: qty 1

## 2023-09-24 MED ORDER — HEPARIN SODIUM (PORCINE) 5000 UNIT/ML IJ SOLN
5000.0000 [IU] | Freq: Three times a day (TID) | INTRAMUSCULAR | Status: DC
  Administered 2023-09-25 – 2023-10-06 (×34): 5000 [IU] via SUBCUTANEOUS
  Filled 2023-09-24 (×34): qty 1

## 2023-09-24 MED ORDER — KETAMINE HCL 10 MG/ML IJ SOLN
INTRAMUSCULAR | Status: AC
  Administered 2023-09-24: 125 mg via INTRAVENOUS
  Filled 2023-09-24: qty 1

## 2023-09-24 MED ORDER — ALBUTEROL SULFATE (2.5 MG/3ML) 0.083% IN NEBU
5.0000 mg | INHALATION_SOLUTION | RESPIRATORY_TRACT | Status: AC
  Administered 2023-09-24: 5 mg via RESPIRATORY_TRACT
  Filled 2023-09-24: qty 6

## 2023-09-24 MED ORDER — ALBUTEROL SULFATE (2.5 MG/3ML) 0.083% IN NEBU
INHALATION_SOLUTION | RESPIRATORY_TRACT | Status: AC
  Filled 2023-09-24: qty 3

## 2023-09-24 MED ORDER — LEVALBUTEROL HCL 1.25 MG/0.5ML IN NEBU
INHALATION_SOLUTION | RESPIRATORY_TRACT | Status: AC
  Administered 2023-09-24: 10 mg
  Filled 2023-09-24: qty 4

## 2023-09-24 MED ORDER — IOHEXOL 350 MG/ML SOLN
75.0000 mL | Freq: Once | INTRAVENOUS | Status: AC | PRN
  Administered 2023-09-24: 75 mL via INTRAVENOUS

## 2023-09-24 NOTE — H&P (Signed)
NAME:  Crystal Silva, MRN:  [id number], DOB:  04-21-1961, LOS: 1 ADMISSION DATE:  09/24/2023 CONSULTATION DATE:  09/25/2023 REFERRING MD:  Eloise Harman - EDP CHIEF COMPLAINT:  Respiratory failure   History of Present Illness:  63 year old woman who presented to [facility] 2/14 as a transfer from [facility] ED for respiratory failure. PMHx significant for bicuspid AV with severe AS (s/p bioprosthetic AVR 2022), COPD (on 2L HOT), tobacco use, RUL pulmonary nodule.  Patient initially presented to [facility] ED with SOB/CP x 2 days. On arrival to ED, she was afebrile, HR 79, BP 161/81, tachypneic to 29-30, SpO2 96%. Reportedly became hypoxic with EMS with associated lethargy and transient bradycardia to 30s. Endorsed SOB/CP, denied other URI symptoms. +Sick contact (grandson). Labs were notable for WBC 3.5, Hgb 15.1, Plt 84 (136 one month PTA). Na 143, K 4.1, CO2 41, Cr 0.40. LFTs grossly WNL. Trop 4 > 6. BNP 50. VBG 7.36/pCO2 94/bicarb 53.0. D-dimer 2.81. LA 1.0. +Flu A positive. CTA Chest 2/13 negative for PE, +mild airspace with tree-in-bud nodular opacities RML, bilateral scattered pulmonary nodules up to 9mm (new from prior), emphysema.  Patient was initially tolerating BiPAP, but clinically decompensated from a respiratory standpoint prompting intubation. Intubated in [facility] ED.  PCCM consulted for ICU admission/transfer.  Pertinent Medical History:   Past Medical History:  Diagnosis Date   COPD (chronic obstructive pulmonary disease) (HCC)    Headache    Heart murmur    Significant Hospital Events: Including procedures, antibiotic start and stop dates in addition to other pertinent events   2/13 - Transferred from [facility] to [facility] for higher level of care. Flu A+. Intubated in ED. PCCM consulted for ICU admission.  Interim History / Subjective:  PCCM consulted for ICU admission and transfer.  Objective:  Blood pressure 99/69, pulse (!) 121, temperature (!) 97.1 F (36.2 C), resp. rate (!) 21, height 5\' 9"  (1.753 m),  weight 72.6 kg, last menstrual period 12/02/2013, SpO2 100%.    Vent Mode: PRVC FiO2 (%):  [40 %-60 %] 40 % Set Rate:  [20 bmp-22 bmp] 20 bmp Vt Set:  [520 mL] 520 mL PEEP:  [5 cmH20] 5 cmH20 Plateau Pressure:  [25 cmH20-27 cmH20] 25 cmH20   Intake/Output Summary (Last 24 hours) at 09/25/2023 0119 Last data filed at 09/25/2023 0022 Gross per 24 hour  Intake 1099.08 ml  Output 250 ml  Net 849.08 ml   Filed Weights   09/24/23 1753  Weight: 72.6 kg   Physical Examination: General: Acutely ill-appearing middle-aged woman in NAD. HEENT: [place]/AT, anicteric sclera, PERRL 2mm sluggish, moist mucous membranes. Neuro:  Lightly sedated.  Responds to verbal stimuli. Following commands intermittently. +Spontaneous movement of extremities. +Cough/gag. Intermittent shivering. CV: Tachycardic, irregular rhythm, no m/g/r. PULM: Breathing tachypneic and mildly labored on vent. Lung fields with scattered rhonchi, slight expiratory wheeze. GI: Soft, nontender, nondistended. Normoactive bowel sounds. Extremities: No LE edema noted. Skin: Cool dry, +petechial discoloration of bilateral feet with blanching.  Resolved Hospital Problem List:    Assessment & Plan:  Acute hypoxemic and hypercarbic respiratory failure in the setting of AECOPD/Flu A infection History of COPD on 2L HOT - Admit to ICU at [facility] - Continue full vent support (4-8cc/kg IBW) - Wean FiO2 for O2 sat > 90% - Daily WUA/SBT once appropriate from a vent requirement standpoint - Bronchodilators (home medications Trelegy daily, DuoNeb PRN) - VAP bundle - Pulmonary hygiene - PAD protocol for sedation: Propofol and Fentanyl for goal RASS 0 to -1  Influenza  A infection AECOPD - Trend WBC, fever curve, LA - F/u Cx data, PCT - CAP coverage with ceftriaxone/azithromycin - Tamiflu ordered (symptom onset window 48-72H) - Solumedrol 40mg  BID x 5-day course - Follow CXR, ABG  Bicuspid AV Severe AS s/p MVR 2022 (bioprosthetic) Previous  MVR with Dr. Dorris Fetch 2022. - Cardiac monitoring - Optimize electrolytes for K > 4, Mg > 2 - ASA only - Hold home metoprolol for now - F/u Echo  Tobacco use Pulmonary nodules CTA Chest demonstrating multiple bilateral pulmonary nodules measuring up to 9mm, increased from prior. - Outpatient follow up for lung cancer screening/nodule characterization - Encourage smoking cessation  Best Practice: (right click and "Reselect all SmartList Selections" daily)   Diet/type: NPO DVT prophylaxis: SCDs, SQH GI prophylaxis: PPI Lines: N/A Foley:  N/A Code Status:  full code Last date of multidisciplinary goals of care discussion [Pending]  Labs:  CBC: Recent Labs  Lab 09/24/23 1813 09/25/23 0114  WBC 3.5*  --   HGB 15.1* 14.6  HCT 47.9* 43.0  MCV 100.2*  --   PLT 84*  --    Basic Metabolic Panel: Recent Labs  Lab 09/24/23 1813 09/24/23 2251 09/25/23 0114  NA 143  --  142  K 4.1  --  2.9*  CL 91*  --   --   CO2 41*  --   --   GLUCOSE 123*  --   --   BUN 23  --   --   CREATININE 0.40*  --   --   CALCIUM 9.0  --   --   MG  --  2.0  --   PHOS  --  2.8  --    GFR: Estimated Creatinine Clearance: 77.2 mL/min (A) (by C-G formula based on SCr of 0.4 mg/dL (L)). Recent Labs  Lab 09/24/23 1813 09/24/23 2020 09/24/23 2251  WBC 3.5*  --   --   LATICACIDVEN  --  1.0 1.2   Liver Function Tests: Recent Labs  Lab 09/24/23 1813  AST 19  ALT 14  ALKPHOS 45  BILITOT 0.9  PROT 6.4*  ALBUMIN 3.3*   No results for input(s): "LIPASE", "AMYLASE" in the last 168 hours. No results for input(s): "AMMONIA" in the last 168 hours.  ABG:    Component Value Date/Time   PHART 7.333 (L) 09/25/2023 0114   PCO2ART 65.0 (H) 09/25/2023 0114   PO2ART 215 (H) 09/25/2023 0114   HCO3 34.8 (H) 09/25/2023 0114   TCO2 37 (H) 09/25/2023 0114   ACIDBASEDEF 3.0 (H) 05/22/2021 1752   O2SAT 100 09/25/2023 0114    Coagulation Profile: No results for input(s): "INR", "PROTIME" in the last  168 hours.  Cardiac Enzymes: No results for input(s): "CKTOTAL", "CKMB", "CKMBINDEX", "TROPONINI" in the last 168 hours.  HbA1C: Hgb A1c MFr Bld  Date/Time Value Ref Range Status  08/11/2023 12:35 PM 5.6 4.8 - 5.6 % Final    Comment:    (NOTE)         Prediabetes: 5.7 - 6.4         Diabetes: >6.4         Glycemic control for adults with diabetes: <7.0   05/20/2021 02:36 PM 5.6 4.8 - 5.6 % Final    Comment:    (NOTE) Pre diabetes:          5.7%-6.4%  Diabetes:              >6.4%  Glycemic control for   <7.0% adults with diabetes  CBG: Recent Labs  Lab 09/25/23 0051  GLUCAP 170*   Review of Systems:   Patient is encephalopathic and/or intubated; therefore, history has been obtained from chart review.   Past Medical History:  She,  has a past medical history of COPD (chronic obstructive pulmonary disease) (HCC), Headache, and Heart murmur.   Surgical History:   Past Surgical History:  Procedure Laterality Date   AORTIC VALVE REPLACEMENT N/A 05/22/2021   Procedure: AORTIC VALVE REPLACEMENT (AVR) WITH STERNAL PLATING. 23 MM INSPIRIS RESILIA  AORTIC VALVE.;  Surgeon: [name], MD;  Location: [facility] OR;  Service: Open Heart Surgery;  Laterality: N/A;   CHOLECYSTECTOMY     lt knee surgery     RIGHT/LEFT HEART CATH AND CORONARY ANGIOGRAPHY N/A 04/30/2021   Procedure: RIGHT/LEFT HEART CATH AND CORONARY ANGIOGRAPHY;  Surgeon: Marykay Lex, MD;  Location: [facility] INVASIVE CV LAB;  Service: Cardiovascular;  Laterality: N/A;   TEE WITHOUT CARDIOVERSION N/A 05/22/2021   Procedure: TRANSESOPHAGEAL ECHOCARDIOGRAM (TEE);  Surgeon: [name], MD;  Location: [facility] OR;  Service: Open Heart Surgery;  Laterality: N/A;   Social History:   reports that she has been smoking cigarettes. She has a 44 pack-year smoking history. She has never used smokeless tobacco. She reports that she does not currently use alcohol. She reports that she does not use drugs.   Family  History:  Her family history includes Heart disease in her mother; Heart failure in her mother.   Allergies: No Known Allergies   Home Medications: Prior to Admission medications   Medication Sig Start Date End Date Taking? Authorizing Provider  Albuterol-Budesonide (AIRSUPRA) 90-80 MCG/ACT AERO Inhale 1 puff into the lungs every 4 (four) hours as needed (wheezing, shortness of breath).    [provider]  aspirin EC 325 MG EC tablet Take 1 tablet (325 mg total) by mouth daily. 05/26/21   Gold, Wayne E, PA-C  ipratropium-albuterol (DUONEB) 0.5-2.5 (3) MG/3ML SOLN Take 3 mLs by nebulization every 4 (four) hours as needed (Shortness of breath/wheezing). 06/17/22   Glade [name], MD  metoprolol tartrate (LOPRESSOR) 25 MG tablet Take 1 tablet (25 mg total) by mouth 2 (two) times daily. 05/28/21 08/12/23  Netta Neat., NP  Multiple Vitamin (MULTIVITAMIN WITH MINERALS) TABS tablet Take 1 tablet by mouth daily.    [provider]  naproxen sodium (ALEVE) 220 MG tablet Take 220 mg by mouth daily as needed (headache).    [provider]  sertraline (ZOLOFT) 50 MG tablet Take 50 mg by mouth daily.    [provider]  Dwyane Luo 200-62.5-25 MCG/ACT AEPB Inhale 1 puff into the lungs daily. 04/13/23   [provider]    Critical care time:   The patient is critically ill with multiple organ system failure and requires high complexity decision making for assessment and support, frequent evaluation and titration of therapies, advanced monitoring, review of radiographic studies and interpretation of complex data.   Critical Care Time devoted to patient care services, exclusive of separately billable procedures, described in this note is 41 minutes.  Tim Lair, PA-C [facility] 09/25/23 1:19 AM  Please see [email or web address] for pager details.  From 7A-7P if no response, please call [phone] After hours, please call ELink  [phone]

## 2023-09-24 NOTE — ED Notes (Signed)
EDP and Respiratory at bedside.   Ketamine 125mg  @ 2225 ROC 100mg  @ 2226 Tubed @ 2227 Positiive color change @ 2228 22 @ Gums On vent @ 2231

## 2023-09-24 NOTE — ED Triage Notes (Signed)
Pt brought in by [facility] from home with c/o SOB and chest pain x 2 days. Pt denies chest pain currently. RR 29 upon arrival to ED. EMS reports diminished lung sounds and pt became very SOB with O2 sat dropping low (unknown O2 sat reading) upon movement. EMS also reports while en route pt became very lethargic and her HR dropped into the 30s. They reported she responded as soon as they called her name and her HR rose to WNL within 30 seconds. Pt reports being around her grandson who has been sick, with unknown diagnosis.

## 2023-09-24 NOTE — Progress Notes (Signed)
Patient placed on Bipap 16/8, BUR 22, 40% with sat of 98%.  Patient tolerating at this time.  Dentures removed from patient and placed in denture cup with label.

## 2023-09-24 NOTE — ED Provider Notes (Addendum)
[facility] EMERGENCY DEPARTMENT AT [facility] Provider Note   CSN: [id number] Arrival date & time: 09/24/23  1736     History  Chief Complaint  Patient presents with   Shortness of Breath   Chest Pain    Crystal Silva is a 63 y.o. female.  63 year old female with history of COPD on 2 L home oxygen and aortic valve replacement who presents emergency department shortness of breath.  Patient reports that over the past 2 to 3 days she has been feeling poorly.  Has been having shortness of breath.  Also has chest tightness that worsens with ambulation.  Does not radiate.  Substernal.  No diaphoresis or vomiting.  No runny nose, sore throat, or cough.  No fevers.  No leg swelling.  Does have the grandson at home that is sick.  Had a brief episode with EMS where she appeared to get bradycardic into the 30s that spontaneously resolved.       Home Medications Prior to Admission medications   Medication Sig Start Date End Date Taking? Authorizing Provider  Albuterol-Budesonide (AIRSUPRA) 90-80 MCG/ACT AERO Inhale 1 puff into the lungs every 4 (four) hours as needed (wheezing, shortness of breath).   Yes [provider]  furosemide (LASIX) 20 MG tablet Take 20 mg by mouth daily.   Yes [provider]  ipratropium-albuterol (DUONEB) 0.5-2.5 (3) MG/3ML SOLN Take 3 mLs by nebulization every 4 (four) hours as needed (Shortness of breath/wheezing). 06/17/22  Yes Glade [name], MD  metoprolol tartrate (LOPRESSOR) 25 MG tablet Take 1 tablet (25 mg total) by mouth 2 (two) times daily. 05/28/21 09/25/23 Yes Netta Neat., NP  sertraline (ZOLOFT) 50 MG tablet Take 50 mg by mouth daily.   Yes [provider]  Dwyane Luo 200-62.5-25 MCG/ACT AEPB Inhale 1 puff into the lungs daily. 04/13/23  Yes [provider]  aspirin EC 325 MG EC tablet Take 1 tablet (325 mg total) by mouth daily. 05/26/21   Rowe Clack, PA-C  Multiple Vitamin (MULTIVITAMIN WITH  MINERALS) TABS tablet Take 1 tablet by mouth daily.    [provider]  naproxen sodium (ALEVE) 220 MG tablet Take 220 mg by mouth daily as needed (headache).    [provider]      Allergies    Patient has no known allergies.    Review of Systems   Review of Systems  Physical Exam Updated Vital Signs BP 123/71 (BP Location: Right Arm)   Pulse 83   Temp 98.8 F (37.1 C) (Bladder)   Resp 15   Ht 5\' 9"  (1.753 m)   Wt 71.3 kg   LMP 12/02/2013   SpO2 97%   BMI 23.21 kg/m  Physical Exam Vitals and nursing note reviewed.  Constitutional:      General: She is not in acute distress.    Appearance: She is well-developed.  HENT:     Head: Normocephalic and atraumatic.     Right Ear: External ear normal.     Left Ear: External ear normal.     Nose: Nose normal.  Eyes:     Extraocular Movements: Extraocular movements intact.     Conjunctiva/sclera: Conjunctivae normal.     Pupils: Pupils are equal, round, and reactive to light.  Cardiovascular:     Rate and Rhythm: Normal rate and regular rhythm.     Heart sounds: No murmur heard. Pulmonary:     Effort: Pulmonary effort is normal. No respiratory distress.  Breath sounds: Wheezing present.     Comments: On 2 L nasal cannula Musculoskeletal:     Cervical back: Normal range of motion and neck supple.     Right lower leg: No edema.     Left lower leg: No edema.  Skin:    General: Skin is warm and dry.  Neurological:     Mental Status: She is alert and oriented to person, place, and time. Mental status is at baseline.  Psychiatric:        Mood and Affect: Mood normal.     ED Results / Procedures / Treatments   Labs (all labs ordered are listed, but only abnormal results are displayed) Labs Reviewed  RESP PANEL BY RT-PCR (RSV, FLU A&B, COVID)  RVPGX2 - Abnormal; Notable for the following components:      Result Value   Influenza A by PCR POSITIVE (*)    All other components within normal limits   RESPIRATORY PANEL BY PCR - Abnormal; Notable for the following components:   Influenza A DETECTED (*)    All other components within normal limits  COMPREHENSIVE METABOLIC PANEL - Abnormal; Notable for the following components:   Chloride 91 (*)    CO2 41 (*)    Glucose, Bld 123 (*)    Creatinine, Ser 0.40 (*)    Total Protein 6.4 (*)    Albumin 3.3 (*)    All other components within normal limits  CBC - Abnormal; Notable for the following components:   WBC 3.5 (*)    Hemoglobin 15.1 (*)    HCT 47.9 (*)    MCV 100.2 (*)    Platelets 84 (*)    All other components within normal limits  BLOOD GAS, VENOUS - Abnormal; Notable for the following components:   pCO2, Ven 94 (*)    pO2, Ven 56 (*)    Bicarbonate 53.0 (*)    Acid-Base Excess 20.8 (*)    All other components within normal limits  D-DIMER, QUANTITATIVE - Abnormal; Notable for the following components:   D-Dimer, Quant 2.81 (*)    All other components within normal limits  BLOOD GAS, VENOUS - Abnormal; Notable for the following components:   pCO2, Ven 99 (*)    Bicarbonate 54.5 (*)    Acid-Base Excess 22.2 (*)    All other components within normal limits  GLUCOSE, CAPILLARY - Abnormal; Notable for the following components:   Glucose-Capillary 170 (*)    All other components within normal limits  CBC WITH DIFFERENTIAL/PLATELET - Abnormal; Notable for the following components:   WBC 3.6 (*)    Platelets 79 (*)    Lymphs Abs 0.4 (*)    All other components within normal limits  BASIC METABOLIC PANEL - Abnormal; Notable for the following components:   Potassium 3.0 (*)    Chloride 96 (*)    Glucose, Bld 235 (*)    Calcium 8.6 (*)    Anion gap 17 (*)    All other components within normal limits  GLUCOSE, CAPILLARY - Abnormal; Notable for the following components:   Glucose-Capillary 231 (*)    All other components within normal limits  GLUCOSE, CAPILLARY - Abnormal; Notable for the following components:    Glucose-Capillary 161 (*)    All other components within normal limits  GLUCOSE, CAPILLARY - Abnormal; Notable for the following components:   Glucose-Capillary 132 (*)    All other components within normal limits  GLUCOSE, CAPILLARY - Abnormal; Notable for the following  components:   Glucose-Capillary 155 (*)    All other components within normal limits  GLUCOSE, CAPILLARY - Abnormal; Notable for the following components:   Glucose-Capillary 129 (*)    All other components within normal limits  GLUCOSE, CAPILLARY - Abnormal; Notable for the following components:   Glucose-Capillary 137 (*)    All other components within normal limits  POCT I-STAT 7, (LYTES, BLD GAS, ICA,H+H) - Abnormal; Notable for the following components:   pH, Arterial 7.333 (*)    pCO2 arterial 65.0 (*)    pO2, Arterial 215 (*)    Bicarbonate 34.8 (*)    TCO2 37 (*)    Acid-Base Excess 6.0 (*)    Potassium 2.9 (*)    Calcium, Ion 1.11 (*)    All other components within normal limits  POCT I-STAT 7, (LYTES, BLD GAS, ICA,H+H) - Abnormal; Notable for the following components:   pCO2 arterial 50.4 (*)    Bicarbonate 33.5 (*)    TCO2 35 (*)    Acid-Base Excess 8.0 (*)    Potassium 2.9 (*)    All other components within normal limits  POCT I-STAT 7, (LYTES, BLD GAS, ICA,H+H) - Abnormal; Notable for the following components:   pCO2 arterial 58.8 (*)    Bicarbonate 38.7 (*)    TCO2 40 (*)    Acid-Base Excess 12.0 (*)    All other components within normal limits  CULTURE, BLOOD (ROUTINE X 2)  CULTURE, BLOOD (ROUTINE X 2)  CULTURE, RESPIRATORY W GRAM STAIN  MRSA NEXT GEN BY PCR, NASAL  BRAIN NATRIURETIC PEPTIDE  LACTIC ACID, PLASMA  LACTIC ACID, PLASMA  MAGNESIUM  PHOSPHORUS  CBC  BASIC METABOLIC PANEL  MAGNESIUM  TROPONIN I (HIGH SENSITIVITY)  TROPONIN I (HIGH SENSITIVITY)    EKG EKG Interpretation Date/Time:  Thursday September 24 2023 17:48:34 EST Ventricular Rate:  73 PR Interval:  136 QRS  Duration:  82 QT Interval:  396 QTC Calculation: 437 R Axis:   3  Text Interpretation: Sinus rhythm Low voltage, precordial leads Minimal ST depression, lateral leads Confirmed by Vonita Moss [phone]) on 09/24/2023 7:43:43 PM  Radiology ECHOCARDIOGRAM COMPLETE Result Date: 09/25/2023    ECHOCARDIOGRAM REPORT   Patient Name:   Crystal Silva Date of Exam: 09/25/2023 Medical Rec #:  [id number]     Height:       69.0 in Accession #:    [id number]    Weight:       157.2 lb Date of Birth:  Feb 02, 1961    BSA:          1.865 m Patient Age:    62 years      BP:           106/79 mmHg Patient Gender: F             HR:           108 bpm. Exam Location:  Inpatient Procedure: 2D Echo, Cardiac Doppler and Color Doppler (Both Spectral and Color            Flow Doppler were utilized during procedure). Indications:    S/P Aortic Valve replacement Z95.2  History:        Patient has prior history of Echocardiogram examinations, most                 recent 06/19/2023. COPD, Aortic Valve Disease,                 Signs/Symptoms:Chest Pain and Shortness of  Breath; Risk                 Factors:Hypertension.                 Aortic Valve: 23 mm Inspiris Resilia valve is present in the                 aortic position.  Sonographer:    Webb Laws Referring Phys: [id number] STEPHANIE M REESE  Sonographer Comments: Technically difficult study due to poor echo windows. IMPRESSIONS  1. Mild intracavitary gradient. Peak velocity 1.46 m/s. Peak gradient 8.5 mmHg. Left ventricular ejection fraction, by estimation, is 65 to 70%. The left ventricle has normal function. The left ventricle has no regional wall motion abnormalities. There is mild left ventricular hypertrophy of the septal segment. Left ventricular diastolic parameters are consistent with Grade I diastolic dysfunction (impaired relaxation).  2. Right ventricular systolic function is normal. The right ventricular size is normal. There is moderately elevated pulmonary artery  systolic pressure.  3. The mitral valve is normal in structure. No evidence of mitral valve regurgitation. No evidence of mitral stenosis.  4. Tricuspid valve regurgitation is mild to moderate.  5. The aortic valve has been repaired/replaced. Aortic valve regurgitation is not visualized. No aortic stenosis is present. There is a 23 mm Inspiris Resilia valve present in the aortic position.  6. The inferior vena cava is dilated in size with <50% respiratory variability, suggesting right atrial pressure of 15 mmHg. FINDINGS  Left Ventricle: Mild intracavitary gradient. Peak velocity 1.46 m/s. Peak gradient 8.5 mmHg. Left ventricular ejection fraction, by estimation, is 65 to 70%. The left ventricle has normal function. The left ventricle has no regional wall motion abnormalities. Strain imaging was not performed. The left ventricular internal cavity size was normal in size. There is mild left ventricular hypertrophy of the septal segment. Left ventricular diastolic parameters are consistent with Grade I diastolic dysfunction (impaired relaxation). Right Ventricle: The right ventricular size is normal. No increase in right ventricular wall thickness. Right ventricular systolic function is normal. There is moderately elevated pulmonary artery systolic pressure. The tricuspid regurgitant velocity is 2.78 m/s, and with an assumed right atrial pressure of 15 mmHg, the estimated right ventricular systolic pressure is 45.9 mmHg. Left Atrium: Left atrial size was normal in size. Right Atrium: Right atrial size was normal in size. Pericardium: There is no evidence of pericardial effusion. Mitral Valve: The mitral valve is normal in structure. No evidence of mitral valve regurgitation. No evidence of mitral valve stenosis. Tricuspid Valve: The tricuspid valve is normal in structure. Tricuspid valve regurgitation is mild to moderate. No evidence of tricuspid stenosis. Aortic Valve: The aortic valve has been repaired/replaced.  Aortic valve regurgitation is not visualized. No aortic stenosis is present. Aortic valve mean gradient measures 7.0 mmHg. Aortic valve peak gradient measures 12.3 mmHg. Aortic valve area, by VTI  measures 1.30 cm. There is a 23 mm Inspiris Resilia valve present in the aortic position. Pulmonic Valve: The pulmonic valve was normal in structure. Pulmonic valve regurgitation is not visualized. No evidence of pulmonic stenosis. Aorta: The aortic root is normal in size and structure. Venous: The inferior vena cava is dilated in size with less than 50% respiratory variability, suggesting right atrial pressure of 15 mmHg. IAS/Shunts: No atrial level shunt detected by color flow Doppler. Additional Comments: 3D imaging was not performed.  LEFT VENTRICLE PLAX 2D LVIDd:         3.60 cm   Diastology  LVIDs:         2.70 cm   LV e' medial:    6.20 cm/s LV PW:         0.90 cm   LV E/e' medial:  9.0 LV IVS:        1.10 cm   LV e' lateral:   3.98 cm/s LVOT diam:     1.70 cm   LV E/e' lateral: 14.1 LV SV:         36 LV SV Index:   19 LVOT Area:     2.27 cm  RIGHT VENTRICLE            IVC RV Basal diam:  3.60 cm    IVC diam: 2.50 cm RV S prime:     7.58 cm/s TAPSE (M-mode): 1.6 cm LEFT ATRIUM           Index        RIGHT ATRIUM          Index LA diam:      1.90 cm 1.02 cm/m   RA Area:     6.55 cm LA Vol (A2C): 23.4 ml 12.55 ml/m  RA Volume:   11.10 ml 5.95 ml/m LA Vol (A4C): 13.2 ml 7.08 ml/m  AORTIC VALVE AV Area (Vmax):    1.62 cm AV Area (Vmean):   1.29 cm AV Area (VTI):     1.30 cm AV Vmax:           175.50 cm/s AV Vmean:          125.000 cm/s AV VTI:            0.275 m AV Peak Grad:      12.3 mmHg AV Mean Grad:      7.0 mmHg LVOT Vmax:         125.00 cm/s LVOT Vmean:        71.200 cm/s LVOT VTI:          0.157 m LVOT/AV VTI ratio: 0.57 MITRAL VALVE               TRICUSPID VALVE MV Area (PHT): 6.35 cm    TR Peak grad:   30.9 mmHg MV Decel Time: 120 msec    TR Vmax:        278.00 cm/s MV E velocity: 56.10 cm/s MV A  velocity: 77.95 cm/s  SHUNTS MV E/A ratio:  0.72        Systemic VTI:  0.16 m                            Systemic Diam: 1.70 cm Chilton Si MD Electronically signed by Chilton Si MD Signature Date/Time: 09/25/2023/11:49:06 AM    Final    DG Chest Port 1 View Result Date: 09/25/2023 CLINICAL DATA:  Intubation.  Flu a EXAM: PORTABLE CHEST 1 VIEW COMPARISON:  Yesterday FINDINGS: Endotracheal tube with tip between the clavicular heads and carina. An enteric tube at least reaches the stomach. Artifact from EKG leads. Hyperinflation and pulmonary lucency from COPD. There is no edema, consolidation, effusion, or pneumothorax. IMPRESSION: Unremarkable hardware positioning. Stable aeration with generalized findings of COPD. Electronically Signed   By: Tiburcio Pea M.D.   On: 09/25/2023 07:45   DG Abd Portable 1 View Result Date: 09/24/2023 CLINICAL DATA:  Orogastric tube placement EXAM: PORTABLE ABDOMEN - 1 VIEW COMPARISON:  None Available. FINDINGS: Nasogastric tube tip overlies the proximal body of the stomach.  The abdominal gas pattern is indeterminate due to a paucity of intra-abdominal gas. Right flank and pelvis excluded from view. IMPRESSION: 1. Nasogastric tube tip within the proximal body of the stomach. Electronically Signed   By: Helyn Numbers M.D.   On: 09/24/2023 23:02   DG Chest Portable 1 View Result Date: 09/24/2023 CLINICAL DATA:  Respiratory failure EXAM: PORTABLE CHEST 1 VIEW COMPARISON:  08/11/2023 FINDINGS: Endotracheal tube is seen 16 mm above the carina. Nasogastric tube extends into the upper abdomen beyond the margin of the examination. The lungs are hyper there is coarsening of pulmonary interstitium in keeping with changes of underlying emphysema. No confluent pulmonary infiltrate. No pneumothorax or pleural effusion. Aortic replacement has been performed. Cardiac size within normal limits. No acute bone abnormality. IMPRESSION: 1. Support tubes in appropriate position. 2.  Emphysema. Electronically Signed   By: Helyn Numbers M.D.   On: 09/24/2023 22:54   CT Angio Chest PE W and/or Wo Contrast Result Date: 09/24/2023 CLINICAL DATA:  Pulmonary embolism suspected, low to intermediate probability, positive D-dimer. Shortness of breath and chest pain for 2 days. EXAM: CT ANGIOGRAPHY CHEST WITH CONTRAST TECHNIQUE: Multidetector CT imaging of the chest was performed using the standard protocol during bolus administration of intravenous contrast. Multiplanar CT image reconstructions and MIPs were obtained to evaluate the vascular anatomy. RADIATION DOSE REDUCTION: This exam was performed according to the departmental dose-optimization program which includes automated exposure control, adjustment of the mA and/or kV according to patient size and/or use of iterative reconstruction technique. CONTRAST:  75mL OMNIPAQUE IOHEXOL 350 MG/ML SOLN COMPARISON:  06/24/2022, 09/18/2023. FINDINGS: Cardiovascular: The heart is normal in size and there is no pericardial effusion. A prosthetic aortic valve is noted. There is atherosclerotic calcification of the aorta without evidence of aneurysm. The pulmonary trunk is normal in caliber. No definite evidence of pulmonary embolism. Evaluation is limited due to respiratory motion artifact. Mediastinum/Nodes: Prominent nonspecific lymph nodes are noted in the mediastinum measuring up to 1 cm. No hilar or axillary lymphadenopathy is seen. The thyroid gland, trachea, and esophagus are within normal limits. Lungs/Pleura: Advanced emphysematous changes are present in the lungs. Pleural and parenchymal scarring is present at the lung apices, greater on the right than on the left. Airspace opacities are noted in the right middle lobe with scattered tree-in-bud nodules, new from the previous exam and likely infectious or inflammatory. Stable atelectasis or scarring is noted in the lingular segment of the left upper lobe. No effusion or pneumothorax is seen. There  is a stable 9 mm left upper lobe pulmonary nodule, axial image 75. A new 8 mm nodule is present in the left upper lobe, axial image 35. A subsolid nodule is present in the lingular segment of the left upper lobe measuring 7 mm, axial image 107. A 6 mm nodule is noted in the left upper lobe, axial image 66. Upper Abdomen: Interventional changes are noted in the right lobe of the liver. No acute abnormality is seen. Musculoskeletal: Sternotomy wires are present. Kyphosis with multilevel compression deformities in the thoracic spine is unchanged. Degenerative changes are noted in the thoracic spine. Review of the MIP images confirms the above findings. IMPRESSION: 1. No evidence of pulmonary embolism. 2. Mild airspace disease with tree-in-bud nodular opacities in the right middle lobe, likely infectious or inflammatory. 3. Scattered pulmonary nodules bilaterally measuring up to 9 mm, many new from the previous exam. Consider one of the following in 3 months for both low-risk and high-risk individuals: (a) repeat  chest CT, (b) follow-up PET-CT, or (c) tissue sampling. This recommendation follows the consensus statement: Guidelines for Management of Incidental Pulmonary Nodules Detected on CT Images: From the Fleischner Society 2017; Radiology 2017; 284:228-243. 4. Emphysema. 5. Aortic atherosclerosis. Electronically Signed   By: Thornell Sartorius M.D.   On: 09/24/2023 19:58    Procedures Procedure Name: Intubation Date/Time: 09/24/2023 10:36 PM  Performed by: Rondel Baton, MDPre-anesthesia Checklist: Patient identified, Emergency Drugs available, Suction available, Patient being monitored and Timeout performed Oxygen Delivery Method: Ambu bag Preoxygenation: Pre-oxygenation with 100% oxygen Induction Type: IV induction and Rapid sequence Laryngoscope Size: Glidescope and 3 Grade View: Grade I Tube size: 7.5 mm Number of attempts: 1 Airway Equipment and Method: Video-laryngoscopy Placement Confirmation:  ETT inserted through vocal cords under direct vision, Positive ETCO2, CO2 detector and Breath sounds checked- equal and bilateral Secured at: 21 cm Tube secured with: Tape Dental Injury: Teeth and Oropharynx as per pre-operative assessment         Medications Ordered in ED Medications  albuterol (PROVENTIL) (2.5 MG/3ML) 0.083% nebulizer solution (  Not Given 09/24/23 2151)  azithromycin (ZITHROMAX) 500 mg in sodium chloride 0.9 % 250 mL IVPB (500 mg Intravenous New Bag/Given 09/26/23 0023)  propofol (DIPRIVAN) 1000 MG/100ML infusion (0 mcg/kg/min  72.6 kg Intravenous Stopped 09/25/23 1630)  heparin injection 5,000 Units (5,000 Units Subcutaneous Given 09/25/23 2127)  pantoprazole (PROTONIX) injection 40 mg (40 mg Intravenous Given 09/25/23 2127)  insulin aspart (novoLOG) injection 0-9 Units (1 Units Subcutaneous Given 09/26/23 0024)  oseltamivir (TAMIFLU) 6 MG/ML suspension 75 mg (75 mg Per Tube Not Given 09/25/23 2125)  docusate (COLACE) 50 MG/5ML liquid 100 mg (has no administration in time range)  polyethylene glycol (MIRALAX / GLYCOLAX) packet 17 g (17 g Per Tube Given 09/25/23 0805)  methylPREDNISolone sodium succinate (SOLU-MEDROL) 40 mg/mL injection 40 mg (40 mg Intravenous Given 09/25/23 2127)  Chlorhexidine Gluconate Cloth 2 % PADS 6 each (6 each Topical Given 09/25/23 0056)  aspirin chewable tablet 81 mg (81 mg Per Tube Given 09/25/23 1207)  cefTRIAXone (ROCEPHIN) 2 g in sodium chloride 0.9 % 100 mL IVPB (0 g Intravenous Stopped 09/25/23 2201)  sertraline (ZOLOFT) tablet 25 mg (25 mg Per Tube Given 09/25/23 1207)  ipratropium (ATROVENT) nebulizer solution 0.5 mg (0.5 mg Nebulization Given 09/25/23 2310)  multivitamin with minerals tablet 1 tablet (1 tablet Per Tube Given 09/25/23 1545)  Oral care mouth rinse (has no administration in time range)  Oral care mouth rinse (has no administration in time range)  methylPREDNISolone sodium succinate (SOLU-MEDROL) 125 mg/2 mL injection 125 mg (125  mg Intravenous Given 09/24/23 1920)  ipratropium-albuterol (DUONEB) 0.5-2.5 (3) MG/3ML nebulizer solution 3 mL (3 mLs Nebulization Given 09/24/23 1851)  albuterol (PROVENTIL) (2.5 MG/3ML) 0.083% nebulizer solution 5 mg (5 mg Nebulization Given 09/24/23 1929)  iohexol (OMNIPAQUE) 350 MG/ML injection 75 mL (75 mLs Intravenous Contrast Given 09/24/23 1921)  ipratropium (ATROVENT) nebulizer solution 0.5 mg (0.5 mg Nebulization Given 09/24/23 2142)  albuterol (PROVENTIL) (2.5 MG/3ML) 0.083% nebulizer solution (10 mg/hr Nebulization Given 09/24/23 2142)  cefTRIAXone (ROCEPHIN) 2 g in sodium chloride 0.9 % 100 mL IVPB (0 g Intravenous Stopped 09/24/23 2317)  rocuronium (ZEMURON) injection 100 mg (100 mg Intravenous Given 09/24/23 2226)  ketamine (KETALAR) injection 125 mg (125 mg Intravenous Given 09/24/23 2225)  levalbuterol (XOPENEX) 1.25 MG/0.5ML nebulizer solution (10 mg  Given 09/24/23 2237)  fentaNYL (SUBLIMAZE) injection 100 mcg (100 mcg Intravenous Given 09/24/23 2316)  0.9 %  sodium chloride infusion (0  mLs Intravenous Stopped 09/25/23 0022)  potassium chloride (KLOR-CON) packet 40 mEq (40 mEq Per Tube Given 09/25/23 1207)  OLANZapine (ZYPREXA) injection 5 mg (5 mg Intravenous Given 09/25/23 2211)    ED Course/ Medical Decision Making/ A&P Clinical Course as of 09/26/23 0038  Thu Sep 24, 2023  2029 Influenza A By PCR(!): POSITIVE [RP]  2200 Attempted to reach daughter to confirm CODE STATUS without response. [RP]  2233 Dr Lonzo Candy [facility] contacted. Will admit.  [RP]    Clinical Course User Index [RP] Rondel Baton, MD                                 Medical Decision Making Amount and/or Complexity of Data Reviewed Labs: ordered. Decision-making details documented in ED Course. Radiology: ordered.  Risk OTC drugs. Prescription drug management. Decision regarding hospitalization.   DAVONNE BABY is a 63 y.o. female with comorbidities that complicate the patient evaluation  including COPD on 2 L home oxygen and aortic valve replacement who presents emergency department shortness of breath   Initial Ddx:  COPD exacerbation, URI, pneumonia, PE  MDM:  Feel the patient likely has a COPD exacerbation based on their symptoms.  Will obtain a COVID and flu to assess for URI.  Will also obtain a chest x-ray to evaluate for pneumonia or any other causes of their shortness of breath.  Given their findings on lung auscultation feel that the cause is likely due to obstructive airway disease rather than a pulmonary embolism; however, will send D-dimer to evaluate for possible PE.  Will give antibiotics based on the increased sputum production and dyspnea.  Will send off a venous blood gas because she appears drowsy at this time.  Plan:  Labs Chest x-ray COVID/flu D-dimer Steroids Nebs VBG  ED Summary/Re-evaluation:  Patient was found to have influenza.  She also had an elevated D-dimer so CTA was obtained which did not show evidence of PE but did show a possible pneumonia.  Patient was started on ceftriaxone and azithromycin.  Initial venous blood gas showed a pCO2 of over 90 and she was placed on BiPAP.  Unfortunately her mental status continued to worsen despite being placed on BiPAP.  Venous blood gas showed that she had worsening pCO2.  Because we do not have critical care at our facility felt that it would be safest to intubate the patient and transfer her to [facility].  She was intubated successfully.  Continued on nebulizer treatment and transferred to [facility], ICU.  This patient presents to the ED for concern of complaints listed in HPI, this involves an extensive number of treatment options, and is a complaint that carries with it a high risk of complications and morbidity. Disposition including potential need for admission considered.   Dispo: ICU  Records reviewed Outpatient Clinic Notes The following labs were independently interpreted: Venous blood gas  showed hypercapnic respiratory failure I independently reviewed the following imaging with scope of interpretation limited to determining acute life threatening conditions related to emergency care: Chest x-ray and agree with the radiologist interpretation with the following exceptions: none I personally reviewed and interpreted cardiac monitoring: sinus tachycardia I personally reviewed and interpreted the pt's EKG: see above for interpretation  I have reviewed the patients home medications and made adjustments as needed Consults: Critical care  CRITICAL CARE Performed by: Rondel Baton   Total critical care time: 60 minutes  Critical care time was exclusive of separately billable procedures and treating other patients.  Critical care was necessary to treat or prevent imminent or life-threatening deterioration.  Critical care was time spent personally by me on the following activities: development of treatment plan with patient and/or surrogate as well as nursing, discussions with consultants, evaluation of patient's response to treatment, examination of patient, obtaining history from patient or surrogate, ordering and performing treatments and interventions, ordering and review of laboratory studies, ordering and review of radiographic studies, pulse oximetry and re-evaluation of patient's condition.  Final Clinical Impression(s) / ED Diagnoses Final diagnoses:  COPD exacerbation (HCC)  Acute respiratory failure with hypercapnia (HCC)  Altered mental status, unspecified altered mental status type    Rx / DC Orders ED Discharge Orders     None        Rondel Baton, MD 09/26/23 0040

## 2023-09-25 ENCOUNTER — Inpatient Hospital Stay (HOSPITAL_COMMUNITY): Payer: BC Managed Care – PPO

## 2023-09-25 DIAGNOSIS — J9622 Acute and chronic respiratory failure with hypercapnia: Secondary | ICD-10-CM

## 2023-09-25 DIAGNOSIS — J09X2 Influenza due to identified novel influenza A virus with other respiratory manifestations: Secondary | ICD-10-CM | POA: Diagnosis not present

## 2023-09-25 DIAGNOSIS — I10 Essential (primary) hypertension: Secondary | ICD-10-CM

## 2023-09-25 DIAGNOSIS — J9621 Acute and chronic respiratory failure with hypoxia: Secondary | ICD-10-CM

## 2023-09-25 DIAGNOSIS — J449 Chronic obstructive pulmonary disease, unspecified: Secondary | ICD-10-CM | POA: Diagnosis not present

## 2023-09-25 DIAGNOSIS — Z952 Presence of prosthetic heart valve: Secondary | ICD-10-CM | POA: Diagnosis not present

## 2023-09-25 LAB — POCT I-STAT 7, (LYTES, BLD GAS, ICA,H+H)
Acid-Base Excess: 6 mmol/L — ABNORMAL HIGH (ref 0.0–2.0)
Acid-Base Excess: 8 mmol/L — ABNORMAL HIGH (ref 0.0–2.0)
Acid-Base Excess: 12 mmol/L — ABNORMAL HIGH (ref 0.0–2.0)
Bicarbonate: 34.8 mmol/L — ABNORMAL HIGH (ref 20.0–28.0)
Bicarbonate: 33.5 mmol/L — ABNORMAL HIGH (ref 20.0–28.0)
Bicarbonate: 38.7 mmol/L — ABNORMAL HIGH (ref 20.0–28.0)
Calcium, Ion: 1.11 mmol/L — ABNORMAL LOW (ref 1.15–1.40)
Calcium, Ion: 1.17 mmol/L (ref 1.15–1.40)
Calcium, Ion: 1.27 mmol/L (ref 1.15–1.40)
HCT: 43 % (ref 36.0–46.0)
HCT: 41 % (ref 36.0–46.0)
HCT: 38 % (ref 36.0–46.0)
Hemoglobin: 14.6 g/dL (ref 12.0–15.0)
Hemoglobin: 13.9 g/dL (ref 12.0–15.0)
Hemoglobin: 12.9 g/dL (ref 12.0–15.0)
O2 Saturation: 100 %
O2 Saturation: 98 %
O2 Saturation: 97 %
Patient temperature: 36.4
Patient temperature: 37.4
Patient temperature: 37.7
Potassium: 2.9 mmol/L — ABNORMAL LOW (ref 3.5–5.1)
Potassium: 2.9 mmol/L — ABNORMAL LOW (ref 3.5–5.1)
Potassium: 4.7 mmol/L (ref 3.5–5.1)
Sodium: 142 mmol/L (ref 135–145)
Sodium: 141 mmol/L (ref 135–145)
Sodium: 141 mmol/L (ref 135–145)
TCO2: 37 mmol/L — ABNORMAL HIGH (ref 22–32)
TCO2: 35 mmol/L — ABNORMAL HIGH (ref 22–32)
TCO2: 40 mmol/L — ABNORMAL HIGH (ref 22–32)
pCO2 arterial: 65 mm[Hg] — ABNORMAL HIGH (ref 32–48)
pCO2 arterial: 50.4 mm[Hg] — ABNORMAL HIGH (ref 32–48)
pCO2 arterial: 58.8 mm[Hg] — ABNORMAL HIGH (ref 32–48)
pH, Arterial: 7.333 — ABNORMAL LOW (ref 7.35–7.45)
pH, Arterial: 7.432 (ref 7.35–7.45)
pH, Arterial: 7.429 (ref 7.35–7.45)
pO2, Arterial: 215 mm[Hg] — ABNORMAL HIGH (ref 83–108)
pO2, Arterial: 100 mm[Hg] (ref 83–108)
pO2, Arterial: 99 mm[Hg] (ref 83–108)

## 2023-09-25 LAB — RESPIRATORY PANEL BY PCR
Adenovirus: NOT DETECTED
Bordetella Parapertussis: NOT DETECTED
Bordetella pertussis: NOT DETECTED
Chlamydophila pneumoniae: NOT DETECTED
Coronavirus 229E: NOT DETECTED
Coronavirus HKU1: NOT DETECTED
Coronavirus NL63: NOT DETECTED
Coronavirus OC43: NOT DETECTED
Influenza A: DETECTED — AB
Influenza B: NOT DETECTED
Metapneumovirus: NOT DETECTED
Mycoplasma pneumoniae: NOT DETECTED
Parainfluenza Virus 1: NOT DETECTED
Parainfluenza Virus 2: NOT DETECTED
Parainfluenza Virus 3: NOT DETECTED
Parainfluenza Virus 4: NOT DETECTED
Respiratory Syncytial Virus: NOT DETECTED
Rhinovirus / Enterovirus: NOT DETECTED

## 2023-09-25 LAB — ECHOCARDIOGRAM COMPLETE
AR max vel: 1.62 cm2
AV Area VTI: 1.3 cm2
AV Area mean vel: 1.29 cm2
AV Mean grad: 7 mm[Hg]
AV Peak grad: 12.3 mm[Hg]
Ao pk vel: 1.76 m/s
Area-P 1/2: 6.35 cm2
Height: 69 in
S' Lateral: 2.7 cm
Weight: 2515.01 [oz_av]

## 2023-09-25 LAB — CBC WITH DIFFERENTIAL/PLATELET
Abs Immature Granulocytes: 0.06 10*3/uL (ref 0.00–0.07)
Basophils Absolute: 0 10*3/uL (ref 0.0–0.1)
Basophils Relative: 0 %
Eosinophils Absolute: 0 10*3/uL (ref 0.0–0.5)
Eosinophils Relative: 0 %
HCT: 44.8 % (ref 36.0–46.0)
Hemoglobin: 14.3 g/dL (ref 12.0–15.0)
Immature Granulocytes: 2 %
Lymphocytes Relative: 11 %
Lymphs Abs: 0.4 10*3/uL — ABNORMAL LOW (ref 0.7–4.0)
MCH: 31.4 pg (ref 26.0–34.0)
MCHC: 31.9 g/dL (ref 30.0–36.0)
MCV: 98.2 fL (ref 80.0–100.0)
Monocytes Absolute: 0.1 10*3/uL (ref 0.1–1.0)
Monocytes Relative: 3 %
Neutro Abs: 3 10*3/uL (ref 1.7–7.7)
Neutrophils Relative %: 84 %
Platelets: 79 10*3/uL — ABNORMAL LOW (ref 150–400)
RBC: 4.56 MIL/uL (ref 3.87–5.11)
RDW: 13.6 % (ref 11.5–15.5)
WBC: 3.6 10*3/uL — ABNORMAL LOW (ref 4.0–10.5)
nRBC: 0 % (ref 0.0–0.2)

## 2023-09-25 LAB — BASIC METABOLIC PANEL
Anion gap: 17 — ABNORMAL HIGH (ref 5–15)
BUN: 19 mg/dL (ref 8–23)
CO2: 30 mmol/L (ref 22–32)
Calcium: 8.6 mg/dL — ABNORMAL LOW (ref 8.9–10.3)
Chloride: 96 mmol/L — ABNORMAL LOW (ref 98–111)
Creatinine, Ser: 0.81 mg/dL (ref 0.44–1.00)
GFR, Estimated: >60 mL/min (ref >60)
Glucose, Bld: 235 mg/dL — ABNORMAL HIGH (ref 70–99)
Potassium: 3 mmol/L — ABNORMAL LOW (ref 3.5–5.1)
Sodium: 143 mmol/L (ref 135–145)

## 2023-09-25 LAB — MRSA NEXT GEN BY PCR, NASAL: MRSA by PCR Next Gen: NOT DETECTED

## 2023-09-25 LAB — GLUCOSE, CAPILLARY
Glucose-Capillary: 129 mg/dL — ABNORMAL HIGH (ref 70–99)
Glucose-Capillary: 132 mg/dL — ABNORMAL HIGH (ref 70–99)
Glucose-Capillary: 137 mg/dL — ABNORMAL HIGH (ref 70–99)
Glucose-Capillary: 155 mg/dL — ABNORMAL HIGH (ref 70–99)
Glucose-Capillary: 161 mg/dL — ABNORMAL HIGH (ref 70–99)
Glucose-Capillary: 170 mg/dL — ABNORMAL HIGH (ref 70–99)
Glucose-Capillary: 231 mg/dL — ABNORMAL HIGH (ref 70–99)

## 2023-09-25 MED ORDER — CHLORHEXIDINE GLUCONATE CLOTH 2 % EX PADS
6.0000 | MEDICATED_PAD | Freq: Every day | CUTANEOUS | Status: DC
  Administered 2023-09-25 – 2023-10-04 (×9): 6 via TOPICAL

## 2023-09-25 MED ORDER — SODIUM CHLORIDE 0.9 % IV SOLN
2.0000 g | INTRAVENOUS | Status: DC
  Administered 2023-09-25: 2 g via INTRAVENOUS
  Filled 2023-09-25: qty 20

## 2023-09-25 MED ORDER — FENTANYL 2500MCG IN NS 250ML (10MCG/ML) PREMIX INFUSION
50.0000 ug/h | INTRAVENOUS | Status: DC
  Administered 2023-09-25: 100 ug/h via INTRAVENOUS

## 2023-09-25 MED ORDER — FENTANYL BOLUS VIA INFUSION
50.0000 ug | INTRAVENOUS | Status: DC | PRN
  Administered 2023-09-25 (×2): 100 ug via INTRAVENOUS

## 2023-09-25 MED ORDER — IPRATROPIUM BROMIDE 0.02 % IN SOLN
0.5000 mg | RESPIRATORY_TRACT | Status: DC
  Administered 2023-09-25 – 2023-09-26 (×6): 0.5 mg via RESPIRATORY_TRACT
  Filled 2023-09-25 (×6): qty 2.5

## 2023-09-25 MED ORDER — ADULT MULTIVITAMIN W/MINERALS CH
1.0000 | ORAL_TABLET | Freq: Every day | ORAL | Status: DC
  Administered 2023-09-25 – 2023-09-29 (×4): 1
  Filled 2023-09-25 (×4): qty 1

## 2023-09-25 MED ORDER — OLANZAPINE 10 MG IM SOLR
5.0000 mg | Freq: Once | INTRAMUSCULAR | Status: AC
Start: 2023-09-25 — End: 2023-09-25
  Administered 2023-09-25: 5 mg via INTRAVENOUS
  Filled 2023-09-25: qty 10

## 2023-09-25 MED ORDER — SERTRALINE HCL 50 MG PO TABS
25.0000 mg | ORAL_TABLET | Freq: Every day | ORAL | Status: DC
  Administered 2023-09-25 – 2023-09-29 (×4): 25 mg
  Filled 2023-09-25 (×4): qty 1

## 2023-09-25 MED ORDER — ASPIRIN 81 MG PO CHEW
81.0000 mg | CHEWABLE_TABLET | Freq: Every day | ORAL | Status: DC
  Administered 2023-09-25: 81 mg
  Filled 2023-09-25: qty 1

## 2023-09-25 MED ORDER — IPRATROPIUM-ALBUTEROL 0.5-2.5 (3) MG/3ML IN SOLN
3.0000 mL | RESPIRATORY_TRACT | Status: DC
  Administered 2023-09-25 (×2): 3 mL via RESPIRATORY_TRACT
  Filled 2023-09-25 (×2): qty 3

## 2023-09-25 MED ORDER — ORAL CARE MOUTH RINSE: 15.0000 mL | OROMUCOSAL | Status: DC | PRN

## 2023-09-25 MED ORDER — POTASSIUM CHLORIDE 20 MEQ PO PACK
40.0000 meq | PACK | ORAL | Status: AC
  Administered 2023-09-25 (×2): 40 meq
  Filled 2023-09-25 (×2): qty 2

## 2023-09-25 MED ORDER — ORAL CARE MOUTH RINSE
15.0000 mL | OROMUCOSAL | Status: DC
  Administered 2023-09-25 (×9): 15 mL via OROMUCOSAL

## 2023-09-25 MED ORDER — FENTANYL CITRATE PF 50 MCG/ML IJ SOSY: 50.0000 ug | PREFILLED_SYRINGE | Freq: Once | INTRAMUSCULAR | Status: DC

## 2023-09-25 NOTE — Procedures (Signed)
Extubation Procedure Note  Patient Details:   Name: Crystal Silva DOB: 21-Jan-1961 MRN: [id number]   Airway Documentation:    Vent end date: 09/25/23 Vent end time: 1708   Evaluation  O2 sats: stable throughout Complications: No apparent complications Patient did tolerate procedure well. Bilateral Breath Sounds: Clear, Diminished   Patient extubated per MD's order with RT, RN and MD at bedside. Cuff leak heard prior to extubating and no stridor post. Patient placed on 4L [place] and tolerating well at this time. Vitals stable.     Yes  Harumi Yamin Remonia Richter 09/25/2023, 5:12 PM

## 2023-09-25 NOTE — Progress Notes (Addendum)
Initial Nutrition Assessment  DOCUMENTATION CODES:  Not applicable  INTERVENTION:  If unable to extubate, recommend initiation of the following: Osmolite 1.5 at 45 ml/h (1080 ml per day) Start at 25 and advance by 10mL q8h to goal Prosource TF20 60 ml 1x/d Provides 1700 kcal, 88 gm protein, 823 ml free water daily MVI with minerals daily  NUTRITION DIAGNOSIS:  Increased nutrient needs related to acute illness as evidenced by estimated needs.  GOAL:  Patient will meet greater than or equal to 90% of their needs  MONITOR:  I & O's, Vent status, Labs  REASON FOR ASSESSMENT:  Ventilator    ASSESSMENT:  Pt with hx of COPD and tobacco abuse (1 PPD) presented to [facility] ED with SOB and chest pain. Found to have Flu A. Intubated in ED and admitted to [facility] ICU.  2/13 - presented to ED, intubated 2/14 - transferred to [facility] ICU   Patient is currently intubated on ventilator support. No fmaily in room at the time of assessment, but RN able to provide some hx. States that family reports that pt has not been feeling well for about 2 weeks. Some minor deficits noted on exam and also noted pt has had some weight loss in the last month, unsure when over this last month this occurred. Pt currently on SBT. Awake and able to nod head.   Discussed in rounds, pt initially did not tolerate SBT well and some sedation had to be restarted. RN said she is tolerating better now.   Will leave enteral feeding recommendations in the event that pt is unable to extubate today.   MV: 9.9 L/min Temp (24hrs), Avg:97.5 F (36.4 C), Min:95.1 F (35.1 C), Max:99.7 F (37.6 C)  Propofol: 17.4 ml/hr (459 kcal/d)  Admit weight: 72.6 kg  Current weight: 71.3 kg 3% weight loss noted in the last month, not  Intake/Output Summary (Last 24 hours) at 09/25/2023 1511 Last data filed at 09/25/2023 1200 Gross per 24 hour  Intake 1710.66 ml  Output 455 ml  Net 1255.66 ml  Net IO Since Admission: 1,255.66  mL [09/25/23 1511]  Drains/Lines: OGT 16 Fr. UOP x 24 hours  Nutritionally Relevant Medications: Scheduled Meds:  insulin aspart  0-9 Units Subcutaneous Q4H   methylPREDNISolone   40 mg Intravenous Q12H   pantoprazole IV  40 mg Intravenous QHS   potassium chloride  40 mEq Per Tube Q4H   Continuous Infusions:  azithromycin Stopped (09/25/23 0007)   fentaNYL infusion INTRAVENOUS 100 mcg/hr (09/25/23 0400)   propofol (DIPRIVAN) infusion 40 mcg/kg/min (09/25/23 0400)   PRN Meds: docusate, polyethylene glycol  Labs Reviewed: K 2.9 Chloride 96 CBG ranges from 161-231 mg/dL over the last 24 hours HgbA1c 5.6%  NUTRITION - FOCUSED PHYSICAL EXAM: Flowsheet Row Most Recent Value  Orbital Region Mild depletion  Upper Arm Region No depletion  Thoracic and Lumbar Region No depletion  Buccal Region No depletion  Temple Region Mild depletion  Clavicle Bone Region Mild depletion  Clavicle and Acromion Bone Region Mild depletion  Scapular Bone Region Mild depletion  Dorsal Hand Unable to assess  [mittens]  Patellar Region Severe depletion  Anterior Thigh Region Severe depletion  Posterior Calf Region Moderate depletion  Edema (RD Assessment) None  Hair Reviewed  Eyes Reviewed  Mouth Reviewed  Skin Reviewed  Nails Reviewed   Diet Order:   Diet Order             Diet NPO time specified  Diet effective now  EDUCATION NEEDS:  Not appropriate for education at this time  Skin:  Skin Assessment: Reviewed RN Assessment Stage 1: - Sacrum (2 x 2 cm)  Last BM:  PTA  Height:  Ht Readings from Last 1 Encounters:  09/24/23 5\' 9"  (1.753 m)   Weight:  Wt Readings from Last 1 Encounters:  09/25/23 71.3 kg   Ideal Body Weight:  65.9 kg  BMI:  Body mass index is 23.21 kg/m.  Estimated Nutritional Needs:  Kcal:  1600-1800 kcal/d Protein:  80-100g/d Fluid:  1.8-2L/d    Greig Castilla, RD, LDN Registered Dietitian II Please reach out via secure  chat Weekend on-call pager # available in [facility]

## 2023-09-25 NOTE — Progress Notes (Addendum)
On initial assessment, pt with pursed lip, shallow breathing, attempting to tripod and gripping side rails. Received in report that pt was A+O x 4 post-extubation, pt now A+O x 2, drowsy, RASS -1 on no sedation. Notified ELINK of assessment findings as well as concern that CO2 had increased on ABG when pt was weaning on vent prior to extubation. Received order to place patient on BIPAP, RT Will notified. Holding 2200 tamiflu at this time due to pt mentation and WOB, MD aware.  2100 pt uncomfortable/anxious, attempting to remove BIPAP, states "I can't breathe." ELINK notified. Zyprexa x 1 ordered and given.   0320 on assessment, pt minimally responsive, A + O x 1, RASS -2. ELINK notified. Received order for ABG. Critical CO2, Pola Corn MD made aware.   0630 pt acutely agitated. Removing BIPAP and attempting to get OOB. Pt not redirectable, repeatedly saying "help me" and "I can't breathe." Pt can only state name after being prompted several times, including to questions of where she is and what year it is. Pt cannot maintain eyes open. Prior to this, no interruptions in BIPAP since being placed on at 2045. Pt placed on NRB, assisted to tripod at EOB for a few minutes. Maintained sats 98% on 15L NRB. Pt resituated into bed at this time, placed in chair position, BIPAP on.

## 2023-09-25 NOTE — Progress Notes (Signed)
NAME:  Crystal Silva, MRN:  [id number], DOB:  29-Oct-1960, LOS: 1 ADMISSION DATE:  09/24/2023, CONSULTATION DATE:  09/25/2023 REFERRING MD:  Vivi Martens, CHIEF COMPLAINT:  Respiratory Failure    History of Present Illness:  63 year old woman who presented to [facility] 2/14 as a transfer from [facility]) ED for respiratory failure. PMHx significant for bicuspid AV with severe AS (s/p bioprosthetic AVR 2022), COPD (on 2L HOT), tobacco use, RUL pulmonary nodule.   Patient initially presented to [facility] ED with SOB/CP x 2 days. On arrival to ED, she was afebrile, HR 79, BP 161/81, tachypneic to 29-30, SpO2 96%. Reportedly became hypoxic with EMS with associated lethargy and transient bradycardia to 30s. Endorsed SOB/CP, denied other URI symptoms. +Sick contact (grandson). Labs were notable for WBC 3.5, Hgb 15.1, Plt 84 (136 one month PTA). Na 143, K 4.1, CO2 41, Cr 0.40. LFTs grossly WNL. Trop 4 > 6. BNP 50. VBG 7.36/pCO2 94/bicarb 53.0. D-dimer 2.81. LA 1.0. +Flu A positive. CTA Chest 2/13 negative for PE, +mild airspace with tree-in-bud nodular opacities RML, bilateral scattered pulmonary nodules up to 9mm (new from prior), emphysema.   Patient was initially tolerating BiPAP, but clinically decompensated from a respiratory standpoint prompting intubation. Intubated in [facility] ED.   PCCM consulted for ICU admission/transfer.   Pertinent  Medical History  COPD, chronic respiratory failure on 3 L, aortic stenosis status post bioprosthetic valve replacement 05/2021, postoperative atrial fibrillation, tobacco abuse   Significant Hospital Events: Including procedures, antibiotic start and stop dates in addition to other pertinent events   2/13 - Transferred from [facility] to [facility] for higher level of care. Flu A+. Intubated in ED. PCCM consulted for ICU admission.   Interim History / Subjective:  Intubated and on vent.   Objective   Blood pressure (!) 151/80, pulse (!) 105, temperature 99.7 F (37.6 C),  temperature source Bladder, resp. rate 20, height 5\' 9"  (1.753 m), weight 71.3 kg, last menstrual period 12/02/2013, SpO2 95%.    Vent Mode: PSV;CPAP FiO2 (%):  [40 %-60 %] 40 % Set Rate:  [20 bmp-22 bmp] 20 bmp Vt Set:  [520 mL] 520 mL PEEP:  [5 cmH20] 5 cmH20 Pressure Support:  [10 cmH20] 10 cmH20 Plateau Pressure:  [10 cmH20-27 cmH20] 10 cmH20   Intake/Output Summary (Last 24 hours) at 09/25/2023 1325 Last data filed at 09/25/2023 1200 Gross per 24 hour  Intake 1710.66 ml  Output 455 ml  Net 1255.66 ml   Filed Weights   09/24/23 1753 09/25/23 0159  Weight: 72.6 kg 71.3 kg   Examination: General: Intubated and on vent   HENT: [place]/AT.  Lungs: Decreased breath sounds R > L and bibasilar lung fields.  Cardiovascular: Tachycardiac.  Abdomen: Soft, ND. Bowel sounds present.  Extremities: Warm. No LE edema bilaterally  Neuro: Unable to assess.  GU: Foley catheter in place   Pertinent Imaging:   CTA 2/13: Mild airspace disease with tree-in-bud nodular opacities in the right middle lobe, likely infectious or inflammatory. Scattered pulmonary nodules bilaterally measuring up to 9 mm, many new from the previous exam.  CXR 2/14: Hyperinflation and pulmonary lucency from COPD. There is no edema, consolidation, effusion, or pneumothorax.  ECHO 2/14: LVEF 60 to 70%, normal LV function, no regional wall motion abnormalities, mild left ventricular hypertrophy, grade 1 diastolic dysfunction.  Normal RV function.  Moderately elevated pulmonary artery systolic pressure.  Resolved Hospital Problem list   N/A   Assessment & Plan:  Acute hypoxemic and hypercarbic respiratory  failure  AECOPD, 2L HOT Flu A infection CAP Presented [facility] ED with SOB/CP x2 days, became hypoxic, tachypneic and had transient bradycardia to 30s. VBG 7.36/pCO2 94/bicarb 53.0. Influenza A positive. CTA findings of mild airspace with tree-in-bud nodular opacities RML. Placed on BiPAP, but clinically decompensated,  required intubation and transferred to [facility] ICU.   - ABG with improving hypercarbia [7.4/50.4/33.5] - MRSA swab negative.  - Tracheal Aspirate growing GPC in pairs - Blood cultures x 2 no growth to date  - Atrovent nebulizer q4h; Avoid albuterol, results tremors - IV Azithromycin and Rocephin for CAP - Tamiflu  - Solumedrol 40mg  BID x 5-day course - Full vent support  - PAD protocol for sedation: Propofol and Fentanyl  - Recommend SAT/SBT   Bicuspid AV Severe AS s/p MVR 05/2021 (bioprosthetic) Previous MVR with Dr. Dorris Fetch 2022, on ASA 325 mg at home.  - ECHO LVEF 65-70% with G1DD (new), no regional wall motion abnormalities.  - Start Aspirin 81 mg and titrate back up to home dose with PLT monitoring  - Hold home metoprolol for now   Tobacco use Pulmonary nodules CTA Chest demonstrating multiple bilateral pulmonary nodules measuring up to 9mm, increased from prior. - Outpatient follow up for lung cancer screening/nodule characterization - Encourage smoking cessation  Chronic thrombocytopenia Acute leukopenia Monitor for any fever curves. On appropriate Abx coverage.  - Trend PLT and WBC   Hypokalemia Replete and recheck   Best Practice (right click and "Reselect all SmartList Selections" daily)   Diet/type: tubefeeds DVT prophylaxis prophylactic heparin  Pressure ulcer(s): Assessed by RN  GI prophylaxis: PPI Lines: Peripherals  Foley:  Yes, and it is still needed Code Status:  full code Last date of multidisciplinary goals of care discussion [--]  Labs   CBC: Recent Labs  Lab 09/24/23 1813 09/25/23 0114 09/25/23 0345 09/25/23 0424  WBC 3.5*  --  3.6*  --   NEUTROABS  --   --  3.0  --   HGB 15.1* 14.6 14.3 13.9  HCT 47.9* 43.0 44.8 41.0  MCV 100.2*  --  98.2  --   PLT 84*  --  79*  --     Basic Metabolic Panel: Recent Labs  Lab 09/24/23 1813 09/24/23 2251 09/25/23 0114 09/25/23 0345 09/25/23 0424  NA 143  --  142 143 141  K 4.1  --  2.9* 3.0*  2.9*  CL 91*  --   --  96*  --   CO2 41*  --   --  30  --   GLUCOSE 123*  --   --  235*  --   BUN 23  --   --  19  --   CREATININE 0.40*  --   --  0.81  --   CALCIUM 9.0  --   --  8.6*  --   MG  --  2.0  --   --   --   PHOS  --  2.8  --   --   --    GFR: Estimated Creatinine Clearance: 76.2 mL/min (by C-G formula based on SCr of 0.81 mg/dL). Recent Labs  Lab 09/24/23 1813 09/24/23 2020 09/24/23 2251 09/25/23 0345  WBC 3.5*  --   --  3.6*  LATICACIDVEN  --  1.0 1.2  --     Liver Function Tests: Recent Labs  Lab 09/24/23 1813  AST 19  ALT 14  ALKPHOS 45  BILITOT 0.9  PROT 6.4*  ALBUMIN 3.3*   No  results for input(s): "LIPASE", "AMYLASE" in the last 168 hours. No results for input(s): "AMMONIA" in the last 168 hours.  ABG    Component Value Date/Time   PHART 7.432 09/25/2023 0424   PCO2ART 50.4 (H) 09/25/2023 0424   PO2ART 100 09/25/2023 0424   HCO3 33.5 (H) 09/25/2023 0424   TCO2 35 (H) 09/25/2023 0424   ACIDBASEDEF 3.0 (H) 05/22/2021 1752   O2SAT 98 09/25/2023 0424     Coagulation Profile: No results for input(s): "INR", "PROTIME" in the last 168 hours.  Cardiac Enzymes: No results for input(s): "CKTOTAL", "CKMB", "CKMBINDEX", "TROPONINI" in the last 168 hours.  HbA1C: Hgb A1c MFr Bld  Date/Time Value Ref Range Status  08/11/2023 12:35 PM 5.6 4.8 - 5.6 % Final    Comment:    (NOTE)         Prediabetes: 5.7 - 6.4         Diabetes: >6.4         Glycemic control for adults with diabetes: <7.0   05/20/2021 02:36 PM 5.6 4.8 - 5.6 % Final    Comment:    (NOTE) Pre diabetes:          5.7%-6.4%  Diabetes:              >6.4%  Glycemic control for   <7.0% adults with diabetes     CBG: Recent Labs  Lab 09/25/23 0051 09/25/23 0349 09/25/23 0816 09/25/23 1146  GLUCAP 170* 231* 161* 132*    Review of Systems:   Intubated.   Past Medical History:  She,  has a past medical history of COPD (chronic obstructive pulmonary disease) (HCC), Headache,  and Heart murmur.   Surgical History:   Past Surgical History:  Procedure Laterality Date   AORTIC VALVE REPLACEMENT N/A 05/22/2021   Procedure: AORTIC VALVE REPLACEMENT (AVR) WITH STERNAL PLATING. 23 MM INSPIRIS RESILIA  AORTIC VALVE.;  Surgeon: [name], MD;  Location: [facility] OR;  Service: Open Heart Surgery;  Laterality: N/A;   CHOLECYSTECTOMY     lt knee surgery     RIGHT/LEFT HEART CATH AND CORONARY ANGIOGRAPHY N/A 04/30/2021   Procedure: RIGHT/LEFT HEART CATH AND CORONARY ANGIOGRAPHY;  Surgeon: Marykay Lex, MD;  Location: [facility] INVASIVE CV LAB;  Service: Cardiovascular;  Laterality: N/A;   TEE WITHOUT CARDIOVERSION N/A 05/22/2021   Procedure: TRANSESOPHAGEAL ECHOCARDIOGRAM (TEE);  Surgeon: [name], MD;  Location: [facility] OR;  Service: Open Heart Surgery;  Laterality: N/A;     Social History:   reports that she has been smoking cigarettes. She has a 44 pack-year smoking history. She has never used smokeless tobacco. She reports that she does not currently use alcohol. She reports that she does not use drugs.   Family History:  Her family history includes Heart disease in her mother; Heart failure in her mother.   Allergies No Known Allergies   Home Medications  Prior to Admission medications   Medication Sig Start Date End Date Taking? Authorizing Provider  Albuterol-Budesonide (AIRSUPRA) 90-80 MCG/ACT AERO Inhale 1 puff into the lungs every 4 (four) hours as needed (wheezing, shortness of breath).    [provider]  aspirin EC 325 MG EC tablet Take 1 tablet (325 mg total) by mouth daily. 05/26/21   Gold, Wayne E, PA-C  ipratropium-albuterol (DUONEB) 0.5-2.5 (3) MG/3ML SOLN Take 3 mLs by nebulization every 4 (four) hours as needed (Shortness of breath/wheezing). 06/17/22   Glade [name], MD  metoprolol tartrate (LOPRESSOR) 25 MG tablet Take 1  tablet (25 mg total) by mouth 2 (two) times daily. 05/28/21 08/12/23  Netta Neat., NP  Multiple Vitamin  (MULTIVITAMIN WITH MINERALS) TABS tablet Take 1 tablet by mouth daily.    [provider]  naproxen sodium (ALEVE) 220 MG tablet Take 220 mg by mouth daily as needed (headache).    [provider]  sertraline (ZOLOFT) 50 MG tablet Take 50 mg by mouth daily.    [provider]  Dwyane Luo 200-62.5-25 MCG/ACT AEPB Inhale 1 puff into the lungs daily. 04/13/23   [provider]     Critical care time:

## 2023-09-25 NOTE — Progress Notes (Signed)
eLink Physician-Brief Progress Note Patient Name: DRUE CAMERA DOB: [birth year] MRN: [id number]   Date of Service  09/25/2023  HPI/Events of Note  63 year old female with a history of severe AAS, COPD on 2 L of oxygen, and right upper lobe nodule who presents with acute hypoxic and hypercapnic respiratory failure in the setting of COPD exacerbation secondary to influenza.  Examination consistent with tachycardia.  Saturating well 95% on 40% FiO2 on the ventilator.  Currently on a propofol and fentanyl infusion.  Results show hypercapnia with pH 7.3, chronic metabolic alkalosis.  CBC with leukopenia, thrombocytopenia.  Patient has disease and underwent CT angiography of the chest.  eICU Interventions  Maintain scheduled methylprednisolone and oseltamavir.  Continue azithromycin.  Add scheduled nebs.  DVT prophylaxis with heparin GI prophylaxis with pantoprazole        Solara Goodchild 09/25/2023, 1:15 AM

## 2023-09-25 NOTE — Progress Notes (Signed)
[facility] ADULT ICU REPLACEMENT PROTOCOL   The patient does apply for the [facility] Adult ICU Electrolyte Replacment Protocol based on the criteria listed below:   1.Exclusion criteria: TCTS, ECMO, Dialysis, and Myasthenia Gravis patients 2. Is GFR >/= 30 ml/min? Yes.    Patient's GFR today is >60 3. Is SCr </= 2? Yes.   Patient's SCr is 0.81 mg/dL 4. Did SCr increase >/= 0.5 in 24 hours? No. 5.Pt's weight >40kg  Yes.   6. Abnormal electrolyte(s): Potassium  7. Electrolytes replaced per protocol 8.  Call MD STAT for K+ </= 2.5, Phos </= 1, or Mag </= 1 Physician:  Dr. Faye Ramsay [name] 09/25/2023 6:18 AM

## 2023-09-25 NOTE — Progress Notes (Signed)
Wasted 75mL of fentanyl in NS with Gerilyn Nestle, Charity fundraiser. This medication not avail to waste in 92M pyxis since it was pulled from [facility].  Oliver Barre, RN

## 2023-09-25 NOTE — Progress Notes (Addendum)
eLink Physician-Brief Progress Note Patient Name: Crystal Silva DOB: Feb 17, 1961 MRN: [id number]   Date of Service  09/25/2023  HPI/Events of Note  89F with chronic resp failure on 3L due to COPD who is admitted for acute on chronic hypoxemic respiratory failure due to influenza A infection. She was intubated in [facility] and transferred to [facility].   Extubated earlier today.  At that time, she had no respiratory distress, but now she has pursed lip breathing, attempting to tripod, has some clavicular retractions, and breaths are shallow.  She is less responsive but protecting her airway at this time.   eICU Interventions  Initiate BiPAP for increased work of breathing.  No indication to repeat ABG at this time unless patient is less responsive   2138 -add one-time olanzapine to help with anxiety associated with BiPAP.  0344 -patient has been on BiPAP all night, now less responsive and alert and oriented x 1 with drowsiness.  Still spontaneous breathing on the vent.  No hemodynamic distress.  Will obtain ABG, although likely medication effect from olanzapine.  0447 -ABG reviewed, Normal pH, stable chronic hypercapnia.  No intervention required.  Stable on BiPAP.  0559 -awoke this morning with more hyperactive delirium rather than hypoactive.  Maintaining spontaneous breathing, some increased work of breathing but improved compared to initial evaluation last night.  Not tolerating BiPAP while awake.  Mouth breathing and transition to nonrebreather for the time being.  No intervention is currently indicated.  Intervention Category Intermediate Interventions: Respiratory distress - evaluation and management  Nickole Adamek 09/25/2023, 7:54 PM

## 2023-09-25 NOTE — Progress Notes (Signed)
Report called to Chrissie Noa, RRT at [facility] on patient.

## 2023-09-26 DIAGNOSIS — J441 Chronic obstructive pulmonary disease with (acute) exacerbation: Secondary | ICD-10-CM | POA: Diagnosis not present

## 2023-09-26 DIAGNOSIS — J9621 Acute and chronic respiratory failure with hypoxia: Secondary | ICD-10-CM | POA: Diagnosis not present

## 2023-09-26 DIAGNOSIS — J9622 Acute and chronic respiratory failure with hypercapnia: Secondary | ICD-10-CM | POA: Diagnosis not present

## 2023-09-26 LAB — POCT I-STAT 7, (LYTES, BLD GAS, ICA,H+H)
Acid-Base Excess: 15 mmol/L — ABNORMAL HIGH (ref 0.0–2.0)
Acid-Base Excess: 18 mmol/L — ABNORMAL HIGH (ref 0.0–2.0)
Bicarbonate: 42.8 mmol/L — ABNORMAL HIGH (ref 20.0–28.0)
Bicarbonate: 47.5 mmol/L — ABNORMAL HIGH (ref 20.0–28.0)
Calcium, Ion: 1.3 mmol/L (ref 1.15–1.40)
Calcium, Ion: 1.24 mmol/L (ref 1.15–1.40)
HCT: 39 % (ref 36.0–46.0)
HCT: 39 % (ref 36.0–46.0)
Hemoglobin: 13.3 g/dL (ref 12.0–15.0)
Hemoglobin: 13.3 g/dL (ref 12.0–15.0)
O2 Saturation: 98 %
O2 Saturation: 99 %
Patient temperature: 37.3
Patient temperature: 97.9
Potassium: 4.8 mmol/L (ref 3.5–5.1)
Potassium: 4.6 mmol/L (ref 3.5–5.1)
Sodium: 140 mmol/L (ref 135–145)
Sodium: 143 mmol/L (ref 135–145)
TCO2: 45 mmol/L — ABNORMAL HIGH (ref 22–32)
TCO2: >50 mmol/L — ABNORMAL HIGH (ref 22–32)
pCO2 arterial: 68.4 mm[Hg] (ref 32–48)
pCO2 arterial: 82.9 mm[Hg] (ref 32–48)
pH, Arterial: 7.406 (ref 7.35–7.45)
pH, Arterial: 7.365 (ref 7.35–7.45)
pO2, Arterial: 120 mm[Hg] — ABNORMAL HIGH (ref 83–108)
pO2, Arterial: 164 mm[Hg] — ABNORMAL HIGH (ref 83–108)

## 2023-09-26 LAB — BASIC METABOLIC PANEL
Anion gap: 8 (ref 5–15)
BUN: 34 mg/dL — ABNORMAL HIGH (ref 8–23)
CO2: 35 mmol/L — ABNORMAL HIGH (ref 22–32)
Calcium: 8.9 mg/dL (ref 8.9–10.3)
Chloride: 100 mmol/L (ref 98–111)
Creatinine, Ser: 0.58 mg/dL (ref 0.44–1.00)
GFR, Estimated: >60 mL/min (ref >60)
Glucose, Bld: 153 mg/dL — ABNORMAL HIGH (ref 70–99)
Potassium: 5.1 mmol/L (ref 3.5–5.1)
Sodium: 143 mmol/L (ref 135–145)

## 2023-09-26 LAB — MAGNESIUM: Magnesium: 2.2 mg/dL (ref 1.7–2.4)

## 2023-09-26 LAB — CBC
HCT: 41.1 % (ref 36.0–46.0)
Hemoglobin: 13.2 g/dL (ref 12.0–15.0)
MCH: 31.7 pg (ref 26.0–34.0)
MCHC: 32.1 g/dL (ref 30.0–36.0)
MCV: 98.8 fL (ref 80.0–100.0)
Platelets: 103 10*3/uL — ABNORMAL LOW (ref 150–400)
RBC: 4.16 MIL/uL (ref 3.87–5.11)
RDW: 14.1 % (ref 11.5–15.5)
WBC: 13.5 10*3/uL — ABNORMAL HIGH (ref 4.0–10.5)
nRBC: 0 % (ref 0.0–0.2)

## 2023-09-26 LAB — GLUCOSE, CAPILLARY
Glucose-Capillary: 150 mg/dL — ABNORMAL HIGH (ref 70–99)
Glucose-Capillary: 141 mg/dL — ABNORMAL HIGH (ref 70–99)
Glucose-Capillary: 112 mg/dL — ABNORMAL HIGH (ref 70–99)
Glucose-Capillary: 118 mg/dL — ABNORMAL HIGH (ref 70–99)
Glucose-Capillary: 151 mg/dL — ABNORMAL HIGH (ref 70–99)
Glucose-Capillary: 97 mg/dL (ref 70–99)

## 2023-09-26 MED ORDER — ARFORMOTEROL TARTRATE 15 MCG/2ML IN NEBU
15.0000 ug | INHALATION_SOLUTION | Freq: Two times a day (BID) | RESPIRATORY_TRACT | Status: DC
  Administered 2023-09-26 – 2023-10-06 (×18): 15 ug via RESPIRATORY_TRACT
  Filled 2023-09-26 (×17): qty 2

## 2023-09-26 MED ORDER — ORAL CARE MOUTH RINSE
15.0000 mL | OROMUCOSAL | Status: DC
  Administered 2023-09-26 – 2023-09-27 (×3): 15 mL via OROMUCOSAL

## 2023-09-26 MED ORDER — REVEFENACIN 175 MCG/3ML IN SOLN
175.0000 ug | Freq: Every day | RESPIRATORY_TRACT | Status: DC
  Administered 2023-09-26 – 2023-10-06 (×11): 175 ug via RESPIRATORY_TRACT
  Filled 2023-09-26 (×11): qty 3

## 2023-09-26 MED ORDER — DEXMEDETOMIDINE HCL IN NACL 400 MCG/100ML IV SOLN
0.0000 ug/kg/h | INTRAVENOUS | Status: DC
  Administered 2023-09-26: 0.4 ug/kg/h via INTRAVENOUS
  Administered 2023-09-27: 1 ug/kg/h via INTRAVENOUS
  Filled 2023-09-26 (×3): qty 100

## 2023-09-26 MED ORDER — FUROSEMIDE 10 MG/ML IJ SOLN
20.0000 mg | Freq: Once | INTRAMUSCULAR | Status: AC
  Administered 2023-09-26: 20 mg via INTRAVENOUS
  Filled 2023-09-26: qty 2

## 2023-09-26 MED ORDER — LEVALBUTEROL HCL 1.25 MG/0.5ML IN NEBU
1.2500 mg | INHALATION_SOLUTION | Freq: Four times a day (QID) | RESPIRATORY_TRACT | Status: DC | PRN
  Administered 2023-09-29: 1.25 mg via RESPIRATORY_TRACT
  Filled 2023-09-26 (×2): qty 0.5

## 2023-09-26 MED ORDER — SULFAMETHOXAZOLE-TRIMETHOPRIM 400-80 MG/5ML IV SOLN
320.0000 mg | Freq: Two times a day (BID) | INTRAVENOUS | Status: DC
  Administered 2023-09-26: 320 mg via INTRAVENOUS
  Filled 2023-09-26 (×3): qty 20

## 2023-09-26 MED ORDER — ASPIRIN 325 MG PO TABS
325.0000 mg | ORAL_TABLET | Freq: Every day | ORAL | Status: DC
Start: 2023-09-26 — End: 2023-09-27

## 2023-09-26 MED ORDER — ORAL CARE MOUTH RINSE: 15.0000 mL | OROMUCOSAL | Status: DC | PRN

## 2023-09-26 MED ORDER — BUDESONIDE 0.5 MG/2ML IN SUSP
0.5000 mg | Freq: Two times a day (BID) | RESPIRATORY_TRACT | Status: DC
  Administered 2023-09-26 – 2023-10-06 (×21): 0.5 mg via RESPIRATORY_TRACT
  Filled 2023-09-26 (×19): qty 2

## 2023-09-26 NOTE — Progress Notes (Signed)
PT Cancellation Note  Patient Details Name: Crystal Silva MRN: [id number] DOB: 15-Jan-1961   Cancelled Treatment:    Reason Eval/Treat Not Completed: Other (comment)  Spoke with RN. Attempted wean recently and did not tolerate, on bipap. Reports squeezing hands with cues, but not opening eyes or responding to more active commands.   Will attempt comprehensive PT evaluation tomorrow as tolerated.  Please secure chat myself or [facility] PT if more urgent evaluation requested and patient can tolerate.   Kathlyn Sacramento, PT, DPT [facility]  Rehabilitation Services Physical Therapist Office: [phone] Website: [email or web address]  Berton Mount 09/26/2023, 2:18 PM

## 2023-09-26 NOTE — Progress Notes (Signed)
NAME:  Crystal Silva, MRN:  [id number], DOB:  Jan 04, 1961, LOS: 2 ADMISSION DATE:  09/24/2023, CONSULTATION DATE:  09/25/2023 REFERRING MD:  Vivi Martens, CHIEF COMPLAINT:  Respiratory Failure    History of Present Illness:  63 year old woman who presented to [facility] 2/14 as a transfer from [facility]) ED for respiratory failure. PMHx significant for bicuspid AV with severe AS (s/p bioprosthetic AVR 2022), COPD (on 2L HOT), tobacco use, RUL pulmonary nodule.   Patient initially presented to [facility] ED with SOB/CP x 2 days. On arrival to ED, she was afebrile, HR 79, BP 161/81, tachypneic to 29-30, SpO2 96%. Reportedly became hypoxic with EMS with associated lethargy and transient bradycardia to 30s. Endorsed SOB/CP, denied other URI symptoms. +Sick contact (grandson). Labs were notable for WBC 3.5, Hgb 15.1, Plt 84 (136 one month PTA). Na 143, K 4.1, CO2 41, Cr 0.40. LFTs grossly WNL. Trop 4 > 6. BNP 50. VBG 7.36/pCO2 94/bicarb 53.0. D-dimer 2.81. LA 1.0. +Flu A positive. CTA Chest 2/13 negative for PE, +mild airspace with tree-in-bud nodular opacities RML, bilateral scattered pulmonary nodules up to 9mm (new from prior), emphysema.   Patient was initially tolerating BiPAP, but clinically decompensated from a respiratory standpoint prompting intubation. Intubated in [facility] ED.   PCCM consulted for ICU admission/transfer.   Pertinent  Medical History  COPD, chronic respiratory failure on 3 L, aortic stenosis status post bioprosthetic valve replacement 05/2021, postoperative atrial fibrillation, tobacco abuse   Significant Hospital Events: Including procedures, antibiotic start and stop dates in addition to other pertinent events   2/13 - Transferred from [facility] to [facility] for higher level of care. Flu A+. Intubated in ED. PCCM consulted for ICU admission.  2/14 extubated late afternoon, overnight had resp distress - placed on bipap, anxiety issues  Interim History / Subjective:   Extubated late  yesterday afternoon due to good SBT Placed on bipap overnight for resp distress Increasing pCO2 on ABG Calm and tolerating Bipap this morning  Objective   Blood pressure (!) 144/72, pulse 78, temperature 99.1 F (37.3 C), temperature source Bladder, resp. rate 18, height 5\' 9"  (1.753 m), weight 71.2 kg, last menstrual period 12/02/2013, SpO2 99%.    Vent Mode: PCV;BIPAP FiO2 (%):  [40 %] 40 % Set Rate:  [15 bmp-25 bmp] 15 bmp Vt Set:  [520 mL] 520 mL PEEP:  [5 cmH20] 5 cmH20 Pressure Support:  [8 cmH20-10 cmH20] 8 cmH20 Plateau Pressure:  [10 cmH20-20 cmH20] 10 cmH20   Intake/Output Summary (Last 24 hours) at 09/26/2023 0749 Last data filed at 09/26/2023 0600 Gross per 24 hour  Intake 546.58 ml  Output 610 ml  Net -63.42 ml   Filed Weights   09/24/23 1753 09/25/23 0159 09/26/23 0500  Weight: 72.6 kg 71.3 kg 71.2 kg   Examination: General: on bipap HENT: [place]/AT.  Lungs: Decreased breath sounds, mild scattered wheezing Cardiovascular: rrr Abdomen: Soft, ND. Bowel sounds present.  Extremities: Warm. No LE edema bilaterally  Neuro: Unable to assess.  GU: Foley catheter in place   Pertinent Imaging:   CTA 2/13: Mild airspace disease with tree-in-bud nodular opacities in the right middle lobe, likely infectious or inflammatory. Scattered pulmonary nodules bilaterally measuring up to 9 mm, many new from the previous exam.  CXR 2/14: Hyperinflation and pulmonary lucency from COPD. There is no edema, consolidation, effusion, or pneumothorax.  ECHO 2/14: LVEF 60 to 70%, normal LV function, no regional wall motion abnormalities, mild left ventricular hypertrophy, grade 1 diastolic dysfunction.  Normal  RV function.  Moderately elevated pulmonary artery systolic pressure.  Resolved Hospital Problem list   N/A   Assessment & Plan:  Acute hypoxemic and hypercarbic respiratory failure  COPD with Acute Exacerbation Flu A infection CAP Presented [facility] ED with SOB/CP x2 days, became  hypoxic, tachypneic and had transient bradycardia to 30s. VBG 7.36/pCO2 94/bicarb 53.0. Influenza A positive. CTA findings of mild airspace with tree-in-bud nodular opacities RML. Placed on BiPAP, but clinically decompensated, required intubation and transferred to [facility] ICU. Extubated 2/14.  - MRSA swab negative.  - Tracheal Aspirate growing GPC in pairs - Blood cultures x 2 no growth to date  - Avoid albuterol, results tremors - start budesonide, brovana and yupelri nebs - IV Azithromycin and Rocephin for CAP - Tamiflu  - Solumedrol 40mg  BID, will plan extend oral taper when appropriate - PRN precedex if anxious while on Bipap - 20mg  IV lasix    Bicuspid AV Severe AS s/p MVR 05/2021 (bioprosthetic) Previous MVR with Dr. Dorris Fetch 2022, on ASA 325 mg at home.  - ECHO LVEF 65-70% with G1DD (new), no regional wall motion abnormalities.  - resume home Aspirin 325 mg  - resume home metoprolol when able   Tobacco use Pulmonary nodules CTA Chest demonstrating multiple bilateral pulmonary nodules measuring up to 9mm, increased from prior. - Outpatient follow up for lung cancer screening/nodule characterization - Encourage smoking cessation  Chronic thrombocytopenia Acute leukopenia Monitor for any fever curves. On appropriate Abx coverage.  - Trend PLT and WBC   Best Practice (right click and "Reselect all SmartList Selections" daily)   Diet/type: tubefeeds and NPO w/ oral meds DVT prophylaxis prophylactic heparin  Pressure ulcer(s): Assessed by RN  GI prophylaxis: PPI Lines: Peripherals  Foley:  Yes, and it is still needed Code Status:  full code Last date of multidisciplinary goals of care discussion [--]  Labs   CBC: Recent Labs  Lab 09/24/23 1813 09/25/23 0114 09/25/23 0345 09/25/23 0424 09/25/23 1436 09/26/23 0311 09/26/23 0435  WBC 3.5*  --  3.6*  --   --  13.5*  --   NEUTROABS  --   --  3.0  --   --   --   --   HGB 15.1*   < > 14.3 13.9 12.9 13.2 13.3  HCT  47.9*   < > 44.8 41.0 38.0 41.1 39.0  MCV 100.2*  --  98.2  --   --  98.8  --   PLT 84*  --  79*  --   --  103*  --    < > = values in this interval not displayed.    Basic Metabolic Panel: Recent Labs  Lab 09/24/23 1813 09/24/23 2251 09/25/23 0114 09/25/23 0345 09/25/23 0424 09/25/23 1436 09/26/23 0311 09/26/23 0435  NA 143  --    < > 143 141 141 143 140  K 4.1  --    < > 3.0* 2.9* 4.7 5.1 4.8  CL 91*  --   --  96*  --   --  100  --   CO2 41*  --   --  30  --   --  35*  --   GLUCOSE 123*  --   --  235*  --   --  153*  --   BUN 23  --   --  19  --   --  34*  --   CREATININE 0.40*  --   --  0.81  --   --  0.58  --   CALCIUM 9.0  --   --  8.6*  --   --  8.9  --   MG  --  2.0  --   --   --   --  2.2  --   PHOS  --  2.8  --   --   --   --   --   --    < > = values in this interval not displayed.   GFR: Estimated Creatinine Clearance: 77.2 mL/min (by C-G formula based on SCr of 0.58 mg/dL). Recent Labs  Lab 09/24/23 1813 09/24/23 2020 09/24/23 2251 09/25/23 0345 09/26/23 0311  WBC 3.5*  --   --  3.6* 13.5*  LATICACIDVEN  --  1.0 1.2  --   --     Liver Function Tests: Recent Labs  Lab 09/24/23 1813  AST 19  ALT 14  ALKPHOS 45  BILITOT 0.9  PROT 6.4*  ALBUMIN 3.3*   No results for input(s): "LIPASE", "AMYLASE" in the last 168 hours. No results for input(s): "AMMONIA" in the last 168 hours.  ABG    Component Value Date/Time   PHART 7.406 09/26/2023 0435   PCO2ART 68.4 (HH) 09/26/2023 0435   PO2ART 120 (H) 09/26/2023 0435   HCO3 42.8 (H) 09/26/2023 0435   TCO2 45 (H) 09/26/2023 0435   ACIDBASEDEF 3.0 (H) 05/22/2021 1752   O2SAT 98 09/26/2023 0435     Coagulation Profile: No results for input(s): "INR", "PROTIME" in the last 168 hours.  Cardiac Enzymes: No results for input(s): "CKTOTAL", "CKMB", "CKMBINDEX", "TROPONINI" in the last 168 hours.  HbA1C: Hgb A1c MFr Bld  Date/Time Value Ref Range Status  08/11/2023 12:35 PM 5.6 4.8 - 5.6 % Final     Comment:    (NOTE)         Prediabetes: 5.7 - 6.4         Diabetes: >6.4         Glycemic control for adults with diabetes: <7.0   05/20/2021 02:36 PM 5.6 4.8 - 5.6 % Final    Comment:    (NOTE) Pre diabetes:          5.7%-6.4%  Diabetes:              >6.4%  Glycemic control for   <7.0% adults with diabetes     CBG: Recent Labs  Lab 09/25/23 1632 09/25/23 2028 09/25/23 2258 09/26/23 0315 09/26/23 0738  GLUCAP 155* 129* 137* 150* 141*      Critical care time: 35 minutes     Melody Comas, MD [place] [facility] Office: [phone]   See Amion for personal pager PCCM on call pager [phone] until 7pm. Please call Elink 7p-7a. [phone]

## 2023-09-27 ENCOUNTER — Inpatient Hospital Stay (HOSPITAL_COMMUNITY): Payer: BC Managed Care – PPO

## 2023-09-27 ENCOUNTER — Other Ambulatory Visit: Payer: Self-pay

## 2023-09-27 DIAGNOSIS — E43 Unspecified severe protein-calorie malnutrition: Secondary | ICD-10-CM

## 2023-09-27 DIAGNOSIS — J09X2 Influenza due to identified novel influenza A virus with other respiratory manifestations: Secondary | ICD-10-CM | POA: Diagnosis not present

## 2023-09-27 DIAGNOSIS — J9622 Acute and chronic respiratory failure with hypercapnia: Secondary | ICD-10-CM | POA: Diagnosis not present

## 2023-09-27 DIAGNOSIS — J9621 Acute and chronic respiratory failure with hypoxia: Secondary | ICD-10-CM | POA: Diagnosis not present

## 2023-09-27 LAB — GLUCOSE, CAPILLARY
Glucose-Capillary: 159 mg/dL — ABNORMAL HIGH (ref 70–99)
Glucose-Capillary: 115 mg/dL — ABNORMAL HIGH (ref 70–99)
Glucose-Capillary: 144 mg/dL — ABNORMAL HIGH (ref 70–99)
Glucose-Capillary: 124 mg/dL — ABNORMAL HIGH (ref 70–99)
Glucose-Capillary: 178 mg/dL — ABNORMAL HIGH (ref 70–99)
Glucose-Capillary: 179 mg/dL — ABNORMAL HIGH (ref 70–99)

## 2023-09-27 LAB — POCT I-STAT 7, (LYTES, BLD GAS, ICA,H+H)
Acid-Base Excess: 16 mmol/L — ABNORMAL HIGH (ref 0.0–2.0)
Acid-Base Excess: 16 mmol/L — ABNORMAL HIGH (ref 0.0–2.0)
Bicarbonate: 46 mmol/L — ABNORMAL HIGH (ref 20.0–28.0)
Bicarbonate: 42.9 mmol/L — ABNORMAL HIGH (ref 20.0–28.0)
Calcium, Ion: 1.24 mmol/L (ref 1.15–1.40)
Calcium, Ion: 1.19 mmol/L (ref 1.15–1.40)
HCT: 38 % (ref 36.0–46.0)
HCT: 38 % (ref 36.0–46.0)
Hemoglobin: 12.9 g/dL (ref 12.0–15.0)
Hemoglobin: 12.9 g/dL (ref 12.0–15.0)
O2 Saturation: 98 %
O2 Saturation: 100 %
Patient temperature: 36.3
Patient temperature: 36.6
Potassium: 4.8 mmol/L (ref 3.5–5.1)
Potassium: 4.8 mmol/L (ref 3.5–5.1)
Sodium: 141 mmol/L (ref 135–145)
Sodium: 139 mmol/L (ref 135–145)
TCO2: 49 mmol/L — ABNORMAL HIGH (ref 22–32)
TCO2: 45 mmol/L — ABNORMAL HIGH (ref 22–32)
pCO2 arterial: 82.2 mm[Hg] (ref 32–48)
pCO2 arterial: 58.5 mm[Hg] — ABNORMAL HIGH (ref 32–48)
pH, Arterial: 7.352 (ref 7.35–7.45)
pH, Arterial: 7.471 — ABNORMAL HIGH (ref 7.35–7.45)
pO2, Arterial: 118 mm[Hg] — ABNORMAL HIGH (ref 83–108)
pO2, Arterial: 264 mm[Hg] — ABNORMAL HIGH (ref 83–108)

## 2023-09-27 LAB — CBC
HCT: 41.6 % (ref 36.0–46.0)
Hemoglobin: 13.2 g/dL (ref 12.0–15.0)
MCH: 31.7 pg (ref 26.0–34.0)
MCHC: 31.7 g/dL (ref 30.0–36.0)
MCV: 99.8 fL (ref 80.0–100.0)
Platelets: 113 10*3/uL — ABNORMAL LOW (ref 150–400)
RBC: 4.17 MIL/uL (ref 3.87–5.11)
RDW: 13.5 % (ref 11.5–15.5)
WBC: 10.8 10*3/uL — ABNORMAL HIGH (ref 4.0–10.5)
nRBC: 0 % (ref 0.0–0.2)

## 2023-09-27 LAB — BASIC METABOLIC PANEL
Anion gap: 10 (ref 5–15)
BUN: 33 mg/dL — ABNORMAL HIGH (ref 8–23)
CO2: 37 mmol/L — ABNORMAL HIGH (ref 22–32)
Calcium: 8.8 mg/dL — ABNORMAL LOW (ref 8.9–10.3)
Chloride: 96 mmol/L — ABNORMAL LOW (ref 98–111)
Creatinine, Ser: 0.67 mg/dL (ref 0.44–1.00)
GFR, Estimated: >60 mL/min (ref >60)
Glucose, Bld: 172 mg/dL — ABNORMAL HIGH (ref 70–99)
Potassium: 5.2 mmol/L — ABNORMAL HIGH (ref 3.5–5.1)
Sodium: 143 mmol/L (ref 135–145)

## 2023-09-27 LAB — PHOSPHORUS
Phosphorus: 3.4 mg/dL (ref 2.5–4.6)
Phosphorus: 2.4 mg/dL — ABNORMAL LOW (ref 2.5–4.6)

## 2023-09-27 LAB — MAGNESIUM
Magnesium: 2.4 mg/dL (ref 1.7–2.4)
Magnesium: 2.3 mg/dL (ref 1.7–2.4)

## 2023-09-27 LAB — CULTURE, RESPIRATORY W GRAM STAIN

## 2023-09-27 MED ORDER — SODIUM CHLORIDE 0.9% FLUSH: 10.0000 mL | INTRAVENOUS | Status: DC | PRN

## 2023-09-27 MED ORDER — HALOPERIDOL LACTATE 5 MG/ML IJ SOLN
2.0000 mg | Freq: Once | INTRAMUSCULAR | Status: AC
Start: 2023-09-27 — End: 2023-09-27
  Administered 2023-09-27: 2 mg via INTRAVENOUS

## 2023-09-27 MED ORDER — ORAL CARE MOUTH RINSE
15.0000 mL | OROMUCOSAL | Status: DC
  Administered 2023-09-27 – 2023-09-29 (×23): 15 mL via OROMUCOSAL

## 2023-09-27 MED ORDER — ETOMIDATE 2 MG/ML IV SOLN
INTRAVENOUS | Status: AC
  Administered 2023-09-27: 20 mg via INTRAVENOUS
  Filled 2023-09-27: qty 20

## 2023-09-27 MED ORDER — PROSOURCE TF20 ENFIT COMPATIBL EN LIQD
60.0000 mL | Freq: Every day | ENTERAL | Status: DC
  Administered 2023-09-27 – 2023-09-28 (×2): 60 mL
  Filled 2023-09-27 (×2): qty 60

## 2023-09-27 MED ORDER — ETOMIDATE 2 MG/ML IV SOLN: 20.0000 mg | Freq: Once | INTRAVENOUS | Status: AC

## 2023-09-27 MED ORDER — VITAL HIGH PROTEIN PO LIQD
1000.0000 mL | ORAL | Status: DC
  Administered 2023-09-27 (×2): 1000 mL

## 2023-09-27 MED ORDER — MIDAZOLAM HCL 2 MG/2ML IJ SOLN: 2.0000 mg | Freq: Once | INTRAMUSCULAR | Status: AC

## 2023-09-27 MED ORDER — ROCURONIUM BROMIDE 10 MG/ML (PF) SYRINGE
50.0000 mg | PREFILLED_SYRINGE | Freq: Once | INTRAVENOUS | Status: AC
Start: 2023-09-27 — End: 2023-09-27

## 2023-09-27 MED ORDER — FENTANYL BOLUS VIA INFUSION
25.0000 ug | INTRAVENOUS | Status: DC | PRN
  Administered 2023-09-28: 100 ug via INTRAVENOUS

## 2023-09-27 MED ORDER — ASPIRIN 325 MG PO TABS
325.0000 mg | ORAL_TABLET | Freq: Every day | ORAL | Status: DC
  Administered 2023-09-28 – 2023-09-29 (×2): 325 mg
  Filled 2023-09-27 (×2): qty 1

## 2023-09-27 MED ORDER — HALOPERIDOL LACTATE 5 MG/ML IJ SOLN
INTRAMUSCULAR | Status: AC
  Filled 2023-09-27: qty 1

## 2023-09-27 MED ORDER — SULFAMETHOXAZOLE-TRIMETHOPRIM 800-160 MG PO TABS
2.0000 | ORAL_TABLET | Freq: Two times a day (BID) | ORAL | Status: DC
  Administered 2023-09-27 (×2): 2
  Administered 2023-09-28: 1
  Administered 2023-09-28 – 2023-09-29 (×2): 2
  Filled 2023-09-27 (×5): qty 2

## 2023-09-27 MED ORDER — OSELTAMIVIR PHOSPHATE 6 MG/ML PO SUSR
75.0000 mg | Freq: Two times a day (BID) | ORAL | Status: DC
  Administered 2023-09-27 – 2023-09-28 (×3): 75 mg
  Filled 2023-09-27 (×5): qty 12.5

## 2023-09-27 MED ORDER — PROPOFOL 1000 MG/100ML IV EMUL
INTRAVENOUS | Status: AC
  Administered 2023-09-27: 10 ug/kg/min via INTRAVENOUS
  Filled 2023-09-27: qty 100

## 2023-09-27 MED ORDER — FENTANYL 2500MCG IN NS 250ML (10MCG/ML) PREMIX INFUSION
25.0000 ug/h | INTRAVENOUS | Status: DC
  Administered 2023-09-27: 25 ug/h via INTRAVENOUS
  Filled 2023-09-27 (×2): qty 250

## 2023-09-27 MED ORDER — PROPOFOL 1000 MG/100ML IV EMUL
0.0000 ug/kg/min | INTRAVENOUS | Status: DC
  Administered 2023-09-27: 5 ug/kg/min via INTRAVENOUS
  Filled 2023-09-27: qty 100

## 2023-09-27 MED ORDER — OSELTAMIVIR PHOSPHATE 75 MG PO CAPS
75.0000 mg | ORAL_CAPSULE | Freq: Two times a day (BID) | ORAL | Status: DC
  Administered 2023-09-27: 75 mg
  Filled 2023-09-27 (×2): qty 1

## 2023-09-27 MED ORDER — FENTANYL CITRATE PF 50 MCG/ML IJ SOSY
PREFILLED_SYRINGE | INTRAMUSCULAR | Status: AC
  Administered 2023-09-27: 50 ug via INTRAVENOUS
  Filled 2023-09-27: qty 2

## 2023-09-27 MED ORDER — SUCCINYLCHOLINE CHLORIDE 200 MG/10ML IV SOSY
PREFILLED_SYRINGE | INTRAVENOUS | Status: AC
  Filled 2023-09-27: qty 10

## 2023-09-27 MED ORDER — ORAL CARE MOUTH RINSE: 15.0000 mL | OROMUCOSAL | Status: DC | PRN

## 2023-09-27 MED ORDER — LORAZEPAM 2 MG/ML IJ SOLN
2.0000 mg | Freq: Once | INTRAMUSCULAR | Status: AC
  Administered 2023-09-27: 2 mg via INTRAVENOUS
  Filled 2023-09-27: qty 1

## 2023-09-27 MED ORDER — LORAZEPAM 2 MG/ML IJ SOLN
0.5000 mg | Freq: Four times a day (QID) | INTRAMUSCULAR | Status: DC | PRN
  Filled 2023-09-27: qty 1

## 2023-09-27 MED ORDER — POLYETHYLENE GLYCOL 3350 17 G PO PACK
17.0000 g | PACK | Freq: Every day | ORAL | Status: DC
  Administered 2023-09-27 – 2023-09-28 (×2): 17 g
  Filled 2023-09-27 (×3): qty 1

## 2023-09-27 MED ORDER — DOCUSATE SODIUM 50 MG/5ML PO LIQD
100.0000 mg | Freq: Two times a day (BID) | ORAL | Status: DC
  Administered 2023-09-27 – 2023-09-28 (×3): 100 mg
  Filled 2023-09-27 (×4): qty 10

## 2023-09-27 MED ORDER — MIDAZOLAM HCL 2 MG/2ML IJ SOLN
INTRAMUSCULAR | Status: AC
  Administered 2023-09-27: 2 mg via INTRAVENOUS
  Filled 2023-09-27: qty 2

## 2023-09-27 MED ORDER — KETAMINE HCL 50 MG/5ML IJ SOSY
PREFILLED_SYRINGE | INTRAMUSCULAR | Status: AC
  Filled 2023-09-27: qty 10

## 2023-09-27 MED ORDER — METHYLPREDNISOLONE SODIUM SUCC 125 MG IJ SOLR
125.0000 mg | INTRAMUSCULAR | Status: DC
  Administered 2023-09-27: 125 mg via INTRAVENOUS
  Filled 2023-09-27: qty 2

## 2023-09-27 MED ORDER — FENTANYL CITRATE PF 50 MCG/ML IJ SOSY: 50.0000 ug | PREFILLED_SYRINGE | Freq: Once | INTRAMUSCULAR | Status: AC

## 2023-09-27 MED ORDER — FENTANYL CITRATE PF 50 MCG/ML IJ SOSY: 25.0000 ug | PREFILLED_SYRINGE | Freq: Once | INTRAMUSCULAR | Status: DC

## 2023-09-27 MED ORDER — ROCURONIUM BROMIDE 10 MG/ML (PF) SYRINGE
PREFILLED_SYRINGE | INTRAVENOUS | Status: AC
  Administered 2023-09-27: 50 mg via INTRAVENOUS
  Filled 2023-09-27: qty 10

## 2023-09-27 MED ORDER — SODIUM CHLORIDE 0.9% FLUSH
10.0000 mL | Freq: Two times a day (BID) | INTRAVENOUS | Status: DC
  Administered 2023-09-27 – 2023-09-28 (×3): 10 mL
  Administered 2023-09-29: 30 mL
  Administered 2023-09-29 – 2023-09-30 (×2): 10 mL
  Administered 2023-09-30: 30 mL
  Administered 2023-10-01 (×2): 10 mL
  Administered 2023-10-02: 20 mL
  Administered 2023-10-02 – 2023-10-05 (×7): 10 mL

## 2023-09-27 NOTE — Progress Notes (Signed)
Patient placed on BIPAP per MD's order, patient is tolerating well at this time.

## 2023-09-27 NOTE — Progress Notes (Signed)
Peripherally Inserted Central Catheter Placement  The IV Nurse has discussed with the patient and/or persons authorized to consent for the patient, the purpose of this procedure and the potential benefits and risks involved with this procedure.  The benefits include less needle sticks, lab draws from the catheter, and the patient may be discharged home with the catheter. Risks include, but not limited to, infection, bleeding, blood clot (thrombus formation), and puncture of an artery; nerve damage and irregular heartbeat and possibility to perform a PICC exchange if needed/ordered by physician.  Alternatives to this procedure were also discussed.  Bard Power PICC patient education guide, fact sheet on infection prevention and patient information card has been provided to patient /or left at bedside.  Telephone consent obtained from daughter.  PICC placed by Burnard Bunting RN  PICC Placement Documentation  PICC Triple Lumen 09/27/23 Right Brachial 40 cm 0 cm (Active)  Indication for Insertion or Continuance of Line Vasoactive infusions 09/27/23 1305  Exposed Catheter (cm) 0 cm 09/27/23 1305  Site Assessment Clean, Dry, Intact 09/27/23 1305  Lumen #1 Status Flushed;Saline locked;Blood return noted 09/27/23 1305  Lumen #2 Status Flushed;Saline locked;Blood return noted 09/27/23 1305  Lumen #3 Status Flushed;Saline locked;Blood return noted 09/27/23 1305  Dressing Type Transparent;Securing device 09/27/23 1305  Dressing Status Antimicrobial disc/dressing in place;Clean, Dry, Intact 09/27/23 1305  Line Care Connections checked and tightened 09/27/23 1305  Line Adjustment (NICU/IV Team Only) No 09/27/23 1305  Dressing Intervention New dressing;Adhesive placed at insertion site (IV team only);Adhesive placed around edges of dressing (IV team/ICU RN only) 09/27/23 1305  Dressing Change Due 10/04/23 09/27/23 1305       Elliot Dally 09/27/2023, 1:06 PM

## 2023-09-27 NOTE — Progress Notes (Signed)
PT Cancellation Note  Patient Details Name: Crystal Silva MRN: [id number] DOB: 04-29-1961   Cancelled Treatment:    Reason Eval/Treat Not Completed: Medical issues which prohibited therapy, pt re-intubated this afternoon due to continued respiratory issues and inability to tolerate BiPAP. Will continue to follow and evaluate as appropriate.   Vickki Muff, PT, DPT   Acute Rehabilitation Department Office [phone] Secure Chat Communication Preferred   Ronnie Derby 09/27/2023, 5:26 PM

## 2023-09-27 NOTE — Progress Notes (Signed)
eLink Physician-Brief Progress Note Patient Name: Crystal Silva DOB: Sep 10, 1960 MRN: [id number]   Date of Service  09/27/2023  HPI/Events of Note  COPD patient with hypercapnia and hypoxia who has restless agitation despite precedex.  She will not leave bipap on and is screaming out.  Resp status is tenuous  eICU Interventions  Haldol x 1. At risk for resp decompensation     Intervention Category Intermediate Interventions: Other:  Henry Russel, P 09/27/2023, 1:08 AM

## 2023-09-27 NOTE — Procedures (Signed)
Intubation Procedure Note  RAYAN DYAL  [id number]  [birth year]  Date:09/27/23  Time:12:19 PM   Provider Performing:Konrad Hoak B Satoya Feeley    Procedure: Intubation (31500)  Indication(s) Respiratory Failure  Consent Unable to obtain consent due to emergent nature of procedure.   Anesthesia Etomidate, Versed, Fentanyl, and Rocuronium   Time Out Verified patient identification, verified procedure, site/side was marked, verified correct patient position, special equipment/implants available, medications/allergies/relevant history reviewed, required imaging and test results available.   Sterile Technique Usual hand hygeine, masks, and gloves were used   Procedure Description Patient positioned in bed supine.  Sedation given as noted above.  Patient was intubated with endotracheal tube using Glidescope.  View was Grade 1 full glottis .  Number of attempts was 1.  Colorimetric CO2 detector was consistent with tracheal placement.   Complications/Tolerance None; patient tolerated the procedure well. Chest X-ray is ordered to verify placement.   EBL Minimal   Specimen(s) None

## 2023-09-27 NOTE — Progress Notes (Signed)
SLP Cancellation Note  Patient Details Name: TAMMI BOULIER MRN: [id number] DOB: Apr 03, 1961   Cancelled treatment:       Reason Eval/Treat Not Completed: Medical issues which prohibited therapy. Patient requiring Precedex, continued agitation and also on Bipap. Will hold eval today and plan to f/u in am next date.   Ferdinand Lango MA, CCC-SLP    Porcia Morganti Meryl 09/27/2023, 8:51 AM

## 2023-09-27 NOTE — Plan of Care (Signed)
 Pt refusing to wear BIPAP, continually removing mask. Precedex started to help patient relax with minimal effect. Placed on [place] at 3-4L of O2, pt sitting at 90 degrees stating that she can breathe being laid back to 45 degrees. Pt attempting to get oob mulitple times stating I want to sit up, writer placed pt's bed in a sitting position within pt, attempted to throw legs off to the side, stating "I want to get oob, pt's O2 sats >93% with Oxygen via [place]. Elink Artist requesting Ativan, N.O. received for Haldol, pt given Haldol per order with minimal effect. Pt continued with intermittent agitation 1:1 reassurance and teaching without effect, elink notified N.O. received for Ativan 2mg , pt sleeping but wakes up swatting writers hand away when writer attempts to put BIPAP on. Will continue to monitor.

## 2023-09-27 NOTE — Progress Notes (Signed)
NAME:  Crystal Silva, MRN:  [id number], DOB:  12-28-1960, LOS: 3 ADMISSION DATE:  09/24/2023, CONSULTATION DATE:  09/25/2023 REFERRING MD:  Vivi Martens, CHIEF COMPLAINT:  Respiratory Failure    History of Present Illness:  63 year old woman who presented to [facility] 2/14 as a transfer from [facility]) ED for respiratory failure. PMHx significant for bicuspid AV with severe AS (s/p bioprosthetic AVR 2022), COPD (on 2L HOT), tobacco use, RUL pulmonary nodule.   Patient initially presented to [facility] ED with SOB/CP x 2 days. On arrival to ED, she was afebrile, HR 79, BP 161/81, tachypneic to 29-30, SpO2 96%. Reportedly became hypoxic with EMS with associated lethargy and transient bradycardia to 30s. Endorsed SOB/CP, denied other URI symptoms. +Sick contact (grandson). Labs were notable for WBC 3.5, Hgb 15.1, Plt 84 (136 one month PTA). Na 143, K 4.1, CO2 41, Cr 0.40. LFTs grossly WNL. Trop 4 > 6. BNP 50. VBG 7.36/pCO2 94/bicarb 53.0. D-dimer 2.81. LA 1.0. +Flu A positive. CTA Chest 2/13 negative for PE, +mild airspace with tree-in-bud nodular opacities RML, bilateral scattered pulmonary nodules up to 9mm (new from prior), emphysema.   Patient was initially tolerating BiPAP, but clinically decompensated from a respiratory standpoint prompting intubation. Intubated in [facility] ED.   PCCM consulted for ICU admission/transfer.   Pertinent  Medical History  COPD, chronic respiratory failure on 3 L, aortic stenosis status post bioprosthetic valve replacement 05/2021, postoperative atrial fibrillation, tobacco abuse   Significant Hospital Events: Including procedures, antibiotic start and stop dates in addition to other pertinent events   2/13 - Transferred from [facility] to [facility] for higher level of care. Flu A+. Intubated in ED. PCCM consulted for ICU admission.  2/14 extubated late afternoon, overnight had resp distress - placed on bipap, anxiety issues  Interim History / Subjective:   She had agitation  overnight, removed bipap mask repeatedly Not tolerating Bipap this morning either with precedex  ABG this AM pH 7.352, pCO2 82, pO2 118   Subsequently intubated this AM  Objective   Blood pressure 126/77, pulse (!) 58, temperature (!) 96.6 F (35.9 C), resp. rate (!) 22, height 5\' 9"  (1.753 m), weight 71.2 kg, last menstrual period 12/02/2013, SpO2 97%.    Vent Mode: PCV;BIPAP FiO2 (%):  [40 %] 40 % Set Rate:  [15 bmp-22 bmp] 21 bmp PEEP:  [5 cmH20] 5 cmH20 Pressure Support:  [15 cmH20] 15 cmH20   Intake/Output Summary (Last 24 hours) at 09/27/2023 1004 Last data filed at 09/27/2023 0900 Gross per 24 hour  Intake 1399.55 ml  Output 1250 ml  Net 149.55 ml   Filed Weights   09/24/23 1753 09/25/23 0159 09/26/23 0500  Weight: 72.6 kg 71.3 kg 71.2 kg   Examination: General: intubated, sedated HENT: [place]/AT.  Lungs: Decreased breath sounds, mild scattered wheezing Cardiovascular: rrr Abdomen: Soft, ND. Bowel sounds present.  Extremities: Warm. No LE edema bilaterally  Neuro: Unable to assess.  GU: Foley catheter in place   Pertinent Imaging:   CTA 2/13: Mild airspace disease with tree-in-bud nodular opacities in the right middle lobe, likely infectious or inflammatory. Scattered pulmonary nodules bilaterally measuring up to 9 mm, many new from the previous exam.  CXR 2/14: Hyperinflation and pulmonary lucency from COPD. There is no edema, consolidation, effusion, or pneumothorax.  ECHO 2/14: LVEF 60 to 70%, normal LV function, no regional wall motion abnormalities, mild left ventricular hypertrophy, grade 1 diastolic dysfunction.  Normal RV function.  Moderately elevated pulmonary artery systolic pressure.  Resolved Hospital Problem list   N/A   Assessment & Plan:   Acute hypoxemic and hypercarbic respiratory failure  COPD with Acute Exacerbation Flu A infection Pneumonia - stenotrophomonas Presented [facility] ED with SOB/CP x2 days, became hypoxic, tachypneic and had transient  bradycardia to 30s. VBG 7.36/pCO2 94/bicarb 53.0. Influenza A positive. CTA findings of mild airspace with tree-in-bud nodular opacities RML. Placed on BiPAP, but clinically decompensated, required intubation and transferred to [facility] ICU. Extubated 2/14 and has remained on intermittent bipap. Re-intubated this morning.  - continue mechanical ventilatory support - check ABG 1 hr after intubation - MRSA swab negative.  - Tracheal Aspirate growing stenotrophomonas - Bactrim IV started 2/15 - Blood cultures x 2 no growth to date  - Avoid albuterol, results tremors - continue budesonide, brovana and yupelri nebs - Tamiflu  - Solumedrol 125mg  daily, will plan extend oral taper when appropriate  Bicuspid AV Severe AS s/p MVR 05/2021 (bioprosthetic) Previous MVR with Dr. Dorris Fetch 2022, on ASA 325 mg at home.  - ECHO LVEF 65-70% with G1DD (new), no regional wall motion abnormalities.  - resume home Aspirin 325 mg  - resume home metoprolol when able   Tobacco use Pulmonary nodules CTA Chest demonstrating multiple bilateral pulmonary nodules measuring up to 9mm, increased from prior. - Outpatient follow up for lung cancer screening/nodule characterization - Encourage smoking cessation  Chronic thrombocytopenia Acute leukopenia Monitor for any fever curves. On appropriate Abx coverage.  - Trend PLT and WBC   Nutrition Severe Protein Calorie malnutrition - start tube feeds   Best Practice (right click and "Reselect all SmartList Selections" daily)   Diet/type: tubefeeds DVT prophylaxis prophylactic heparin  Pressure ulcer(s): Assessed by RN  GI prophylaxis: PPI Lines: Peripherals  Foley:  Yes, and it is still needed Code Status:  full code Last date of multidisciplinary goals of care discussion [updated daughter Eliseo Gum 2/16 via phone]  Labs   CBC: Recent Labs  Lab 09/24/23 1813 09/25/23 0114 09/25/23 0345 09/25/23 0424 09/26/23 0311 09/26/23 0435 09/26/23 1215  09/27/23 0408 09/27/23 0833  WBC 3.5*  --  3.6*  --  13.5*  --   --  10.8*  --   NEUTROABS  --   --  3.0  --   --   --   --   --   --   HGB 15.1*   < > 14.3   < > 13.2 13.3 13.3 13.2 12.9  HCT 47.9*   < > 44.8   < > 41.1 39.0 39.0 41.6 38.0  MCV 100.2*  --  98.2  --  98.8  --   --  99.8  --   PLT 84*  --  79*  --  103*  --   --  113*  --    < > = values in this interval not displayed.    Basic Metabolic Panel: Recent Labs  Lab 09/24/23 1813 09/24/23 2251 09/25/23 0114 09/25/23 0345 09/25/23 0424 09/26/23 0311 09/26/23 0435 09/26/23 1215 09/27/23 0408 09/27/23 0833  NA 143  --    < > 143   < > 143 140 143 143 141  K 4.1  --    < > 3.0*   < > 5.1 4.8 4.6 5.2* 4.8  CL 91*  --   --  96*  --  100  --   --  96*  --   CO2 41*  --   --  30  --  35*  --   --  37*  --   GLUCOSE 123*  --   --  235*  --  153*  --   --  172*  --   BUN 23  --   --  19  --  34*  --   --  33*  --   CREATININE 0.40*  --   --  0.81  --  0.58  --   --  0.67  --   CALCIUM 9.0  --   --  8.6*  --  8.9  --   --  8.8*  --   MG  --  2.0  --   --   --  2.2  --   --   --   --   PHOS  --  2.8  --   --   --   --   --   --   --   --    < > = values in this interval not displayed.   GFR: Estimated Creatinine Clearance: 77.2 mL/min (by C-G formula based on SCr of 0.67 mg/dL). Recent Labs  Lab 09/24/23 1813 09/24/23 2020 09/24/23 2251 09/25/23 0345 09/26/23 0311 09/27/23 0408  WBC 3.5*  --   --  3.6* 13.5* 10.8*  LATICACIDVEN  --  1.0 1.2  --   --   --     Liver Function Tests: Recent Labs  Lab 09/24/23 1813  AST 19  ALT 14  ALKPHOS 45  BILITOT 0.9  PROT 6.4*  ALBUMIN 3.3*   No results for input(s): "LIPASE", "AMYLASE" in the last 168 hours. No results for input(s): "AMMONIA" in the last 168 hours.  ABG    Component Value Date/Time   PHART 7.352 09/27/2023 0833   PCO2ART 82.2 (HH) 09/27/2023 0833   PO2ART 118 (H) 09/27/2023 0833   HCO3 46.0 (H) 09/27/2023 0833   TCO2 49 (H) 09/27/2023 0833    ACIDBASEDEF 3.0 (H) 05/22/2021 1752   O2SAT 98 09/27/2023 0833     Coagulation Profile: No results for input(s): "INR", "PROTIME" in the last 168 hours.  Cardiac Enzymes: No results for input(s): "CKTOTAL", "CKMB", "CKMBINDEX", "TROPONINI" in the last 168 hours.  HbA1C: Hgb A1c MFr Bld  Date/Time Value Ref Range Status  08/11/2023 12:35 PM 5.6 4.8 - 5.6 % Final    Comment:    (NOTE)         Prediabetes: 5.7 - 6.4         Diabetes: >6.4         Glycemic control for adults with diabetes: <7.0   05/20/2021 02:36 PM 5.6 4.8 - 5.6 % Final    Comment:    (NOTE) Pre diabetes:          5.7%-6.4%  Diabetes:              >6.4%  Glycemic control for   <7.0% adults with diabetes     CBG: Recent Labs  Lab 09/26/23 1551 09/26/23 1938 09/26/23 2308 09/27/23 0342 09/27/23 0728  GLUCAP 118* 151* 97 159* 115*      Critical care time: 45 minutes     Melody Comas, MD [place] [facility] Office: [phone]   See Amion for personal pager PCCM on call pager [phone] until 7pm. Please call Elink 7p-7a. [phone]

## 2023-09-28 DIAGNOSIS — J9601 Acute respiratory failure with hypoxia: Secondary | ICD-10-CM | POA: Diagnosis not present

## 2023-09-28 DIAGNOSIS — J09X1 Influenza due to identified novel influenza A virus with pneumonia: Secondary | ICD-10-CM | POA: Diagnosis not present

## 2023-09-28 DIAGNOSIS — J441 Chronic obstructive pulmonary disease with (acute) exacerbation: Secondary | ICD-10-CM | POA: Diagnosis not present

## 2023-09-28 DIAGNOSIS — J9602 Acute respiratory failure with hypercapnia: Secondary | ICD-10-CM | POA: Diagnosis not present

## 2023-09-28 LAB — CBC
HCT: 38.1 % (ref 36.0–46.0)
Hemoglobin: 12.4 g/dL (ref 12.0–15.0)
MCH: 31.7 pg (ref 26.0–34.0)
MCHC: 32.5 g/dL (ref 30.0–36.0)
MCV: 97.4 fL (ref 80.0–100.0)
Platelets: 126 10*3/uL — ABNORMAL LOW (ref 150–400)
RBC: 3.91 MIL/uL (ref 3.87–5.11)
RDW: 13.8 % (ref 11.5–15.5)
WBC: 9.8 10*3/uL (ref 4.0–10.5)
nRBC: 0 % (ref 0.0–0.2)

## 2023-09-28 LAB — GLUCOSE, CAPILLARY
Glucose-Capillary: 126 mg/dL — ABNORMAL HIGH (ref 70–99)
Glucose-Capillary: 116 mg/dL — ABNORMAL HIGH (ref 70–99)
Glucose-Capillary: 64 mg/dL — ABNORMAL LOW (ref 70–99)
Glucose-Capillary: 90 mg/dL (ref 70–99)
Glucose-Capillary: 91 mg/dL (ref 70–99)
Glucose-Capillary: 84 mg/dL (ref 70–99)
Glucose-Capillary: 109 mg/dL — ABNORMAL HIGH (ref 70–99)

## 2023-09-28 LAB — BASIC METABOLIC PANEL
Anion gap: 10 (ref 5–15)
BUN: 40 mg/dL — ABNORMAL HIGH (ref 8–23)
CO2: 36 mmol/L — ABNORMAL HIGH (ref 22–32)
Calcium: 8.6 mg/dL — ABNORMAL LOW (ref 8.9–10.3)
Chloride: 94 mmol/L — ABNORMAL LOW (ref 98–111)
Creatinine, Ser: 0.61 mg/dL (ref 0.44–1.00)
GFR, Estimated: >60 mL/min (ref >60)
Glucose, Bld: 127 mg/dL — ABNORMAL HIGH (ref 70–99)
Potassium: 4.5 mmol/L (ref 3.5–5.1)
Sodium: 140 mmol/L (ref 135–145)

## 2023-09-28 LAB — PHOSPHORUS: Phosphorus: 2.9 mg/dL (ref 2.5–4.6)

## 2023-09-28 LAB — MAGNESIUM: Magnesium: 2.3 mg/dL (ref 1.7–2.4)

## 2023-09-28 LAB — TRIGLYCERIDES: Triglycerides: 226 mg/dL — ABNORMAL HIGH (ref <150)

## 2023-09-28 MED ORDER — OSMOLITE 1.5 CAL PO LIQD
1000.0000 mL | ORAL | Status: DC
  Filled 2023-09-28 (×3): qty 1000

## 2023-09-28 MED ORDER — DEXTROSE IN LACTATED RINGERS 5 % IV SOLN: INTRAVENOUS | Status: DC

## 2023-09-28 MED ORDER — DEXMEDETOMIDINE HCL IN NACL 400 MCG/100ML IV SOLN: 0.0000 ug/kg/h | INTRAVENOUS | Status: DC

## 2023-09-28 NOTE — Progress Notes (Signed)
NAME:  JARIANA SHUMARD, MRN:  [id number], DOB:  1961/06/08, LOS: 4 ADMISSION DATE:  09/24/2023, CONSULTATION DATE:  09/25/2023 REFERRING MD:  Eloise Harman - EDP, CHIEF COMPLAINT:  respiratory failure    History of Present Illness:  63 year old woman who presented to [facility] 2/14 as a transfer from [facility]) ED for respiratory failure. PMHx significant for bicuspid AV with severe AS (s/p bioprosthetic AVR 2022), COPD (on 2L HOT), tobacco use, RUL pulmonary nodule.   Patient initially presented to [facility] ED with SOB/CP x 2 days. On arrival to ED, she was afebrile, HR 79, BP 161/81, tachypneic to 29-30, SpO2 96%. Reportedly became hypoxic with EMS with associated lethargy and transient bradycardia to 30s. Endorsed SOB/CP, denied other URI symptoms. +Sick contact (grandson). Labs were notable for WBC 3.5, Hgb 15.1, Plt 84 (136 one month PTA). Na 143, K 4.1, CO2 41, Cr 0.40. LFTs grossly WNL. Trop 4 > 6. BNP 50. VBG 7.36/pCO2 94/bicarb 53.0. D-dimer 2.81. LA 1.0. +Flu A positive. CTA Chest 2/13 negative for PE, +mild airspace with tree-in-bud nodular opacities RML, bilateral scattered pulmonary nodules up to 9mm (new from prior), emphysema.   Patient was initially tolerating BiPAP, but clinically decompensated from a respiratory standpoint prompting intubation. Intubated in [facility] ED.   PCCM consulted for ICU admission/transfer.  Pertinent  Medical History  COPD, chronic respiratory failure on 3 L, aortic stenosis status post bioprosthetic valve replacement 05/2021, postoperative atrial fibrillation, tobacco abuse   Significant Hospital Events: Including procedures, antibiotic start and stop dates in addition to other pertinent events   2/13: Transferred from [facility] to [facility] for higher level of care. Flu A+. Intubated in ED. PCCM consulted for ICU admission.  2/14:  extubated late afternoon, overnight had resp distress - placed on bipap, anxiety issues 2/16: Re-intubated d/t hypercarbia and unable to  tolerate BiPAP  2/17: Extubated. Stopped Solumedrol   Interim History / Subjective:  No overnight events  Intubated and on vent. Awake and Alert, responding to commands and moving extremities   Objective   Blood pressure 135/72, pulse 68, temperature 99 F (37.2 C), resp. rate 14, height 5\' 9"  (1.753 m), weight 71.2 kg, last menstrual period 12/02/2013, SpO2 99%.    Vent Mode: PRVC FiO2 (%):  [30 %-40 %] 40 % Set Rate:  [18 bmp] 18 bmp Vt Set:  [520 mL] 520 mL PEEP:  [5 cmH20] 5 cmH20 Plateau Pressure:  [18 cmH20-21 cmH20] 21 cmH20   Intake/Output Summary (Last 24 hours) at 09/28/2023 1047 Last data filed at 09/28/2023 0800 Gross per 24 hour  Intake 1027.6 ml  Output 650 ml  Net 377.6 ml   Filed Weights   09/24/23 1753 09/25/23 0159 09/26/23 0500  Weight: 72.6 kg 71.3 kg 71.2 kg    Examination: General: Awake, intubated and on vent  HENT: NV/AT. ETT.  Lungs: Decrease breath sounds bibasilar, no wheezing  Cardiovascular: RR. No murmur  Abdomen: soft, ND. Bowel sounds present  Extremities: soft, SCDs.  Neuro: Alert.  GU: not assessed.   Resolved Hospital Problem list   N/A   Assessment & Plan:  Acute hypoxemic and hypercarbic respiratory failure  COPD with Acute Exacerbation Flu A infection Pneumonia - stenotrophomonas Presented [facility] ED with SOB/CP x2 days, became hypoxic, tachypneic and had transient bradycardia to 30s. VBG 7.36/pCO2 94/bicarb 53.0. Influenza A positive. CTA findings of mild airspace with tree-in-bud nodular opacities RML. Placed on BiPAP, but clinically decompensated, required intubation and transferred to [facility] ICU. Extubated 2/14 and has  remained on intermittent bipap. Re-intubated this 2/16 due to respiratory distress and unable to tolerate bipap.    - Extubate today with Precedex for anxiety/agitation  - PRN Fent  - BiPAP as needed - MRSA swab negative.  - Tracheal Aspirate growing stenotrophomonas - IV Bactrim started 2/15 - Blood cultures x 2  no growth to date  - Avoid albuterol, results tremors - continue budesonide, brovana and yupelri nebs - Tamiflu  - Stop Solumedrol 125mg  today, can be causing steroid induced psychosis resulting in anxiety and agitation   Bicuspid AV Severe AS s/p MVR 05/2021 (bioprosthetic) Previous MVR with Dr. Dorris Fetch 2022, on ASA 325 mg at home.  - ECHO LVEF 65-70% with G1DD (new), no regional wall motion abnormalities.  - Aspirin 325 mg  - resume home metoprolol when able   Tobacco use Pulmonary nodules CTA Chest demonstrating multiple bilateral pulmonary nodules measuring up to 9mm, increased from prior. - Outpatient follow up for lung cancer screening/nodule characterization - Encourage smoking cessation   Chronic thrombocytopenia Acute leukopenia Monitor for any fever curves. On appropriate Abx coverage.  - Trend PLT and WBC    Nutrition Severe Protein Calorie malnutrition - Tube feeds  Best Practice (right click and "Reselect all SmartList Selections" daily)   Diet/type: tubefeeds DVT prophylaxis prophylactic heparin  Pressure ulcer(s): Assessed by RN GI prophylaxis: PPI Lines: PICC line  Foley:  removal ordered  Code Status:  full code Last date of multidisciplinary goals of care discussion [--]  Labs   CBC: Recent Labs  Lab 09/24/23 1813 09/25/23 0114 09/25/23 0345 09/25/23 0424 09/26/23 0311 09/26/23 0435 09/26/23 1215 09/27/23 0408 09/27/23 0833 09/27/23 1132 09/28/23 0342  WBC 3.5*  --  3.6*  --  13.5*  --   --  10.8*  --   --  9.8  NEUTROABS  --   --  3.0  --   --   --   --   --   --   --   --   HGB 15.1*   < > 14.3   < > 13.2   < > 13.3 13.2 12.9 12.9 12.4  HCT 47.9*   < > 44.8   < > 41.1   < > 39.0 41.6 38.0 38.0 38.1  MCV 100.2*  --  98.2  --  98.8  --   --  99.8  --   --  97.4  PLT 84*  --  79*  --  103*  --   --  113*  --   --  126*   < > = values in this interval not displayed.    Basic Metabolic Panel: Recent Labs  Lab 09/24/23 1813  09/24/23 2251 09/25/23 0114 09/25/23 0345 09/25/23 0424 09/26/23 0311 09/26/23 0435 09/26/23 1215 09/27/23 0408 09/27/23 0833 09/27/23 1132 09/27/23 1845 09/28/23 0342  NA 143  --    < > 143   < > 143   < > 143 143 141 139  --  140  K 4.1  --    < > 3.0*   < > 5.1   < > 4.6 5.2* 4.8 4.8  --  4.5  CL 91*  --   --  96*  --  100  --   --  96*  --   --   --  94*  CO2 41*  --   --  30  --  35*  --   --  37*  --   --   --  36*  GLUCOSE 123*  --   --  235*  --  153*  --   --  172*  --   --   --  127*  BUN 23  --   --  19  --  34*  --   --  33*  --   --   --  40*  CREATININE 0.40*  --   --  0.81  --  0.58  --   --  0.67  --   --   --  0.61  CALCIUM 9.0  --   --  8.6*  --  8.9  --   --  8.8*  --   --   --  8.6*  MG  --  2.0  --   --   --  2.2  --   --  2.4  --   --  2.3 2.3  PHOS  --  2.8  --   --   --   --   --   --  3.4  --   --  2.4* 2.9   < > = values in this interval not displayed.   GFR: Estimated Creatinine Clearance: 77.2 mL/min (by C-G formula based on SCr of 0.61 mg/dL). Recent Labs  Lab 09/24/23 2020 09/24/23 2251 09/25/23 0345 09/26/23 0311 09/27/23 0408 09/28/23 0342  WBC  --   --  3.6* 13.5* 10.8* 9.8  LATICACIDVEN 1.0 1.2  --   --   --   --     Liver Function Tests: Recent Labs  Lab 09/24/23 1813  AST 19  ALT 14  ALKPHOS 45  BILITOT 0.9  PROT 6.4*  ALBUMIN 3.3*   No results for input(s): "LIPASE", "AMYLASE" in the last 168 hours. No results for input(s): "AMMONIA" in the last 168 hours.  ABG    Component Value Date/Time   PHART 7.471 (H) 09/27/2023 1132   PCO2ART 58.5 (H) 09/27/2023 1132   PO2ART 264 (H) 09/27/2023 1132   HCO3 42.9 (H) 09/27/2023 1132   TCO2 45 (H) 09/27/2023 1132   ACIDBASEDEF 3.0 (H) 05/22/2021 1752   O2SAT 100 09/27/2023 1132     Coagulation Profile: No results for input(s): "INR", "PROTIME" in the last 168 hours.  Cardiac Enzymes: No results for input(s): "CKTOTAL", "CKMB", "CKMBINDEX", "TROPONINI" in the last 168  hours.  HbA1C: Hgb A1c MFr Bld  Date/Time Value Ref Range Status  08/11/2023 12:35 PM 5.6 4.8 - 5.6 % Final    Comment:    (NOTE)         Prediabetes: 5.7 - 6.4         Diabetes: >6.4         Glycemic control for adults with diabetes: <7.0   05/20/2021 02:36 PM 5.6 4.8 - 5.6 % Final    Comment:    (NOTE) Pre diabetes:          5.7%-6.4%  Diabetes:              >6.4%  Glycemic control for   <7.0% adults with diabetes     CBG: Recent Labs  Lab 09/27/23 1519 09/27/23 2027 09/27/23 2316 09/28/23 0347 09/28/23 0729  GLUCAP 124* 178* 179* 126* 116*    Review of Systems:   Extubated today. Sedated.   Past Medical History:  She,  has a past medical history of COPD (chronic obstructive pulmonary disease) (HCC), Headache, and Heart murmur.   Surgical History:   Past Surgical History:  Procedure  Laterality Date   AORTIC VALVE REPLACEMENT N/A 05/22/2021   Procedure: AORTIC VALVE REPLACEMENT (AVR) WITH STERNAL PLATING. 23 MM INSPIRIS RESILIA  AORTIC VALVE.;  Surgeon: [name], MD;  Location: [facility] OR;  Service: Open Heart Surgery;  Laterality: N/A;   CHOLECYSTECTOMY     lt knee surgery     RIGHT/LEFT HEART CATH AND CORONARY ANGIOGRAPHY N/A 04/30/2021   Procedure: RIGHT/LEFT HEART CATH AND CORONARY ANGIOGRAPHY;  Surgeon: Marykay Lex, MD;  Location: [facility] INVASIVE CV LAB;  Service: Cardiovascular;  Laterality: N/A;   TEE WITHOUT CARDIOVERSION N/A 05/22/2021   Procedure: TRANSESOPHAGEAL ECHOCARDIOGRAM (TEE);  Surgeon: [name], MD;  Location: [facility] OR;  Service: Open Heart Surgery;  Laterality: N/A;     Social History:   reports that she has been smoking cigarettes. She has a 44 pack-year smoking history. She has never used smokeless tobacco. She reports that she does not currently use alcohol. She reports that she does not use drugs.   Family History:  Her family history includes Heart disease in her mother; Heart failure in her mother.    Allergies No Known Allergies   Home Medications  Prior to Admission medications   Medication Sig Start Date End Date Taking? Authorizing Provider  Albuterol-Budesonide (AIRSUPRA) 90-80 MCG/ACT AERO Inhale 1 puff into the lungs every 4 (four) hours as needed (wheezing, shortness of breath).   Yes [provider]  furosemide (LASIX) 20 MG tablet Take 20 mg by mouth daily.   Yes [provider]  ipratropium-albuterol (DUONEB) 0.5-2.5 (3) MG/3ML SOLN Take 3 mLs by nebulization every 4 (four) hours as needed (Shortness of breath/wheezing). 06/17/22  Yes Glade [name], MD  metoprolol tartrate (LOPRESSOR) 25 MG tablet Take 1 tablet (25 mg total) by mouth 2 (two) times daily. 05/28/21 09/25/23 Yes Netta Neat., NP  sertraline (ZOLOFT) 50 MG tablet Take 50 mg by mouth daily.   Yes [provider]  Dwyane Luo 200-62.5-25 MCG/ACT AEPB Inhale 1 puff into the lungs daily. 04/13/23  Yes [provider]  aspirin EC 325 MG EC tablet Take 1 tablet (325 mg total) by mouth daily. 05/26/21   Rowe Clack, PA-C  Multiple Vitamin (MULTIVITAMIN WITH MINERALS) TABS tablet Take 1 tablet by mouth daily.    [provider]  naproxen sodium (ALEVE) 220 MG tablet Take 220 mg by mouth daily as needed (headache).    [provider]     Critical care time:

## 2023-09-28 NOTE — Progress Notes (Signed)
eLink Physician-Brief Progress Note Patient Name: Crystal Silva DOB: Apr 09, 1961 MRN: [id number]   Date of Service  09/28/2023  HPI/Events of Note  Notified of hypoglycemia.  Pt is currently NPO and had a glucose in the 60s earlier.   eICU Interventions  Start on D5LR.      Intervention Category Intermediate Interventions: Other:  Larinda Buttery 09/28/2023, 9:27 PM

## 2023-09-28 NOTE — Progress Notes (Signed)
SLP Cancellation Note  Patient Details Name: Crystal Silva MRN: [id number] DOB: 11/09/1960   Cancelled treatment- Pt currently intubated. Will s/o and await post-extubation swallow orders if appropriate.  Haile Bosler L. Samson Frederic, MA CCC/SLP Clinical Specialist - Acute Care SLP Acute Rehabilitation Services Office number [phone]            Blenda Mounts Laurice 09/28/2023, 9:00 AM

## 2023-09-28 NOTE — Procedures (Signed)
Extubation Procedure Note  Patient Details:   Name: Crystal Silva DOB: 1961/01/03 MRN: [id number]   Airway Documentation:    Vent end date: 09/28/23 Vent end time: 0928   Evaluation  O2 sats: stable throughout Complications: No apparent complications Patient did tolerate procedure well. Bilateral Breath Sounds: Diminished   Yes  RT extubated pt to 4L [place] per MD order with RN at bedside. Positive cuff leak noted. No stridor or distress noted at this time. RT will continue to monitor as needed.   Jaquelyn Bitter 09/28/2023, 9:33 AM

## 2023-09-28 NOTE — Progress Notes (Signed)
Nutrition Follow-up  DOCUMENTATION CODES:  Not applicable  INTERVENTION:  Recommend placing NG tube and restarting the following if unable to advance diet: Osmolite 1.5 at 45 ml/h (1080 ml per day) Start at 25 and advance by 10mL q8h to goal Prosource TF20 60 ml 1x/d Provides 1700 kcal, 88 gm protein, 823 ml free water daily MVI with minerals daily  NUTRITION DIAGNOSIS:  Increased nutrient needs related to acute illness as evidenced by estimated needs.  GOAL:  Patient will meet greater than or equal to 90% of their needs  MONITOR:  I & O's, Vent status, Labs  REASON FOR ASSESSMENT:  Consult Enteral/tube feeding initiation and management  ASSESSMENT:  Pt with hx of COPD and tobacco abuse (1 PPD) presented to [facility] ED with SOB and chest pain. Found to have Flu A. Intubated in ED and admitted to [facility] ICU.  2/13 - presented to ED, intubated 2/14 - transferred to [facility] ICU, extubated 2/16 - reintubated, PICC placed  2/17 - extubated  Pt resting in bed at the time of assessment with eyes closed. Was extubated this AM. TF protocol started over the weekend when pt was reintubated.   Discussed in rounds, RN has not done bedside swallow yet, states pt is also pretty lethargic. Adjusted TF orders this AM but will leave in place in the event that pt can't swallow.  Admit weight: 72.6 kg  Current weight: 71.2 kg 3% weight loss noted in the last month to admission, not severe  Intake/Output Summary (Last 24 hours) at 09/28/2023 0911 Last data filed at 09/28/2023 0800 Gross per 24 hour  Intake 1027.6 ml  Output 650 ml  Net 377.6 ml  Net IO Since Admission: 1,379.84 mL [09/28/23 0911]  Drains/Lines: PICC triple lumen UOP x 24 hours  Nutritionally Relevant Medications: Scheduled Meds:  docusate  100 mg Per Tube BID   PROSource TF20  60 mL Per Tube Daily   insulin aspart  0-9 Units Subcutaneous Q4H   multivitamin with minerals  1 tablet Per Tube Daily    pantoprazole IV  40 mg Intravenous QHS   polyethylene glycol  17 g Per Tube Daily   sulfamethoxazole-trimethoprim  2 tablet Per Tube Q12H   Continuous Infusions:  feeding supplement (OSMOLITE 1.5 CAL)     propofol (DIPRIVAN) infusion 10 mcg/kg/min (09/28/23 0800)   PRN Meds:.docusate, polyethylene glycol  Labs Reviewed: Chloride 94 CBG ranges from 116-179 mg/dL over the last 24 hours HgbA1c 5.6%  NUTRITION - FOCUSED PHYSICAL EXAM: Flowsheet Row Most Recent Value  Orbital Region Mild depletion  Upper Arm Region No depletion  Thoracic and Lumbar Region No depletion  Buccal Region No depletion  Temple Region Mild depletion  Clavicle Bone Region Mild depletion  Clavicle and Acromion Bone Region Mild depletion  Scapular Bone Region Mild depletion  Dorsal Hand Unable to assess  [mittens]  Patellar Region Severe depletion  Anterior Thigh Region Severe depletion  Posterior Calf Region Moderate depletion  Edema (RD Assessment) None  Hair Reviewed  Eyes Reviewed  Mouth Reviewed  Skin Reviewed  Nails Reviewed   Diet Order:   Diet Order             Diet NPO time specified  Diet effective now                   EDUCATION NEEDS:  Not appropriate for education at this time  Skin:  Skin Assessment: Reviewed RN Assessment Stage 1: - Sacrum (2 x 2  cm)  Last BM:  2/17 - type 6  Height:  Ht Readings from Last 1 Encounters:  09/24/23 5\' 9"  (1.753 m)   Weight:  Wt Readings from Last 1 Encounters:  09/26/23 71.2 kg   Ideal Body Weight:  65.9 kg  BMI:  Body mass index is 23.18 kg/m.  Estimated Nutritional Needs:  Kcal:  1600-1800 kcal/d Protein:  80-100g/d Fluid:  1.8-2L/d    Greig Castilla, RD, LDN Registered Dietitian II Please reach out via secure chat Weekend on-call pager # available in [facility]

## 2023-09-28 NOTE — Evaluation (Signed)
Physical Therapy Evaluation Patient Details Name: Crystal Silva MRN: [id number] DOB: 1961-07-16 Today's Date: 09/28/2023  History of Present Illness  63 y.o. female presents to [facility] hospital on 2/14 as a transfer from [facility] due to respiratory failure. Pt intubated in [facility] ED, extubated on 2/14 but requiring re-intubation on 2/16. PMH includes bicuspid AV with severe AS, COPD, RUL pulmonary nodule.  Clinical Impression  Pt presents to PT with deficits in cognition, communication, strength, power, endurance, gait, balance. Pt is impulsive during session, often mobilizing to the edge of bed despite cues from PT to hold mobility until line management is complete. Pt benefits from assistance for stability in standing, and demonstrates poor tolerance for standing activities at this time, unable to progress to ambulation. PT is hopeful that if the pt's cognition improves that her mobility will also. PT to follow with continued assessment of discharge plans.        If plan is discharge home, recommend the following: A little help with walking and/or transfers;A little help with bathing/dressing/bathroom;Assistance with cooking/housework;Direct supervision/assist for medications management;Direct supervision/assist for financial management;Assist for transportation;Help with stairs or ramp for entrance;Supervision due to cognitive status   Can travel by private vehicle        Equipment Recommendations  (TBD pending progress)  Recommendations for Other Services       Functional Status Assessment Patient has had a recent decline in their functional status and demonstrates the ability to make significant improvements in function in a reasonable and predictable amount of time.     Precautions / Restrictions Precautions Precautions: Fall Recall of Precautions/Restrictions: Impaired Precaution/Restrictions Comments: bilateral wrist restraints Restrictions Weight Bearing Restrictions Per  Provider Order: No      Mobility  Bed Mobility Overal bed mobility: Needs Assistance Bed Mobility: Supine to Sit, Sit to Supine     Supine to sit: Contact guard Sit to supine: Min assist        Transfers Overall transfer level: Needs assistance Equipment used: None Transfers: Sit to/from Stand Sit to Stand: Min assist                Ambulation/Gait Ambulation/Gait assistance:  (poor tolerance for standing, unable to initiate ambulation at this time)                Careers information officer     Tilt Bed    Modified Rankin (Stroke Patients Only)       Balance Overall balance assessment: Needs assistance Sitting-balance support: Feet supported, Single extremity supported Sitting balance-Leahy Scale: Fair     Standing balance support: Single extremity supported, Reliant on assistive device for balance Standing balance-Leahy Scale: Poor                               Pertinent Vitals/Pain Pain Assessment Pain Assessment: CPOT Facial Expression: Relaxed, neutral Body Movements: Restlessness Muscle Tension: Relaxed Compliance with ventilator (intubated pts.): N/A Vocalization (extubated pts.): Talking in normal tone or no sound CPOT Total: 2    Home Living Family/patient expects to be discharged to:: Private residence Living Arrangements: Other relatives (grandson) Available Help at Discharge: Family;Available PRN/intermittently Type of Home: House Home Access: Level entry       Home Layout: One level   Additional Comments: pt is unable to report bathroom setup or DME availability    Prior Function Prior Level of Function :  Independent/Modified Independent             Mobility Comments: pt reports independence but does not answer whether she utilizes DME or if she has endurance limitations chronically       Extremity/Trunk Assessment   Upper Extremity Assessment Upper Extremity Assessment:  Generalized weakness;Difficult to assess due to impaired cognition    Lower Extremity Assessment Lower Extremity Assessment: Generalized weakness;Difficult to assess due to impaired cognition    Cervical / Trunk Assessment Cervical / Trunk Assessment: Kyphotic  Communication   Communication Communication: Impaired Factors Affecting Communication: Difficulty expressing self    Cognition Arousal: Alert Behavior During Therapy: Impulsive   PT - Cognitive impairments: Awareness, Memory, Orientation, Attention, Problem solving, Safety/Judgement   Orientation impairments: Person                   PT - Cognition Comments: pt is impulsive, often transitioning from supine to sitting despite PT cues to wait to initiate mobility Following commands: Impaired Following commands impaired: Follows one step commands with increased time     Cueing Cueing Techniques: Verbal cues, Tactile cues     General Comments General comments (skin integrity, edema, etc.): VSS on 4L [place], brief priod of tachycardia into 120s but otherwise HR in 90s    Exercises     Assessment/Plan    PT Assessment Patient needs continued PT services  PT Problem List Decreased strength;Decreased activity tolerance;Decreased balance;Decreased mobility;Decreased cognition;Decreased knowledge of use of DME;Decreased safety awareness;Decreased knowledge of precautions;Cardiopulmonary status limiting activity       PT Treatment Interventions DME instruction;Gait training;Stair training;Functional mobility training;Therapeutic activities;Therapeutic exercise;Balance training;Neuromuscular re-education;Cognitive remediation;Patient/family education    PT Goals (Current goals can be found in the Care Plan section)  Acute Rehab PT Goals Patient Stated Goal: to return to baseline PT Goal Formulation: With patient Time For Goal Achievement: 10/12/23 Potential to Achieve Goals: Fair Additional Goals Additional Goal #1:  Pt will report 1/4 DOE or less when ambulating for >100' to demonstrate improved activity tolerance    Frequency Min 1X/week     Co-evaluation               AM-PAC PT "6 Clicks" Mobility  Outcome Measure Help needed turning from your back to your side while in a flat bed without using bedrails?: A Little Help needed moving from lying on your back to sitting on the side of a flat bed without using bedrails?: A Little Help needed moving to and from a bed to a chair (including a wheelchair)?: A Lot Help needed standing up from a chair using your arms (e.g., wheelchair or bedside chair)?: A Little Help needed to walk in hospital room?: Total Help needed climbing 3-5 steps with a railing? : Total 6 Click Score: 13    End of Session Equipment Utilized During Treatment: Oxygen Activity Tolerance: Other (comment);Patient limited by fatigue (limited by impaired cognition) Patient left: in bed;with call bell/phone within reach;with bed alarm set;with restraints reapplied Nurse Communication: Mobility status PT Visit Diagnosis: Other abnormalities of gait and mobility (R26.89);Muscle weakness (generalized) (M62.81)    Time: 1610-9604 PT Time Calculation (min) (ACUTE ONLY): 23 min   Charges:   PT Evaluation $PT Eval Low Complexity: 1 Low   PT General Charges $$ ACUTE PT VISIT: 1 Visit         Arlyss Gandy, PT, DPT Acute Rehabilitation Office [phone]   Arlyss Gandy 09/28/2023, 2:37 PM

## 2023-09-28 NOTE — TOC CM/SW Note (Signed)
Transition of Care [facility]) - Inpatient Brief Assessment   Patient Details  Name: Crystal Silva MRN: [id number] Date of Birth: [birth year]  Transition of Care [facility]) CM/SW Contact:    Harriet Masson, RN Phone Number: 09/28/2023, 12:46 PM   Clinical Narrative: Patient admitted for respiratory failure.    Transition of Care Asessment:      NCM unable to assess patient due to intubation at this time. Patient not medically stable for discharge.  NCM will continue to follow as patient progresses with care towards discharge.

## 2023-09-29 DIAGNOSIS — J9602 Acute respiratory failure with hypercapnia: Secondary | ICD-10-CM | POA: Diagnosis not present

## 2023-09-29 DIAGNOSIS — J9601 Acute respiratory failure with hypoxia: Secondary | ICD-10-CM | POA: Diagnosis not present

## 2023-09-29 DIAGNOSIS — J441 Chronic obstructive pulmonary disease with (acute) exacerbation: Secondary | ICD-10-CM | POA: Diagnosis not present

## 2023-09-29 DIAGNOSIS — J09X1 Influenza due to identified novel influenza A virus with pneumonia: Secondary | ICD-10-CM | POA: Diagnosis not present

## 2023-09-29 LAB — GLUCOSE, CAPILLARY
Glucose-Capillary: 103 mg/dL — ABNORMAL HIGH (ref 70–99)
Glucose-Capillary: 108 mg/dL — ABNORMAL HIGH (ref 70–99)
Glucose-Capillary: 110 mg/dL — ABNORMAL HIGH (ref 70–99)
Glucose-Capillary: 111 mg/dL — ABNORMAL HIGH (ref 70–99)
Glucose-Capillary: 116 mg/dL — ABNORMAL HIGH (ref 70–99)
Glucose-Capillary: 95 mg/dL (ref 70–99)

## 2023-09-29 LAB — CBC
HCT: 37.7 % (ref 36.0–46.0)
Hemoglobin: 12 g/dL (ref 12.0–15.0)
MCH: 31.7 pg (ref 26.0–34.0)
MCHC: 31.8 g/dL (ref 30.0–36.0)
MCV: 99.7 fL (ref 80.0–100.0)
Platelets: 112 10*3/uL — ABNORMAL LOW (ref 150–400)
RBC: 3.78 MIL/uL — ABNORMAL LOW (ref 3.87–5.11)
RDW: 14 % (ref 11.5–15.5)
WBC: 8.5 10*3/uL (ref 4.0–10.5)
nRBC: 0 % (ref 0.0–0.2)

## 2023-09-29 LAB — CULTURE, BLOOD (ROUTINE X 2)
Culture: NO GROWTH
Culture: NO GROWTH
Special Requests: ADEQUATE

## 2023-09-29 LAB — BASIC METABOLIC PANEL
Anion gap: 12 (ref 5–15)
BUN: 29 mg/dL — ABNORMAL HIGH (ref 8–23)
CO2: 32 mmol/L (ref 22–32)
Calcium: 8.4 mg/dL — ABNORMAL LOW (ref 8.9–10.3)
Chloride: 97 mmol/L — ABNORMAL LOW (ref 98–111)
Creatinine, Ser: 0.59 mg/dL (ref 0.44–1.00)
GFR, Estimated: >60 mL/min (ref >60)
Glucose, Bld: 383 mg/dL — ABNORMAL HIGH (ref 70–99)
Potassium: 4.1 mmol/L (ref 3.5–5.1)
Sodium: 141 mmol/L (ref 135–145)

## 2023-09-29 MED ORDER — OSELTAMIVIR PHOSPHATE 75 MG PO CAPS
75.0000 mg | ORAL_CAPSULE | Freq: Two times a day (BID) | ORAL | Status: DC
  Filled 2023-09-29: qty 1

## 2023-09-29 MED ORDER — DOCUSATE SODIUM 50 MG/5ML PO LIQD
100.0000 mg | Freq: Two times a day (BID) | ORAL | Status: DC
  Administered 2023-09-29: 100 mg via ORAL

## 2023-09-29 MED ORDER — POLYETHYLENE GLYCOL 3350 17 G PO PACK
17.0000 g | PACK | Freq: Every day | ORAL | Status: DC
  Administered 2023-09-29: 17 g via ORAL

## 2023-09-29 MED ORDER — DOCUSATE SODIUM 50 MG/5ML PO LIQD: 100.0000 mg | Freq: Two times a day (BID) | ORAL | Status: DC | PRN

## 2023-09-29 MED ORDER — ASPIRIN 325 MG PO TABS
325.0000 mg | ORAL_TABLET | Freq: Every day | ORAL | Status: DC
  Administered 2023-09-30 – 2023-10-05 (×6): 325 mg via ORAL
  Filled 2023-09-29 (×6): qty 1

## 2023-09-29 MED ORDER — SERTRALINE HCL 50 MG PO TABS
25.0000 mg | ORAL_TABLET | Freq: Every day | ORAL | Status: DC
  Administered 2023-09-30: 25 mg via ORAL
  Filled 2023-09-29: qty 1

## 2023-09-29 MED ORDER — SENNOSIDES-DOCUSATE SODIUM 8.6-50 MG PO TABS
2.0000 | ORAL_TABLET | Freq: Every day | ORAL | Status: DC
  Administered 2023-09-30: 2 via ORAL
  Filled 2023-09-29 (×6): qty 2

## 2023-09-29 MED ORDER — ORAL CARE MOUTH RINSE: 15.0000 mL | OROMUCOSAL | Status: DC | PRN

## 2023-09-29 MED ORDER — SULFAMETHOXAZOLE-TRIMETHOPRIM 800-160 MG PO TABS
2.0000 | ORAL_TABLET | Freq: Two times a day (BID) | ORAL | Status: AC
  Administered 2023-09-29 – 2023-10-03 (×9): 2 via ORAL
  Filled 2023-09-29 (×10): qty 2

## 2023-09-29 MED ORDER — OSELTAMIVIR PHOSPHATE 6 MG/ML PO SUSR
75.0000 mg | Freq: Two times a day (BID) | ORAL | Status: DC
  Filled 2023-09-29 (×2): qty 12.5

## 2023-09-29 MED ORDER — POLYETHYLENE GLYCOL 3350 17 G PO PACK: 17.0000 g | PACK | Freq: Every day | ORAL | Status: DC | PRN

## 2023-09-29 MED ORDER — ADULT MULTIVITAMIN W/MINERALS CH
1.0000 | ORAL_TABLET | Freq: Every day | ORAL | Status: DC
  Administered 2023-09-30 – 2023-10-05 (×6): 1 via ORAL
  Filled 2023-09-29 (×6): qty 1

## 2023-09-29 MED ORDER — OSELTAMIVIR PHOSPHATE 75 MG PO CAPS
75.0000 mg | ORAL_CAPSULE | Freq: Two times a day (BID) | ORAL | Status: AC
  Administered 2023-09-29 (×2): 75 mg via ORAL
  Filled 2023-09-29 (×2): qty 1

## 2023-09-29 MED ORDER — ENSURE ENLIVE PO LIQD
237.0000 mL | Freq: Two times a day (BID) | ORAL | Status: DC
  Administered 2023-09-29 – 2023-10-02 (×7): 237 mL via ORAL

## 2023-09-29 NOTE — Progress Notes (Signed)
Inpatient Rehab Admissions Coordinator:    I spoke with Pt. Regarding potential CIR admit. She states she lives alone with her 62 year old grandson. States her daughters work, but I will reach out to them to see if they can provide some support at d/c.  Thanks,   Vernona Rieger

## 2023-09-29 NOTE — Plan of Care (Signed)
Received care of pt in bed alert and oriented x3, oriented to self, time, and place. Denies any c/o pain or discomfort. Continues on 4L of O2 via [place] to maintain sats, VSS, afebrile. Pt calm and cooperative throughout shift. Meds as ordered. No s/s of distress, will continue to monitor.

## 2023-09-29 NOTE — Progress Notes (Signed)
Physical Therapy Treatment Patient Details Name: Crystal Silva MRN: [id number] DOB: 25-Jan-1961 Today's Date: 09/29/2023   History of Present Illness 63 y.o. female presents to [facility] hospital on 2/14 as a transfer from [facility] due to respiratory failure. Pt intubated in [facility] ED, extubated on 2/14 but requiring re-intubation on 2/16. Extubated 2/17. PMH includes bicuspid AV with severe AS, COPD, RUL pulmonary nodule.    PT Comments  Re-intubated and extubated again since initial PT visit. Progress towards functional goals slower than anticipated therefore requesting CIR to assess for candidacy in post-acute rehab. Pt able to stand with min assist and ambulate short distance (15') with RW and min assist, +2 for chair follow. Easily fatigued. SpO2 90-92% on 4L supplemental O2. 2/3 dyspnea, HR to 110s. Recovery slowly with seated rest break. Anxious, still confused. Patient will benefit from intensive inpatient follow-up therapy, >3 hours/day Patient will continue to benefit from skilled physical therapy services to further improve independence with functional mobility.     If plan is discharge home, recommend the following: A little help with walking and/or transfers;A little help with bathing/dressing/bathroom;Assistance with cooking/housework;Direct supervision/assist for medications management;Direct supervision/assist for financial management;Assist for transportation;Help with stairs or ramp for entrance;Supervision due to cognitive status   Can travel by private vehicle        Equipment Recommendations   (TBD pending progress)    Recommendations for Other Services       Precautions / Restrictions Precautions Precautions: Fall Recall of Precautions/Restrictions: Impaired Restrictions Weight Bearing Restrictions Per Provider Order: No     Mobility  Bed Mobility               General bed mobility comments: in recliner    Transfers Overall transfer level: Needs  assistance Equipment used: None Transfers: Sit to/from Stand Sit to Stand: Min assist, +2 safety/equipment           General transfer comment: Min assist for boost to stand +2 for equipment/safety. Verbal and tactile cues to facilitate, poor recall of set-up after instruction. Practiced from recliner x2.    Ambulation/Gait Ambulation/Gait assistance: Min assist, +2 safety/equipment Gait Distance (Feet): 15 Feet Assistive device: Rolling walker (2 wheels) Gait Pattern/deviations: Step-through pattern, Decreased stride length, Scissoring, Trunk flexed, Narrow base of support Gait velocity: dec Gait velocity interpretation: <1.31 ft/sec, indicative of household ambulator   General Gait Details: On 4L supplemental O2, sats 90-92%, 2/3 dyspnea, HR 110s. Able to ambulate 15 feet with min assist to control RW and assist with balance, easily fatigued and anxious but without overt LE buckling. +2 follow with chair for safety. Required encouragement to continue distance which she completed to target. Cues required for sequencing and to avoid scissoring, to remain close to BOS of RW.   Stairs             Wheelchair Mobility     Tilt Bed    Modified Rankin (Stroke Patients Only)       Balance Overall balance assessment: Needs assistance Sitting-balance support: Feet supported, No upper extremity supported Sitting balance-Leahy Scale: Fair Sitting balance - Comments: tripoding in sitting   Standing balance support: Bilateral upper extremity supported, Reliant on assistive device for balance Standing balance-Leahy Scale: Poor Standing balance comment: hands on RW                            Communication Communication Communication: Impaired Factors Affecting Communication: Difficulty expressing self  Cognition Arousal:  Alert Behavior During Therapy: Flat affect, Anxious   PT - Cognitive impairments: Memory, Attention, Problem solving, Safety/Judgement,  Initiation, Sequencing                       PT - Cognition Comments: Cues to initiate, poor short term recall, Following commands: Impaired Following commands impaired: Follows one step commands with increased time    Cueing Cueing Techniques: Verbal cues, Tactile cues  Exercises      General Comments        Pertinent Vitals/Pain Pain Assessment Pain Assessment: No/denies pain    Home Living                          Prior Function            PT Goals (current goals can now be found in the care plan section) Acute Rehab PT Goals Patient Stated Goal: to return to baseline PT Goal Formulation: With patient Time For Goal Achievement: 10/12/23 Potential to Achieve Goals: Fair Progress towards PT goals: Progressing toward goals    Frequency    Min 1X/week      PT Plan      Co-evaluation              AM-PAC PT "6 Clicks" Mobility   Outcome Measure  Help needed turning from your back to your side while in a flat bed without using bedrails?: A Little Help needed moving from lying on your back to sitting on the side of a flat bed without using bedrails?: A Little Help needed moving to and from a bed to a chair (including a wheelchair)?: A Lot Help needed standing up from a chair using your arms (e.g., wheelchair or bedside chair)?: A Little Help needed to walk in hospital room?: A Lot Help needed climbing 3-5 steps with a railing? : Total 6 Click Score: 14    End of Session Equipment Utilized During Treatment: Oxygen Activity Tolerance: Patient limited by fatigue Patient left: with call bell/phone within reach;in chair;with chair alarm set;with nursing/sitter in room Nurse Communication: Mobility status PT Visit Diagnosis: Other abnormalities of gait and mobility (R26.89);Muscle weakness (generalized) (M62.81)     Time: 1324-4010 PT Time Calculation (min) (ACUTE ONLY): 17 min  Charges:    $Gait Training: 8-22 mins PT General  Charges $$ ACUTE PT VISIT: 1 Visit                     Kathlyn Sacramento, PT, DPT [facility]  Rehabilitation Services Physical Therapist Office: [phone] Website: [email or web address]    Berton Mount 09/29/2023, 2:23 PM

## 2023-09-29 NOTE — Evaluation (Signed)
Clinical/Bedside Swallow Evaluation Patient Details  Name: Crystal Silva MRN: [id number] Date of Birth: 1960/11/15  Today's Date: 09/29/2023 Time: SLP Start Time (ACUTE ONLY): 8295 SLP Stop Time (ACUTE ONLY): 0850 SLP Time Calculation (min) (ACUTE ONLY): 12 min  Past Medical History:  Past Medical History:  Diagnosis Date   COPD (chronic obstructive pulmonary disease) (HCC)    Headache    Heart murmur    Past Surgical History:  Past Surgical History:  Procedure Laterality Date   AORTIC VALVE REPLACEMENT N/A 05/22/2021   Procedure: AORTIC VALVE REPLACEMENT (AVR) WITH STERNAL PLATING. 23 MM INSPIRIS RESILIA  AORTIC VALVE.;  Surgeon: [name], MD;  Location[facility] OR;  Service: Open Heart Surgery;  Laterality: N/A;   CHOLECYSTECTOMY     lt knee surgery     RIGHT/LEFT HEART CATH AND CORONARY ANGIOGRAPHY N/A 04/30/2021   Procedure: RIGHT/LEFT HEART CATH AND CORONARY ANGIOGRAPHY;  Surgeon: Marykay Lex, MD;  Location: [facility] INVASIVE CV LAB;  Service: Cardiovascular;  Laterality: N/A;   TEE WITHOUT CARDIOVERSION N/A 05/22/2021   Procedure: TRANSESOPHAGEAL ECHOCARDIOGRAM (TEE);  Surgeon: [name], MD;  Location: [facility] OR;  Service: Open Heart Surgery;  Laterality: N/A;   HPI:  Crystal Silva who presented to [facility] 2/14 as a transfer from [facility]) ED for respiratory failure. Flu A positive.  CTA Chest 2/13 negative for PE, +mild airspace with tree-in-bud nodular opacities RML, bilateral scattered pulmonary nodules up to 9mm (new from prior), emphysema. Patient was initially tolerating BiPAP, but clinically decompensated from a respiratory standpoint prompting intubation. Intubated in [facility] ED 2/13, extubated 2/14; reintubated 2/16-17. PMHx significant for bicuspid AV with severe AS (s/p bioprosthetic AVR 2022), COPD (on 2L HOT), tobacco use, RUL pulmonary nodule.    Assessment / Plan / Recommendation  Clinical Impression  Pt presents with a mild oral  dysphagia which is suspected to be at least partly impacted by inattention.  There were no clinical s/s of aspiration with any consistencies trialed, including serial straw sips of thin liquid.  Pt's vocal quality is rough/harsh with low vocal intensity and low frequency.  Pt states that this is consistent with her baseline vocal quality and has not changed with intubation.  Pt exhibited prolonged oral phase with regular solid without dentures in place.  With soft solid oral phase was more effcient.  Dentures were located in room and placed. She continued to have prolonged oral phase with regular solid.  Pt benefited from liquid wash.  She was inattentive to bolus and required reminders to continue mastication. Pt appears safe to initiate a PO diet.  Hopefully to advance to regular texture with improvement in attention and mentation.    Recommend mechanical soft solids with thin liquids.   SLP Visit Diagnosis: Dysphagia, unspecified (R13.10)    Aspiration Risk  Mild aspiration risk    Diet Recommendation Dysphagia 3 (Mech soft);Thin liquid    Medication Administration: Whole meds with liquid Supervision: Staff to assist with self feeding;Intermittent supervision to cue for compensatory strategies Compensations: Slow rate;Small sips/bites Postural Changes: Seated upright at 90 degrees    Other  Recommendations      Recommendations for follow up therapy are one component of a multi-disciplinary discharge planning process, led by the attending physician.  Recommendations may be updated based on patient status, additional functional criteria and insurance authorization.  Follow up Recommendations  (TBD, but hopefully to resolve during admission)      Assistance Recommended at Discharge  N/A  Functional  Status Assessment Patient has had a recent decline in their functional status and demonstrates the ability to make significant improvements in function in a reasonable and predictable amount of  time.  Frequency and Duration min 2x/week  2 weeks       Prognosis Prognosis for improved oropharyngeal function: Good      Swallow Study   General Date of Onset: 09/24/23 HPI: Crystal Silva who presented to [name] 2/14 as a transfer from [facility]) ED for respiratory failure. Flu A positive.  CTA Chest 2/13 negative for PE, +mild airspace with tree-in-bud nodular opacities RML, bilateral scattered pulmonary nodules up to 9mm (new from prior), emphysema. Patient was initially tolerating BiPAP, but clinically decompensated from a respiratory standpoint prompting intubation. Intubated in [facility] ED 2/13, extubated 2/14; reintubated 2/16-17. PMHx significant for bicuspid AV with severe AS (s/p bioprosthetic AVR 2022), COPD (on 2L HOT), tobacco use, RUL pulmonary nodule. Type of Study: Bedside Swallow Evaluation Previous Swallow Assessment: none Diet Prior to this Study: NPO Temperature Spikes Noted: No Respiratory Status: Nasal cannula History of Recent Intubation: Yes Total duration of intubation (days): 4 days (2 intubations) Date extubated: 09/28/23 Behavior/Cognition: Alert;Cooperative Oral Cavity Assessment: Within Functional Limits Oral Care Completed by SLP: No Oral Cavity - Dentition: Dentures, top;Missing dentition Self-Feeding Abilities: Needs assist Patient Positioning: Upright in bed Baseline Vocal Quality: Low vocal intensity;Hoarse (Low frequency, rough quality) Volitional Cough: Strong Volitional Swallow: Able to elicit    Oral/Motor/Sensory Function Overall Oral Motor/Sensory Function: Mild impairment Facial ROM: Reduced right Facial Symmetry: Within Functional Limits Lingual Symmetry: Abnormal symmetry right (? slight protrusion to R) Lingual Strength: Within Functional Limits Velum: Within Functional Limits Mandible: Within Functional Limits   Ice Chips Ice chips: Not tested   Thin Liquid Thin Liquid: Within functional limits Presentation: Straw     Nectar Thick Nectar Thick Liquid: Not tested   Honey Thick Honey Thick Liquid: Not tested   Puree Puree: Within functional limits Presentation: Spoon   Solid     Solid: Impaired Oral Phase Functional Implications: Prolonged oral transit;Oral residue      Kerrie Pleasure, MA, CCC-SLP Acute Rehabilitation Services Office: [phone] 09/29/2023,9:04 AM

## 2023-09-29 NOTE — Evaluation (Signed)
Occupational Therapy Evaluation Patient Details Name: Crystal Silva MRN: [id number] DOB: 28-Jul-1961 Today's Date: 09/29/2023   History of Present Illness   63 y.o. female presents to [facility] hospital on 2/14 as a transfer from [facility] due to respiratory failure. Pt intubated in [facility] ED, extubated on 2/14 but requiring re-intubation on 2/16. PMH includes bicuspid AV with severe AS, COPD, RUL pulmonary nodule.     Clinical Impressions Pt reports ind at baseline with ADLs and functional mobility, lives with her grandson at home. Pt currently needing set up - mod A for ADLs, CGA for bed mobility and min A for transfers with 1 person HHA. Pt needing significantly increased time to perform BADL and mobility tasks, decr cognition with inability to recall some home set up/PLOF info. Pt presenting with impairments listed below, will follow acutely. Patient will benefit from intensive inpatient follow-up therapy, >3 hours/day to maximize safety/ind with ADL/functional mobility.      If plan is discharge home, recommend the following:   A little help with walking and/or transfers;A lot of help with bathing/dressing/bathroom;Assistance with cooking/housework;Direct supervision/assist for medications management;Direct supervision/assist for financial management;Assist for transportation;Help with stairs or ramp for entrance;Supervision due to cognitive status     Functional Status Assessment   Patient has had a recent decline in their functional status and demonstrates the ability to make significant improvements in function in a reasonable and predictable amount of time.     Equipment Recommendations   BSC/3in1     Recommendations for Other Services   PT consult     Precautions/Restrictions   Precautions Precautions: Fall Recall of Precautions/Restrictions: Impaired Restrictions Weight Bearing Restrictions Per Provider Order: No     Mobility Bed Mobility Overal bed  mobility: Needs Assistance Bed Mobility: Supine to Sit, Sit to Supine     Supine to sit: Contact guard          Transfers Overall transfer level: Needs assistance Equipment used: None Transfers: Sit to/from Stand Sit to Stand: Min assist                  Balance Overall balance assessment: Needs assistance Sitting-balance support: Feet supported, Single extremity supported Sitting balance-Leahy Scale: Fair Sitting balance - Comments: tripoding in sitting   Standing balance support: During functional activity, Single extremity supported Standing balance-Leahy Scale: Poor                             ADL either performed or assessed with clinical judgement   ADL Overall ADL's : Needs assistance/impaired Eating/Feeding: Set up;Sitting   Grooming: Set up;Sitting   Upper Body Bathing: Minimal assistance   Lower Body Bathing: Moderate assistance   Upper Body Dressing : Minimal assistance   Lower Body Dressing: Moderate assistance   Toilet Transfer: Minimal assistance;Stand-pivot;BSC/3in1   Toileting- Clothing Manipulation and Hygiene: Moderate assistance       Functional mobility during ADLs: Minimal assistance       Vision   Vision Assessment?: No apparent visual deficits     Perception Perception: Not tested       Praxis Praxis: Not tested       Pertinent Vitals/Pain Pain Assessment Pain Assessment: No/denies pain     Extremity/Trunk Assessment Upper Extremity Assessment Upper Extremity Assessment: Generalized weakness   Lower Extremity Assessment Lower Extremity Assessment: Defer to PT evaluation   Cervical / Trunk Assessment Cervical / Trunk Assessment: Kyphotic   Communication Communication Communication: Impaired  Factors Affecting Communication: Difficulty expressing self   Cognition Arousal: Alert Behavior During Therapy: Flat affect Cognition: Cognition impaired   Orientation impairments: Time,  Situation Awareness: Online awareness impaired, Intellectual awareness intact Memory impairment (select all impairments): Declarative long-term memory, Short-term memory, Working memory Attention impairment (select first level of impairment): Alternating attention Executive functioning impairment (select all impairments): Problem solving, Initiation OT - Cognition Comments: significantly incr time needed to initiate basic mobility and ADL tasks, pt does not recall some PLOF info                 Following commands: Impaired Following commands impaired: Follows one step commands with increased time     Cueing  General Comments   Cueing Techniques: Verbal cues;Tactile cues      Exercises     Shoulder Instructions      Home Living Family/patient expects to be discharged to:: Private residence Living Arrangements: Other relatives (grandson, 62 y/o) Available Help at Discharge: Family;Available 24 hours/day Type of Home: House Home Access: Level entry     Home Layout: One level     Bathroom Shower/Tub: Walk-in shower         Home Equipment: BSC/3in1          Prior Functioning/Environment Prior Level of Function : Independent/Modified Independent             Mobility Comments: ind ADLs Comments: ind    OT Problem List: Decreased strength;Decreased range of motion;Decreased activity tolerance;Impaired balance (sitting and/or standing);Decreased coordination;Decreased cognition;Decreased safety awareness;Cardiopulmonary status limiting activity   OT Treatment/Interventions: Self-care/ADL training;Therapeutic exercise;Energy conservation;DME and/or AE instruction;Therapeutic activities;Patient/family education;Balance training      OT Goals(Current goals can be found in the care plan section)   Acute Rehab OT Goals Patient Stated Goal: none stated OT Goal Formulation: With patient Time For Goal Achievement: 10/13/23 Potential to Achieve Goals: Good ADL  Goals Pt Will Perform Upper Body Dressing: with modified independence;sitting Pt Will Perform Lower Body Dressing: with modified independence;sit to/from stand;sitting/lateral leans Pt Will Transfer to Toilet: with modified independence;ambulating;regular height toilet Additional ADL Goal #1: pt will follow 2 step command in prep for ADLs   OT Frequency:  Min 1X/week    Co-evaluation              AM-PAC OT "6 Clicks" Daily Activity     Outcome Measure Help from another person eating meals?: A Little Help from another person taking care of personal grooming?: A Little Help from another person toileting, which includes using toliet, bedpan, or urinal?: A Lot Help from another person bathing (including washing, rinsing, drying)?: A Lot Help from another person to put on and taking off regular upper body clothing?: A Little Help from another person to put on and taking off regular lower body clothing?: A Lot 6 Click Score: 15   End of Session Equipment Utilized During Treatment: Gait belt;Oxygen Nurse Communication: Mobility status  Activity Tolerance: Patient tolerated treatment well Patient left: in chair;with call bell/phone within reach;with chair alarm set  OT Visit Diagnosis: Unsteadiness on feet (R26.81);Other abnormalities of gait and mobility (R26.89);Muscle weakness (generalized) (M62.81);Other symptoms and signs involving cognitive function                Time: 2841-3244 OT Time Calculation (min): 33 min Charges:  OT General Charges $OT Visit: 1 Visit OT Evaluation $OT Eval Moderate Complexity: 1 Mod OT Treatments $Self Care/Home Management : 8-22 mins  Buna Cuppett K, OTD, OTR/L SecureChat Preferred Acute Rehab [phone]   Dalphine Handing 09/29/2023, 12:04 PM

## 2023-09-29 NOTE — Progress Notes (Signed)

## 2023-09-29 NOTE — Progress Notes (Addendum)
NAME:  REGINALD MANGELS, MRN:  [id number], DOB:  17-Jul-1961, LOS: 5 ADMISSION DATE:  09/24/2023, CONSULTATION DATE:  09/25/2023 REFERRING MD:  Eloise Harman- EDP, CHIEF COMPLAINT:  Respiratory Failure   History of Present Illness:  63 year old woman who presented to [facility] 2/14 as a transfer from [facility]) ED for respiratory failure. PMHx significant for bicuspid AV with severe AS (s/p bioprosthetic AVR 2022), COPD (on 2L HOT), tobacco use, RUL pulmonary nodule.   Patient initially presented to [facility] ED with SOB/CP x 2 days. On arrival to ED, she was afebrile, HR 79, BP 161/81, tachypneic to 29-30, SpO2 96%. Reportedly became hypoxic with EMS with associated lethargy and transient bradycardia to 30s. Endorsed SOB/CP, denied other URI symptoms. +Sick contact (grandson). Labs were notable for WBC 3.5, Hgb 15.1, Plt 84 (136 one month PTA). Na 143, K 4.1, CO2 41, Cr 0.40. LFTs grossly WNL. Trop 4 > 6. BNP 50. VBG 7.36/pCO2 94/bicarb 53.0. D-dimer 2.81. LA 1.0. +Flu A positive. CTA Chest 2/13 negative for PE, +mild airspace with tree-in-bud nodular opacities RML, bilateral scattered pulmonary nodules up to 9mm (new from prior), emphysema.   Patient was initially tolerating BiPAP, but clinically decompensated from a respiratory standpoint prompting intubation. Intubated in [facility] ED.   PCCM consulted for ICU admission/transfer.  Pertinent  Medical History  COPD, chronic respiratory failure on 2 L at home, aortic stenosis status post bioprosthetic valve replacement 05/2021, postoperative atrial fibrillation, tobacco abuse   Significant Hospital Events: Including procedures, antibiotic start and stop dates in addition to other pertinent events   2/13: Transferred from [facility] to [facility] for higher level of care. Flu A+. Intubated in ED. PCCM consulted for ICU admission.  2/14:  Extubated late afternoon, overnight had resp distress - placed on bipap, anxiety issues 2/15: Started on IV Bactrim  2/16: Re-intubated  d/t hypercarbia and unable to tolerate BiPAP  2/17: Extubated. Stopped Solumedrol  Interim History / Subjective:  Overnight, became Hypoglycemic, started on D5LR Awake, Alert and oriented to self, time, and place.  Denies any CP/SOB/ pain.  On 4 L maintaining sats.   Objective   Blood pressure 123/63, pulse 88, temperature 99.1 F (37.3 C), resp. rate (!) 24, height 5\' 9"  (1.753 m), weight 71.2 kg, last menstrual period 12/02/2013, SpO2 99%.        Intake/Output Summary (Last 24 hours) at 09/29/2023 0838 Last data filed at 09/29/2023 0030 Gross per 24 hour  Intake 483.61 ml  Output 1450 ml  Net -966.39 ml   Filed Weights   09/24/23 1753 09/25/23 0159 09/26/23 0500  Weight: 72.6 kg 71.3 kg 71.2 kg    Examination: General: Awake, Alert and oriented x 3. Answering questions appropriately.  HENT: [place]/AT.  Lungs: +Course crackles   Cardiovascular: RR. No murmur.  Abdomen: soft, ND, NT, bowel sounds present.  Extremities: warm. No edema.  Neuro: AO x 3.  GU: not assessed.   Resolved Hospital Problem list   Acute leukopenia  Assessment & Plan:  Acute hypoxemic and hypercarbic respiratory failure  COPD with Acute Exacerbation Flu A infection Pneumonia - stenotrophomonas Extubated 2/17, AO x 3, on 4 L oxygen via  maintaining sats. PT evaluated and recommended home health PT. Otherwise, stable and ready to be transferred out of ICU.    - IV Bactrim started 2/15 - Blood cultures x 2 no growth to date  - Avoid albuterol, results in tremors - PRN ativan for anxiety - Continue home dose sertraline 25 mg daily   -  Continue breathing treatments with budesonide, brovana and yupelri nebs - Continue Tamiflu    Bicuspid AV Severe AS s/p MVR 05/2021 (bioprosthetic) Previous MVR with Dr. Dorris Fetch 2022, on ASA 325 mg at home.  - ECHO LVEF 65-70% with G1DD (new), no regional wall motion abnormalities.  - Aspirin 325 mg  - resume home metoprolol when able   Tobacco  use Pulmonary nodules CTA Chest demonstrating multiple bilateral pulmonary nodules measuring up to 9mm, increased from prior. - Outpatient follow up for lung cancer screening/nodule characterization - Encourage smoking cessation   Chronic thrombocytopenia Monitor for any fever curves. On appropriate Abx coverage.  - Trend PLT    Nutrition Severe Protein Calorie malnutrition Stopped Tube feeds 2/17 Overnight hypoglycemia, started on D5LR - Stop D5LR - Continue Diet Dys type 3  - Encourage PO intake   Best Practice (right click and "Reselect all SmartList Selections" daily)   Diet/type: Dys Type 3  DVT prophylaxis prophylactic heparin  Pressure ulcer(s): Assessed by RN  GI prophylaxis: N/A Lines: PICC line  Foley:  N/A Code Status:  full code Last date of multidisciplinary goals of care discussion Jeanene Erb daughter Eliseo Gum today and updated her about patient's condition and plan to transfer out of ICU]  Labs   CBC: Recent Labs  Lab 09/25/23 0345 09/25/23 0424 09/26/23 0311 09/26/23 0435 09/27/23 0408 09/27/23 0833 09/27/23 1132 09/28/23 0342 09/29/23 0324  WBC 3.6*  --  13.5*  --  10.8*  --   --  9.8 8.5  NEUTROABS 3.0  --   --   --   --   --   --   --   --   HGB 14.3   < > 13.2   < > 13.2 12.9 12.9 12.4 12.0  HCT 44.8   < > 41.1   < > 41.6 38.0 38.0 38.1 37.7  MCV 98.2  --  98.8  --  99.8  --   --  97.4 99.7  PLT 79*  --  103*  --  113*  --   --  126* 112*   < > = values in this interval not displayed.    Basic Metabolic Panel: Recent Labs  Lab 09/24/23 2251 09/25/23 0114 09/25/23 0345 09/25/23 0424 09/26/23 1610 09/26/23 0435 09/27/23 0408 09/27/23 9604 09/27/23 1132 09/27/23 1845 09/28/23 0342 09/29/23 0324  NA  --    < > 143   < > 143   < > 143 141 139  --  140 141  K  --    < > 3.0*   < > 5.1   < > 5.2* 4.8 4.8  --  4.5 4.1  CL  --   --  96*  --  100  --  96*  --   --   --  94* 97*  CO2  --   --  30  --  35*  --  37*  --   --   --  36* 32   GLUCOSE  --   --  235*  --  153*  --  172*  --   --   --  127* 383*  BUN  --   --  19  --  34*  --  33*  --   --   --  40* 29*  CREATININE  --   --  0.81  --  0.58  --  0.67  --   --   --  0.61 0.59  CALCIUM  --   --  8.6*  --  8.9  --  8.8*  --   --   --  8.6* 8.4*  MG 2.0  --   --   --  2.2  --  2.4  --   --  2.3 2.3  --   PHOS 2.8  --   --   --   --   --  3.4  --   --  2.4* 2.9  --    < > = values in this interval not displayed.   GFR: Estimated Creatinine Clearance: 77.2 mL/min (by C-G formula based on SCr of 0.59 mg/dL). Recent Labs  Lab 09/24/23 2020 09/24/23 2251 09/25/23 0345 09/26/23 0311 09/27/23 0408 09/28/23 0342 09/29/23 0324  WBC  --   --    < > 13.5* 10.8* 9.8 8.5  LATICACIDVEN 1.0 1.2  --   --   --   --   --    < > = values in this interval not displayed.    Liver Function Tests: Recent Labs  Lab 09/24/23 1813  AST 19  ALT 14  ALKPHOS 45  BILITOT 0.9  PROT 6.4*  ALBUMIN 3.3*   No results for input(s): "LIPASE", "AMYLASE" in the last 168 hours. No results for input(s): "AMMONIA" in the last 168 hours.  ABG    Component Value Date/Time   PHART 7.471 (H) 09/27/2023 1132   PCO2ART 58.5 (H) 09/27/2023 1132   PO2ART 264 (H) 09/27/2023 1132   HCO3 42.9 (H) 09/27/2023 1132   TCO2 45 (H) 09/27/2023 1132   ACIDBASEDEF 3.0 (H) 05/22/2021 1752   O2SAT 100 09/27/2023 1132     Coagulation Profile: No results for input(s): "INR", "PROTIME" in the last 168 hours.  Cardiac Enzymes: No results for input(s): "CKTOTAL", "CKMB", "CKMBINDEX", "TROPONINI" in the last 168 hours.  HbA1C: Hgb A1c MFr Bld  Date/Time Value Ref Range Status  08/11/2023 12:35 PM 5.6 4.8 - 5.6 % Final    Comment:    (NOTE)         Prediabetes: 5.7 - 6.4         Diabetes: >6.4         Glycemic control for adults with diabetes: <7.0   05/20/2021 02:36 PM 5.6 4.8 - 5.6 % Final    Comment:    (NOTE) Pre diabetes:          5.7%-6.4%  Diabetes:              >6.4%  Glycemic  control for   <7.0% adults with diabetes     CBG: Recent Labs  Lab 09/28/23 1644 09/28/23 2001 09/28/23 2321 09/29/23 0339 09/29/23 0754  GLUCAP 91 84 109* 111* 110*    Review of Systems:   Denies any CP, sob and discomfort.   Past Medical History:  She,  has a past medical history of COPD (chronic obstructive pulmonary disease) (HCC), Headache, and Heart murmur.   Surgical History:   Past Surgical History:  Procedure Laterality Date   AORTIC VALVE REPLACEMENT N/A 05/22/2021   Procedure: AORTIC VALVE REPLACEMENT (AVR) WITH STERNAL PLATING. 23 MM INSPIRIS RESILIA  AORTIC VALVE.;  Surgeon: [name], MD;  Location: [facility];  Service: Open Heart Surgery;  Laterality: N/A;   CHOLECYSTECTOMY     lt knee surgery     RIGHT/LEFT HEART CATH AND CORONARY ANGIOGRAPHY N/A 04/30/2021   Procedure: RIGHT/LEFT HEART CATH AND CORONARY ANGIOGRAPHY;  Surgeon: Marykay Lex, MD;  Location: [facility] INVASIVE CV LAB;  Service: Cardiovascular;  Laterality: N/A;   TEE WITHOUT CARDIOVERSION N/A 05/22/2021   Procedure: TRANSESOPHAGEAL ECHOCARDIOGRAM (TEE);  Surgeon: [name], MD;  Location: [facility] OR;  Service: Open Heart Surgery;  Laterality: N/A;     Social History:   reports that she has been smoking cigarettes. She has a 44 pack-year smoking history. She has never used smokeless tobacco. She reports that she does not currently use alcohol. She reports that she does not use drugs.   Family History:  Her family history includes Heart disease in her mother; Heart failure in her mother.   Allergies No Known Allergies   Home Medications  Prior to Admission medications   Medication Sig Start Date End Date Taking? Authorizing Provider  Albuterol-Budesonide (AIRSUPRA) 90-80 MCG/ACT AERO Inhale 1 puff into the lungs every 4 (four) hours as needed (wheezing, shortness of breath).   Yes [provider]  furosemide (LASIX) 20 MG tablet Take 20 mg by mouth daily.   Yes [provider]  ipratropium-albuterol (DUONEB) 0.5-2.5 (3) MG/3ML SOLN Take 3 mLs by nebulization every 4 (four) hours as needed (Shortness of breath/wheezing). 06/17/22  Yes Glade [name], MD  metoprolol tartrate (LOPRESSOR) 25 MG tablet Take 1 tablet (25 mg total) by mouth 2 (two) times daily. 05/28/21 09/25/23 Yes Netta Neat., NP  sertraline (ZOLOFT) 50 MG tablet Take 50 mg by mouth daily.   Yes [provider]  Dwyane Luo 200-62.5-25 MCG/ACT AEPB Inhale 1 puff into the lungs daily. 04/13/23  Yes [provider]  aspirin EC 325 MG EC tablet Take 1 tablet (325 mg total) by mouth daily. 05/26/21   Rowe Clack, PA-C  Multiple Vitamin (MULTIVITAMIN WITH MINERALS) TABS tablet Take 1 tablet by mouth daily.    [provider]  naproxen sodium (ALEVE) 220 MG tablet Take 220 mg by mouth daily as needed (headache).    [provider]     Critical care time:

## 2023-09-30 ENCOUNTER — Inpatient Hospital Stay (HOSPITAL_COMMUNITY): Payer: BC Managed Care – PPO

## 2023-09-30 DIAGNOSIS — R4182 Altered mental status, unspecified: Secondary | ICD-10-CM

## 2023-09-30 DIAGNOSIS — J9601 Acute respiratory failure with hypoxia: Secondary | ICD-10-CM | POA: Diagnosis not present

## 2023-09-30 DIAGNOSIS — J09X1 Influenza due to identified novel influenza A virus with pneumonia: Secondary | ICD-10-CM | POA: Diagnosis not present

## 2023-09-30 DIAGNOSIS — J9602 Acute respiratory failure with hypercapnia: Secondary | ICD-10-CM | POA: Diagnosis not present

## 2023-09-30 DIAGNOSIS — J441 Chronic obstructive pulmonary disease with (acute) exacerbation: Secondary | ICD-10-CM | POA: Diagnosis not present

## 2023-09-30 DIAGNOSIS — I10 Essential (primary) hypertension: Secondary | ICD-10-CM | POA: Diagnosis not present

## 2023-09-30 DIAGNOSIS — L8990 Pressure ulcer of unspecified site, unspecified stage: Secondary | ICD-10-CM | POA: Diagnosis present

## 2023-09-30 LAB — CBC
HCT: 38.7 % (ref 36.0–46.0)
Hemoglobin: 12.5 g/dL (ref 12.0–15.0)
MCH: 31.9 pg (ref 26.0–34.0)
MCHC: 32.3 g/dL (ref 30.0–36.0)
MCV: 98.7 fL (ref 80.0–100.0)
Platelets: 113 10*3/uL — ABNORMAL LOW (ref 150–400)
RBC: 3.92 MIL/uL (ref 3.87–5.11)
RDW: 13.8 % (ref 11.5–15.5)
WBC: 12.3 10*3/uL — ABNORMAL HIGH (ref 4.0–10.5)
nRBC: 0 % (ref 0.0–0.2)

## 2023-09-30 LAB — BASIC METABOLIC PANEL
Anion gap: 10 (ref 5–15)
BUN: 19 mg/dL (ref 8–23)
CO2: 33 mmol/L — ABNORMAL HIGH (ref 22–32)
Calcium: 8.8 mg/dL — ABNORMAL LOW (ref 8.9–10.3)
Chloride: 96 mmol/L — ABNORMAL LOW (ref 98–111)
Creatinine, Ser: 0.63 mg/dL (ref 0.44–1.00)
GFR, Estimated: >60 mL/min (ref >60)
Glucose, Bld: 102 mg/dL — ABNORMAL HIGH (ref 70–99)
Potassium: 4.4 mmol/L (ref 3.5–5.1)
Sodium: 139 mmol/L (ref 135–145)

## 2023-09-30 LAB — GLUCOSE, CAPILLARY
Glucose-Capillary: 102 mg/dL — ABNORMAL HIGH (ref 70–99)
Glucose-Capillary: 142 mg/dL — ABNORMAL HIGH (ref 70–99)
Glucose-Capillary: 86 mg/dL (ref 70–99)
Glucose-Capillary: 88 mg/dL (ref 70–99)
Glucose-Capillary: 93 mg/dL (ref 70–99)
Glucose-Capillary: 97 mg/dL (ref 70–99)

## 2023-09-30 LAB — MAGNESIUM: Magnesium: 2.2 mg/dL (ref 1.7–2.4)

## 2023-09-30 LAB — PHOSPHORUS: Phosphorus: 3.2 mg/dL (ref 2.5–4.6)

## 2023-09-30 MED ORDER — LEVALBUTEROL HCL 1.25 MG/0.5ML IN NEBU
1.2500 mg | INHALATION_SOLUTION | Freq: Four times a day (QID) | RESPIRATORY_TRACT | Status: DC
  Administered 2023-09-30 – 2023-10-02 (×9): 1.25 mg via RESPIRATORY_TRACT
  Filled 2023-09-30 (×12): qty 0.5

## 2023-09-30 NOTE — Progress Notes (Signed)
Inpatient Rehab Admissions Coordinator:    CIR following. Pt. Is not yet ready and I have not yet been able to confirm a disposition plan.   Megan Salon, MS, CCC-SLP Rehab Admissions Coordinator  [phone] (celll) [phone] (office)

## 2023-09-30 NOTE — PMR Pre-admission (Shared)
PMR Admission Coordinator Pre-Admission Assessment  Patient: Crystal Silva is an 63 y.o., female MRN: [id number] DOB: 02-11-1961 Height: 5\' 9"  (175.3 cm) Weight: 66.3 kg  Insurance Information HMO:     PPO: ***     PCP: ***     IPA: ***     80/20: ***     OTHER: *** PRIMARY: BCBS Commercial PPO      Policy#: ***      Subscriber: *** CM Name: ***      Phone#: ***     Fax#: *** Pre-Cert#: ***      Employer: *** Benefits:  Phone #: ***     Name: *** Eff. Date: ***     Deduct: ***      Out of Pocket Max: ***      Life Max: *** CIR: ***      SNF: *** Outpatient: ***     Co-Pay: *** Home Health: ***      Co-Pay: *** DME: ***     Co-Pay: *** Providers: *** SECONDARY: ***      Policy#: ***     Phone#: ***  Financial Counselor:       Phone#:   The "Data Collection Information Summary" for patients in Inpatient Rehabilitation Facilities with attached "Privacy Act Statement-Health Care Records" was provided and verbally reviewed with: Patient  Emergency Contact Information Contact Information     Name Relation Home Work Mobile   Rose,Brandie Daughter [phone]  [phone]      Other Contacts     Name Relation Home Work Mobile   Authement,Amy Daughter   [phone]       Current Medical History  Patient Admitting Diagnosis: Respiratory Failure  History of Present Illness: Pt. Is a 63 year old with past medical history of significant for bicuspid AV with severe AS (s/p bioprosthetic AVR 2022), COPD (on 2L HOT), tobacco use, RUL pulmonary nodulewho presented [facility]  [facility]  as a transfer from  [facility]  ED for respiratory failure.Patient initially presented to [facility] ED with SOB/CP x 2 days. On arrival to ED , she was afebrile, HR 79, BP 161/81, tachypneic to 29-30, SpO2 96%. Reportedly became hypoxic with EMS with associated lethargy and transient bradycardia to 30s. Endorsed SOB/CP, denied other URI symptoms. +Sick contact (grandson). Labs were  notable for WBC 3.5, Hgb 15.1, Plt 84 (136 one month PTA). Na 143, K 4.1, CO2 41, Cr 0.40. LFTs grossly WNL. Trop 4 > 6. BNP 50. VBG 7.36/pCO2 94/bicarb 53.0. D-dimer 2.81. LA 1.0. +Flu A positive. CTA Chest 2/13 negative for PE, +mild airspace with tree-in-bud nodular opacities RML, bilateral scattered pulmonary nodules up to 9mm (new from prior), emphysema. Patient was initially tolerating BiPAP, but clinically decompensated from a respiratory standpoint prompting intubation. Intubated in [facility] ED.She was extubated later that day on 2/14 but required re-intubation 2/16-2/17. Following intubation, Pt. Was seen by PT/OT and they recommended CIR to assist return to PLOF.      Patient's medical record from [facility]  has been reviewed by the rehabilitation admission coordinator and physician.  Past Medical History  Past Medical History:  Diagnosis Date   COPD (chronic obstructive pulmonary disease) (HCC)    Headache    Heart murmur     Has the patient had major surgery during 100 days prior to admission? No  Family History   family history includes Heart disease in her mother; Heart failure in her mother.  Current Medications  Current Facility-Administered Medications:    arformoterol (BROVANA) nebulizer solution 15 mcg, 15 mcg, Nebulization, BID, Martina Sinner, MD, 15 mcg at 09/30/23 1610   aspirin tablet 325 mg, 325 mg, Oral, Daily, Lorin Glass, MD, 325 mg at 09/30/23 0844   budesonide (PULMICORT) nebulizer solution 0.5 mg, 0.5 mg, Nebulization, BID, Melody Comas B, MD, 0.5 mg at 09/30/23 9604   Chlorhexidine Gluconate Cloth 2 % PADS 6 each, 6 each, Topical, Q0600, Tim Lair, PA-C, 6 each at 09/29/23 1600   docusate (COLACE) 50 MG/5ML liquid 100 mg, 100 mg, Oral, BID PRN, Lorin Glass, MD   feeding supplement (ENSURE ENLIVE / ENSURE PLUS) liquid 237 mL, 237 mL, Oral, BID BM, Lorin Glass, MD, 237 mL at 09/30/23 1000   heparin injection 5,000  Units, 5,000 Units, Subcutaneous, Q8H, Reese, Stephanie M, PA-C, 5,000 Units at 09/30/23 0612   insulin aspart (novoLOG) injection 0-9 Units, 0-9 Units, Subcutaneous, Q4H, Reese, Stephanie M, PA-C, 1 Units at 09/28/23 0424   levalbuterol (XOPENEX) nebulizer solution 1.25 mg, 1.25 mg, Nebulization, Q6H, Rai, Ripudeep K, MD, 1.25 mg at 09/30/23 1247   LORazepam (ATIVAN) injection 0.5 mg, 0.5 mg, Intravenous, Q6H PRN, Martina Sinner, MD   multivitamin with minerals tablet 1 tablet, 1 tablet, Oral, Daily, Lorin Glass, MD, 1 tablet at 09/30/23 5409   Oral care mouth rinse, 15 mL, Mouth Rinse, PRN, Lorin Glass, MD   polyethylene glycol (MIRALAX / GLYCOLAX) packet 17 g, 17 g, Oral, Daily PRN, Lorin Glass, MD   revefenacin (YUPELRI) nebulizer solution 175 mcg, 175 mcg, Nebulization, Daily, Melody Comas B, MD, 175 mcg at 09/30/23 0902   senna-docusate (Senokot-S) tablet 2 tablet, 2 tablet, Oral, QHS, Tawkaliyar, Roya, DO   sertraline (ZOLOFT) tablet 25 mg, 25 mg, Oral, Daily, Lorin Glass, MD, 25 mg at 09/30/23 0844   sodium chloride flush (NS) 0.9 % injection 10-40 mL, 10-40 mL, Intracatheter, Q12H, Melody Comas B, MD, 10 mL at 09/30/23 1000   sodium chloride flush (NS) 0.9 % injection 10-40 mL, 10-40 mL, Intracatheter, PRN, Martina Sinner, MD   sulfamethoxazole-trimethoprim (BACTRIM DS) 800-160 MG per tablet 2 tablet, 2 tablet, Oral, Q12H, Lorin Glass, MD, 2 tablet at 09/30/23 8119  Patients Current Diet:  Diet Order             DIET DYS 3 Room service appropriate? Yes; Fluid consistency: Thin  Diet effective now                   Precautions / Restrictions Precautions Precautions: Fall Precaution/Restrictions Comments: bilateral wrist restraints Restrictions Weight Bearing Restrictions Per Provider Order: No   Has the patient had 2 or more falls or a fall with injury in the past year? No  Prior Activity Level  Pt. Was active in the community, caring  for her grandson PTA  Prior Functional Level Self Care: Did the patient need help bathing, dressing, using the toilet or eating? Independent  Indoor Mobility: Did the patient need assistance with walking from room to room (with or without device)? Independent  Stairs: Did the patient need assistance with internal or external stairs (with or without device)? Independent  Functional Cognition: Did the patient need help planning regular tasks such as shopping or remembering to take medications? Independent  Patient Information Are you of Hispanic, Latino/a,or Spanish origin?: A. No, not of Hispanic, Latino/a, or Spanish origin What is your race?: A. White Do you need or want an  interpreter to communicate with a doctor or health care staff?: 0. No  Patient's Response To:  Health Literacy and Transportation Is the patient able to respond to health literacy and transportation needs?: Yes Health Literacy - How often do you need to have someone help you when you read instructions, pamphlets, or other written material from your doctor or pharmacy?: Never In the past 12 months, has lack of transportation kept you from medical appointments or from getting medications?: No In the past 12 months, has lack of transportation kept you from meetings, work, or from getting things needed for daily living?: No  Home Assistive Devices / Equipment Home Equipment: BSC/3in1  Prior Device Use: Indicate devices/aids used by the patient prior to current illness, exacerbation or injury? None of the above  Current Functional Level Cognition  Orientation Level: (P) Oriented X4    Extremity Assessment (includes Sensation/Coordination)  Upper Extremity Assessment: Generalized weakness  Lower Extremity Assessment: Defer to PT evaluation    ADLs  Overall ADL's : Needs assistance/impaired Eating/Feeding: Set up, Sitting Grooming: Set up, Sitting Upper Body Bathing: Minimal assistance Lower Body Bathing:  Moderate assistance Upper Body Dressing : Minimal assistance Lower Body Dressing: Moderate assistance Toilet Transfer: Minimal assistance, Stand-pivot, BSC/3in1 Toileting- Clothing Manipulation and Hygiene: Moderate assistance Functional mobility during ADLs: Minimal assistance    Mobility  Overal bed mobility: Needs Assistance Bed Mobility: Supine to Sit, Sit to Supine Supine to sit: Contact guard Sit to supine: Min assist General bed mobility comments: in recliner    Transfers  Overall transfer level: Needs assistance Equipment used: None Transfers: Sit to/from Stand Sit to Stand: Min assist, +2 safety/equipment General transfer comment: Min assist for boost to stand +2 for equipment/safety. Verbal and tactile cues to facilitate, poor recall of set-up after instruction. Practiced from recliner x2.    Ambulation / Gait / Stairs / Wheelchair Mobility  Ambulation/Gait Ambulation/Gait assistance: Min assist, +2 safety/equipment Gait Distance (Feet): 15 Feet Assistive device: Rolling walker (2 wheels) Gait Pattern/deviations: Step-through pattern, Decreased stride length, Scissoring, Trunk flexed, Narrow base of support General Gait Details: On 4L supplemental O2, sats 90-92%, 2/3 dyspnea, HR 110s. Able to ambulate 15 feet with min assist to control RW and assist with balance, easily fatigued and anxious but without overt LE buckling. +2 follow with chair for safety. Required encouragement to continue distance which she completed to target. Cues required for sequencing and to avoid scissoring, to remain close to BOS of RW. Gait velocity: dec Gait velocity interpretation: <1.31 ft/sec, indicative of household ambulator    Posture / Balance Dynamic Sitting Balance Sitting balance - Comments: tripoding in sitting Balance Overall balance assessment: Needs assistance Sitting-balance support: Feet supported, No upper extremity supported Sitting balance-Leahy Scale: Fair Sitting balance -  Comments: tripoding in sitting Standing balance support: Bilateral upper extremity supported, Reliant on assistive device for balance Standing balance-Leahy Scale: Poor Standing balance comment: hands on RW    Special needs/care consideration Special Bed *** and Skin ***   Previous Home Environment (from acute therapy documentation) Living Arrangements: Other relatives (grandson, 22 y/o)  Lives With: Spouse Available Help at Discharge: Family, Available 24 hours/day Type of Home: House Home Layout: One level Home Access: Level entry Bathroom Shower/Tub: Health visitor: Standard Bathroom Accessibility: Yes How Accessible: Accessible via walker Home Care Services: No Additional Comments: pt is unable to report bathroom setup or DME availability  Discharge Living Setting Plans for Discharge Living Setting: House Type of Home at Discharge: [facility] Discharge  Home Layout: One level Discharge Home Access: Level entry Discharge Bathroom Shower/Tub: Walk-in shower Discharge Bathroom Toilet: Standard Discharge Bathroom Accessibility: Yes How Accessible: Accessible via walker Does the patient have any problems obtaining your medications?: No  Social/Family/Support Systems Patient Roles: Other (Comment) Contact Information: Boneta Lucks Anticipated Caregiver: [phone] Anticipated Caregiver's Contact Information: Daughter works, reaching out to other family to see what other assistance is available/ Caregiver Availability: 24/7 Discharge Plan Discussed with Primary Caregiver: Yes Is Caregiver In Agreement with Plan?: Yes Does Caregiver/Family have Issues with Lodging/Transportation while Pt is in Rehab?: No  Goals Patient/Family Goal for Rehab: PT/OT/SLP Supervisoin Expected length of stay: 12-14 days Pt/Family Agrees to Admission and willing to participate: Yes Program Orientation Provided & Reviewed with Pt/Caregiver Including Roles  & Responsibilities:  Yes  Decrease burden of Care through IP rehab admission: not anticipated  Possible need for SNF placement upon discharge: not anticipated  Patient Condition: {PATIENT'S CONDITION:22832}  Preadmission Screen Completed By:  Jeronimo Greaves, 09/30/2023 3:18 PM ______________________________________________________________________   Discussed status with Dr. Marland Kitchen on *** at *** and received approval for admission today.  Admission Coordinator:  Jeronimo Greaves, CCC-SLP, time Marland KitchenDorna Bloom ***   Assessment/Plan: Diagnosis: *** Does the need for close, 24 hr/day Medical supervision in concert with the patient's rehab needs make it unreasonable for this patient to be served in a less intensive setting? {yes_no_potentially:3041433} Co-Morbidities requiring supervision/potential complications: *** Due to {due GL:8756433}, does the patient require 24 hr/day rehab nursing? {yes_no_potentially:3041433} Does the patient require coordinated care of a physician, rehab nurse, PT, OT, and SLP to address physical and functional deficits in the context of the above medical diagnosis(es)? {yes_no_potentially:3041433} Addressing deficits in the following areas: {deficits:3041436} Can the patient actively participate in an intensive therapy program of at least 3 hrs of therapy 5 days a week? {yes_no_potentially:3041433} The potential for patient to make measurable gains while on inpatient rehab is {potential:3041437} Anticipated functional outcomes upon discharge from inpatient rehab: {functional outcomes:304600100} PT, {functional outcomes:304600100} OT, {functional outcomes:304600100} SLP Estimated rehab length of stay to reach the above functional goals is: *** Anticipated discharge destination: {anticipated dc setting:21604} 10. Overall Rehab/Functional Prognosis: {potential:3041437}   MD Signature: ***

## 2023-09-30 NOTE — Progress Notes (Signed)
[facility]                                                                              Crystal Silva, is a 63 y.o. female, DOB - 1960/12/21, WUJ:[id number] Admit date - 09/24/2023    Outpatient Primary MD for the patient is Benita Stabile, MD  LOS - 6  days  Chief Complaint  Patient presents with   Shortness of Breath   Chest Pain       Brief summary   Patient is a 63 year old female with bicuspid AV with severe AS (s/p bioprosthetic AVR 2022), COPD (on 2L HOT), tobacco use, RUL pulmonary nodule initially presented to [facility] ED with SOB/CP x 2 days. On arrival to ED, she was afebrile, HR 79, BP 161/81, tachypneic to 29-30, SpO2 96%. Reportedly became hypoxic with EMS with associated lethargy and transient bradycardia to 30s. Endorsed SOB/CP, denied other URI symptoms. +Sick contact (grandson). Labs were notable for WBC 3.5, Hgb 15.1, Plt 84 (136 one month PTA). Na 143, K 4.1, CO2 41, Cr 0.40. LFTs grossly WNL. Trop 4 > 6. BNP 50. VBG 7.36/pCO2 94/bicarb 53.0. D-dimer 2.81. LA 1.0. +Flu A positive. CTA Chest 2/13 negative for PE, +mild airspace with tree-in-bud nodular opacities RML, bilateral scattered pulmonary nodules up to 9mm (new from prior), emphysema.   Patient was initially tolerating BiPAP, but clinically decompensated from a respiratory standpoint prompting intubation. Intubated in [facility] ED.   Marland Kitchen  Patient was referred to [facility], admitted by CCM. Significant Hospital Events:   2/13: Transferred from [facility] to [facility] for higher level of care. Flu A+. Intubated in ED. PCCM consulted for ICU admission.  2/14:  Extubated late afternoon, overnight had resp distress - placed on bipap, anxiety issues 2/15: Started on IV Bactrim  2/16: Re-intubated d/t hypercarbia and unable to tolerate BiPAP  2/17: Extubated. Stopped Solumedrol 2/19: Changed to progressive care, TRH assumed care.  On 6 L O2 via [place], still short of breath.  Assessment & Plan    Principal Problem: Acute hypoxic and  hypercapnic respiratory failure (HCC) COPD with acute exacerbation -Initially intubated on 2/13 on admission, extubated on 2/14 however had to be reintubated on 2/16 due to hypercarbia and unable to tolerate BiPAP.  Extubated again on 2/17 -Currently dyspneic, wheezing, sitting upright, on 6 L O2 via [place] (at baseline on 3 L O2 via [place] outpatient)  -Continue Pulmicort, Brovana, placed on scheduled Xopenex, Yupelri -Continue incentive spirometry, on Bactrim -IV steroids were stopped on 2/17 due to concern for steroid-induced psychosis resulting in anxiety and agitation. -Continue O2, wean as tolerated -2D echo 2/14 showed EF of 65 to 70%, no regional WMA, G1 DD mild to moderate TR -CTA chest 2/13 negative for PE  Flu A infection -Completed Tamiflu x 5 days  Pneumonia - stenotrophomonas -Initially intubated on 2/13 on admission, extubated on 2/14 however had to be reintubated on 2/16 due to hypercarbia and unable to tolerate BiPAP.  Extubated again on 2/17 -IV Bactrim started on 2/15, now transitioned to p.o. Bactrim   Anxiety, ?  ICU delirium -IV steroids were discontinued on 2/17 due to concern for steroid-induced  psychosis resulting in anxiety and agitation -Continue as needed Ativan for anxiety, continue Zoloft     Bicuspid AV Severe AS s/p MVR 05/2021 (bioprosthetic) -Previous MVR with Dr. Dorris Fetch 2022, on ASA 325 mg at home.  -2D echo showed EF of 65-70% with G1DD (new), no regional wall motion abnormalities.  -Continue aspirin 325 mg  - resume home metoprolol when able   Tobacco use Pulmonary nodules -CTA Chest showed multiple bilateral pulmonary nodules measuring up to 9mm, increased from prior. - Outpatient follow up for lung cancer screening/nodule characterization - Encourage smoking cessation   Chronic thrombocytopenia -Stable, continue to monitor    Severe Protein Calorie malnutrition -Due to critical illness -Tube feeds stopped on 2/17 -SLP evaluation on  2/18, continue dysphagia 3 diet    Pressure Injury Documentation: Sacrum with stage I, POA Foam dressing per shift, wound care    Estimated body mass index is 21.58 kg/m as calculated from the following:   Height as of this encounter: 5\' 9"  (1.753 m).   Weight as of this encounter: 66.3 kg.  Code Status: Full CODE STATUS DVT Prophylaxis:  heparin injection 5,000 Units Start: 09/25/23 0600 SCDs Start: 09/24/23 2348   Level of Care: Level of care: Progressive Family Communication: Updated patient Disposition Plan:      Remains inpatient appropriate:      Procedures:  Intubation, extubation x 2  Consultants:   Admitted by CCM  Antimicrobials:   Anti-infectives (From admission, onward)    Start     Dose/Rate Route Frequency Ordered Stop   09/29/23 2200  sulfamethoxazole-trimethoprim (BACTRIM DS) 800-160 MG per tablet 2 tablet        2 tablet Oral Every 12 hours 09/29/23 0921 10/04/23 0959   09/29/23 2200  oseltamivir (TAMIFLU) capsule 75 mg  Status:  Discontinued        75 mg Oral 2 times daily 09/29/23 0924 09/29/23 1421   09/29/23 1515  oseltamivir (TAMIFLU) capsule 75 mg        75 mg Oral 2 times daily 09/29/23 1421 09/29/23 2157   09/29/23 1015  oseltamivir (TAMIFLU) 6 MG/ML suspension 75 mg  Status:  Discontinued        75 mg Oral 2 times daily 09/29/23 0921 09/29/23 0924   09/27/23 2315  oseltamivir (TAMIFLU) 6 MG/ML suspension 75 mg  Status:  Discontinued        75 mg Per Tube 2 times daily 09/27/23 2226 09/29/23 0921   09/27/23 1245  oseltamivir (TAMIFLU) capsule 75 mg  Status:  Discontinued        75 mg Per Tube 2 times daily 09/27/23 1145 09/27/23 2226   09/27/23 1130  sulfamethoxazole-trimethoprim (BACTRIM DS) 800-160 MG per tablet 2 tablet  Status:  Discontinued        2 tablet Per Tube Every 12 hours 09/27/23 1041 09/29/23 0921   09/26/23 1630  sulfamethoxazole-trimethoprim (BACTRIM) 320 mg of trimethoprim in dextrose 5 % 500 mL IVPB  Status:  Discontinued         320 mg of trimethoprim 346.7 mL/hr over 90 Minutes Intravenous Every 12 hours 09/26/23 1536 09/27/23 1041   09/25/23 2200  cefTRIAXone (ROCEPHIN) 2 g in sodium chloride 0.9 % 100 mL IVPB  Status:  Discontinued        2 g 200 mL/hr over 30 Minutes Intravenous Every 24 hours 09/25/23 1024 09/26/23 1536   09/25/23 0000  oseltamivir (TAMIFLU) 6 MG/ML suspension 75 mg  Status:  Discontinued  75 mg Per Tube 2 times daily 09/24/23 2337 09/27/23 1145   09/24/23 2200  cefTRIAXone (ROCEPHIN) 2 g in sodium chloride 0.9 % 100 mL IVPB        2 g 200 mL/hr over 30 Minutes Intravenous  Once 09/24/23 2159 09/24/23 2317   09/24/23 2200  azithromycin (ZITHROMAX) 500 mg in sodium chloride 0.9 % 250 mL IVPB  Status:  Discontinued        500 mg 250 mL/hr over 60 Minutes Intravenous Every 24 hours 09/24/23 2159 09/26/23 1536          Medications  arformoterol  15 mcg Nebulization BID   aspirin  325 mg Oral Daily   budesonide (PULMICORT) nebulizer solution  0.5 mg Nebulization BID   Chlorhexidine Gluconate Cloth  6 each Topical Q0600   feeding supplement  237 mL Oral BID BM   heparin  5,000 Units Subcutaneous Q8H   insulin aspart  0-9 Units Subcutaneous Q4H   multivitamin with minerals  1 tablet Oral Daily   revefenacin  175 mcg Nebulization Daily   senna-docusate  2 tablet Oral QHS   sertraline  25 mg Oral Daily   sodium chloride flush  10-40 mL Intracatheter Q12H   sulfamethoxazole-trimethoprim  2 tablet Oral Q12H      Subjective:   Crystal Silva was seen and examined today.  Sitting upright, still feels short of breath and tight, on 6 L O2 via [place].  Per patient on 3 L O2 at home.  No fevers or chills, coughing.  Still feels short of breath on minimal exertion.    Objective:   Vitals:   09/30/23 0600 09/30/23 0700 09/30/23 0800 09/30/23 0903  BP: 125/68 (!) 143/87    Pulse: 79 100  78  Resp: 19 (!) 22  18  Temp:   97.8 F (36.6 C)   TempSrc:   Oral   SpO2: 93% 91%  93%  Weight:       Height:        Intake/Output Summary (Last 24 hours) at 09/30/2023 0935 Last data filed at 09/30/2023 0650 Gross per 24 hour  Intake 120 ml  Output 1300 ml  Net -1180 ml     Wt Readings from Last 3 Encounters:  09/30/23 66.3 kg  08/25/23 73.5 kg  08/11/23 76.1 kg     Exam General: Alert and oriented x 3, sitting upright, able to converse full sentences, pursed lip breathing Cardiovascular: S1 S2 auscultated,  RRR Respiratory: Diminished throughout with scattered wheezing Gastrointestinal: Soft, nontender, nondistended, + bowel sounds Ext: no pedal edema bilaterally Neuro: no new deficits Psych: Normal affect     Data Reviewed:  I have personally reviewed following labs    CBC Lab Results  Component Value Date   WBC 12.3 (H) 09/30/2023   RBC 3.92 09/30/2023   HGB 12.5 09/30/2023   HCT 38.7 09/30/2023   MCV 98.7 09/30/2023   MCH 31.9 09/30/2023   PLT 113 (L) 09/30/2023   MCHC 32.3 09/30/2023   RDW 13.8 09/30/2023   LYMPHSABS 0.4 (L) 09/25/2023   MONOABS 0.1 09/25/2023   EOSABS 0.0 09/25/2023   BASOSABS 0.0 09/25/2023     Last metabolic panel Lab Results  Component Value Date   NA 139 09/30/2023   K 4.4 09/30/2023   CL 96 (L) 09/30/2023   CO2 33 (H) 09/30/2023   BUN 19 09/30/2023   CREATININE 0.63 09/30/2023   GLUCOSE 102 (H) 09/30/2023   GFRNONAA >60 09/30/2023   GFRAA  05/02/2009    >60        The eGFR has been calculated using the MDRD equation. This calculation has not been validated in all clinical situations. eGFR's persistently <60 mL/min signify possible Chronic Kidney Disease.   CALCIUM 8.8 (L) 09/30/2023   PHOS 3.2 09/30/2023   PROT 6.4 (L) 09/24/2023   ALBUMIN 3.3 (L) 09/24/2023   BILITOT 0.9 09/24/2023   ALKPHOS 45 09/24/2023   AST 19 09/24/2023   ALT 14 09/24/2023   ANIONGAP 10 09/30/2023    CBG (last 3)  Recent Labs    09/29/23 2334 09/30/23 0331 09/30/23 0805  GLUCAP 103* 93 86      Coagulation Profile: No  results for input(s): "INR", "PROTIME" in the last 168 hours.   Radiology Studies: I have personally reviewed the imaging studies  No results found.     Thad Ranger M.D. [facility] 09/30/2023, 9:35 AM  Available via Epic secure chat 7am-7pm After 7 pm, please refer to night coverage provider listed on amion.

## 2023-09-30 NOTE — Progress Notes (Signed)
Inpatient Rehab Admissions Coordinator:    I spoke with pt. 's daughter Eliseo Gum regarding potential CIR admit for Pt. She is interested but not sure family can provide 24/7 support at d/c. Pt.'s other daughter is not in the picture and Pt. Was actually caring for that daughter's 62 year old son, of whom she has custody. Eliseo Gum states she will talk to other family members and see what they can pull together. I will also get rehab MD to consult and weigh in on whether Pt. Could be left alone at dc.   Megan Salon, MS, CCC-SLP Rehab Admissions Coordinator  [phone] (celll) [phone] (office)

## 2023-10-01 DIAGNOSIS — J9622 Acute and chronic respiratory failure with hypercapnia: Secondary | ICD-10-CM | POA: Diagnosis not present

## 2023-10-01 DIAGNOSIS — R4182 Altered mental status, unspecified: Secondary | ICD-10-CM | POA: Diagnosis not present

## 2023-10-01 DIAGNOSIS — J9602 Acute respiratory failure with hypercapnia: Secondary | ICD-10-CM | POA: Diagnosis not present

## 2023-10-01 DIAGNOSIS — J9601 Acute respiratory failure with hypoxia: Secondary | ICD-10-CM

## 2023-10-01 DIAGNOSIS — J441 Chronic obstructive pulmonary disease with (acute) exacerbation: Secondary | ICD-10-CM | POA: Diagnosis not present

## 2023-10-01 LAB — BRAIN NATRIURETIC PEPTIDE: B Natriuretic Peptide: 20.7 pg/mL (ref 0.0–100.0)

## 2023-10-01 LAB — GLUCOSE, CAPILLARY: Glucose-Capillary: 95 mg/dL (ref 70–99)

## 2023-10-01 MED ORDER — SERTRALINE HCL 50 MG PO TABS
50.0000 mg | ORAL_TABLET | Freq: Every day | ORAL | Status: DC
  Administered 2023-10-01 – 2023-10-05 (×5): 50 mg via ORAL
  Filled 2023-10-01 (×5): qty 1

## 2023-10-01 NOTE — Consult Note (Signed)
Physical Medicine and Rehabilitation Consult Reason for Consult:possible CIR Referring Physician: Dr Isidoro Donning   HPI: Crystal Silva is a 63 y.o. R handedfemale with hx of COPD on 3L/ O2 at home; RUL pulmonary nodule that is getting larger, smoker; bicuspid AV and severe AS, admitted to [facility] with resp failure-intubated in ED , but extubated 2/14- then reintubated 2/16 and transferred to [facility]- she ended up being extubated 2/17-  Dx'd with Flu A and pneumonia from Sterotriphoaes - her steroids were stopped 2/17 due to agitation and steroid induced psychosis- pt denies these Sx's and says was just jittery.   Dx'd with Severe protein malnutrition- was on TF's, however still not eating much since on regular diet.  Is still on 5-6L O2 at all times.  WBC up to 12.3 from 8- not clear why.   Pt notes she didn't get vaccinated for flu.  O2 sats of 90% are normal for her, pt pt.  Doesn't know LBM- but doesn't feel constipated- maybe 2/18? But chart does show 2/19.  Said not eating since just "doesn't feel hungry".      ROS Feels slightly SOB; poor appetite No palpitations or Chest pain or edema Still coughing some, but improving No N/V/C/D Feels slightly weak- but thinks can get better at home.  Otherwise, an entire ROS was completed and negative except HPI and ROS Past Medical History:  Diagnosis Date   COPD (chronic obstructive pulmonary disease) (HCC)    Headache    Heart murmur    Past Surgical History:  Procedure Laterality Date   AORTIC VALVE REPLACEMENT N/A 05/22/2021   Procedure: AORTIC VALVE REPLACEMENT (AVR) WITH STERNAL PLATING. 23 MM INSPIRIS RESILIA  AORTIC VALVE.;  Surgeon: [name], MD;  Location: [facility] OR;  Service: Open Heart Surgery;  Laterality: N/A;   CHOLECYSTECTOMY     lt knee surgery     RIGHT/LEFT HEART CATH AND CORONARY ANGIOGRAPHY N/A 04/30/2021   Procedure: RIGHT/LEFT HEART CATH AND CORONARY ANGIOGRAPHY;  Surgeon: Marykay Lex, MD;   [facility] INVASIVE CV LAB;  Service: Cardiovascular;  Laterality: N/A;   TEE WITHOUT CARDIOVERSION N/A 05/22/2021   Procedure: TRANSESOPHAGEAL ECHOCARDIOGRAM (TEE);  Surgeon: [name], MD;  Location: [facility] OR;  Service: Open Heart Surgery;  Laterality: N/A;   Family History  Problem Relation Age of Onset   Heart failure Mother    Heart disease Mother        stent   Social History:  reports that she has been smoking cigarettes. She has a 44 pack-year smoking history. She has never used smokeless tobacco. She reports that she does not currently use alcohol. She reports that she does not use drugs. Allergies: No Known Allergies Medications Prior to Admission  Medication Sig Dispense Refill   Albuterol-Budesonide (AIRSUPRA) 90-80 MCG/ACT AERO Inhale 1 puff into the lungs every 4 (four) hours as needed (wheezing, shortness of breath).     aspirin EC 325 MG EC tablet Take 1 tablet (325 mg total) by mouth daily.     furosemide (LASIX) 20 MG tablet Take 20 mg by mouth daily.     ipratropium-albuterol (DUONEB) 0.5-2.5 (3) MG/3ML SOLN Take 3 mLs by nebulization every 4 (four) hours as needed (Shortness of breath/wheezing). 360 mL 0   metoprolol tartrate (LOPRESSOR) 25 MG tablet Take 1 tablet (25 mg total) by mouth 2 (two) times daily. 180 tablet 3   Multiple Vitamin (MULTIVITAMIN WITH MINERALS) TABS tablet Take 1 tablet by mouth  daily.     naproxen sodium (ALEVE) 220 MG tablet Take 220 mg by mouth daily as needed (headache).     sertraline (ZOLOFT) 50 MG tablet Take 50 mg by mouth daily.     TRELEGY ELLIPTA 200-62.5-25 MCG/ACT AEPB Inhale 1 puff into the lungs daily.      Home: Home Living Family/patient expects to be discharged to:: Private residence Living Arrangements: Other relatives (grandson, 15 y/o) Available Help at Discharge: Family, Available 24 hours/day Type of Home: House Home Access: Level entry Home Layout: One level Bathroom Shower/Tub: Therapist, art: Administrator Accessibility: Yes Home Equipment: BSC/3in1 Additional Comments: pt is unable to report bathroom setup or DME availability  Lives With: Spouse  Functional History: Prior Function Prior Level of Function : Independent/Modified Independent Mobility Comments: ind ADLs Comments: ind Functional Status:  Mobility: Bed Mobility Overal bed mobility: Needs Assistance Bed Mobility: Supine to Sit Supine to sit: Supervision, HOB elevated Sit to supine: Min assist General bed mobility comments: in recliner Transfers Overall transfer level: Needs assistance Equipment used: Rolling walker (2 wheels) Transfers: Sit to/from Stand, Bed to chair/wheelchair/BSC Sit to Stand: Min assist Bed to/from chair/wheelchair/BSC transfer type:: Step pivot Step pivot transfers: Min assist General transfer comment: CGA to stand from bed with RW, Min A to manuever RW to/from [facility] and Min A to stand from [facility] w/ cues for pushing from armrests Ambulation/Gait Ambulation/Gait assistance: Min assist, +2 safety/equipment Gait Distance (Feet): 15 Feet Assistive device: Rolling walker (2 wheels) Gait Pattern/deviations: Step-through pattern, Decreased stride length, Scissoring, Trunk flexed, Narrow base of support General Gait Details: On 4L supplemental O2, sats 90-92%, 2/3 dyspnea, HR 110s. Able to ambulate 15 feet with min assist to control RW and assist with balance, easily fatigued and anxious but without overt LE buckling. +2 follow with chair for safety. Required encouragement to continue distance which she completed to target. Cues required for sequencing and to avoid scissoring, to remain close to BOS of RW. Gait velocity: dec Gait velocity interpretation: <1.31 ft/sec, indicative of household ambulator    ADL: ADL Overall ADL's : Needs assistance/impaired Eating/Feeding: Set up, Sitting Grooming: Set up, Sitting, Wash/dry face Upper Body Bathing: Minimal assistance, Sitting Upper  Body Bathing Details (indicate cue type and reason): assist for back. pt able to bathe abdomen and underarms though fatigued with this Lower Body Bathing: Minimal assistance, Sitting/lateral leans, Sit to/from stand Lower Body Bathing Details (indicate cue type and reason): assist to bathe peri region in standing but pt able to reach to lower LE seated on BSC to bathe Upper Body Dressing : Minimal assistance, Sitting Lower Body Dressing: Moderate assistance Toilet Transfer: Minimal assistance, Stand-pivot, BSC/3in1, Rolling walker (2 wheels) Toilet Transfer Details (indicate cue type and reason): light assist to lift to stand and assist to manuever RW to/from [facility] Toileting- Clothing Manipulation and Hygiene: Minimal assistance, Sitting/lateral lean, Sit to/from stand Toileting - Clothing Manipulation Details (indicate cue type and reason): assist for peri care Functional mobility during ADLs: Minimal assistance  Cognition: Cognition Orientation Level: Oriented X4 Cognition Arousal: Alert Behavior During Therapy: Flat affect  Blood pressure 127/87, pulse 100, temperature 98.3 F (36.8 C), temperature source Oral, resp. rate (!) 21, height 5\' 9"  (1.753 m), weight 64.9 kg, last menstrual period 12/02/2013, SpO2 96%. Physical Exam Vitals and nursing note reviewed.  Constitutional:      Appearance: She is normal weight.     Comments: Pt sitting up slightly, but flexed forward at spine in bedside chair-  didn't touch breakfast or lunch, on 5L O2 by [place]; awake, alert, NAD; ill appearing  HENT:     Head: Normocephalic and atraumatic.     Right Ear: External ear normal.     Left Ear: External ear normal.     Nose: Nose normal. No congestion.     Mouth/Throat:     Mouth: Mucous membranes are dry.     Pharynx: Oropharynx is clear. No oropharyngeal exudate.  Eyes:     General:        Right eye: No discharge.        Left eye: No discharge.     Extraocular Movements: Extraocular movements  intact.  Cardiovascular:     Rate and Rhythm: Regular rhythm. Tachycardia present.     Heart sounds: Normal heart sounds. No murmur heard.    No gallop.  Pulmonary:     Comments: Decreased throughout A few rhonchi and wheezes heard Abdominal:     General: Abdomen is flat.     Palpations: Abdomen is soft.     Comments: Hypoactive , NT, ND  Musculoskeletal:     Cervical back: Neck supple.     Comments: Ue's 5-/5 throughout B/L LE's- 4+ to 5-/5 B/L throughout   Skin:    General: Skin is warm and dry.     Comments: A lot of ecchymoses on arms and legs, worst on LLE   Neurological:     Mental Status: She is alert.     Comments: Slowed higher level processing  Psychiatric:     Comments: Very flat     Results for orders placed or performed during the hospital encounter of 09/24/23 (from the past 24 hours)  Glucose, capillary     Status: Abnormal   Collection Time: 09/30/23  8:16 PM  Result Value Ref Range   Glucose-Capillary 102 (H) 70 - 99 mg/dL  Glucose, capillary     Status: None   Collection Time: 09/30/23 11:45 PM  Result Value Ref Range   Glucose-Capillary 97 70 - 99 mg/dL  Glucose, capillary     Status: None   Collection Time: 10/01/23  4:14 AM  Result Value Ref Range   Glucose-Capillary 95 70 - 99 mg/dL  Brain natriuretic peptide     Status: None   Collection Time: 10/01/23  8:00 AM  Result Value Ref Range   B Natriuretic Peptide 20.7 0.0 - 100.0 pg/mL   DG CHEST PORT 1 VIEW Result Date: 09/30/2023 CLINICAL DATA:  Dyspnea. EXAM: PORTABLE CHEST 1 VIEW COMPARISON:  September 27, 2023. FINDINGS: The heart size and mediastinal contours are within normal limits. Sternotomy wires are noted. Status post aortic valve repair. Right-sided PICC line is noted with distal tip in expected position of cavoatrial junction. Minimal bibasilar subsegmental atelectasis is noted. The visualized skeletal structures are unremarkable. IMPRESSION: Right-sided PICC line as noted above. Minimal  bibasilar subsegmental atelectasis. Electronically Signed   By: Lupita Raider M.D.   On: 09/30/2023 16:32     Assessment/Plan: Diagnosis: Debility due to COPD exacerbation from flu A= pt EMPHATIC doesn't want to come to acute rehab for any reason ("I see no reason not to do it at home"). We discussed her concerns, and her lack of appetite- didn't eat anything except a few bites from breakfast and lunch trays in room- but doesn't seem concerned about her lack of appetite  Of note, pt got very SOB reaching to answer her phone- If she decides to come in a few days,  that's fine, please let us know as long as pt has a dispo set up- because she will need assistance at home due to poor endurance Comment:    Genice Rouge, MD 10/01/2023

## 2023-10-01 NOTE — Progress Notes (Signed)
10/01/2023   I have seen and evaluated the patient for ongoing hypoxemia.   S:  Some breathing issues thi am relieved with PRN albuterol   O: Blood pressure 96/66, pulse 64, temperature 97.6 F (36.4 C), temperature source Oral, resp. rate 17, height 5\' 3"  (1.6 m), weight 81.6 kg, SpO2 94 %.    No distress Anxious Crackles bases otherwise occasional wheezing Moves to command Very deconditioned  Labs okay stable thrombocytopenia   A:  Acute on chronic hypoxemic and hypercarbic respiratory failure secondary to flu A and stenotrophomonas induced AECOPD improving  Physical deconditioning  COPD on HOT   P:  I think just needs time, PT, mobility Complete course of bactrim Triple neb therapy: okay to transition to trelegy on DC Ongoing discussions regarding dispositions Sertraline and PRN ativan seem to help as well Needs 3 month f/u CT chest for pulmonary nodules: seems she follows with [facility] for this Will ask RN to reach out if any further issues   Myrla Halsted MD [facility] Prefer epic messenger for cross cover needs If after hours, please call E-link

## 2023-10-01 NOTE — Progress Notes (Signed)
[facility]                                                                              Crystal Silva, is a 64 y.o. female, DOB - 10-Nov-1960, ZOX:[id number] Admit date - 09/24/2023    Outpatient Primary MD for the patient is Benita Stabile, MD  LOS - 7  days  Chief Complaint  Patient presents with   Shortness of Breath   Chest Pain       Brief summary   Patient is a 63 year old female with bicuspid AV with severe AS (s/p bioprosthetic AVR 2022), COPD (on 2L HOT), tobacco use, RUL pulmonary nodule initially presented to [facility] ED with SOB/CP x 2 days. On arrival to ED, she was afebrile, HR 79, BP 161/81, tachypneic to 29-30, SpO2 96%. Reportedly became hypoxic with EMS with associated lethargy and transient bradycardia to 30s. Endorsed SOB/CP, denied other URI symptoms. +Sick contact (grandson). Labs were notable for WBC 3.5, Hgb 15.1, Plt 84 (136 one month PTA). Na 143, K 4.1, CO2 41, Cr 0.40. LFTs grossly WNL. Trop 4 > 6. BNP 50. VBG 7.36/pCO2 94/bicarb 53.0. D-dimer 2.81. LA 1.0. +Flu A positive. CTA Chest 2/13 negative for PE, +mild airspace with tree-in-bud nodular opacities RML, bilateral scattered pulmonary nodules up to 9mm (new from prior), emphysema.   Patient was initially tolerating BiPAP, but clinically decompensated from a respiratory standpoint prompting intubation. Intubated in [facility] ED.   Marland Kitchen  Patient was referred to [facility], admitted by CCM. Significant Hospital Events:   2/13: Transferred from [facility] to [facility] for higher level of care. Flu A+. Intubated in ED. PCCM consulted for ICU admission.  2/14:  Extubated late afternoon, overnight had resp distress - placed on bipap, anxiety issues 2/15: Started on IV Bactrim  2/16: Re-intubated d/t hypercarbia and unable to tolerate BiPAP  2/17: Extubated. Stopped Solumedrol 2/19: Changed to progressive care, [facility] assumed care.  On 6 L O2 via [place], still short of breath.  Assessment & Plan    Principal Problem: Acute hypoxic and  hypercapnic respiratory failure (HCC) COPD with acute exacerbation -Initially intubated on 2/13 on admission, extubated on 2/14 however had to be reintubated on 2/16 due to hypercarbia and unable to tolerate BiPAP.  Extubated again on 2/17 -at baseline on 3 L O2 via [place] outpatient  -Sitting upright, still has wheezing, continue Pulmicort, Brovana, Xopenex, Yupelri  -Continue incentive spirometry, on Bactrim -IV steroids were stopped on 2/17 due to concern for steroid-induced psychosis resulting in anxiety and agitation. -Continue O2, wean as tolerated -2D echo 2/14 showed EF of 65 to 70%, no regional WMA, G1 DD mild to moderate TR -CTA chest 2/13 negative for PE -Anxious, increased Zoloft to 50 mg daily, continue IV Ativan as needed for anxiety -BNP 20.7  Flu A infection -Completed Tamiflu x 5 days  Pneumonia - stenotrophomonas -Initially intubated on 2/13 on admission, extubated on 2/14 however had to be reintubated on 2/16 due to hypercarbia and unable to tolerate BiPAP.  Extubated again on 2/17 -IV Bactrim started on 2/15, now transitioned to p.o. Bactrim   Anxiety, ?  ICU delirium -IV steroids were  discontinued on 2/17 due to concern for steroid-induced psychosis resulting in anxiety and agitation -Continue as needed Ativan for anxiety, continue Zoloft, increased to 50 mg daily     Bicuspid AV Severe AS s/p MVR 05/2021 (bioprosthetic) -Previous MVR with Dr. Dorris Fetch 2022, on ASA 325 mg at home.  -2D echo showed EF of 65-70% with G1DD (new), no regional wall motion abnormalities.  -Continue aspirin 325 mg  - resume home metoprolol when able   Tobacco use Pulmonary nodules -CTA Chest showed multiple bilateral pulmonary nodules measuring up to 9mm, increased from prior. - Outpatient follow up for lung cancer screening/nodule characterization - Encourage smoking cessation   Chronic thrombocytopenia -Stable, continue to monitor    Severe Protein Calorie malnutrition -Due  to critical illness -Tube feeds stopped on 2/17 -SLP evaluation on 2/18, continue dysphagia 3 diet    Pressure Injury Documentation: Sacrum with stage I, POA Foam dressing per shift, wound care    Estimated body mass index is 21.13 kg/m as calculated from the following:   Height as of this encounter: 5\' 9"  (1.753 m).   Weight as of this encounter: 64.9 kg.  Code Status: Full CODE STATUS DVT Prophylaxis:  heparin injection 5,000 Units Start: 09/25/23 0600 SCDs Start: 09/24/23 2348   Level of Care: Level of care: Progressive Family Communication: Updated patient Disposition Plan:      Remains inpatient appropriate:      Procedures:  Intubation, extubation x 2  Consultants:   Admitted by CCM  Antimicrobials:   Anti-infectives (From admission, onward)    Start     Dose/Rate Route Frequency Ordered Stop   09/29/23 2200  sulfamethoxazole-trimethoprim (BACTRIM DS) 800-160 MG per tablet 2 tablet        2 tablet Oral Every 12 hours 09/29/23 0921 10/04/23 0959   09/29/23 2200  oseltamivir (TAMIFLU) capsule 75 mg  Status:  Discontinued        75 mg Oral 2 times daily 09/29/23 0924 09/29/23 1421   09/29/23 1515  oseltamivir (TAMIFLU) capsule 75 mg        75 mg Oral 2 times daily 09/29/23 1421 09/29/23 2157   09/29/23 1015  oseltamivir (TAMIFLU) 6 MG/ML suspension 75 mg  Status:  Discontinued        75 mg Oral 2 times daily 09/29/23 0921 09/29/23 0924   09/27/23 2315  oseltamivir (TAMIFLU) 6 MG/ML suspension 75 mg  Status:  Discontinued        75 mg Per Tube 2 times daily 09/27/23 2226 09/29/23 0921   09/27/23 1245  oseltamivir (TAMIFLU) capsule 75 mg  Status:  Discontinued        75 mg Per Tube 2 times daily 09/27/23 1145 09/27/23 2226   09/27/23 1130  sulfamethoxazole-trimethoprim (BACTRIM DS) 800-160 MG per tablet 2 tablet  Status:  Discontinued        2 tablet Per Tube Every 12 hours 09/27/23 1041 09/29/23 0921   09/26/23 1630  sulfamethoxazole-trimethoprim (BACTRIM) 320 mg  of trimethoprim in dextrose 5 % 500 mL IVPB  Status:  Discontinued        320 mg of trimethoprim 346.7 mL/hr over 90 Minutes Intravenous Every 12 hours 09/26/23 1536 09/27/23 1041   09/25/23 2200  cefTRIAXone (ROCEPHIN) 2 g in sodium chloride 0.9 % 100 mL IVPB  Status:  Discontinued        2 g 200 mL/hr over 30 Minutes Intravenous Every 24 hours 09/25/23 1024 09/26/23 1536   09/25/23 0000  oseltamivir (TAMIFLU) 6  MG/ML suspension 75 mg  Status:  Discontinued        75 mg Per Tube 2 times daily 09/24/23 2337 09/27/23 1145   09/24/23 2200  cefTRIAXone (ROCEPHIN) 2 g in sodium chloride 0.9 % 100 mL IVPB        2 g 200 mL/hr over 30 Minutes Intravenous  Once 09/24/23 2159 09/24/23 2317   09/24/23 2200  azithromycin (ZITHROMAX) 500 mg in sodium chloride 0.9 % 250 mL IVPB  Status:  Discontinued        500 mg 250 mL/hr over 60 Minutes Intravenous Every 24 hours 09/24/23 2159 09/26/23 1536          Medications  arformoterol  15 mcg Nebulization BID   aspirin  325 mg Oral Daily   budesonide (PULMICORT) nebulizer solution  0.5 mg Nebulization BID   Chlorhexidine Gluconate Cloth  6 each Topical Q0600   feeding supplement  237 mL Oral BID BM   heparin  5,000 Units Subcutaneous Q8H   levalbuterol  1.25 mg Nebulization Q6H   multivitamin with minerals  1 tablet Oral Daily   revefenacin  175 mcg Nebulization Daily   senna-docusate  2 tablet Oral QHS   sertraline  50 mg Oral Daily   sodium chloride flush  10-40 mL Intracatheter Q12H   sulfamethoxazole-trimethoprim  2 tablet Oral Q12H      Subjective:   Crystal Silva was seen and examined today.  Sitting upright still somewhat short of breath, tight and wheezing, on 5 L O2 via [place].  No nausea vomiting, fevers or chills, chest pain.  Objective:   Vitals:   10/01/23 0948 10/01/23 1000 10/01/23 1100 10/01/23 1142  BP:  127/78 (!) 108/53   Pulse:  98 95   Resp:  18 (!) 28   Temp:    98.3 F (36.8 C)  TempSrc:    Oral  SpO2: 94% (!) 87%  (!) 88%   Weight:      Height:        Intake/Output Summary (Last 24 hours) at 10/01/2023 1220 Last data filed at 10/01/2023 0900 Gross per 24 hour  Intake 120 ml  Output 800 ml  Net -680 ml     Wt Readings from Last 3 Encounters:  10/01/23 64.9 kg  08/25/23 73.5 kg  08/11/23 76.1 kg   Physical Exam General: Alert and oriented x 3, NAD Cardiovascular: S1 S2 clear, RRR.  Respiratory: Diminished throughout with wheezing Gastrointestinal: Soft, nontender, nondistended, NBS Ext: no pedal edema bilaterally Neuro: no new deficits Psych: Normal affect     Data Reviewed:  I have personally reviewed following labs    CBC Lab Results  Component Value Date   WBC 12.3 (H) 09/30/2023   RBC 3.92 09/30/2023   HGB 12.5 09/30/2023   HCT 38.7 09/30/2023   MCV 98.7 09/30/2023   MCH 31.9 09/30/2023   PLT 113 (L) 09/30/2023   MCHC 32.3 09/30/2023   RDW 13.8 09/30/2023   LYMPHSABS 0.4 (L) 09/25/2023   MONOABS 0.1 09/25/2023   EOSABS 0.0 09/25/2023   BASOSABS 0.0 09/25/2023     Last metabolic panel Lab Results  Component Value Date   NA 139 09/30/2023   K 4.4 09/30/2023   CL 96 (L) 09/30/2023   CO2 33 (H) 09/30/2023   BUN 19 09/30/2023   CREATININE 0.63 09/30/2023   GLUCOSE 102 (H) 09/30/2023   GFRNONAA >60 09/30/2023   GFRAA  05/02/2009    >60  The eGFR has been calculated using the MDRD equation. This calculation has not been validated in all clinical situations. eGFR's persistently <60 mL/min signify possible Chronic Kidney Disease.   CALCIUM 8.8 (L) 09/30/2023   PHOS 3.2 09/30/2023   PROT 6.4 (L) 09/24/2023   ALBUMIN 3.3 (L) 09/24/2023   BILITOT 0.9 09/24/2023   ALKPHOS 45 09/24/2023   AST 19 09/24/2023   ALT 14 09/24/2023   ANIONGAP 10 09/30/2023    CBG (last 3)  Recent Labs    09/30/23 2016 09/30/23 2345 10/01/23 0414  GLUCAP 102* 97 95      Coagulation Profile: No results for input(s): "INR", "PROTIME" in the last 168  hours.   Radiology Studies: I have personally reviewed the imaging studies  DG CHEST PORT 1 VIEW Result Date: 09/30/2023 CLINICAL DATA:  Dyspnea. EXAM: PORTABLE CHEST 1 VIEW COMPARISON:  September 27, 2023. FINDINGS: The heart size and mediastinal contours are within normal limits. Sternotomy wires are noted. Status post aortic valve repair. Right-sided PICC line is noted with distal tip in expected position of cavoatrial junction. Minimal bibasilar subsegmental atelectasis is noted. The visualized skeletal structures are unremarkable. IMPRESSION: Right-sided PICC line as noted above. Minimal bibasilar subsegmental atelectasis. Electronically Signed   By: Lupita Raider M.D.   On: 09/30/2023 16:32       Jonni Oelkers M.D. [facility] 10/01/2023, 12:20 PM  Available via Epic secure chat 7am-7pm After 7 pm, please refer to night coverage provider listed on amion.

## 2023-10-01 NOTE — Progress Notes (Signed)
   10/01/23 1515  Mobility  Activity Transferred from bed to chair  Level of Assistance Moderate assist, patient does 50-74%  Assistive Device Other (Comment) (HHA)  Activity Response Tolerated fair  Mobility Referral Yes  Mobility visit 1 Mobility  Mobility Specialist Start Time (ACUTE ONLY) 1458  Mobility Specialist Stop Time (ACUTE ONLY) 1515  Mobility Specialist Time Calculation (min) (ACUTE ONLY) 17 min   Mobility Specialist: Progress Note  Post-Mobility:    HR 105, SpO2 96% 5LO2  Pt agreeable to mobility session - received in transfer chair to bed then to [facility]. C/o BR urgency and weakness. Pt appeared fatigued. Returned to [facility] with all needs met - call bell within reach. RN and NT aware.   Barnie Mort, BS Mobility Specialist Please contact via SecureChat or  Rehab office at [phone].

## 2023-10-01 NOTE — Progress Notes (Signed)
Occupational Therapy Treatment Patient Details Name: Crystal Silva MRN: [id number] DOB: 02-13-1961 Today's Date: 10/01/2023   History of present illness 63 y.o. female presents to [facility] hospital on 2/14 as a transfer from [facility] due to respiratory failure. Pt intubated in [facility] ED, extubated on 2/14 but requiring re-intubation on 2/16. Extubated 2/17. PMH includes bicuspid AV with severe AS, COPD, RUL pulmonary nodule.   OT comments  Pt making steady progress towards OT goals w/ session focusing on [facility] transfers using RW at Min A, as well as full bathing task with Min A. Pt benefits from cues for breathing and rest breaks due to DOE, as well as continued education for energy conservation. Minor confusion noted during session but appears to be approving. Feel pt would greatly improve to a Modified Independent level with intensive rehab services.   Spo2 88% and above on 5 L O2      If plan is discharge home, recommend the following:  A little help with walking and/or transfers;A lot of help with bathing/dressing/bathroom;Assistance with cooking/housework;Direct supervision/assist for medications management;Direct supervision/assist for financial management;Assist for transportation;Help with stairs or ramp for entrance;Supervision due to cognitive status   Equipment Recommendations  BSC/3in1    Recommendations for Other Services Rehab consult    Precautions / Restrictions Precautions Precautions: Fall Recall of Precautions/Restrictions: Impaired Precaution/Restrictions Comments: monitor O2 Restrictions Weight Bearing Restrictions Per Provider Order: No       Mobility Bed Mobility Overal bed mobility: Needs Assistance Bed Mobility: Supine to Sit     Supine to sit: Supervision, HOB elevated          Transfers Overall transfer level: Needs assistance Equipment used: Rolling walker (2 wheels) Transfers: Sit to/from Stand, Bed to chair/wheelchair/BSC Sit to Stand: Min  assist     Step pivot transfers: Min assist     General transfer comment: CGA to stand from bed with RW, Min A to manuever RW to/from [facility] and Min A to stand from [facility] w/ cues for pushing from armrests     Balance Overall balance assessment: Needs assistance Sitting-balance support: Feet supported, No upper extremity supported Sitting balance-Leahy Scale: Fair     Standing balance support: Bilateral upper extremity supported, Reliant on assistive device for balance Standing balance-Leahy Scale: Poor                             ADL either performed or assessed with clinical judgement   ADL Overall ADL's : Needs assistance/impaired     Grooming: Set up;Sitting;Wash/dry face   Upper Body Bathing: Minimal assistance;Sitting Upper Body Bathing Details (indicate cue type and reason): assist for back. pt able to bathe abdomen and underarms though fatigued with this Lower Body Bathing: Minimal assistance;Sitting/lateral leans;Sit to/from stand Lower Body Bathing Details (indicate cue type and reason): assist to bathe peri region in standing but pt able to reach to lower LE seated on BSC to bathe Upper Body Dressing : Minimal assistance;Sitting       Toilet Transfer: Minimal assistance;Stand-pivot;BSC/3in1;Rolling walker (2 wheels) Toilet Transfer Details (indicate cue type and reason): light assist to lift to stand and assist to manuever RW to/from [facility] Toileting- Clothing Manipulation and Hygiene: Minimal assistance;Sitting/lateral lean;Sit to/from stand Toileting - Clothing Manipulation Details (indicate cue type and reason): assist for peri care            Extremity/Trunk Assessment Upper Extremity Assessment Upper Extremity Assessment: Generalized weakness;Right hand dominant   Lower  Extremity Assessment Lower Extremity Assessment: Defer to PT evaluation        Vision   Vision Assessment?: No apparent visual deficits   Perception     Praxis      Communication Communication Communication: No apparent difficulties   Cognition Arousal: Alert Behavior During Therapy: Flat affect Cognition: Cognition impaired     Awareness: Online awareness impaired, Intellectual awareness intact Memory impairment (select all impairments): Declarative long-term memory, Short-term memory, Working memory Attention impairment (select first level of impairment): Selective attention Executive functioning impairment (select all impairments): Problem solving, Initiation OT - Cognition Comments: pleasant, flat affect and anxious with SOB but able to follow directions consistently. minor confusion noted for higher level tasks but [facility] for basic tasks                 Following commands: Impaired Following commands impaired: Follows one step commands with increased time, Follows multi-step commands inconsistently      Cueing   Cueing Techniques: Verbal cues, Tactile cues  Exercises      Shoulder Instructions       General Comments SpO2 > 88% on 5 L O2. Artifact HR to 206 but replaced electrode and once at rest, normalized    Pertinent Vitals/ Pain       Pain Assessment Pain Assessment: Faces Faces Pain Scale: Hurts a little bit Pain Location: BLE Pain Descriptors / Indicators: Sore Pain Intervention(s): Monitored during session  Home Living                                          Prior Functioning/Environment              Frequency  Min 1X/week        Progress Toward Goals  OT Goals(current goals can now be found in the care plan section)  Progress towards OT goals: Progressing toward goals  Acute Rehab OT Goals Patient Stated Goal: improve SOB, not use purewick OT Goal Formulation: With patient Time For Goal Achievement: 10/13/23 Potential to Achieve Goals: Good ADL Goals Pt Will Perform Upper Body Dressing: with modified independence;sitting Pt Will Perform Lower Body Dressing: with modified  independence;sit to/from stand;sitting/lateral leans Pt Will Transfer to Toilet: with modified independence;ambulating;regular height toilet Additional ADL Goal #1: pt will follow 2 step command in prep for ADLs  Plan      Co-evaluation                 AM-PAC OT "6 Clicks" Daily Activity     Outcome Measure   Help from another person eating meals?: A Little Help from another person taking care of personal grooming?: A Little Help from another person toileting, which includes using toliet, bedpan, or urinal?: A Lot Help from another person bathing (including washing, rinsing, drying)?: A Little Help from another person to put on and taking off regular upper body clothing?: A Little Help from another person to put on and taking off regular lower body clothing?: A Lot 6 Click Score: 16    End of Session Equipment Utilized During Treatment: Rolling walker (2 wheels)  OT Visit Diagnosis: Unsteadiness on feet (R26.81);Other abnormalities of gait and mobility (R26.89);Muscle weakness (generalized) (M62.81);Other symptoms and signs involving cognitive function   Activity Tolerance Patient tolerated treatment well   Patient Left in chair;with call bell/phone within reach;with chair alarm set   Nurse Communication Mobility status  Time: 7829-5621 OT Time Calculation (min): 30 min  Charges: OT General Charges $OT Visit: 1 Visit OT Treatments $Self Care/Home Management : 23-37 mins  Bradd Canary, OTR/L Acute Rehab Services Office: [phone]   Lorre Munroe 10/01/2023, 10:00 AM

## 2023-10-01 NOTE — Progress Notes (Signed)
Nutrition Follow-up  DOCUMENTATION CODES:  Not applicable  INTERVENTION:  Continue current diet as ordered MVI with minerals daily Ensure Enlive po BID, each supplement provides 350 kcal and 20 grams of protein.  NUTRITION DIAGNOSIS:  Increased nutrient needs related to acute illness as evidenced by estimated needs. - remains applicable  GOAL:  Patient will meet greater than or equal to 90% of their needs - progressing, diet advanced  MONITOR:  PO intake, Supplement acceptance, Labs, Weight trends  REASON FOR ASSESSMENT:  Consult Enteral/tube feeding initiation and management  ASSESSMENT:  Pt with hx of COPD and tobacco abuse (1 PPD) presented to [facility] ED with SOB and chest pain. Found to have Flu A. Intubated in ED and admitted to [facility] ICU.  2/13 - presented to ED, intubated 2/14 - transferred to [facility] ICU, extubated 2/16 - reintubated, PICC placed  2/17 - extubated 2/18 - SLP bedside evaluation, DYS 3, thin  Pt resting in bedside chair at the time of assessment. States that she has been eating some, but that her appetite is definitely not back to normal. Still feeling weak from her acute illness. Breakfast tray noted at bedside, mostly consumed. Ensure Enlive also at bedside unopened. Pt has never had them before. Discussed that this would be a good way to ensure adequate nutrition to aid in recovery until appetite is back to normal. Pt agrees to start drinking them. Also provided with an [place] ice from floor stock per her request.   Admit weight: 72.6 kg  Current weight: 64.9 kg  3% weight loss noted in the last month to the admit weight, not severe   Intake/Output Summary (Last 24 hours) at 10/01/2023 1350 Last data filed at 10/01/2023 0900 Gross per 24 hour  Intake 120 ml  Output 800 ml  Net -680 ml  Net IO Since Admission: -1,087.16 mL [10/01/23 1350]  Drains/Lines: PICC triple lumen UOP x 24 hours  Nutritionally Relevant  Medications: Scheduled Meds:  Ensure Enlive  237 mL Oral BID BM   multivitamin with minerals  1 tablet Oral Daily   senna-docusate  2 tablet Oral QHS   sulfamethoxazole-trimethoprim  2 tablet Oral Q12H   PRN Meds:.docusate, polyethylene glycol  Labs Reviewed: Chloride 94 CBG ranges from 116-179 mg/dL over the last 24 hours HgbA1c 5.6%  NUTRITION - FOCUSED PHYSICAL EXAM: Flowsheet Row Most Recent Value  Orbital Region Mild depletion  Upper Arm Region No depletion  Thoracic and Lumbar Region No depletion  Buccal Region No depletion  Temple Region Mild depletion  Clavicle Bone Region Mild depletion  Clavicle and Acromion Bone Region Mild depletion  Scapular Bone Region Mild depletion  Dorsal Hand Unable to assess  [mittens]  Patellar Region Severe depletion  Anterior Thigh Region Severe depletion  Posterior Calf Region Moderate depletion  Edema (RD Assessment) None  Hair Reviewed  Eyes Reviewed  Mouth Reviewed  Skin Reviewed  Nails Reviewed   Diet Order:   Diet Order             DIET DYS 3 Room service appropriate? Yes; Fluid consistency: Thin  Diet effective now                   EDUCATION NEEDS:  Not appropriate for education at this time  Skin:  Skin Assessment: Reviewed RN Assessment Stage 1: - Sacrum (2 x 2 cm)  Last BM:  2/18 - type 6  Height:  Ht Readings from Last 1 Encounters:  09/24/23 5\' 9"  (  1.753 m)   Weight:  Wt Readings from Last 1 Encounters:  10/01/23 64.9 kg   Ideal Body Weight:  65.9 kg  BMI:  Body mass index is 21.13 kg/m.  Estimated Nutritional Needs:  Kcal:  1600-1800 kcal/d Protein:  80-100g/d Fluid:  1.8-2L/d    Greig Castilla, RD, LDN Registered Dietitian II Please reach out via secure chat Weekend on-call pager # available in [place]

## 2023-10-01 NOTE — Plan of Care (Signed)
   Problem: Clinical Measurements: Goal: Respiratory complications will improve Outcome: Progressing   Problem: Activity: Goal: Risk for activity intolerance will decrease Outcome: Progressing   Problem: Coping: Goal: Level of anxiety will decrease Outcome: Progressing

## 2023-10-02 DIAGNOSIS — J9622 Acute and chronic respiratory failure with hypercapnia: Secondary | ICD-10-CM | POA: Diagnosis not present

## 2023-10-02 LAB — CBC
HCT: 38.8 % (ref 36.0–46.0)
Hemoglobin: 12.6 g/dL (ref 12.0–15.0)
MCH: 31.8 pg (ref 26.0–34.0)
MCHC: 32.5 g/dL (ref 30.0–36.0)
MCV: 98 fL (ref 80.0–100.0)
Platelets: 155 10*3/uL (ref 150–400)
RBC: 3.96 MIL/uL (ref 3.87–5.11)
RDW: 13.9 % (ref 11.5–15.5)
WBC: 9.5 10*3/uL (ref 4.0–10.5)
nRBC: 0 % (ref 0.0–0.2)

## 2023-10-02 LAB — BASIC METABOLIC PANEL
Anion gap: 11 (ref 5–15)
BUN: 12 mg/dL (ref 8–23)
CO2: 28 mmol/L (ref 22–32)
Calcium: 9 mg/dL (ref 8.9–10.3)
Chloride: 98 mmol/L (ref 98–111)
Creatinine, Ser: 0.59 mg/dL (ref 0.44–1.00)
GFR, Estimated: >60 mL/min (ref >60)
Glucose, Bld: 99 mg/dL (ref 70–99)
Potassium: 4.5 mmol/L (ref 3.5–5.1)
Sodium: 137 mmol/L (ref 135–145)

## 2023-10-02 MED ORDER — METHYLPREDNISOLONE SODIUM SUCC 125 MG IJ SOLR
80.0000 mg | Freq: Every day | INTRAMUSCULAR | Status: DC
  Administered 2023-10-02 – 2023-10-04 (×3): 80 mg via INTRAVENOUS
  Filled 2023-10-02 (×3): qty 2

## 2023-10-02 MED ORDER — BOOST / RESOURCE BREEZE PO LIQD CUSTOM
1.0000 | Freq: Three times a day (TID) | ORAL | Status: DC
  Administered 2023-10-02 – 2023-10-05 (×10): 1 via ORAL

## 2023-10-02 MED ORDER — PANTOPRAZOLE SODIUM 40 MG PO TBEC
40.0000 mg | DELAYED_RELEASE_TABLET | Freq: Every day | ORAL | Status: DC
  Administered 2023-10-02 – 2023-10-05 (×4): 40 mg via ORAL
  Filled 2023-10-02 (×4): qty 1

## 2023-10-02 NOTE — Progress Notes (Signed)
Speech Language Pathology Treatment: Dysphagia  Patient Details Name: Crystal Silva MRN: [id number] DOB: 1961/02/01 Today's Date: 10/02/2023 Time: 9528-4132 SLP Time Calculation (min) (ACUTE ONLY): 8 min  Assessment / Plan / Recommendation Clinical Impression  Pt states she feels significantly improved since previous SLP visit. Her dentures were placed and she fed herself trials of thin liquids and regular solids without signs clinically concerning for aspiration. She achieves prompt and complete oral clearance and independently uses a liquid wash as needed. Recommend upgrading diet to regular solids and continuing thin liquids without ongoing SLP f/u. Will s/o at this time.    HPI HPI: Crystal Silva who presented to [facility] 2/14 as a [facility] ED for respiratory failure. Flu A positive.  CTA Chest 2/13 negative for PE, +mild airspace with tree-in-bud nodular opacities RML, bilateral scattered pulmonary nodules up to 9mm (new from prior), emphysema. Patient was initially tolerating BiPAP, but clinically decompensated from a respiratory standpoint prompting intubation. Intubated in [facility] ED 2/13, extubated 2/14; reintubated 2/16-17. PMHx significant for bicuspid AV with severe AS (s/p bioprosthetic AVR 2022), COPD (on 2L HOT), tobacco use, RUL pulmonary nodule.      SLP Plan  All goals met      Recommendations for follow up therapy are one component of a multi-disciplinary discharge planning process, led by the attending physician.  Recommendations may be updated based on patient status, additional functional criteria and insurance authorization.    Recommendations  Diet recommendations: Regular;Thin liquid Liquids provided via: Straw;Cup Medication Administration: Whole meds with liquid Supervision: Patient able to self feed Compensations: Slow rate;Small sips/bites Postural Changes and/or Swallow Maneuvers: Seated upright 90 degrees                   Oral care BID   PRN Dysphagia, unspecified (R13.10)     All goals met     Gwynneth Aliment, M.A., CF-SLP Speech Language Pathology, Acute Rehabilitation Services  Secure Chat preferred [phone]   10/02/2023, 12:52 PM

## 2023-10-02 NOTE — TOC Progression Note (Signed)
Transition of Care [facility]) - Progression Note    Patient Details  Name: Crystal Silva MRN: [id number] Date of Birth: 07-Jun-1961  [facility] of [facility]) CM/SW Contact  Mearl Latin, LCSW Phone Number: 10/02/2023, 10:05 AM  Clinical Narrative:    Patient transferred to 5W. Appears patient may lack support for CIR so TOC will conduct SNF workup.   Expected Discharge Plan: Skilled Nursing Facility Barriers to Discharge: Continued Medical Work up, English as a second language teacher, SNF Pending bed offer  Expected Discharge Plan and Services In-house Referral: Clinical Social Work   Post Acute Care Choice: Skilled Nursing Facility Living arrangements for the past 2 months: Single Family Home                                       Social Determinants of Health (SDOH) Interventions SDOH Screenings   Food Insecurity: Patient Unable To Answer (09/25/2023)  Housing: Patient Unable To Answer (09/25/2023)  Transportation Needs: Patient Unable To Answer (09/25/2023)  Utilities: Patient Unable To Answer (09/25/2023)  Social Connections: Socially Isolated (08/14/2023)  Tobacco Use: High Risk (09/24/2023)    Readmission Risk Interventions     No data to display

## 2023-10-02 NOTE — Progress Notes (Signed)
IP rehab admissions - Please see consult done by Dr. Berline Chough yesterday.  At this point, patient disposition is not confirmed.  Her daughters work.  Likely patient will need more supervision upon discharge due to decreased endurance.  Agree with pursuit of SNF placement as an option.  [phone]

## 2023-10-02 NOTE — TOC Progression Note (Signed)
Transition of Care [facility]) - Progression Note    Patient Details  Name: Crystal Silva MRN: [id number] Date of Birth: 1961-01-27  Transition of Care [facility]) CM/SW Contact  Jessie Foot, RN Phone Number: 10/02/2023, 4:00 PM  Clinical Narrative:    Spoke with patient. Patient agreeable to SNF, if CIR is unable to accept patient. CM RN will send out referrals for review.   Expected Discharge Plan: Skilled Nursing Facility Barriers to Discharge: Continued Medical Work up, English as a second language teacher, SNF Pending bed offer  Expected Discharge Plan and Services In-house Referral: Clinical Social Work Discharge Planning Services: [employer] Consult Post Acute Care Choice: Skilled Nursing Facility Living arrangements for the past 2 months: Single Family Home                 DME Arranged: N/A DME Agency: [employer]                   Social Determinants of Health (SDOH) Interventions SDOH Screenings   Food Insecurity: Patient Unable To Answer (09/25/2023)  Housing: Patient Unable To Answer (09/25/2023)  Transportation Needs: Patient Unable To Answer (09/25/2023)  Utilities: Patient Unable To Answer (09/25/2023)  Social Connections: Socially Isolated (08/14/2023)  Tobacco Use: High Risk (09/24/2023)    Readmission Risk Interventions     No data to display

## 2023-10-02 NOTE — Plan of Care (Signed)
  Problem: Health Behavior/Discharge Planning: Goal: Ability to manage health-related needs will improve Outcome: Progressing   Problem: Clinical Measurements: Goal: Respiratory complications will improve Outcome: Progressing Goal: Cardiovascular complication will be avoided Outcome: Progressing   Problem: Activity: Goal: Risk for activity intolerance will decrease Outcome: Progressing   Problem: Nutrition: Goal: Adequate nutrition will be maintained Outcome: Progressing   

## 2023-10-02 NOTE — [place] FL2 (Signed)
[place] MEDICAID FL2 LEVEL OF CARE FORM     IDENTIFICATION  Patient Name: Crystal Silva Birthdate: 30-Nov-1960 Sex: female Admission Date (Current Location): 09/24/2023  [facility] and [place] Number:  [employer] and Address:  [facility] [place], [address] [place], [place] [address]      Provider Number: [id number]  Attending Physician Name and Address:  Elgergawy, Leana Roe, MD  Relative Name and Phone Number:       Current Level of Care: Hospital Recommended Level of Care: Skilled Nursing Facility Prior Approval Number:    Date Approved/Denied:   PASRR Number: [id number]  Discharge Plan: SNF    Current Diagnoses: Patient Active Problem List   Diagnosis Date Noted   Pressure injury of skin 09/30/2023   Hypercapnic respiratory failure (HCC) 09/24/2023   Acute on chronic respiratory failure with hypoxia (HCC) 08/11/2023   Acute hypoxemic respiratory failure (HCC) 06/15/2022   Essential hypertension 06/13/2022   Depression 06/13/2022   DNR (do not resuscitate) 06/13/2022   Shortness of breath 05/30/2021   Hydropneumothorax 05/30/2021   Elevated troponin 05/30/2021   Elevated brain natriuretic peptide (BNP) level 05/30/2021   S/P aortic valve replacement 05/22/2021   DOE (dyspnea on exertion)    Aortic stenosis    Chest pain 04/29/2021   Tobacco use disorder 04/29/2021   Elevated d-dimer 04/29/2021   COPD (chronic obstructive pulmonary disease) (HCC) 04/29/2021   Aortic valve disorder 04/29/2021   COPD exacerbation (HCC) 04/29/2021    Orientation RESPIRATION BLADDER Height & Weight     Self, Time, Situation, Place  O2 ([place] 3 liters) External catheter, Continent Weight: 64.9 kg Height:  5\' 9"  (175.3 cm)  BEHAVIORAL SYMPTOMS/MOOD NEUROLOGICAL BOWEL NUTRITION STATUS      Continent Diet (See DC summary)  AMBULATORY STATUS COMMUNICATION OF NEEDS Skin   Limited Assist Verbally PU Stage and Appropriate Care (Stage 1 on sacrum)                        Personal Care Assistance Level of Assistance  Bathing, Feeding, Dressing Bathing Assistance: Limited assistance Feeding assistance: Independent Dressing Assistance: Limited assistance     Functional Limitations Info             SPECIAL CARE FACTORS FREQUENCY  PT (By licensed PT), OT (By licensed OT)     PT Frequency: 5x weekly OT Frequency: 5x weekly            Contractures Contractures Info: Not present    Additional Factors Info  Code Status, Allergies, Psychotropic, Isolation Precautions Code Status Info: FULL Allergies Info: NKA Psychotropic Info: Zolot   Isolation Precautions Info: Flu + 09/25/2023     Current Medications (10/02/2023):  This is the current hospital active medication list Current Facility-Administered Medications  Medication Dose Route Frequency Provider Last Rate Last Admin   arformoterol (BROVANA) nebulizer solution 15 mcg  15 mcg Nebulization BID Martina Sinner, MD   15 mcg at 10/02/23 9629   aspirin tablet 325 mg  325 mg Oral Daily Lorin Glass, MD   325 mg at 10/02/23 1153   budesonide (PULMICORT) nebulizer solution 0.5 mg  0.5 mg Nebulization BID Melody Comas B, MD   0.5 mg at 10/02/23 0758   Chlorhexidine Gluconate Cloth 2 % PADS 6 each  6 each Topical Q0600 Tim Lair, PA-C   6 each at 10/02/23 0555   docusate (COLACE) 50 MG/5ML liquid 100 mg  100  mg Oral BID PRN Lorin Glass, MD       feeding supplement (BOOST / RESOURCE BREEZE) liquid 1 Container  1 Container Oral TID BM Elgergawy, Leana Roe, MD   1 Container at 10/02/23 1354   heparin injection 5,000 Units  5,000 Units Subcutaneous Q8H Cloyd Stagers M, [place]   5,000 Units at 10/02/23 1353   LORazepam (ATIVAN) injection 0.5 mg  0.5 mg Intravenous Q6H PRN Martina Sinner, MD       methylPREDNISolone sodium succinate (SOLU-MEDROL) 125 mg/2 mL injection 80 mg  80 mg Intravenous Daily Elgergawy, Leana Roe, MD   80 mg at 10/02/23 1353    multivitamin with minerals tablet 1 tablet  1 tablet Oral Daily Lorin Glass, MD   1 tablet at 10/02/23 1154   Oral care mouth rinse  15 mL Mouth Rinse PRN Lorin Glass, MD       pantoprazole (PROTONIX) EC tablet 40 mg  40 mg Oral Daily Elgergawy, Leana Roe, MD   40 mg at 10/02/23 1354   polyethylene glycol (MIRALAX / GLYCOLAX) packet 17 g  17 g Oral Daily PRN Lorin Glass, MD       revefenacin (YUPELRI) nebulizer solution 175 mcg  175 mcg Nebulization Daily Martina Sinner, MD   175 mcg at 10/02/23 1610   senna-docusate (Senokot-S) tablet 2 tablet  2 tablet Oral QHS Tawkaliyar, Roya, DO   2 tablet at 09/30/23 2218   sertraline (ZOLOFT) tablet 50 mg  50 mg Oral Daily Rai, Ripudeep K, MD   50 mg at 10/02/23 1153   sodium chloride flush (NS) 0.9 % injection 10-40 mL  10-40 mL Intracatheter Q12H Melody Comas B, MD   20 mL at 10/02/23 1154   sodium chloride flush (NS) 0.9 % injection 10-40 mL  10-40 mL Intracatheter PRN Martina Sinner, MD       sulfamethoxazole-trimethoprim (BACTRIM DS) 800-160 MG per tablet 2 tablet  2 tablet Oral Q12H Lorin Glass, MD   2 tablet at 10/02/23 1153     Discharge Medications: Please see discharge summary for a list of discharge medications.  Relevant Imaging Results:  Relevant Lab Results:   Additional Information SS# [id number]  Jessie Foot, RN

## 2023-10-02 NOTE — TOC Progression Note (Signed)
Transition of Care [facility]) - Progression Note    Patient Details  Name: Crystal Silva MRN: [id number] Date of Birth: 1960/11/24  Transition of Care [facility]) CM/SW Contact  Gordy Clement, RN Phone Number: 10/02/2023, 11:41 AM  Clinical Narrative:     RNCM met with patient bedside per patient request. Patient interested in update on her disability application with the state.  RNCM advised patient to call SS offices directly for an update.   RNCM discussed home environment and support system. Patient states she has adopted her 44 year old Grandson that lives with her. Lucila Maine is staying with patient's cousin, Jillyn Hidden, while patient is recovering/ rehab. Patient states she will have at least 1-2 additional family members for support after discharge to assist.   RNCM will follow for CIR/ SNF doisposition and provide any additional assistance for DC planning if disposition is home      Expected Discharge Plan: Skilled Nursing Facility Barriers to Discharge: Continued Medical Work up, English as a second language teacher, SNF Pending bed offer  Expected Discharge Plan and Services In-house Referral: Clinical Social Work   Post Acute Care Choice: Skilled Nursing Facility Living arrangements for the past 2 months: Single Family Home                                       Social Determinants of Health (SDOH) Interventions SDOH Screenings   Food Insecurity: Patient Unable To Answer (09/25/2023)  Housing: Patient Unable To Answer (09/25/2023)  Transportation Needs: Patient Unable To Answer (09/25/2023)  Utilities: Patient Unable To Answer (09/25/2023)  Social Connections: Socially Isolated (08/14/2023)  Tobacco Use: High Risk (09/24/2023)    Readmission Risk Interventions     No data to display

## 2023-10-02 NOTE — Progress Notes (Signed)
Physical Therapy Treatment Patient Details Name: Crystal Silva MRN: [id number] DOB: [birth year] Today's Date: 10/02/2023   History of Present Illness 63 y.o. female presents to [facility] hospital on 2/14 as a transfer from [facility] due to respiratory failure. Pt intubated in [facility] ED, extubated on 2/14 but requiring re-intubation on 2/16. Extubated 2/17. PMH includes bicuspid AV with severe AS, COPD, RUL pulmonary nodule.    PT Comments  Pt tolerated treatment well today. Pt was able to ambulate short distance in room with RW CGA/Min A +2 for safety. VSS on 3L. No change in DC/DME recs at this time. PT will continue to follow.      If plan is discharge home, recommend the following: A little help with walking and/or transfers;A little help with bathing/dressing/bathroom;Assistance with cooking/housework;Direct supervision/assist for medications management;Direct supervision/assist for financial management;Assist for transportation;Help with stairs or ramp for entrance;Supervision due to cognitive status   Can travel by private vehicle        Equipment Recommendations  Other (comment) (Per accepting facility)    Recommendations for Other Services       Precautions / Restrictions Precautions Precautions: Fall Recall of Precautions/Restrictions: Impaired Precaution/Restrictions Comments: monitor O2 Restrictions Weight Bearing Restrictions Per Provider Order: No     Mobility  Bed Mobility Overal bed mobility: Needs Assistance Bed Mobility: Supine to Sit     Supine to sit: Supervision, HOB elevated          Transfers Overall transfer level: Needs assistance Equipment used: Rolling walker (2 wheels) Transfers: Sit to/from Stand Sit to Stand: Min assist           General transfer comment: Cues for hand placement. Min A to power up.    Ambulation/Gait Ambulation/Gait assistance: Contact guard assist, Min assist, +2 safety/equipment Gait Distance (Feet): 15  Feet Assistive device: Rolling walker (2 wheels) Gait Pattern/deviations: Step-through pattern, Decreased stride length, Trunk flexed, Narrow base of support Gait velocity: dec     General Gait Details: Able to ambulate 15 feet with min assist to control RW and assist with balance, easily fatigued and anxious but without overt LE buckling. +2 follow with chair for safety. Required encouragement to continue distance which she completed to target. Cues required for sequencing and to remain close to BOS of RW.   Stairs             Wheelchair Mobility     Tilt Bed    Modified Rankin (Stroke Patients Only)       Balance Overall balance assessment: Needs assistance Sitting-balance support: Feet supported, No upper extremity supported Sitting balance-Leahy Scale: Fair     Standing balance support: Bilateral upper extremity supported, Reliant on assistive device for balance Standing balance-Leahy Scale: Poor Standing balance comment: hands on RW                            Communication Communication Communication: No apparent difficulties  Cognition Arousal: Alert Behavior During Therapy: Flat affect                           PT - Cognition Comments: Cues to initiate. Some slow processing noted. Following commands: Impaired Following commands impaired: Follows one step commands with increased time, Follows multi-step commands inconsistently    Cueing Cueing Techniques: Verbal cues, Tactile cues  Exercises      General Comments General comments (skin integrity, edema, etc.): VSS on 3L  Pertinent Vitals/Pain Pain Assessment Pain Assessment: No/denies pain    Home Living                          Prior Function            PT Goals (current goals can now be found in the care plan section) Progress towards PT goals: Progressing toward goals    Frequency    Min 1X/week      PT Plan      Co-evaluation               AM-PAC PT "6 Clicks" Mobility   Outcome Measure  Help needed turning from your back to your side while in a flat bed without using bedrails?: A Little Help needed moving from lying on your back to sitting on the side of a flat bed without using bedrails?: A Little Help needed moving to and from a bed to a chair (including a wheelchair)?: A Lot Help needed standing up from a chair using your arms (e.g., wheelchair or bedside chair)?: A Little Help needed to walk in hospital room?: A Lot Help needed climbing 3-5 steps with a railing? : Total 6 Click Score: 14    End of Session Equipment Utilized During Treatment: Gait belt;Oxygen Activity Tolerance: Patient tolerated treatment well Patient left: in chair;with call bell/phone within reach;with chair alarm set Nurse Communication: Mobility status PT Visit Diagnosis: Other abnormalities of gait and mobility (R26.89);Muscle weakness (generalized) (M62.81)     Time: 9562-1308 PT Time Calculation (min) (ACUTE ONLY): 13 min  Charges:    $Gait Training: 8-22 mins PT General Charges $$ ACUTE PT VISIT: 1 Visit                     Shela Nevin, PT, DPT Acute Rehab Services [phone]    Gladys Damme 10/02/2023, 4:02 PM

## 2023-10-02 NOTE — Progress Notes (Signed)
[facility]                                                                              Crystal Silva, is a 63 y.o. female, DOB - 11-01-1960, ZOX:[id number] Admit date - 09/24/2023    Outpatient Primary MD for the patient is Benita Stabile, MD  LOS - 8  days  Chief Complaint  Patient presents with   Shortness of Breath   Chest Pain       Brief summary   Patient is a 63 year old female with bicuspid AV with severe AS (s/p bioprosthetic AVR 2022), COPD (on 2L HOT), tobacco use, RUL pulmonary nodule initially presented to [facility] ED with SOB/CP x 2 days. On arrival to ED, she was afebrile, HR 79, BP 161/81, tachypneic to 29-30, SpO2 96%. Reportedly became hypoxic with EMS with associated lethargy and transient bradycardia to 30s. Endorsed SOB/CP, denied other URI symptoms. +Sick contact (grandson). Labs were notable for WBC 3.5, Hgb 15.1, Plt 84 (136 one month PTA). Na 143, K 4.1, CO2 41, Cr 0.40. LFTs grossly WNL. Trop 4 > 6. BNP 50. VBG 7.36/pCO2 94/bicarb 53.0. D-dimer 2.81. LA 1.0. +Flu A positive. CTA Chest 2/13 negative for PE, +mild airspace with tree-in-bud nodular opacities RML, bilateral scattered pulmonary nodules up to 9mm (new from prior), emphysema.   Patient was initially tolerating BiPAP, but clinically decompensated from a respiratory standpoint prompting intubation. Intubated in [facility] ED.   Marland Kitchen  Patient was referred to [facility], admitted by CCM. Significant Hospital Events:   2/13: Transferred from [facility] to [facility] for higher level of care. Flu A+. Intubated in ED. PCCM consulted for ICU admission.  2/14:  Extubated late afternoon, overnight had resp distress - placed on bipap, anxiety issues 2/15: Started on IV Bactrim  2/16: Re-intubated d/t hypercarbia and unable to tolerate BiPAP  2/17: Extubated. Stopped Solumedrol 2/19: Changed to progressive care, TRH assumed care.  On 6 L O2 via [place], still short of breath.  Assessment & Plan    Principal Problem: Acute hypoxic and  hypercapnic respiratory failure (HCC) COPD with acute exacerbation -Initially intubated on 2/13 on admission, extubated on 2/14 however had to be reintubated on 2/16 due to hypercarbia and unable to tolerate BiPAP.  Extubated again on 2/17 -at baseline on 3 L O2 via [place] outpatient  -Sitting upright, still has wheezing, continue Pulmicort, Brovana, Xopenex, Yupelri  -Continue incentive spirometry, on Bactrim -IV steroids were stopped on 2/17 due to concern for steroid-induced psychosis resulting in anxiety and agitation. -Continue O2, wean as tolerated -2D echo 2/14 showed EF of 65 to 70%, no regional WMA, G1 DD mild to moderate TR -CTA chest 2/13 negative for PE -Anxious, increased Zoloft to 50 mg daily, continue IV Ativan as needed for anxiety -BNP 20.7 -She remained with significant wheezing today, I will start again on steroids -She was encouraged to use incentive spirometry and flutter valve  Flu A infection -Completed Tamiflu x 5 days  Pneumonia - stenotrophomonas -Initially intubated on 2/13 on admission, extubated on 2/14 however had to be reintubated on 2/16 due to hypercarbia and unable to tolerate BiPAP.  Extubated again  on 2/17 -IV Bactrim started on 2/15, now transitioned to p.o. Bactrim   Anxiety, ?  ICU delirium -IV steroids were discontinued on 2/17 due to concern for steroid-induced psychosis resulting in anxiety and agitation -Continue as needed Ativan for anxiety, continue Zoloft, increased to 50 mg daily     Bicuspid AV Severe AS s/p MVR 05/2021 (bioprosthetic) -Previous MVR with Dr. Dorris Fetch 2022, on ASA 325 mg at home.  -2D echo showed EF of 65-70% with G1DD (new), no regional wall motion abnormalities.  -Continue aspirin 325 mg  - resume home metoprolol when able   Tobacco use Pulmonary nodules -CTA Chest showed multiple bilateral pulmonary nodules measuring up to 9mm, increased from prior. - Outpatient follow up for lung cancer screening/nodule  characterization - Encourage smoking cessation   Chronic thrombocytopenia -Stable, continue to monitor    Severe Protein Calorie malnutrition -Due to critical illness -Tube feeds stopped on 2/17 -SLP evaluation on 2/18, continue dysphagia 3 diet    Pressure Injury Documentation: Sacrum with stage I, POA Foam dressing per shift, wound care    Estimated body mass index is 21.13 kg/m as calculated from the following:   Height as of this encounter: 5\' 9"  (1.753 m).   Weight as of this encounter: 64.9 kg.  Code Status: Full CODE STATUS DVT Prophylaxis:  heparin injection 5,000 Units Start: 09/25/23 0600 SCDs Start: 09/24/23 2348   Level of Care: Level of care: Progressive Family Communication: Updated patient Disposition Plan:      Remains inpatient appropriate:      Procedures:  Intubation, extubation x 2  Consultants:   Admitted by CCM  Antimicrobials:   Anti-infectives (From admission, onward)    Start     Dose/Rate Route Frequency Ordered Stop   09/29/23 2200  sulfamethoxazole-trimethoprim (BACTRIM DS) 800-160 MG per tablet 2 tablet        2 tablet Oral Every 12 hours 09/29/23 0921 10/04/23 0959   09/29/23 2200  oseltamivir (TAMIFLU) capsule 75 mg  Status:  Discontinued        75 mg Oral 2 times daily 09/29/23 0924 09/29/23 1421   09/29/23 1515  oseltamivir (TAMIFLU) capsule 75 mg        75 mg Oral 2 times daily 09/29/23 1421 09/29/23 2157   09/29/23 1015  oseltamivir (TAMIFLU) 6 MG/ML suspension 75 mg  Status:  Discontinued        75 mg Oral 2 times daily 09/29/23 0921 09/29/23 0924   09/27/23 2315  oseltamivir (TAMIFLU) 6 MG/ML suspension 75 mg  Status:  Discontinued        75 mg Per Tube 2 times daily 09/27/23 2226 09/29/23 0921   09/27/23 1245  oseltamivir (TAMIFLU) capsule 75 mg  Status:  Discontinued        75 mg Per Tube 2 times daily 09/27/23 1145 09/27/23 2226   09/27/23 1130  sulfamethoxazole-trimethoprim (BACTRIM DS) 800-160 MG per tablet 2 tablet   Status:  Discontinued        2 tablet Per Tube Every 12 hours 09/27/23 1041 09/29/23 0921   09/26/23 1630  sulfamethoxazole-trimethoprim (BACTRIM) 320 mg of trimethoprim in dextrose 5 % 500 mL IVPB  Status:  Discontinued        320 mg of trimethoprim 346.7 mL/hr over 90 Minutes Intravenous Every 12 hours 09/26/23 1536 09/27/23 1041   09/25/23 2200  cefTRIAXone (ROCEPHIN) 2 g in sodium chloride 0.9 % 100 mL IVPB  Status:  Discontinued        2  g 200 mL/hr over 30 Minutes Intravenous Every 24 hours 09/25/23 1024 09/26/23 1536   09/25/23 0000  oseltamivir (TAMIFLU) 6 MG/ML suspension 75 mg  Status:  Discontinued        75 mg Per Tube 2 times daily 09/24/23 2337 09/27/23 1145   09/24/23 2200  cefTRIAXone (ROCEPHIN) 2 g in sodium chloride 0.9 % 100 mL IVPB        2 g 200 mL/hr over 30 Minutes Intravenous  Once 09/24/23 2159 09/24/23 2317   09/24/23 2200  azithromycin (ZITHROMAX) 500 mg in sodium chloride 0.9 % 250 mL IVPB  Status:  Discontinued        500 mg 250 mL/hr over 60 Minutes Intravenous Every 24 hours 09/24/23 2159 09/26/23 1536          Medications  arformoterol  15 mcg Nebulization BID   aspirin  325 mg Oral Daily   budesonide (PULMICORT) nebulizer solution  0.5 mg Nebulization BID   Chlorhexidine Gluconate Cloth  6 each Topical Q0600   feeding supplement  1 Container Oral TID BM   heparin  5,000 Units Subcutaneous Q8H   levalbuterol  1.25 mg Nebulization Q6H   multivitamin with minerals  1 tablet Oral Daily   revefenacin  175 mcg Nebulization Daily   senna-docusate  2 tablet Oral QHS   sertraline  50 mg Oral Daily   sodium chloride flush  10-40 mL Intracatheter Q12H   sulfamethoxazole-trimethoprim  2 tablet Oral Q12H      Subjective:   Crystal Silva still reports dyspnea, significantly dyspneic with minimal activity.    Objective:   Vitals:   10/01/23 1400 10/01/23 1939 10/02/23 0316 10/02/23 1210  BP: 127/87 (!) 147/72 135/79   Pulse: 100 88    Resp: (!) 21 17   18   Temp:  97.6 F (36.4 C) 98.2 F (36.8 C) 97.9 F (36.6 C)  TempSrc:  Oral Oral Oral  SpO2: 96% 95%    Weight:      Height:       No intake or output data in the 24 hours ending 10/02/23 1249    Wt Readings from Last 3 Encounters:  10/01/23 64.9 kg  08/25/23 73.5 kg  08/11/23 76.1 kg   Physical Exam  Awake Alert, Oriented X 3, No new F.N deficits, Normal affect, frail Symmetrical Chest wall movement, diminished air entry bilaterally, remains with significant wheezing RRR,No Gallops,Rubs or new Murmurs, No Parasternal Heave +ve B.Sounds, Abd Soft, No tenderness, No rebound - guarding or rigidity. No Cyanosis, Clubbing or edema, No new Rash or bruise      Data Reviewed:  I have personally reviewed following labs    CBC Lab Results  Component Value Date   WBC 9.5 10/02/2023   RBC 3.96 10/02/2023   HGB 12.6 10/02/2023   HCT 38.8 10/02/2023   MCV 98.0 10/02/2023   MCH 31.8 10/02/2023   PLT 155 10/02/2023   MCHC 32.5 10/02/2023   RDW 13.9 10/02/2023   LYMPHSABS 0.4 (L) 09/25/2023   MONOABS 0.1 09/25/2023   EOSABS 0.0 09/25/2023   BASOSABS 0.0 09/25/2023     Last metabolic panel Lab Results  Component Value Date   NA 137 10/02/2023   K 4.5 10/02/2023   CL 98 10/02/2023   CO2 28 10/02/2023   BUN 12 10/02/2023   CREATININE 0.59 10/02/2023   GLUCOSE 99 10/02/2023   GFRNONAA >60 10/02/2023   GFRAA  05/02/2009    >60  The eGFR has been calculated using the MDRD equation. This calculation has not been validated in all clinical situations. eGFR's persistently <60 mL/min signify possible Chronic Kidney Disease.   CALCIUM 9.0 10/02/2023   PHOS 3.2 09/30/2023   PROT 6.4 (L) 09/24/2023   ALBUMIN 3.3 (L) 09/24/2023   BILITOT 0.9 09/24/2023   ALKPHOS 45 09/24/2023   AST 19 09/24/2023   ALT 14 09/24/2023   ANIONGAP 11 10/02/2023    CBG (last 3)  Recent Labs    09/30/23 2016 09/30/23 2345 10/01/23 0414  GLUCAP 102* 97 95       Coagulation Profile: No results for input(s): "INR", "PROTIME" in the last 168 hours.   Radiology Studies: I have personally reviewed the imaging studies  DG CHEST PORT 1 VIEW Result Date: 09/30/2023 CLINICAL DATA:  Dyspnea. EXAM: PORTABLE CHEST 1 VIEW COMPARISON:  September 27, 2023. FINDINGS: The heart size and mediastinal contours are within normal limits. Sternotomy wires are noted. Status post aortic valve repair. Right-sided PICC line is noted with distal tip in expected position of cavoatrial junction. Minimal bibasilar subsegmental atelectasis is noted. The visualized skeletal structures are unremarkable. IMPRESSION: Right-sided PICC line as noted above. Minimal bibasilar subsegmental atelectasis. Electronically Signed   By: Lupita Raider M.D.   On: 09/30/2023 16:32       Huey Bienenstock M.D. [facility] 10/02/2023, 12:49 PM  Available via Epic secure chat 7am-7pm After 7 pm, please refer to night coverage provider listed on amion.

## 2023-10-02 NOTE — Progress Notes (Signed)
Ok to [employer] since duplicate with Brovana per Dr. Randol Kern.  Ulyses Southward, PharmD, BCIDP, AAHIVP, CPP Infectious Disease Pharmacist 10/02/2023 3:03 PM

## 2023-10-02 NOTE — Plan of Care (Signed)

## 2023-10-02 NOTE — Progress Notes (Signed)
Nutrition Follow-up  DOCUMENTATION CODES:   Not applicable  INTERVENTION:  Continue current diet as ordered MVI with minerals daily Discontinue; Ensure Enlive po BID, each supplement provides 350 kcal and 20 grams of protein. Boost Breeze po TID, each supplement provides 250 kcal and 9 grams of protein [employer] Essentials TID, each packet mixed with 8 ounces of 2% milk provides 13 grams of protein and 260 calories.    NUTRITION DIAGNOSIS:   Increased nutrient needs related to acute illness as evidenced by estimated needs.  Ongoing with interventions in place.   GOAL:   Patient will meet greater than or equal to 90% of their needs    MONITOR:   PO intake, Supplement acceptance, Labs, Weight trends  REASON FOR ASSESSMENT:   Consult Assessment of nutrition requirement/status  ASSESSMENT:   Pt with hx of COPD and tobacco abuse (1 PPD) presented to [facility] ED with SOB and chest pain. Found to have Flu A. Intubated in ED and admitted to [facility] ICU. Patient reports that she is trying to eat more but food just does not taste good. We discussed Ensure and she does not like it. Offered boost and she is willing to try. Also discussed Carnation instant breakfast she is agreeable to try.  Reviewed appetite stimulants with her and she was not interested at this time.   2/13 - presented to ED, intubated 2/14 - transferred to [facility] ICU, extubated 2/16 - reintubated, PICC placed  2/17 - extubated 2/18 - SLP bedside evaluation, DYS 3, thin Hospital weight history: 10/01/23 0500 64.9 kg 143.08 lbs  09/30/23 0500 66.3 kg 146.17 lbs  09/26/23 0500 71.2 kg 156.97 lbs  09/25/23 0159 71.3 kg 157.19 lbs  09/24/23 1753 72.6 kg 160 lbs    Nutritionally Relevant Medications: Scheduled Meds:  Ensure Enlive  237 mL Oral BID BM   multivitamin with minerals  1 tablet Oral Daily   senna-docusate  2 tablet Oral QHS   PRN medications: Colace, Ativan, Miralax,   NUTRITION -  FOCUSED PHYSICAL EXAM:  Flowsheet Row Most Recent Value  Orbital Region Mild depletion  Upper Arm Region No depletion  Thoracic and Lumbar Region No depletion  Buccal Region No depletion  Temple Region Mild depletion  Clavicle Bone Region Mild depletion  Clavicle and Acromion Bone Region Mild depletion  Scapular Bone Region Mild depletion  Dorsal Hand Unable to assess  [mittens]  Patellar Region Severe depletion  Anterior Thigh Region Severe depletion  Posterior Calf Region Moderate depletion  Edema (RD Assessment) None  Hair Reviewed  Eyes Reviewed  Mouth Reviewed  Skin Reviewed  Nails Reviewed       Diet Order:   Diet Order             DIET DYS 3 Room service appropriate? Yes; Fluid consistency: Thin  Diet effective now                   EDUCATION NEEDS:   Not appropriate for education at this time  Skin:  Skin Assessment: Skin Integrity Issues: Skin Integrity Issues:: Stage I Stage I: Sacrum  Last BM:  2/20 type 6  Height:   Ht Readings from Last 1 Encounters:  09/24/23 5\' 9"  (1.753 m)    Weight:   Wt Readings from Last 1 Encounters:  10/01/23 64.9 kg    Ideal Body Weight:  65.9 kg  BMI:  Body mass index is 21.13 kg/m.  Estimated Nutritional Needs:   Kcal:  1600-1800 kcal/d  Protein:  80-100g/d  Fluid:  1.8-2L/d    Jamelle Haring RDN, LDN Clinical Dietitian   If unable to reach, please contact "RD Inpatient" secure chat group between 8 am-4 pm daily"

## 2023-10-03 DIAGNOSIS — J441 Chronic obstructive pulmonary disease with (acute) exacerbation: Secondary | ICD-10-CM | POA: Diagnosis not present

## 2023-10-03 DIAGNOSIS — J9601 Acute respiratory failure with hypoxia: Secondary | ICD-10-CM | POA: Diagnosis not present

## 2023-10-03 LAB — CBC
HCT: 36.7 % (ref 36.0–46.0)
Hemoglobin: 12 g/dL (ref 12.0–15.0)
MCH: 31.8 pg (ref 26.0–34.0)
MCHC: 32.7 g/dL (ref 30.0–36.0)
MCV: 97.3 fL (ref 80.0–100.0)
Platelets: 177 10*3/uL (ref 150–400)
RBC: 3.77 MIL/uL — ABNORMAL LOW (ref 3.87–5.11)
RDW: 14.2 % (ref 11.5–15.5)
WBC: 8.2 10*3/uL (ref 4.0–10.5)
nRBC: 0 % (ref 0.0–0.2)

## 2023-10-03 LAB — BASIC METABOLIC PANEL
Anion gap: 11 (ref 5–15)
BUN: 14 mg/dL (ref 8–23)
CO2: 29 mmol/L (ref 22–32)
Calcium: 9.4 mg/dL (ref 8.9–10.3)
Chloride: 98 mmol/L (ref 98–111)
Creatinine, Ser: 0.61 mg/dL (ref 0.44–1.00)
GFR, Estimated: >60 mL/min (ref >60)
Glucose, Bld: 101 mg/dL — ABNORMAL HIGH (ref 70–99)
Potassium: 4.4 mmol/L (ref 3.5–5.1)
Sodium: 138 mmol/L (ref 135–145)

## 2023-10-03 MED ORDER — GUAIFENESIN ER 600 MG PO TB12
1200.0000 mg | ORAL_TABLET | Freq: Two times a day (BID) | ORAL | Status: DC
  Administered 2023-10-03 – 2023-10-05 (×6): 1200 mg via ORAL
  Filled 2023-10-03 (×6): qty 2

## 2023-10-03 NOTE — Plan of Care (Signed)

## 2023-10-03 NOTE — Progress Notes (Signed)
 [facility]                                                                              Crystal Silva, is a 63 y.o. female, DOB - 1961/06/12, [id number] Admit date - 09/24/2023    Outpatient Primary MD for the patient is Benita Stabile, MD  LOS - 9  days  Chief Complaint  Patient presents with   Shortness of Breath   Chest Pain       Brief summary   Patient is a 63 year old female with bicuspid AV with severe AS (s/p bioprosthetic AVR 2022), COPD (on 2L HOT), tobacco use, RUL pulmonary nodule initially presented to [facility] ED with SOB/CP x 2 days. On arrival to ED, she was afebrile, HR 79, BP 161/81, tachypneic to 29-30, SpO2 96%. Reportedly became hypoxic with EMS with associated lethargy and transient bradycardia to 30s. Endorsed SOB/CP, denied other URI symptoms. +Sick contact (grandson). Labs were notable for WBC 3.5, Hgb 15.1, Plt 84 (136 one month PTA). Na 143, K 4.1, CO2 41, Cr 0.40. LFTs grossly WNL. Trop 4 > 6. BNP 50. VBG 7.36/pCO2 94/bicarb 53.0. D-dimer 2.81. LA 1.0. +Flu A positive. CTA Chest 2/13 negative for PE, +mild airspace with tree-in-bud nodular opacities RML, bilateral scattered pulmonary nodules up to 9mm (new from prior), emphysema.   Patient was initially tolerating BiPAP, but clinically decompensated from a respiratory standpoint prompting intubation. Intubated in [facility] ED.   Marland Kitchen  Patient was referred to [facility], admitted by CCM. Significant Hospital Events:   2/13: Transferred from [facility] to [facility] for higher level of care. Flu A+. Intubated in ED. PCCM consulted for ICU admission.  2/14:  Extubated late afternoon, overnight had resp distress - placed on bipap, anxiety issues 2/15: Started on IV Bactrim  2/16: Re-intubated d/t hypercarbia and unable to tolerate BiPAP  2/17: Extubated. Stopped Solumedrol 2/19: Changed to progressive care, TRH assumed care.  On 6 L O2 via [place], still short of breath.  Assessment & Plan    Principal Problem: Acute hypoxic and  hypercapnic respiratory failure (HCC) COPD with acute exacerbation -Initially intubated on 2/13 on admission, extubated on 2/14 however had to be reintubated on 2/16 due to hypercarbia and unable to tolerate BiPAP.  Extubated again on 2/17 -at baseline on 3 L O2 via [place] outpatient  -Sitting upright, still has wheezing, continue Pulmicort, Brovana, Xopenex, Yupelri  -Continue incentive spirometry, on Bactrim -IV steroids were stopped on 2/17 due to concern for steroid-induced psychosis resulting in anxiety and agitation. -Continue O2, wean as tolerated -2D echo 2/14 showed EF of 65 to 70%, no regional WMA, G1 DD mild to moderate TR -CTA chest 2/13 negative for PE -Anxious, increased Zoloft to 50 mg daily, continue IV Ativan as needed for anxiety -BNP 20.7 -Back on IV steroids, wheezing has improved today, she is having cough, productive phlegm, encouraged to use incentive spirometer, flutter valve, started on Mucinex as well   Flu A infection -Completed Tamiflu x 5 days  Pneumonia - stenotrophomonas -Initially intubated on 2/13 on admission, extubated on 2/14 however had to be reintubated on 2/16 due to hypercarbia and unable to  tolerate BiPAP.  Extubated again on 2/17 -IV Bactrim started on 2/15, now transitioned to p.o. Bactrim   Anxiety, ?  ICU delirium -IV steroids were discontinued on 2/17 due to concern for steroid-induced psychosis resulting in anxiety and agitation -Continue as needed Ativan for anxiety, continue Zoloft, increased to 50 mg daily -Monitor closely now she is back on IV steroids.    Bicuspid AV Severe AS s/p MVR 05/2021 (bioprosthetic) -Previous MVR with Dr. Dorris Fetch 2022, on ASA 325 mg at home.  -2D echo showed EF of 65-70% with G1DD (new), no regional wall motion abnormalities.  -Continue aspirin 325 mg  - resume home metoprolol when able   Tobacco use Pulmonary nodules -CTA Chest showed multiple bilateral pulmonary nodules measuring up to 9mm, increased  from prior. - Outpatient follow up for lung cancer screening/nodule characterization - Encourage smoking cessation   Chronic thrombocytopenia -Stable, continue to monitor    Severe Protein Calorie malnutrition -Due to critical illness -Tube feeds stopped on 2/17 -SLP evaluation on 2/18, continue dysphagia 3 diet    Pressure Injury Documentation: Sacrum with stage I, POA Foam dressing per shift, wound care    Estimated body mass index is 21.06 kg/m as calculated from the following:   Height as of this encounter: 5\' 9"  (1.753 m).   Weight as of this encounter: 64.7 kg.  Code Status: Full CODE STATUS DVT Prophylaxis:  heparin injection 5,000 Units Start: 09/25/23 0600 SCDs Start: 09/24/23 2348   Level of Care: Level of care: Progressive Family Communication: Updated patient Disposition Plan:      Remains inpatient appropriate:      Procedures:  Intubation, extubation x 2  Consultants:   Admitted by CCM  Antimicrobials:   Anti-infectives (From admission, onward)    Start     Dose/Rate Route Frequency Ordered Stop   09/29/23 2200  sulfamethoxazole-trimethoprim (BACTRIM DS) 800-160 MG per tablet 2 tablet        2 tablet Oral Every 12 hours 09/29/23 0921 10/04/23 0959   09/29/23 2200  oseltamivir (TAMIFLU) capsule 75 mg  Status:  Discontinued        75 mg Oral 2 times daily 09/29/23 0924 09/29/23 1421   09/29/23 1515  oseltamivir (TAMIFLU) capsule 75 mg        75 mg Oral 2 times daily 09/29/23 1421 09/29/23 2157   09/29/23 1015  oseltamivir (TAMIFLU) 6 MG/ML suspension 75 mg  Status:  Discontinued        75 mg Oral 2 times daily 09/29/23 0921 09/29/23 0924   09/27/23 2315  oseltamivir (TAMIFLU) 6 MG/ML suspension 75 mg  Status:  Discontinued        75 mg Per Tube 2 times daily 09/27/23 2226 09/29/23 0921   09/27/23 1245  oseltamivir (TAMIFLU) capsule 75 mg  Status:  Discontinued        75 mg Per Tube 2 times daily 09/27/23 1145 09/27/23 2226   09/27/23 1130   sulfamethoxazole-trimethoprim (BACTRIM DS) 800-160 MG per tablet 2 tablet  Status:  Discontinued        2 tablet Per Tube Every 12 hours 09/27/23 1041 09/29/23 0921   09/26/23 1630  sulfamethoxazole-trimethoprim (BACTRIM) 320 mg of trimethoprim in dextrose 5 % 500 mL IVPB  Status:  Discontinued        320 mg of trimethoprim 346.7 mL/hr over 90 Minutes Intravenous Every 12 hours 09/26/23 1536 09/27/23 1041   09/25/23 2200  cefTRIAXone (ROCEPHIN) 2 g in sodium chloride 0.9 % 100 mL  IVPB  Status:  Discontinued        2 g 200 mL/hr over 30 Minutes Intravenous Every 24 hours 09/25/23 1024 09/26/23 1536   09/25/23 0000  oseltamivir (TAMIFLU) 6 MG/ML suspension 75 mg  Status:  Discontinued        75 mg Per Tube 2 times daily 09/24/23 2337 09/27/23 1145   09/24/23 2200  cefTRIAXone (ROCEPHIN) 2 g in sodium chloride 0.9 % 100 mL IVPB        2 g 200 mL/hr over 30 Minutes Intravenous  Once 09/24/23 2159 09/24/23 2317   09/24/23 2200  azithromycin (ZITHROMAX) 500 mg in sodium chloride 0.9 % 250 mL IVPB  Status:  Discontinued        500 mg 250 mL/hr over 60 Minutes Intravenous Every 24 hours 09/24/23 2159 09/26/23 1536          Medications  arformoterol  15 mcg Nebulization BID   aspirin  325 mg Oral Daily   budesonide (PULMICORT) nebulizer solution  0.5 mg Nebulization BID   Chlorhexidine Gluconate Cloth  6 each Topical Q0600   feeding supplement  1 Container Oral TID BM   heparin  5,000 Units Subcutaneous Q8H   methylPREDNISolone (SOLU-MEDROL) injection  80 mg Intravenous Daily   multivitamin with minerals  1 tablet Oral Daily   pantoprazole  40 mg Oral Daily   revefenacin  175 mcg Nebulization Daily   senna-docusate  2 tablet Oral QHS   sertraline  50 mg Oral Daily   sodium chloride flush  10-40 mL Intracatheter Q12H   sulfamethoxazole-trimethoprim  2 tablet Oral Q12H      Subjective:   Crystal Silva reports some cough, dyspnea with activity, but reports she is feeling  better  Objective:   Vitals:   10/03/23 0200 10/03/23 0400 10/03/23 0501 10/03/23 0615  BP:    134/88  Pulse: 83 84  88  Resp: 20 (!) 22  20  Temp:    97.8 F (36.6 C)  TempSrc:    Oral  SpO2: 92% 90%  92%  Weight:   64.7 kg   Height:       No intake or output data in the 24 hours ending 10/03/23 1300    Wt Readings from Last 3 Encounters:  10/03/23 64.7 kg  08/25/23 73.5 kg  08/11/23 76.1 kg   Physical Exam  Awake Alert, Oriented X 3, No new F.N deficits, Normal affect Symmetrical Chest wall movement, improved air entry today, scattered wheezing RRR,No Gallops,Rubs or new Murmurs, No Parasternal Heave +ve B.Sounds, Abd Soft, No tenderness, No rebound - guarding or rigidity. No Cyanosis, Clubbing or edema, No new Rash or bruise       Data Reviewed:  I have personally reviewed following labs    CBC Lab Results  Component Value Date   WBC 8.2 10/03/2023   RBC 3.77 (L) 10/03/2023   HGB 12.0 10/03/2023   HCT 36.7 10/03/2023   MCV 97.3 10/03/2023   MCH 31.8 10/03/2023   PLT 177 10/03/2023   MCHC 32.7 10/03/2023   RDW 14.2 10/03/2023   LYMPHSABS 0.4 (L) 09/25/2023   MONOABS 0.1 09/25/2023   EOSABS 0.0 09/25/2023   BASOSABS 0.0 09/25/2023     Last metabolic panel Lab Results  Component Value Date   NA 138 10/03/2023   K 4.4 10/03/2023   CL 98 10/03/2023   CO2 29 10/03/2023   BUN 14 10/03/2023   CREATININE 0.61 10/03/2023   GLUCOSE 101 (H) 10/03/2023  GFRNONAA >60 10/03/2023   GFRAA  05/02/2009    >60        The eGFR has been calculated using the MDRD equation. This calculation has not been validated in all clinical situations. eGFR's persistently <60 mL/min signify possible Chronic Kidney Disease.   CALCIUM 9.4 10/03/2023   PHOS 3.2 09/30/2023   PROT 6.4 (L) 09/24/2023   ALBUMIN 3.3 (L) 09/24/2023   BILITOT 0.9 09/24/2023   ALKPHOS 45 09/24/2023   AST 19 09/24/2023   ALT 14 09/24/2023   ANIONGAP 11 10/03/2023    CBG (last 3)  Recent  Labs    09/30/23 2016 09/30/23 2345 10/01/23 0414  GLUCAP 102* 97 95      Coagulation Profile: No results for input(s): "INR", "PROTIME" in the last 168 hours.   Radiology Studies: I have personally reviewed the imaging studies  No results found.      Huey Bienenstock M.D. [facility] 10/03/2023, 1:00 PM  Available via Epic secure chat 7am-7pm After 7 pm, please refer to night coverage provider listed on amion.

## 2023-10-03 NOTE — Plan of Care (Signed)
  Problem: Health Behavior/Discharge Planning: Goal: Ability to manage health-related needs will improve Outcome: Progressing   Problem: Clinical Measurements: Goal: Ability to maintain clinical measurements within normal limits will improve Outcome: Progressing Goal: Will remain free from infection Outcome: Progressing Goal: Respiratory complications will improve Outcome: Progressing   Problem: Activity: Goal: Risk for activity intolerance will decrease Outcome: Progressing   Problem: Coping: Goal: Level of anxiety will decrease Outcome: Progressing   Problem: Safety: Goal: Ability to remain free from injury will improve Outcome: Progressing

## 2023-10-04 DIAGNOSIS — J441 Chronic obstructive pulmonary disease with (acute) exacerbation: Secondary | ICD-10-CM | POA: Diagnosis not present

## 2023-10-04 LAB — CBC
HCT: 36.5 % (ref 36.0–46.0)
Hemoglobin: 12 g/dL (ref 12.0–15.0)
MCH: 32.2 pg (ref 26.0–34.0)
MCHC: 32.9 g/dL (ref 30.0–36.0)
MCV: 97.9 fL (ref 80.0–100.0)
Platelets: 183 10*3/uL (ref 150–400)
RBC: 3.73 MIL/uL — ABNORMAL LOW (ref 3.87–5.11)
RDW: 14 % (ref 11.5–15.5)
WBC: 9.3 10*3/uL (ref 4.0–10.5)
nRBC: 0 % (ref 0.0–0.2)

## 2023-10-04 LAB — PHOSPHORUS: Phosphorus: 4 mg/dL (ref 2.5–4.6)

## 2023-10-04 LAB — BASIC METABOLIC PANEL
Anion gap: 10 (ref 5–15)
BUN: 17 mg/dL (ref 8–23)
CO2: 28 mmol/L (ref 22–32)
Calcium: 9 mg/dL (ref 8.9–10.3)
Chloride: 98 mmol/L (ref 98–111)
Creatinine, Ser: 0.66 mg/dL (ref 0.44–1.00)
GFR, Estimated: >60 mL/min (ref >60)
Glucose, Bld: 116 mg/dL — ABNORMAL HIGH (ref 70–99)
Potassium: 4.2 mmol/L (ref 3.5–5.1)
Sodium: 136 mmol/L (ref 135–145)

## 2023-10-04 LAB — MAGNESIUM: Magnesium: 1.8 mg/dL (ref 1.7–2.4)

## 2023-10-04 MED ORDER — PREDNISONE 20 MG PO TABS
40.0000 mg | ORAL_TABLET | Freq: Every day | ORAL | Status: DC
  Administered 2023-10-05: 40 mg via ORAL
  Filled 2023-10-04: qty 2

## 2023-10-04 NOTE — Plan of Care (Signed)
  Problem: Health Behavior/Discharge Planning: Goal: Ability to manage health-related needs will improve Outcome: Progressing   Problem: Clinical Measurements: Goal: Will remain free from infection Outcome: Progressing Goal: Respiratory complications will improve Outcome: Progressing   Problem: Activity: Goal: Risk for activity intolerance will decrease Outcome: Progressing   Problem: Safety: Goal: Ability to remain free from injury will improve Outcome: Progressing   

## 2023-10-04 NOTE — Progress Notes (Signed)
 IP rehab admissions - I met with patient this am.  She does not want rehab here at [facility].  She prefers [facility] in [place] if she has to go to a SNF.  Her first preference is to go home with outpatient or [facility] therapies, but this may not be realistic.  Patient does not have 24/7 supervision at home.  I will sign off for CIR since patient does not want to stay in [facility] for her therapies.  [phone]

## 2023-10-04 NOTE — Progress Notes (Signed)
 [facility]                                                                              Crystal Silva, is a 63 y.o. female, DOB - Sep 24, 1960, ZOX:[id number] Admit date - 09/24/2023    Outpatient Primary MD for the patient is Benita Stabile, MD  LOS - 10  days  Chief Complaint  Patient presents with   Shortness of Breath   Chest Pain       Brief summary   Patient is a 63 year old female with bicuspid AV with severe AS (s/p bioprosthetic AVR 2022), COPD (on 2L HOT), tobacco use, RUL pulmonary nodule initially presented to [facility] ED with SOB/CP x 2 days. On arrival to ED, she was afebrile, HR 79, BP 161/81, tachypneic to 29-30, SpO2 96%. Reportedly became hypoxic with EMS with associated lethargy and transient bradycardia to 30s. Endorsed SOB/CP, denied other URI symptoms. +Sick contact (grandson). Labs were notable for WBC 3.5, Hgb 15.1, Plt 84 (136 one month PTA). Na 143, K 4.1, CO2 41, Cr 0.40. LFTs grossly WNL. Trop 4 > 6. BNP 50. VBG 7.36/pCO2 94/bicarb 53.0. D-dimer 2.81. LA 1.0. +Flu A positive. CTA Chest 2/13 negative for PE, +mild airspace with tree-in-bud nodular opacities RML, bilateral scattered pulmonary nodules up to 9mm (new from prior), emphysema.   Patient was initially tolerating BiPAP, but clinically decompensated from a respiratory standpoint prompting intubation. Intubated in [facility] ED.   Marland Kitchen  Patient was referred to [facility], admitted by CCM. Significant Hospital Events:   2/13: Transferred from [facility] to [facility] for higher level of care. Flu A+. Intubated in ED. PCCM consulted for ICU admission.  2/14:  Extubated late afternoon, overnight had resp distress - placed on bipap, anxiety issues 2/15: Started on IV Bactrim  2/16: Re-intubated d/t hypercarbia and unable to tolerate BiPAP  2/17: Extubated. Stopped Solumedrol 2/19: Changed to progressive care, TRH assumed care.  On 6 L O2 via [place], still short of breath.  Assessment & Plan     Acute hypoxic and hypercapnic  respiratory failure (HCC) COPD with acute exacerbation -Initially intubated on 2/13 on admission, extubated on 2/14 however had to be reintubated on 2/16 due to hypercarbia and unable to tolerate BiPAP.  Extubated again on 2/17 -at baseline on 3 L O2 via [place] outpatient  - continue Pulmicort, Brovana, Xopenex, Yupelri  -Was encouraged use incentive spirometry and flutter valve -IV steroids were stopped on 2/17 due to concern for steroid-induced psychosis resulting in anxiety and agitation.  Resumed  again given dyspnea, no psychosis, respiratory status improving, will start tapering -Continue O2, wean as tolerated, back to baseline on 3 L oxygen -2D echo 2/14 showed EF of 65 to 70%, no regional WMA, G1 DD mild to moderate TR -CTA chest 2/13 negative for PE -Anxious, increased Zoloft to 50 mg daily, continue IV Ativan as needed for anxiety -BNP 20.7 -She is still having having cough, productive phlegm, encouraged to use incentive spirometer, flutter valve, started on Mucinex as well   Flu A infection -Completed Tamiflu x 5 days  Pneumonia - stenotrophomonas -Initially intubated on 2/13 on admission, extubated on 2/14  however had to be reintubated on 2/16 due to hypercarbia and unable to tolerate BiPAP.  Extubated again on 2/17 -Peaked with Bactrim   Anxiety, ?  ICU delirium -IV steroids were discontinued on 2/17 due to concern for steroid-induced psychosis resulting in anxiety and agitation -Continue as needed Ativan for anxiety, continue Zoloft, increased to 50 mg daily -Monitor closely now she is back on IV steroids.    Bicuspid AV Severe AS s/p MVR 05/2021 (bioprosthetic) -Previous MVR with Dr. Dorris Fetch 2022, on ASA 325 mg at home.  -2D echo showed EF of 65-70% with G1DD (new), no regional wall motion abnormalities.  -Continue aspirin 325 mg  - resume home metoprolol when able   Tobacco use Pulmonary nodules -CTA Chest showed multiple bilateral pulmonary nodules measuring up  to 9mm, increased from prior. - Outpatient follow up for lung cancer screening/nodule characterization - Encourage smoking cessation   Chronic thrombocytopenia -Stable, continue to monitor    Severe Protein Calorie malnutrition -Due to critical illness -Tube feeds stopped on 2/17 -SLP evaluation on 2/18, continue dysphagia 3 diet    Pressure Injury Documentation: Sacrum with stage I, POA Foam dressing per shift, wound care    Estimated body mass index is 24.25 kg/m as calculated from the following:   Height as of this encounter: 5\' 9"  (1.753 m).   Weight as of this encounter: 74.5 kg.  Code Status: Full CODE STATUS DVT Prophylaxis:  heparin injection 5,000 Units Start: 09/25/23 0600 SCDs Start: 09/24/23 2348   Level of Care: Level of care: Progressive Family Communication: Updated patient Disposition Plan:      Remains inpatient appropriate:      Procedures:  Intubation, extubation x 2  Consultants:   Admitted by CCM  Antimicrobials:   Anti-infectives (From admission, onward)    Start     Dose/Rate Route Frequency Ordered Stop   09/29/23 2200  sulfamethoxazole-trimethoprim (BACTRIM DS) 800-160 MG per tablet 2 tablet        2 tablet Oral Every 12 hours 09/29/23 0921 10/03/23 2103   09/29/23 2200  oseltamivir (TAMIFLU) capsule 75 mg  Status:  Discontinued        75 mg Oral 2 times daily 09/29/23 0924 09/29/23 1421   09/29/23 1515  oseltamivir (TAMIFLU) capsule 75 mg        75 mg Oral 2 times daily 09/29/23 1421 09/29/23 2157   09/29/23 1015  oseltamivir (TAMIFLU) 6 MG/ML suspension 75 mg  Status:  Discontinued        75 mg Oral 2 times daily 09/29/23 0921 09/29/23 0924   09/27/23 2315  oseltamivir (TAMIFLU) 6 MG/ML suspension 75 mg  Status:  Discontinued        75 mg Per Tube 2 times daily 09/27/23 2226 09/29/23 0921   09/27/23 1245  oseltamivir (TAMIFLU) capsule 75 mg  Status:  Discontinued        75 mg Per Tube 2 times daily 09/27/23 1145 09/27/23 2226    09/27/23 1130  sulfamethoxazole-trimethoprim (BACTRIM DS) 800-160 MG per tablet 2 tablet  Status:  Discontinued        2 tablet Per Tube Every 12 hours 09/27/23 1041 09/29/23 0921   09/26/23 1630  sulfamethoxazole-trimethoprim (BACTRIM) 320 mg of trimethoprim in dextrose 5 % 500 mL IVPB  Status:  Discontinued        320 mg of trimethoprim 346.7 mL/hr over 90 Minutes Intravenous Every 12 hours 09/26/23 1536 09/27/23 1041   09/25/23 2200  cefTRIAXone (ROCEPHIN) 2 g in  sodium chloride 0.9 % 100 mL IVPB  Status:  Discontinued        2 g 200 mL/hr over 30 Minutes Intravenous Every 24 hours 09/25/23 1024 09/26/23 1536   09/25/23 0000  oseltamivir (TAMIFLU) 6 MG/ML suspension 75 mg  Status:  Discontinued        75 mg Per Tube 2 times daily 09/24/23 2337 09/27/23 1145   09/24/23 2200  cefTRIAXone (ROCEPHIN) 2 g in sodium chloride 0.9 % 100 mL IVPB        2 g 200 mL/hr over 30 Minutes Intravenous  Once 09/24/23 2159 09/24/23 2317   09/24/23 2200  azithromycin (ZITHROMAX) 500 mg in sodium chloride 0.9 % 250 mL IVPB  Status:  Discontinued        500 mg 250 mL/hr over 60 Minutes Intravenous Every 24 hours 09/24/23 2159 09/26/23 1536          Medications  arformoterol  15 mcg Nebulization BID   aspirin  325 mg Oral Daily   budesonide (PULMICORT) nebulizer solution  0.5 mg Nebulization BID   Chlorhexidine Gluconate Cloth  6 each Topical Q0600   feeding supplement  1 Container Oral TID BM   guaiFENesin  1,200 mg Oral BID   heparin  5,000 Units Subcutaneous Q8H   methylPREDNISolone (SOLU-MEDROL) injection  80 mg Intravenous Daily   multivitamin with minerals  1 tablet Oral Daily   pantoprazole  40 mg Oral Daily   revefenacin  175 mcg Nebulization Daily   senna-docusate  2 tablet Oral QHS   sertraline  50 mg Oral Daily   sodium chloride flush  10-40 mL Intracatheter Q12H      Subjective:   Crystal Silva reports some cough, dyspnea with activity, but reports she is feeling  better  Objective:   Vitals:   10/03/23 2346 10/04/23 0500 10/04/23 0510 10/04/23 0803  BP: (!) 153/99  126/83   Pulse: 99  93   Resp: (!) 22  20   Temp: 97.7 F (36.5 C)  97.7 F (36.5 C)   TempSrc: Oral  Oral   SpO2: 93%  93% 91%  Weight:  74.5 kg    Height:        Intake/Output Summary (Last 24 hours) at 10/04/2023 1149 Last data filed at 10/03/2023 2104 Gross per 24 hour  Intake 10 ml  Output --  Net 10 ml      Wt Readings from Last 3 Encounters:  10/04/23 74.5 kg  08/25/23 73.5 kg  08/11/23 76.1 kg   Physical Exam  Awake Alert, Oriented X 3, limited frail, appears older than stated age Symmetrical Chest wall movement, improved air entry, scattered wheezing RRR,No Gallops,Rubs or new Murmurs, No Parasternal Heave +ve B.Sounds, Abd Soft, No tenderness, No rebound - guarding or rigidity. No Cyanosis, Clubbing or edema, No new Rash or bruise       Data Reviewed:  I have personally reviewed following labs    CBC Lab Results  Component Value Date   WBC 9.3 10/04/2023   RBC 3.73 (L) 10/04/2023   HGB 12.0 10/04/2023   HCT 36.5 10/04/2023   MCV 97.9 10/04/2023   MCH 32.2 10/04/2023   PLT 183 10/04/2023   MCHC 32.9 10/04/2023   RDW 14.0 10/04/2023   LYMPHSABS 0.4 (L) 09/25/2023   MONOABS 0.1 09/25/2023   EOSABS 0.0 09/25/2023   BASOSABS 0.0 09/25/2023     Last metabolic panel Lab Results  Component Value Date   NA 136 10/04/2023  K 4.2 10/04/2023   CL 98 10/04/2023   CO2 28 10/04/2023   BUN 17 10/04/2023   CREATININE 0.66 10/04/2023   GLUCOSE 116 (H) 10/04/2023   GFRNONAA >60 10/04/2023   GFRAA  05/02/2009    >60        The eGFR has been calculated using the MDRD equation. This calculation has not been validated in all clinical situations. eGFR's persistently <60 mL/min signify possible Chronic Kidney Disease.   CALCIUM 9.0 10/04/2023   PHOS 4.0 10/04/2023   PROT 6.4 (L) 09/24/2023   ALBUMIN 3.3 (L) 09/24/2023   BILITOT 0.9  09/24/2023   ALKPHOS 45 09/24/2023   AST 19 09/24/2023   ALT 14 09/24/2023   ANIONGAP 10 10/04/2023    CBG (last 3)  No results for input(s): "GLUCAP" in the last 72 hours.     Coagulation Profile: No results for input(s): "INR", "PROTIME" in the last 168 hours.   Radiology Studies: I have personally reviewed the imaging studies  No results found.      Huey Bienenstock M.D. [facility] 10/04/2023, 11:49 AM  Available via Epic secure chat 7am-7pm After 7 pm, please refer to night coverage provider listed on amion.

## 2023-10-04 NOTE — Plan of Care (Signed)
  Problem: Education: Goal: Knowledge of General Education information will improve Description: Including pain rating scale, medication(s)/side effects and non-pharmacologic comfort measures Outcome: Progressing   Problem: Health Behavior/Discharge Planning: Goal: Ability to manage health-related needs will improve Outcome: Progressing   Problem: Clinical Measurements: Goal: Will remain free from infection Outcome: Progressing Goal: Respiratory complications will improve Outcome: Progressing   Problem: Activity: Goal: Risk for activity intolerance will decrease Outcome: Progressing   

## 2023-10-05 DIAGNOSIS — J441 Chronic obstructive pulmonary disease with (acute) exacerbation: Secondary | ICD-10-CM | POA: Diagnosis not present

## 2023-10-05 DIAGNOSIS — J9602 Acute respiratory failure with hypercapnia: Secondary | ICD-10-CM | POA: Diagnosis not present

## 2023-10-05 LAB — GLUCOSE, CAPILLARY
Glucose-Capillary: 103 mg/dL — ABNORMAL HIGH (ref 70–99)
Glucose-Capillary: 125 mg/dL — ABNORMAL HIGH (ref 70–99)

## 2023-10-05 MED ORDER — PREDNISONE 5 MG PO TABS: 30.0000 mg | ORAL_TABLET | Freq: Every day | ORAL | Status: DC

## 2023-10-05 NOTE — Progress Notes (Signed)
 [facility]                                                                              Crystal Silva, is a 63 y.o. female, DOB - February 12, 1961, ZOX:[id number] Admit date - 09/24/2023    Outpatient Primary MD for the patient is Benita Stabile, MD  LOS - 11  days  Chief Complaint  Patient presents with   Shortness of Breath   Chest Pain       Brief summary   Patient is a 63 year old female with bicuspid AV with severe AS (s/p bioprosthetic AVR 2022), COPD (on 2L HOT), tobacco use, RUL pulmonary nodule initially presented to [facility] ED with SOB/CP x 2 days. On arrival to ED, she was afebrile, HR 79, BP 161/81, tachypneic to 29-30, SpO2 96%. Reportedly became hypoxic with EMS with associated lethargy and transient bradycardia to 30s. Endorsed SOB/CP, denied other URI symptoms. +Sick contact (grandson). Labs were notable for WBC 3.5, Hgb 15.1, Plt 84 (136 one month PTA). Na 143, K 4.1, CO2 41, Cr 0.40. LFTs grossly WNL. Trop 4 > 6. BNP 50. VBG 7.36/pCO2 94/bicarb 53.0. D-dimer 2.81. LA 1.0. +Flu A positive. CTA Chest 2/13 negative for PE, +mild airspace with tree-in-bud nodular opacities RML, bilateral scattered pulmonary nodules up to 9mm (new from prior), emphysema.   Patient was initially tolerating BiPAP, but clinically decompensated from a respiratory standpoint prompting intubation. Intubated in [facility] ED.   Marland Kitchen  Patient was referred to [facility], admitted by CCM. Significant Hospital Events:   2/13: Transferred from [facility] to [facility] for higher level of care. Flu A+. Intubated in ED. PCCM consulted for ICU admission.  2/14:  Extubated late afternoon, overnight had resp distress - placed on bipap, anxiety issues 2/15: Started on IV Bactrim  2/16: Re-intubated d/t hypercarbia and unable to tolerate BiPAP  2/17: Extubated. Stopped Solumedrol 2/19: Changed to progressive care, TRH assumed care.  On 6 L O2 via [place], still short of breath.  Assessment & Plan     Acute hypoxic and hypercapnic  respiratory failure (HCC) COPD with acute exacerbation -Initially intubated on 2/13 on admission, extubated on 2/14 however had to be reintubated on 2/16 due to hypercarbia and unable to tolerate BiPAP.  Extubated again on 2/17 -at baseline on 3 L O2 via [place] outpatient  - continue Pulmicort, Brovana, Xopenex, Yupelri  -Was encouraged use incentive spirometry and flutter valve -IV steroids were stopped on 2/17 due to concern for steroid-induced psychosis resulting in anxiety and agitation.  Resumed  again given dyspnea, no psychosis, respiratory status improving, will start tapering decrease to 30 mg p.o. prednisone today. -Continue O2, wean as tolerated, back to baseline on 3 L oxygen -2D echo 2/14 showed EF of 65 to 70%, no regional WMA, G1 DD mild to moderate TR -CTA chest 2/13 negative for PE -Anxious, increased Zoloft to 50 mg daily, continue IV Ativan as needed for anxiety -BNP 20.7 -Cough, phlegm much improved with incentive spirometer, flutter valve and Mucinex   Flu A infection -Completed Tamiflu x 5 days  Pneumonia - stenotrophomonas -Initially intubated on 2/13 on admission, extubated on 2/14 however had  to be reintubated on 2/16 due to hypercarbia and unable to tolerate BiPAP.  Extubated again on 2/17 -Treated with Bactrim   Anxiety, ?  ICU delirium -IV steroids were discontinued on 2/17 due to concern for steroid-induced psychosis resulting in anxiety and agitation -Continue as needed Ativan for anxiety, continue Zoloft, increased to 50 mg daily     Bicuspid AV Severe AS s/p MVR 05/2021 (bioprosthetic) -Previous MVR with Dr. Dorris Fetch 2022, on ASA 325 mg at home.  -2D echo showed EF of 65-70% with G1DD (new), no regional wall motion abnormalities.  -Continue aspirin 325 mg  - resume home metoprolol when able   Tobacco use Pulmonary nodules -CTA Chest showed multiple bilateral pulmonary nodules measuring up to 9mm, increased from prior. - Outpatient follow up for  lung cancer screening/nodule characterization - Encourage smoking cessation -Patient needs to follow-up with her pulmonary team as an outpatient for further workup   Chronic thrombocytopenia -Stable, continue to monitor    Severe Protein Calorie malnutrition -Due to critical illness -Tube feeds stopped on 2/17 -SLP evaluation on 2/18, continue dysphagia 3 diet    Pressure Injury Documentation: Sacrum with stage I, POA Foam dressing per shift, wound care    Estimated body mass index is 24.03 kg/m as calculated from the following:   Height as of this encounter: 5\' 9"  (1.753 m).   Weight as of this encounter: 73.8 kg.  Code Status: Full CODE STATUS DVT Prophylaxis:  heparin injection 5,000 Units Start: 09/25/23 0600 SCDs Start: 09/24/23 2348   Level of Care: Level of care: Progressive Family Communication: Updated patient Disposition Plan:      Patient ready for discharge when SNF bed is available   Procedures:  Intubation, extubation x 2  Consultants:   Admitted by CCM  Antimicrobials:   Anti-infectives (From admission, onward)    Start     Dose/Rate Route Frequency Ordered Stop   09/29/23 2200  sulfamethoxazole-trimethoprim (BACTRIM DS) 800-160 MG per tablet 2 tablet        2 tablet Oral Every 12 hours 09/29/23 0921 10/03/23 2103   09/29/23 2200  oseltamivir (TAMIFLU) capsule 75 mg  Status:  Discontinued        75 mg Oral 2 times daily 09/29/23 0924 09/29/23 1421   09/29/23 1515  oseltamivir (TAMIFLU) capsule 75 mg        75 mg Oral 2 times daily 09/29/23 1421 09/29/23 2157   09/29/23 1015  oseltamivir (TAMIFLU) 6 MG/ML suspension 75 mg  Status:  Discontinued        75 mg Oral 2 times daily 09/29/23 0921 09/29/23 0924   09/27/23 2315  oseltamivir (TAMIFLU) 6 MG/ML suspension 75 mg  Status:  Discontinued        75 mg Per Tube 2 times daily 09/27/23 2226 09/29/23 0921   09/27/23 1245  oseltamivir (TAMIFLU) capsule 75 mg  Status:  Discontinued        75 mg Per Tube  2 times daily 09/27/23 1145 09/27/23 2226   09/27/23 1130  sulfamethoxazole-trimethoprim (BACTRIM DS) 800-160 MG per tablet 2 tablet  Status:  Discontinued        2 tablet Per Tube Every 12 hours 09/27/23 1041 09/29/23 0921   09/26/23 1630  sulfamethoxazole-trimethoprim (BACTRIM) 320 mg of trimethoprim in dextrose 5 % 500 mL IVPB  Status:  Discontinued        320 mg of trimethoprim 346.7 mL/hr over 90 Minutes Intravenous Every 12 hours 09/26/23 1536 09/27/23 1041   09/25/23  2200  cefTRIAXone (ROCEPHIN) 2 g in sodium chloride 0.9 % 100 mL IVPB  Status:  Discontinued        2 g 200 mL/hr over 30 Minutes Intravenous Every 24 hours 09/25/23 1024 09/26/23 1536   09/25/23 0000  oseltamivir (TAMIFLU) 6 MG/ML suspension 75 mg  Status:  Discontinued        75 mg Per Tube 2 times daily 09/24/23 2337 09/27/23 1145   09/24/23 2200  cefTRIAXone (ROCEPHIN) 2 g in sodium chloride 0.9 % 100 mL IVPB        2 g 200 mL/hr over 30 Minutes Intravenous  Once 09/24/23 2159 09/24/23 2317   09/24/23 2200  azithromycin (ZITHROMAX) 500 mg in sodium chloride 0.9 % 250 mL IVPB  Status:  Discontinued        500 mg 250 mL/hr over 60 Minutes Intravenous Every 24 hours 09/24/23 2159 09/26/23 1536          Medications  arformoterol  15 mcg Nebulization BID   aspirin  325 mg Oral Daily   budesonide (PULMICORT) nebulizer solution  0.5 mg Nebulization BID   Chlorhexidine Gluconate Cloth  6 each Topical Q0600   feeding supplement  1 Container Oral TID BM   guaiFENesin  1,200 mg Oral BID   heparin  5,000 Units Subcutaneous Q8H   multivitamin with minerals  1 tablet Oral Daily   pantoprazole  40 mg Oral Daily   predniSONE  40 mg Oral Q breakfast   revefenacin  175 mcg Nebulization Daily   senna-docusate  2 tablet Oral QHS   sertraline  50 mg Oral Daily   sodium chloride flush  10-40 mL Intracatheter Q12H      Subjective:   Crystal Silva reports dyspnea and cough has improved, still reports weakness.  Objective:    Vitals:   10/05/23 0500 10/05/23 0540 10/05/23 0833 10/05/23 0851  BP:  117/73  116/89  Pulse:  90 94 90  Resp:  18  16  Temp:  97.8 F (36.6 C)  97.9 F (36.6 C)  TempSrc:  Oral  Oral  SpO2:  93% 95% 98%  Weight: 73.8 kg     Height:        Intake/Output Summary (Last 24 hours) at 10/05/2023 1122 Last data filed at 10/05/2023 1610 Gross per 24 hour  Intake 250 ml  Output --  Net 250 ml      Wt Readings from Last 3 Encounters:  10/05/23 73.8 kg  08/25/23 73.5 kg  08/11/23 76.1 kg   Physical Exam   Awake Alert, Oriented X 3, frail, appears older than stated age Symmetrical Chest wall movement, diminished air entry, minimal end expiratory wheeze, no respiratory distress RRR,No Gallops,Rubs or new Murmurs, No Parasternal Heave +ve B.Sounds, Abd Soft, No tenderness, No rebound - guarding or rigidity. No Cyanosis, Clubbing or edema, No new Rash or bruise      Data Reviewed:  I have personally reviewed following labs    CBC Lab Results  Component Value Date   WBC 9.3 10/04/2023   RBC 3.73 (L) 10/04/2023   HGB 12.0 10/04/2023   HCT 36.5 10/04/2023   MCV 97.9 10/04/2023   MCH 32.2 10/04/2023   PLT 183 10/04/2023   MCHC 32.9 10/04/2023   RDW 14.0 10/04/2023   LYMPHSABS 0.4 (L) 09/25/2023   MONOABS 0.1 09/25/2023   EOSABS 0.0 09/25/2023   BASOSABS 0.0 09/25/2023     Last metabolic panel Lab Results  Component Value Date  NA 136 10/04/2023   K 4.2 10/04/2023   CL 98 10/04/2023   CO2 28 10/04/2023   BUN 17 10/04/2023   CREATININE 0.66 10/04/2023   GLUCOSE 116 (H) 10/04/2023   GFRNONAA >60 10/04/2023   GFRAA  05/02/2009    >60        The eGFR has been calculated using the MDRD equation. This calculation has not been validated in all clinical situations. eGFR's persistently <60 mL/min signify possible Chronic Kidney Disease.   CALCIUM 9.0 10/04/2023   PHOS 4.0 10/04/2023   PROT 6.4 (L) 09/24/2023   ALBUMIN 3.3 (L) 09/24/2023   BILITOT 0.9  09/24/2023   ALKPHOS 45 09/24/2023   AST 19 09/24/2023   ALT 14 09/24/2023   ANIONGAP 10 10/04/2023    CBG (last 3)  Recent Labs    10/05/23 0847  GLUCAP 103*       Coagulation Profile: No results for input(s): "INR", "PROTIME" in the last 168 hours.   Radiology Studies: I have personally reviewed the imaging studies  No results found.      Huey Bienenstock M.D. [facility] 10/05/2023, 11:22 AM  Available via Epic secure chat 7am-7pm After 7 pm, please refer to night coverage provider listed on amion.

## 2023-10-05 NOTE — TOC Progression Note (Signed)
 Transition of Care [facility]) - Progression Note    Patient Details  Name: CHRISTINA GINTZ MRN: [id number] Date of Birth: 15-Oct-1960  Transition of Care [facility]) CM/SW Contact  Jessie Foot, RN Phone Number: 10/05/2023, 11:00 am  Clinical Narrative:    Patient given accepted SNF bed offers.    Expected Discharge Plan: Skilled Nursing Facility Barriers to Discharge: Continued Medical Work up, English as a second language teacher, SNF Pending bed offer  Expected Discharge Plan and Services In-house Referral: Clinical Social Work Discharge Planning Services: [employer] Consult Post Acute Care Choice: Skilled Nursing Facility Living arrangements for the past 2 months: Single Family Home                 DME Arranged: N/A DME Agency: [employer]                   Social Determinants of Health (SDOH) Interventions SDOH Screenings   Food Insecurity: Patient Unable To Answer (09/25/2023)  Housing: Patient Unable To Answer (09/25/2023)  Transportation Needs: Patient Unable To Answer (09/25/2023)  Utilities: Patient Unable To Answer (09/25/2023)  Social Connections: Socially Isolated (08/14/2023)  Tobacco Use: High Risk (09/24/2023)    Readmission Risk Interventions     No data to display

## 2023-10-05 NOTE — Plan of Care (Signed)
  Problem: Education: Goal: Knowledge of General Education information will improve Description: Including pain rating scale, medication(s)/side effects and non-pharmacologic comfort measures Outcome: Progressing   Problem: Clinical Measurements: Goal: Will remain free from infection Outcome: Progressing Goal: Respiratory complications will improve Outcome: Progressing   Problem: Activity: Goal: Risk for activity intolerance will decrease Outcome: Progressing   Problem: Safety: Goal: Ability to remain free from injury will improve Outcome: Progressing   Problem: Skin Integrity: Goal: Risk for impaired skin integrity will decrease Outcome: Progressing   

## 2023-10-05 NOTE — Progress Notes (Signed)
 Physical Therapy Treatment Patient Details Name: Crystal Silva MRN: [id number] DOB: 12/22/1960 Today's Date: 10/05/2023   History of Present Illness 63 y.o. female presents to [facility] hospital on 2/14 as a transfer from [facility] due to respiratory failure. Pt intubated in [facility] ED, extubated on 2/14 but requiring re-intubation on 2/16. Extubated 2/17. PMH includes bicuspid AV with severe AS, COPD, RUL pulmonary nodule.    PT Comments  Pt tolerated treatment well today. Pt was able to progress ambulation with RW in hallway CGA +2 for chair follow. VSS on 3L. DC recs updated to HHPT with RW upon DC. PT will continue to follow.     If plan is discharge home, recommend the following: A little help with walking and/or transfers;A little help with bathing/dressing/bathroom;Assistance with cooking/housework;Direct supervision/assist for medications management;Direct supervision/assist for financial management;Assist for transportation;Help with stairs or ramp for entrance;Supervision due to cognitive status   Can travel by private vehicle        Equipment Recommendations  Rolling walker (2 wheels)    Recommendations for Other Services       Precautions / Restrictions Precautions Precautions: Fall;Other (comment) Recall of Precautions/Restrictions: Intact Precaution/Restrictions Comments: monitor O2, droplet Restrictions Weight Bearing Restrictions Per Provider Order: No     Mobility  Bed Mobility               General bed mobility comments: Seated EOB    Transfers Overall transfer level: Needs assistance Equipment used: Rolling walker (2 wheels) Transfers: Sit to/from Stand Sit to Stand: Contact guard assist           General transfer comment: Cues for hand placement. CGA for safety and assistance with lines and leads.    Ambulation/Gait Ambulation/Gait assistance: Contact guard assist, +2 safety/equipment (Chair follow) Gait Distance (Feet): 150 Feet Assistive  device: Rolling walker (2 wheels) Gait Pattern/deviations: Step-through pattern, Decreased stride length, Trunk flexed, Narrow base of support Gait velocity: dec     General Gait Details: pt able to progess ambulation today in hallway. Chair follow provided for safety however ultimately not needed.   Stairs             Wheelchair Mobility     Tilt Bed    Modified Rankin (Stroke Patients Only)       Balance Overall balance assessment: Needs assistance Sitting-balance support: Feet supported, No upper extremity supported Sitting balance-Leahy Scale: Good     Standing balance support: Bilateral upper extremity supported, Reliant on assistive device for balance Standing balance-Leahy Scale: Poor Standing balance comment: hands on RW                            Communication Communication Communication: No apparent difficulties  Cognition Arousal: Alert Behavior During Therapy: WFL for tasks assessed/performed   PT - Cognitive impairments: No apparent impairments                         Following commands: Intact      Cueing Cueing Techniques: Verbal cues, Tactile cues  Exercises      General Comments General comments (skin integrity, edema, etc.): VSS on 3L.      Pertinent Vitals/Pain Pain Assessment Pain Assessment: No/denies pain    Home Living                          Prior Function  PT Goals (current goals can now be found in the care plan section) Progress towards PT goals: Progressing toward goals    Frequency    Min 1X/week      PT Plan      Co-evaluation              AM-PAC PT "6 Clicks" Mobility   Outcome Measure  Help needed turning from your back to your side while in a flat bed without using bedrails?: A Little Help needed moving from lying on your back to sitting on the side of a flat bed without using bedrails?: A Little Help needed moving to and from a bed to a chair  (including a wheelchair)?: A Lot Help needed standing up from a chair using your arms (e.g., wheelchair or bedside chair)?: A Little Help needed to walk in hospital room?: A Little Help needed climbing 3-5 steps with a railing? : A Lot 6 Click Score: 16    End of Session Equipment Utilized During Treatment: Gait belt;Oxygen Activity Tolerance: Patient tolerated treatment well Patient left: in bed;with call bell/phone within reach Nurse Communication: Mobility status PT Visit Diagnosis: Other abnormalities of gait and mobility (R26.89);Muscle weakness (generalized) (M62.81)     Time: 9604-5409 PT Time Calculation (min) (ACUTE ONLY): 22 min  Charges:    $Gait Training: 8-22 mins PT General Charges $$ ACUTE PT VISIT: 1 Visit                     Shela Nevin, PT, DPT Acute Rehab Services [phone]    Gladys Damme 10/05/2023, 12:25 PM

## 2023-10-05 NOTE — TOC Progression Note (Signed)
 Transition of Care [facility]) - Progression Note    Patient Details  Name: Crystal Silva MRN: [id number] Date of Birth: Feb 28, 1961  Transition of Care [facility]) CM/SW Contact  Gordy Clement, RN Phone Number: 10/05/2023, 2:10 PM  Clinical Narrative:     RNCM received chat that Patient is being recommended home health. Patient will have to go to OP therapy due to no accepting home health agencies. Referral made to [facility] OP Services. A rolling walker and BSC will be delivered bedside for patient by [employer].     AVS updated TOC will continue to follow patient for any additional discharge needs      Expected Discharge Plan: Skilled Nursing Facility Barriers to Discharge: Continued Medical Work up, English as a second language teacher, SNF Pending bed offer  Expected Discharge Plan and Services In-house Referral: Clinical Social Work Discharge Planning Services: [employer] Consult Post Acute Care Choice: Skilled Nursing Facility Living arrangements for the past 2 months: Single Family Home                 DME Arranged: N/A DME Agency: [employer]                   Social Determinants of Health (SDOH) Interventions SDOH Screenings   Food Insecurity: Patient Unable To Answer (09/25/2023)  Housing: Patient Unable To Answer (09/25/2023)  Transportation Needs: Patient Unable To Answer (09/25/2023)  Utilities: Patient Unable To Answer (09/25/2023)  Social Connections: Socially Isolated (08/14/2023)  Tobacco Use: High Risk (09/24/2023)    Readmission Risk Interventions     No data to display

## 2023-10-06 ENCOUNTER — Other Ambulatory Visit (HOSPITAL_COMMUNITY): Payer: Self-pay

## 2023-10-06 DIAGNOSIS — J9622 Acute and chronic respiratory failure with hypercapnia: Secondary | ICD-10-CM | POA: Diagnosis not present

## 2023-10-06 IMAGING — DX DG CHEST 2V
2 series · 2 of 2 positions shown · non-contrast
Comparison: 05/30/2021

CLINICAL DATA: Post AVR

EXAM:
CHEST - 2 VIEW

[dg chest 2 view (1 of 2)]
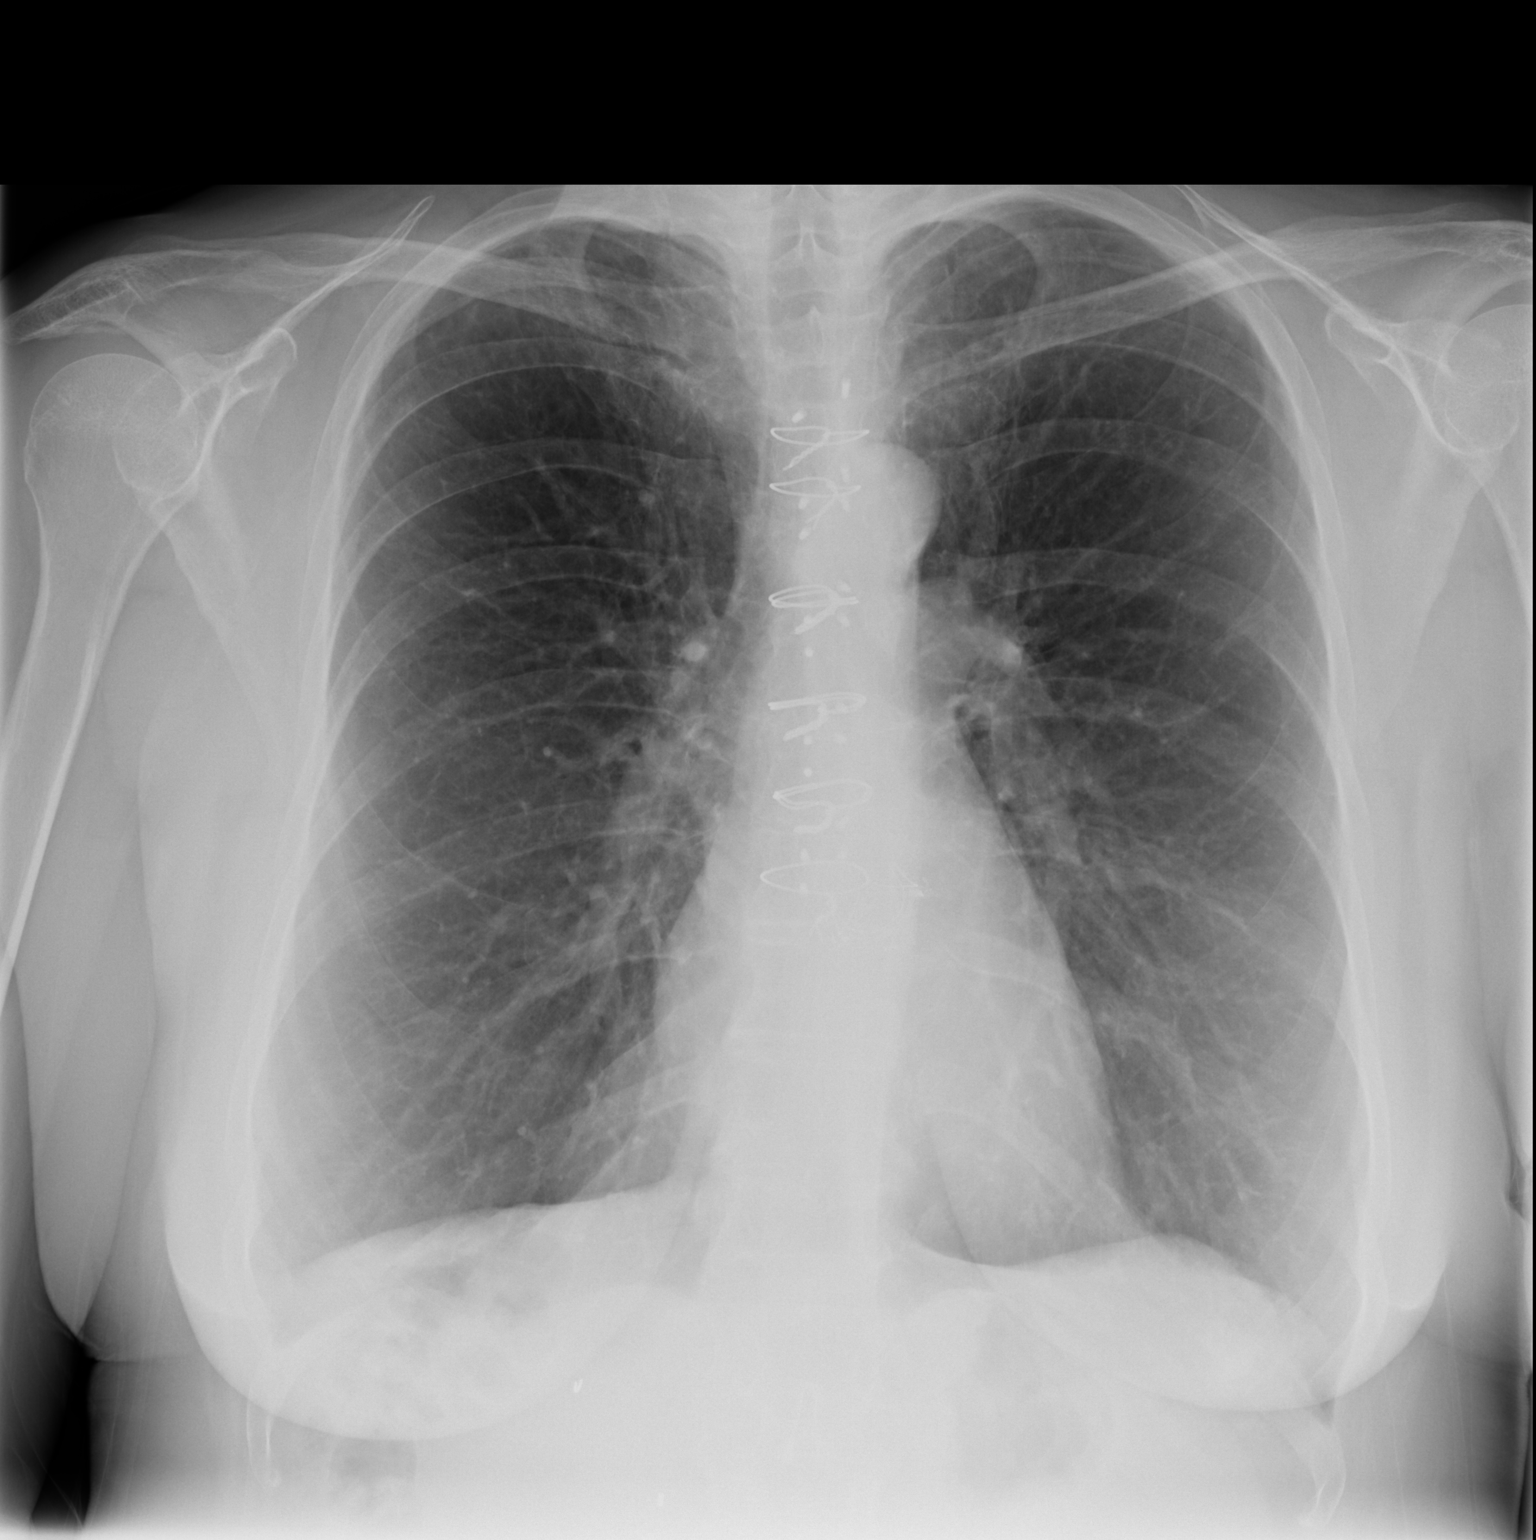

[dg chest 2 view (2 of 2)]
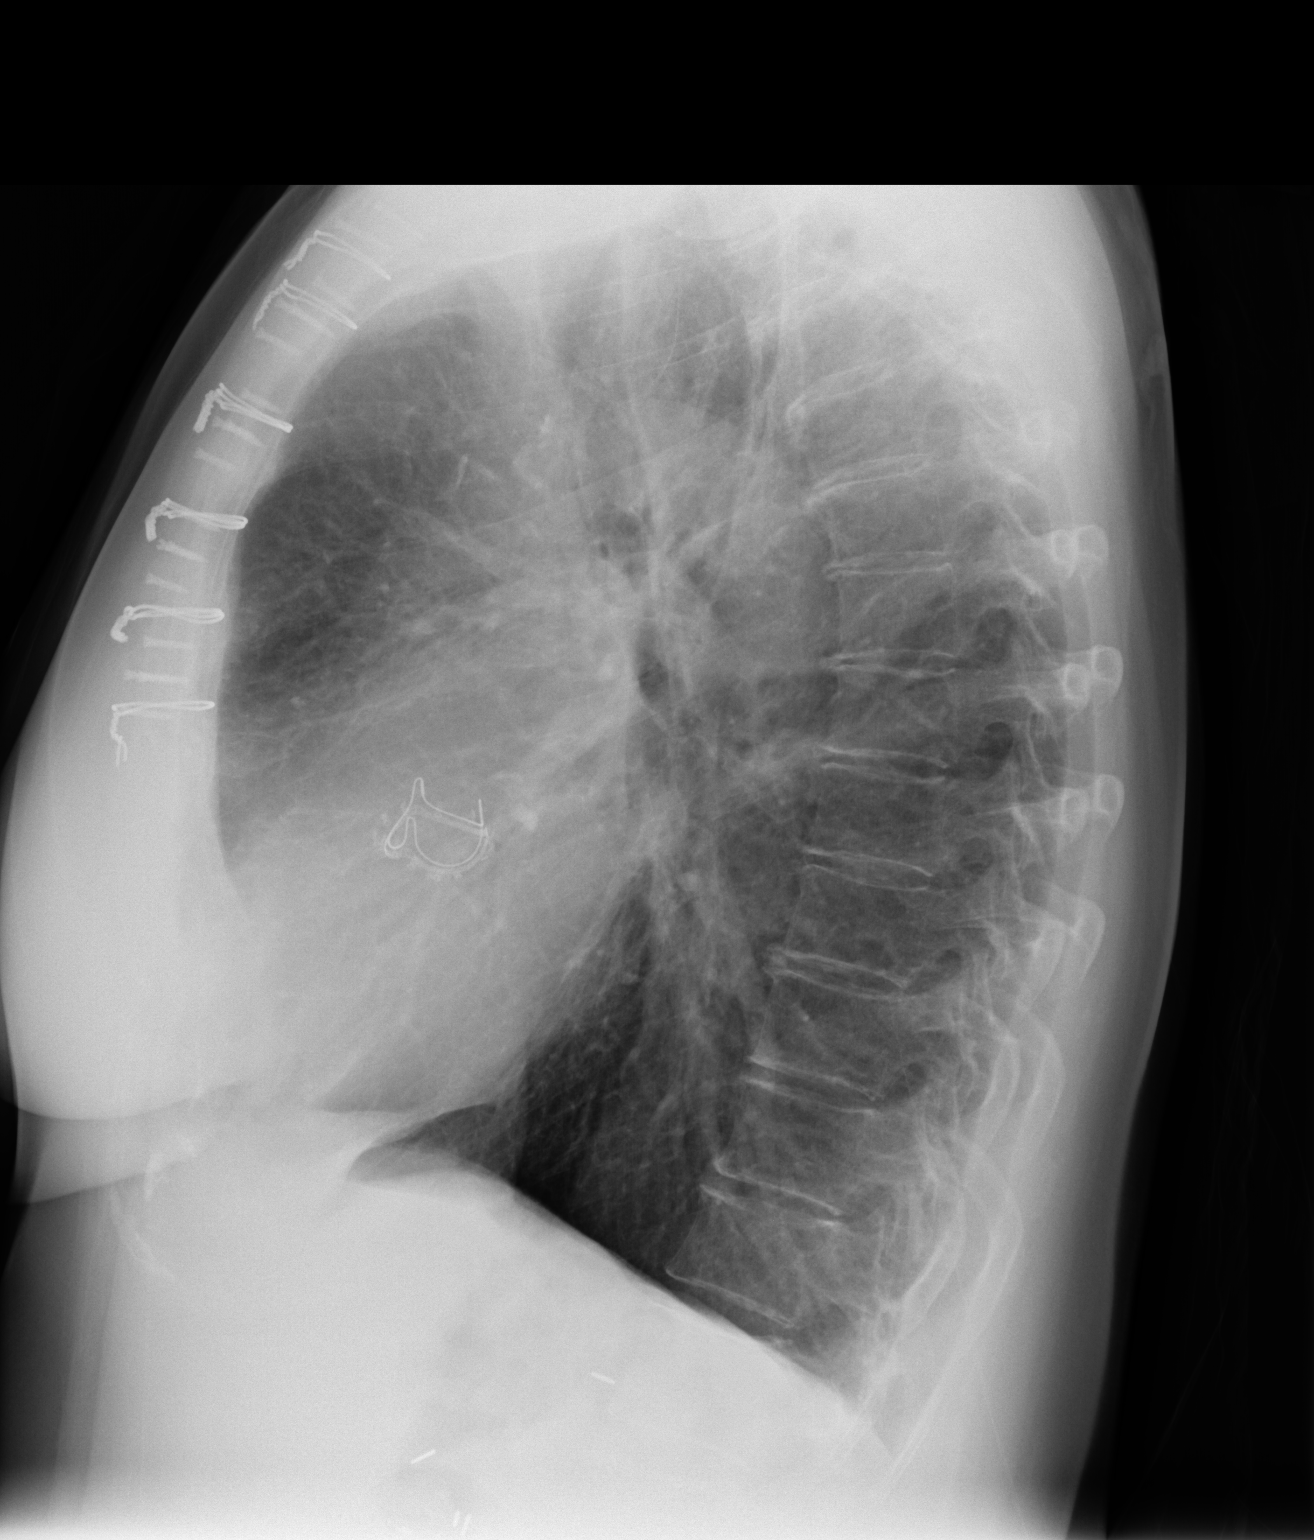

[2 of 2 positions shown; findings below may reference images not displayed]

FINDINGS: Normal heart size post AVR.

Mediastinal contours and pulmonary vascularity normal.

Lungs clear.

Resolution of previously identified RIGHT apex pneumothorax.

No pulmonary infiltrate, pleural effusion, or acute osseous
findings.
IMPRESSION: Postoperative changes of AVR.

Resolution of previously identified RIGHT apex pneumothorax and
basilar atelectasis.

## 2023-10-06 MED ORDER — PREDNISONE 10 MG (21) PO TBPK
ORAL_TABLET | ORAL | 0 refills | Status: DC
  Filled 2023-10-06: qty 21, 6d supply, fill #0

## 2023-10-06 MED ORDER — PANTOPRAZOLE SODIUM 40 MG PO TBEC
40.0000 mg | DELAYED_RELEASE_TABLET | Freq: Every day | ORAL | 0 refills | Status: DC
  Filled 2023-10-06: qty 30, 30d supply, fill #0

## 2023-10-06 NOTE — Discharge Summary (Signed)
 Physician Discharge Summary  Crystal Silva ZOX:[id number] DOB: 1961/07/14 DOA: 09/24/2023  PCP: Benita Stabile, MD  Admit date: 09/24/2023 Discharge date: 10/06/2023  Admitted From: (Home) Disposition:  (Home)  Recommendations for Outpatient Follow-up:  Follow up with PCP in 1-2 weeks Please obtain BMP/CBC in one week Patient will need pulmonary follow-up as an outpatient regarding findings of pulmonary nodules, this was discussed with the patient   Diet recommendation: Regular diet  Brief/Interim Summary:   Patient is a 63 year old female with bicuspid AV with severe AS (s/p bioprosthetic AVR 2022), COPD (on 2L HOT), tobacco use, RUL pulmonary nodule initially presented to [facility] ED with SOB/CP x 2 days. On arrival to ED, she was afebrile, HR 79, BP 161/81, tachypneic to 29-30, SpO2 96%. Reportedly became hypoxic with EMS with associated lethargy and transient bradycardia to 30s. Endorsed SOB/CP, denied other URI symptoms. +Sick contact (grandson). Labs were notable for WBC 3.5, Hgb 15.1, Plt 84 (136 one month PTA). Na 143, K 4.1, CO2 41, Cr 0.40. LFTs grossly WNL. Trop 4 > 6. BNP 50. VBG 7.36/pCO2 94/bicarb 53.0. D-dimer 2.81. LA 1.0. +Flu A positive. CTA Chest 2/13 negative for PE, +mild airspace with tree-in-bud nodular opacities RML, bilateral scattered pulmonary nodules up to 9mm (new from prior), emphysema.   Patient was initially tolerating BiPAP, but clinically decompensated from a respiratory standpoint prompting intubation. Intubated in [facility] ED.   Marland Kitchen  Patient was referred to [facility], admitted by CCM. Significant Hospital Events:    2/13: Transferred from [facility] to [facility] for higher level of care. Flu A+. Intubated in ED. PCCM consulted for ICU admission.  2/14:  Extubated late afternoon, overnight had resp distress - placed on bipap, anxiety issues 2/15: Started on IV Bactrim  2/16: Re-intubated d/t hypercarbia and unable to tolerate BiPAP  2/17: Extubated. Stopped Solumedrol 2/19: Changed to  progressive care, TR[facility] assumed care.  On 6 L O2 via [place], still short of breath.  Procedures:  Intubation, extubation x 2   Acute hypoxic and hypercapnic respiratory failure (HCC) COPD with acute exacerbation -Initially intubated on 2/13 on admission, extubated on 2/14 however had to be reintubated on 2/16 due to hypercarbia and unable to tolerate BiPAP.  Extubated again on 2/17 -at baseline on 3 L O2 via [place] outpatient  - continue Pulmicort, Brovana, Xopenex, Yupelri  -Was encouraged use incentive spirometry and flutter valve, to take on discharge and keep using  at home -Was treated with steroids, significant wheezing and dyspnea required prolonged course, will discharge on prednisone taper -She is back to her baseline on continuous oxygen -2D echo 2/14 showed EF of 65 to 70%, no regional WMA, G1 DD mild to moderate TR -CTA chest 2/13 negative for PE -BNP 20.7   Flu A infection -Completed Tamiflu x 5 days   Pneumonia - stenotrophomonas -Initially intubated on 2/13 on admission, extubated on 2/14 however had to be reintubated on 2/16 due to hypercarbia and unable to tolerate BiPAP.  Extubated again on 2/17 -Treated with Bactrim -Back on baseline 3 L oxygen, no further need on antibiotics on discharge     Anxiety, ?  ICU delirium -IV steroids were discontinued on 2/17 due to concern for steroid-induced psychosis resulting in anxiety and agitation -This has resolved, mentation back to baseline       Bicuspid AV Severe AS s/p MVR 05/2021 (bioprosthetic) -Previous MVR with Dr. Dorris Fetch 2022, on ASA 325 mg at home.  -2D echo showed EF of 65-70% with G1DD (new), no regional  wall motion abnormalities.  -Continue aspirin 325 mg  -Resume home meds   Tobacco use Pulmonary nodules -CTA Chest showed multiple bilateral pulmonary nodules measuring up to 9mm, increased from prior. - Outpatient follow up for lung cancer screening/nodule characterization - Encourage smoking  cessation -Patient needs to follow-up with her pulmonary team as an outpatient for further workup, discussed with the patient, she is aware of these findings and will schedule an appointment with her pulmonary physician.   Chronic thrombocytopenia -Stable, continue to monitor     Severe Protein Calorie malnutrition -Due to critical illness -Tube feeds stopped on 2/17 -Seen by SLP, currently on regular diet.      Pressure Injury Documentation: Sacrum with stage I, POA Foam dressing per shift, wound care          Discharge Diagnoses:  Principal Problem:   Hypercapnic respiratory failure (HCC) Active Problems:   Tobacco use disorder   COPD exacerbation (HCC)   Aortic stenosis   S/P aortic valve replacement   Essential hypertension   Acute hypoxemic respiratory failure (HCC)   Pressure injury of skin    Discharge Instructions  Discharge Instructions     Diet - low sodium heart healthy   Complete by: As directed    Discharge instructions   Complete by: As directed    Follow with Primary MD Benita Stabile, MD in 7 days   Get CBC, CMP, 2 view Chest X ray checked  by Primary MD next visit.    Activity: As tolerated with Full fall precautions use walker/cane & assistance as needed   Disposition Home    Diet: Heart Healthy  , with feeding assistance and aspiration precautions.    On your next visit with your primary care physician please Get Medicines reviewed and adjusted.   Please request your Prim.MD to go over all Hospital Tests and Procedure/Radiological results at the follow up, please get all Hospital records sent to your Prim MD by signing hospital release before you go home.   If you experience worsening of your admission symptoms, develop shortness of breath, life threatening emergency, suicidal or homicidal thoughts you must seek medical attention immediately by calling 911 or calling your MD immediately  if symptoms less severe.  You Must read  complete instructions/literature along with all the possible adverse reactions/side effects for all the Medicines you take and that have been prescribed to you. Take any new Medicines after you have completely understood and accpet all the possible adverse reactions/side effects.   Do not drive, operating heavy machinery, perform activities at heights, swimming or participation in water activities or provide baby sitting services if your were admitted for syncope or siezures until you have seen by Primary MD or a Neurologist and advised to do so again.  Do not drive when taking Pain medications.    Do not take more than prescribed Pain, Sleep and Anxiety Medications  Special Instructions: If you have smoked or chewed Tobacco  in the last 2 yrs please stop smoking, stop any regular Alcohol  and or any Recreational drug use.  Wear Seat belts while driving.   Please note  You were cared for by a hospitalist during your hospital stay. If you have any questions about your discharge medications or the care you received while you were in the hospital after you are discharged, you can call the unit and asked to speak with the hospitalist on call if the hospitalist that took care of you is not available. Once  you are discharged, your primary care physician will handle any further medical issues. Please note that NO REFILLS for any discharge medications will be authorized once you are discharged, as it is imperative that you return to your primary care physician (or establish a relationship with a primary care physician if you do not have one) for your aftercare needs so that they can reassess your need for medications and monitor your lab values.   Increase activity slowly   Complete by: As directed    No wound care   Complete by: As directed       Allergies as of 10/06/2023   No Known Allergies      Medication List     STOP taking these medications    metoprolol tartrate 25 MG  tablet Commonly known as: LOPRESSOR   naproxen sodium 220 MG tablet Commonly known as: ALEVE       TAKE these medications    Airsupra 90-80 MCG/ACT Aero Generic drug: Albuterol-Budesonide Inhale 1 puff into the lungs every 4 (four) hours as needed (wheezing, shortness of breath).   aspirin EC 325 MG tablet Take 1 tablet (325 mg total) by mouth daily.   furosemide 20 MG tablet Commonly known as: LASIX Take 20 mg by mouth daily.   ipratropium-albuterol 0.5-2.5 (3) MG/3ML Soln Commonly known as: DUONEB Take 3 mLs by nebulization every 4 (four) hours as needed (Shortness of breath/wheezing).   multivitamin with minerals Tabs tablet Take 1 tablet by mouth daily.   pantoprazole 40 MG tablet Commonly known as: PROTONIX Take 1 tablet (40 mg total) by mouth daily.   predniSONE 10 MG (21) Tbpk tablet Commonly known as: STERAPRED UNI-PAK 21 TAB Use per package instruction   sertraline 50 MG tablet Commonly known as: ZOLOFT Take 50 mg by mouth daily.   Trelegy Ellipta 200-62.5-25 MCG/ACT Aepb Generic drug: Fluticasone-Umeclidin-Vilant Inhale 1 puff into the lungs daily.        Follow-up Information     [facility] Follow up.   Specialty: Rehabilitation Why: Please call to schedule your OP therapy sessions. Contact information: [address] [place] [address] [phone]               No Known Allergies  Consultations: PCCM   Procedures/Studies: DG CHEST PORT 1 VIEW Result Date: 09/30/2023 CLINICAL DATA:  Dyspnea. EXAM: PORTABLE CHEST 1 VIEW COMPARISON:  September 27, 2023. FINDINGS: The heart size and mediastinal contours are within normal limits. Sternotomy wires are noted. Status post aortic valve repair. Right-sided PICC line is noted with distal tip in expected position of cavoatrial junction. Minimal bibasilar subsegmental atelectasis is noted. The visualized skeletal structures are  unremarkable. IMPRESSION: Right-sided PICC line as noted above. Minimal bibasilar subsegmental atelectasis. Electronically Signed   By: Lupita Raider M.D.   On: 09/30/2023 16:32   DG Abd Portable 1V Result Date: 09/27/2023 CLINICAL DATA:  Orogastric tube placement. EXAM: PORTABLE ABDOMEN - 1 VIEW COMPARISON:  09/24/2023 FINDINGS: Tip and side port of the enteric tube below the diaphragm in the stomach. Surgical clips in the right abdomen. No bowel dilatation in the upper abdomen. IMPRESSION: Tip and side port of the enteric tube below the diaphragm in the stomach. Electronically Signed   By: Narda Rutherford M.D.   On: 09/27/2023 14:44   [place] EKG SITE RITE Result Date: 09/27/2023 If Site Rite image not attached, placement could not be confirmed due to current cardiac rhythm.  Portable  Chest x-ray Result Date: 09/27/2023 CLINICAL DATA:  Shortness of breath and chest pain EXAM: PORTABLE CHEST 1 VIEW COMPARISON:  09/25/2023 FINDINGS: ET tube tip terminates above the carina by approximately 1.6 cm. Previous median sternotomy and aortic valve repair. Heart size is normal. No pleural effusion, interstitial edema or airspace disease. Coarsened interstitial markings are identified suggestive of COPD. No superimposed airspace consolidation. IMPRESSION: 1. Satisfactory position of ET tube. 2. No acute findings. 3. COPD. Electronically Signed   By: Signa Kell M.D.   On: 09/27/2023 11:07   ECHOCARDIOGRAM COMPLETE Result Date: 09/25/2023    ECHOCARDIOGRAM REPORT   Patient Name:   Crystal Silva Date of Exam: 09/25/2023 Medical Rec #:  [id number]     Height:       69.0 in Accession #:    [id number]    Weight:       157.2 lb Date of Birth:  [birth year]    BSA:          1.865 m Patient Age:    62 years      BP:           106/79 mmHg Patient Gender: F             HR:           108 bpm. Exam Location:  Inpatient Procedure: 2D Echo, Cardiac Doppler and Color Doppler (Both Spectral and Color            Flow Doppler were  utilized during procedure). Indications:    S/P Aortic Valve replacement Z95.2  History:        Patient has prior history of Echocardiogram examinations, most                 recent 06/19/2023. COPD, Aortic Valve Disease,                 Signs/Symptoms:Chest Pain and Shortness of Breath; Risk                 Factors:Hypertension.                 Aortic Valve: 23 mm Inspiris Resilia valve is present in the                 aortic position.  Sonographer:    Webb Laws Referring Phys: [id number] STEPHANIE M REESE  Sonographer Comments: Technically difficult study due to poor echo windows. IMPRESSIONS  1. Mild intracavitary gradient. Peak velocity 1.46 m/s. Peak gradient 8.5 mmHg. Left ventricular ejection fraction, by estimation, is 65 to 70%. The left ventricle has normal function. The left ventricle has no regional wall motion abnormalities. There is mild left ventricular hypertrophy of the septal segment. Left ventricular diastolic parameters are consistent with Grade I diastolic dysfunction (impaired relaxation).  2. Right ventricular systolic function is normal. The right ventricular size is normal. There is moderately elevated pulmonary artery systolic pressure.  3. The mitral valve is normal in structure. No evidence of mitral valve regurgitation. No evidence of mitral stenosis.  4. Tricuspid valve regurgitation is mild to moderate.  5. The aortic valve has been repaired/replaced. Aortic valve regurgitation is not visualized. No aortic stenosis is present. There is a 23 mm Inspiris Resilia valve present in the aortic position.  6. The inferior vena cava is dilated in size with <50% respiratory variability, suggesting right atrial pressure of 15 mmHg. FINDINGS  Left Ventricle: Mild intracavitary gradient. Peak velocity 1.46 m/s. Peak gradient 8.5 mmHg. Left ventricular  ejection fraction, by estimation, is 65 to 70%. The left ventricle has normal function. The left ventricle has no regional wall motion  abnormalities. Strain imaging was not performed. The left ventricular internal cavity size was normal in size. There is mild left ventricular hypertrophy of the septal segment. Left ventricular diastolic parameters are consistent with Grade I diastolic dysfunction (impaired relaxation). Right Ventricle: The right ventricular size is normal. No increase in right ventricular wall thickness. Right ventricular systolic function is normal. There is moderately elevated pulmonary artery systolic pressure. The tricuspid regurgitant velocity is 2.78 m/s, and with an assumed right atrial pressure of 15 mmHg, the estimated right ventricular systolic pressure is 45.9 mmHg. Left Atrium: Left atrial size was normal in size. Right Atrium: Right atrial size was normal in size. Pericardium: There is no evidence of pericardial effusion. Mitral Valve: The mitral valve is normal in structure. No evidence of mitral valve regurgitation. No evidence of mitral valve stenosis. Tricuspid Valve: The tricuspid valve is normal in structure. Tricuspid valve regurgitation is mild to moderate. No evidence of tricuspid stenosis. Aortic Valve: The aortic valve has been repaired/replaced. Aortic valve regurgitation is not visualized. No aortic stenosis is present. Aortic valve mean gradient measures 7.0 mmHg. Aortic valve peak gradient measures 12.3 mmHg. Aortic valve area, by VTI  measures 1.30 cm. There is a 23 mm Inspiris Resilia valve present in the aortic position. Pulmonic Valve: The pulmonic valve was normal in structure. Pulmonic valve regurgitation is not visualized. No evidence of pulmonic stenosis. Aorta: The aortic root is normal in size and structure. Venous: The inferior vena cava is dilated in size with less than 50% respiratory variability, suggesting right atrial pressure of 15 mmHg. IAS/Shunts: No atrial level shunt detected by color flow Doppler. Additional Comments: 3D imaging was not performed.  LEFT VENTRICLE PLAX 2D LVIDd:          3.60 cm   Diastology LVIDs:         2.70 cm   LV e' medial:    6.20 cm/s LV PW:         0.90 cm   LV E/e' medial:  9.0 LV IVS:        1.10 cm   LV e' lateral:   3.98 cm/s LVOT diam:     1.70 cm   LV E/e' lateral: 14.1 LV SV:         36 LV SV Index:   19 LVOT Area:     2.27 cm  RIGHT VENTRICLE            IVC RV Basal diam:  3.60 cm    IVC diam: 2.50 cm RV S prime:     7.58 cm/s TAPSE (M-mode): 1.6 cm LEFT ATRIUM           Index        RIGHT ATRIUM          Index LA diam:      1.90 cm 1.02 cm/m   RA Area:     6.55 cm LA Vol (A2C): 23.4 ml 12.55 ml/m  RA Volume:   11.10 ml 5.95 ml/m LA Vol (A4C): 13.2 ml 7.08 ml/m  AORTIC VALVE AV Area (Vmax):    1.62 cm AV Area (Vmean):   1.29 cm AV Area (VTI):     1.30 cm AV Vmax:           175.50 cm/s AV Vmean:          125.000 cm/s AV  VTI:            0.275 m AV Peak Grad:      12.3 mmHg AV Mean Grad:      7.0 mmHg LVOT Vmax:         125.00 cm/s LVOT Vmean:        71.200 cm/s LVOT VTI:          0.157 m LVOT/AV VTI ratio: 0.57 MITRAL VALVE               TRICUSPID VALVE MV Area (PHT): 6.35 cm    TR Peak grad:   30.9 mmHg MV Decel Time: 120 msec    TR Vmax:        278.00 cm/s MV E velocity: 56.10 cm/s MV A velocity: 77.95 cm/s  SHUNTS MV E/A ratio:  0.72        Systemic VTI:  0.16 m                            Systemic Diam: 1.70 cm Chilton Si MD Electronically signed by Chilton Si MD Signature Date/Time: 09/25/2023/11:49:06 AM    Final    DG Chest Port 1 View Result Date: 09/25/2023 CLINICAL DATA:  Intubation.  Flu a EXAM: PORTABLE CHEST 1 VIEW COMPARISON:  Yesterday FINDINGS: Endotracheal tube with tip between the clavicular heads and carina. An enteric tube at least reaches the stomach. Artifact from EKG leads. Hyperinflation and pulmonary lucency from COPD. There is no edema, consolidation, effusion, or pneumothorax. IMPRESSION: Unremarkable hardware positioning. Stable aeration with generalized findings of COPD. Electronically Signed   By: Tiburcio Pea M.D.   On: 09/25/2023 07:45   DG Abd Portable 1 View Result Date: 09/24/2023 CLINICAL DATA:  Orogastric tube placement EXAM: PORTABLE ABDOMEN - 1 VIEW COMPARISON:  None Available. FINDINGS: Nasogastric tube tip overlies the proximal body of the stomach. The abdominal gas pattern is indeterminate due to a paucity of intra-abdominal gas. Right flank and pelvis excluded from view. IMPRESSION: 1. Nasogastric tube tip within the proximal body of the stomach. Electronically Signed   By: Helyn Numbers M.D.   On: 09/24/2023 23:02   CT CHEST WO CONTRAST Result Date: 09/24/2023 CLINICAL DATA:  Lung nodule, > 8mm EXAM: CT CHEST WITHOUT CONTRAST TECHNIQUE: Multidetector CT imaging of the chest was performed following the standard protocol without IV contrast. RADIATION DOSE REDUCTION: This exam was performed according to the departmental dose-optimization program which includes automated exposure control, adjustment of the mA and/or kV according to patient size and/or use of iterative reconstruction technique. COMPARISON:  06/24/2022 FINDINGS: Cardiovascular: Status post aortic valve replacement. Global cardiac size is within normal limits. No significant coronary artery calcification. No pericardial effusion. Central pulmonary arteries are of caliber. Moderate atherosclerotic calcification within the thoracic aorta. Mediastinum/Nodes: No enlarged mediastinal or axillary lymph nodes. Thyroid gland, trachea, and esophagus demonstrate no significant findings. Lungs/Pleura: Moderate emphysema. Previously noted 8 mm nodule within the right upper lobe, axial image # 65/5, is stable and safely considered benign given its stability over time. However, an additional 7 mm noncalcified pulmonary nodule has developed within the right upper lobe, axial image # 60/indeterminate. Additionally, a microlobulated 8 mm nodule has developed within the left upper lobe, axial image # 28/5, indeterminate. Stable subpleural scarring  within the posterior right apex. Nodular infiltrate within the lingula, axial image # 98/5, may be infectious or inflammatory in nature, but is indeterminate. No pneumothorax or pleural effusion. Bronchial  wall thickening present in keeping with airway inflammation. No central obstructing lesion. Upper Abdomen: Surgical changes of probable right hepatectomy are identified. No acute abnormality Musculoskeletal: The osseous structures are diffusely osteopenic. Multiple remote compression deformities are seen within mid and lower thoracic spine. No definite acute bone abnormality identified. IMPRESSION: 1. Interval development of a 7 mm noncalcified pulmonary nodule within the right upper lobe and 8 mm microlobulated nodule within the left upper lobe, indeterminate. Non-contrast chest CT at 6-12 months is recommended. If the nodule is stable at time of repeat CT, then future CT at 18-24 months (from today's scan) is considered optional for low-risk patients, but is recommended for high-risk patients. This recommendation follows the consensus statement: Guidelines for Management of Incidental Pulmonary Nodules Detected on CT Images: From the Fleischner Society 2017; Radiology 2017; 284:228-243. 2. Nodular infiltrate within the lingula, may be infectious or inflammatory in nature, but is indeterminate. This can be reassessed at follow-up imaging. 3. Bronchial wall thickening in keeping with airway inflammation. No central obstructing lesion. 4. Moderate emphysema 5. Osteopenia. Multiple remote compression deformities within the mid and lower thoracic spine. Aortic Atherosclerosis (ICD10-I70.0) and Emphysema (ICD10-J43.9). Electronically Signed   By: Helyn Numbers M.D.   On: 09/24/2023 23:01   DG Chest Portable 1 View Result Date: 09/24/2023 CLINICAL DATA:  Respiratory failure EXAM: PORTABLE CHEST 1 VIEW COMPARISON:  08/11/2023 FINDINGS: Endotracheal tube is seen 16 mm above the carina. Nasogastric tube extends into  the upper abdomen beyond the margin of the examination. The lungs are hyper there is coarsening of pulmonary interstitium in keeping with changes of underlying emphysema. No confluent pulmonary infiltrate. No pneumothorax or pleural effusion. Aortic replacement has been performed. Cardiac size within normal limits. No acute bone abnormality. IMPRESSION: 1. Support tubes in appropriate position. 2. Emphysema. Electronically Signed   By: Helyn Numbers M.D.   On: 09/24/2023 22:54   CT Angio Chest PE W and/or Wo Contrast Result Date: 09/24/2023 CLINICAL DATA:  Pulmonary embolism suspected, low to intermediate probability, positive D-dimer. Shortness of breath and chest pain for 2 days. EXAM: CT ANGIOGRAPHY CHEST WITH CONTRAST TECHNIQUE: Multidetector CT imaging of the chest was performed using the standard protocol during bolus administration of intravenous contrast. Multiplanar CT image reconstructions and MIPs were obtained to evaluate the vascular anatomy. RADIATION DOSE REDUCTION: This exam was performed according to the departmental dose-optimization program which includes automated exposure control, adjustment of the mA and/or kV according to patient size and/or use of iterative reconstruction technique. CONTRAST:  75mL OMNIPAQUE IOHEXOL 350 MG/ML SOLN COMPARISON:  06/24/2022, 09/18/2023. FINDINGS: Cardiovascular: The heart is normal in size and there is no pericardial effusion. A prosthetic aortic valve is noted. There is atherosclerotic calcification of the aorta without evidence of aneurysm. The pulmonary trunk is normal in caliber. No definite evidence of pulmonary embolism. Evaluation is limited due to respiratory motion artifact. Mediastinum/Nodes: Prominent nonspecific lymph nodes are noted in the mediastinum measuring up to 1 cm. No hilar or axillary lymphadenopathy is seen. The thyroid gland, trachea, and esophagus are within normal limits. Lungs/Pleura: Advanced emphysematous changes are present in  the lungs. Pleural and parenchymal scarring is present at the lung apices, greater on the right than on the left. Airspace opacities are noted in the right middle lobe with scattered tree-in-bud nodules, new from the previous exam and likely infectious or inflammatory. Stable atelectasis or scarring is noted in the lingular segment of the left upper lobe. No effusion or pneumothorax is seen. There is a  stable 9 mm left upper lobe pulmonary nodule, axial image 75. A new 8 mm nodule is present in the left upper lobe, axial image 35. A subsolid nodule is present in the lingular segment of the left upper lobe measuring 7 mm, axial image 107. A 6 mm nodule is noted in the left upper lobe, axial image 66. Upper Abdomen: Interventional changes are noted in the right lobe of the liver. No acute abnormality is seen. Musculoskeletal: Sternotomy wires are present. Kyphosis with multilevel compression deformities in the thoracic spine is unchanged. Degenerative changes are noted in the thoracic spine. Review of the MIP images confirms the above findings. IMPRESSION: 1. No evidence of pulmonary embolism. 2. Mild airspace disease with tree-in-bud nodular opacities in the right middle lobe, likely infectious or inflammatory. 3. Scattered pulmonary nodules bilaterally measuring up to 9 mm, many new from the previous exam. Consider one of the following in 3 months for both low-risk and high-risk individuals: (a) repeat chest CT, (b) follow-up PET-CT, or (c) tissue sampling. This recommendation follows the consensus statement: Guidelines for Management of Incidental Pulmonary Nodules Detected on CT Images: From the Fleischner Society 2017; Radiology 2017; 284:228-243. 4. Emphysema. 5. Aortic atherosclerosis. Electronically Signed   By: Thornell Sartorius M.D.   On: 09/24/2023 19:58     Subjective:  No significant events overnight she denies any new complaints, and cough much improved, dyspnea at baseline  Discharge Exam: Vitals:    10/06/23 0544 10/06/23 0854  BP: 110/61   Pulse: 82   Resp: 17 16  Temp: 97.9 F (36.6 C)   SpO2: 92%    Vitals:   10/05/23 2330 10/06/23 0500 10/06/23 0544 10/06/23 0854  BP: 117/69  110/61   Pulse: 85  82   Resp: 18  17 16   Temp: 97.9 F (36.6 C)  97.9 F (36.6 C)   TempSrc: Oral  Oral   SpO2: 93%  92%   Weight:  73.7 kg    Height:        General: Pt is alert, awake, not in acute distress, frail Cardiovascular: RRR, S1/S2 +, no rubs, no gallops Respiratory: Good air entry bilaterally, scattered Rales which is at baseline Abdominal: Soft, NT, ND, bowel sounds + Extremities: no edema, no cyanosis    The results of significant diagnostics from this hospitalization (including imaging, microbiology, ancillary and laboratory) are listed below for reference.     Microbiology: No results found for this or any previous visit (from the past 240 hours).   Labs: BNP (last 3 results) Recent Labs    09/24/23 1813 10/01/23 0800  BNP 50.0 20.7   Basic Metabolic Panel: Recent Labs  Lab 09/30/23 0400 10/02/23 0322 10/03/23 0415 10/04/23 0244  NA 139 137 138 136  K 4.4 4.5 4.4 4.2  CL 96* 98 98 98  CO2 33* 28 29 28   GLUCOSE 102* 99 101* 116*  BUN 19 12 14 17   CREATININE 0.63 0.59 0.61 0.66  CALCIUM 8.8* 9.0 9.4 9.0  MG 2.2  --   --  1.8  PHOS 3.2  --   --  4.0   Liver Function Tests: No results for input(s): "AST", "ALT", "ALKPHOS", "BILITOT", "PROT", "ALBUMIN" in the last 168 hours. No results for input(s): "LIPASE", "AMYLASE" in the last 168 hours. No results for input(s): "AMMONIA" in the last 168 hours. CBC: Recent Labs  Lab 09/30/23 0400 10/02/23 0322 10/03/23 0415 10/04/23 0244  WBC 12.3* 9.5 8.2 9.3  HGB 12.5 12.6 12.0 12.0  HCT 38.7 38.8 36.7 36.5  MCV 98.7 98.0 97.3 97.9  PLT 113* 155 177 183   Cardiac Enzymes: No results for input(s): "CKTOTAL", "CKMB", "CKMBINDEX", "TROPONINI" in the last 168 hours. BNP: Invalid input(s):  "POCBNP" CBG: Recent Labs  Lab 09/30/23 2016 09/30/23 2345 10/01/23 0414 10/05/23 0847 10/05/23 1235  GLUCAP 102* 97 95 103* 125*   D-Dimer No results for input(s): "DDIMER" in the last 72 hours. Hgb A1c No results for input(s): "HGBA1C" in the last 72 hours. Lipid Profile No results for input(s): "CHOL", "HDL", "LDLCALC", "TRIG", "CHOLHDL", "LDLDIRECT" in the last 72 hours. Thyroid function studies No results for input(s): "TSH", "T4TOTAL", "T3FREE", "THYROIDAB" in the last 72 hours.  Invalid input(s): "FREET3" Anemia work up No results for input(s): "VITAMINB12", "FOLATE", "FERRITIN", "TIBC", "IRON", "RETICCTPCT" in the last 72 hours. Urinalysis    Component Value Date/Time   COLORURINE YELLOW 08/12/2023 0145   APPEARANCEUR HAZY (A) 08/12/2023 0145   LABSPEC 1.025 08/12/2023 0145   PHURINE 5.0 08/12/2023 0145   GLUCOSEU NEGATIVE 08/12/2023 0145   HGBUR SMALL (A) 08/12/2023 0145   BILIRUBINUR NEGATIVE 08/12/2023 0145   KETONESUR NEGATIVE 08/12/2023 0145   PROTEINUR 30 (A) 08/12/2023 0145   UROBILINOGEN 0.2 05/02/2009 1500   NITRITE NEGATIVE 08/12/2023 0145   LEUKOCYTESUR SMALL (A) 08/12/2023 0145   Sepsis Labs Recent Labs  Lab 09/30/23 0400 10/02/23 0322 10/03/23 0415 10/04/23 0244  WBC 12.3* 9.5 8.2 9.3   Microbiology No results found for this or any previous visit (from the past 240 hours).   Time coordinating discharge: Over 30 minutes  SIGNED:   Huey Bienenstock, MD  [facility] 10/06/2023, 10:33 AM Pager   If 7PM-7AM, please contact night-coverage [email or web address]

## 2023-10-06 NOTE — Discharge Instructions (Signed)
 Follow with Primary MD Benita Stabile, MD in 7 days   Get CBC, CMP, 2 view Chest X ray checked  by Primary MD next visit.    Activity: As tolerated with Full fall precautions use walker/cane & assistance as needed   Disposition Home    Diet: Heart Healthy  , with feeding assistance and aspiration precautions.    On your next visit with your primary care physician please Get Medicines reviewed and adjusted.   Please request your Prim.MD to go over all Hospital Tests and Procedure/Radiological results at the follow up, please get all Hospital records sent to your Prim MD by signing hospital release before you go home.   If you experience worsening of your admission symptoms, develop shortness of breath, life threatening emergency, suicidal or homicidal thoughts you must seek medical attention immediately by calling 911 or calling your MD immediately  if symptoms less severe.  You Must read complete instructions/literature along with all the possible adverse reactions/side effects for all the Medicines you take and that have been prescribed to you. Take any new Medicines after you have completely understood and accpet all the possible adverse reactions/side effects.   Do not drive, operating heavy machinery, perform activities at heights, swimming or participation in water activities or provide baby sitting services if your were admitted for syncope or siezures until you have seen by Primary MD or a Neurologist and advised to do so again.  Do not drive when taking Pain medications.    Do not take more than prescribed Pain, Sleep and Anxiety Medications  Special Instructions: If you have smoked or chewed Tobacco  in the last 2 yrs please stop smoking, stop any regular Alcohol  and or any Recreational drug use.  Wear Seat belts while driving.   Please note  You were cared for by a hospitalist during your hospital stay. If you have any questions about your discharge medications or the  care you received while you were in the hospital after you are discharged, you can call the unit and asked to speak with the hospitalist on call if the hospitalist that took care of you is not available. Once you are discharged, your primary care physician will handle any further medical issues. Please note that NO REFILLS for any discharge medications will be authorized once you are discharged, as it is imperative that you return to your primary care physician (or establish a relationship with a primary care physician if you do not have one) for your aftercare needs so that they can reassess your need for medications and monitor your lab values.

## 2023-10-06 NOTE — Progress Notes (Signed)
 Occupational Therapy Treatment Patient Details Name: Crystal Silva MRN: [id number] DOB: 1961-04-12 Today's Date: 10/06/2023   History of present illness 63 y.o. female presents to [facility] hospital on 2/14 as a transfer from [facility] due to respiratory failure. Pt intubated in [facility] ED, extubated on 2/14 but requiring re-intubation on 2/16. Extubated 2/17. PMH includes bicuspid AV with severe AS, COPD, RUL pulmonary nodule.   OT comments  Pt making excellent progress towards OT goals with session focusing on bathing/dressing tasks seated and standing at sink with overall Setup Assist provided. Emphasis on energy conservation w/ pt self monitoring need for seated rest breaks. Handouts also provided for energy conservation and UE HEP to maximize endurance at home. Pt has progressed well enough to DC home once medically cleared.       If plan is discharge home, recommend the following:  Assistance with cooking/housework;Direct supervision/assist for medications management;Direct supervision/assist for financial management;Assist for transportation;Help with stairs or ramp for entrance;A little help with bathing/dressing/bathroom   Equipment Recommendations  BSC/3in1    Recommendations for Other Services      Precautions / Restrictions Precautions Precautions: Fall;Other (comment) Recall of Precautions/Restrictions: Intact Precaution/Restrictions Comments: monitor O2 Restrictions Weight Bearing Restrictions Per Provider Order: No       Mobility Bed Mobility               General bed mobility comments: EOB on entry    Transfers Overall transfer level: Needs assistance Equipment used: None Transfers: Sit to/from Stand Sit to Stand: Supervision                 Balance Overall balance assessment: Needs assistance Sitting-balance support: Feet supported, No upper extremity supported Sitting balance-Leahy Scale: Good     Standing balance support: Bilateral upper  extremity supported, Reliant on assistive device for balance, No upper extremity supported Standing balance-Leahy Scale: Fair Standing balance comment: able to stand at sink without UE support. For mobility, UE support beneficial due to tendency to furniture walk without RW                           ADL either performed or assessed with clinical judgement   ADL Overall ADL's : Needs assistance/impaired     Grooming: Set up;Standing;Wash/dry face   Upper Body Bathing: Set up;Sitting   Lower Body Bathing: Set up;Sitting/lateral leans Lower Body Bathing Details (indicate cue type and reason): able to bathe peri region standing at sink. intermittent one UE support on sink Upper Body Dressing : Set up;Sitting   Lower Body Dressing: Set up;Sitting/lateral leans;Sit to/from stand Lower Body Dressing Details (indicate cue type and reason): to don mesh underwear             Functional mobility during ADLs: Contact guard assist;Supervision/safety General ADL Comments: Emphasis on bathing seated and standing at sink, energy conservation with handout provided as well as UE HEP w/ level 2 theraband as pt hoping to work on endurance at home    Extremity/Trunk Assessment Upper Extremity Assessment Upper Extremity Assessment: Generalized weakness;Right hand dominant   Lower Extremity Assessment Lower Extremity Assessment: Defer to PT evaluation        Vision   Vision Assessment?: No apparent visual deficits   Perception     Praxis     Communication Communication Communication: No apparent difficulties   Cognition Arousal: Alert Behavior During Therapy: WFL for tasks assessed/performed Cognition: No family/caregiver present to determine baseline  OT - Cognition Comments: cognition much improved, appears near baseline though not formally assessed                 Following commands: Intact        Cueing   Cueing Techniques: Verbal cues,  Tactile cues  Exercises      Shoulder Instructions       General Comments VSS on 3 L o2. Pt inquiring about smaller supplemental O2 tank to use in community due to need to use RW for mobility as well - no CM or SW assigned to pt at time of eval to secure chat- briefly discussed with RN    Pertinent Vitals/ Pain       Pain Assessment Pain Assessment: No/denies pain  Home Living                                          Prior Functioning/Environment              Frequency  Min 1X/week        Progress Toward Goals  OT Goals(current goals can now be found in the care plan section)  Progress towards OT goals: Progressing toward goals  Acute Rehab OT Goals Patient Stated Goal: home today OT Goal Formulation: With patient Time For Goal Achievement: 10/13/23 Potential to Achieve Goals: Good ADL Goals Pt Will Perform Upper Body Dressing: with modified independence;sitting Pt Will Perform Lower Body Dressing: with modified independence;sit to/from stand;sitting/lateral leans Pt Will Transfer to Toilet: with modified independence;ambulating;regular height toilet Additional ADL Goal #1: pt will follow 2 step command in prep for ADLs  Plan      Co-evaluation                 AM-PAC OT "6 Clicks" Daily Activity     Outcome Measure   Help from another person eating meals?: None Help from another person taking care of personal grooming?: A Little Help from another person toileting, which includes using toliet, bedpan, or urinal?: A Little Help from another person bathing (including washing, rinsing, drying)?: A Little Help from another person to put on and taking off regular upper body clothing?: A Little Help from another person to put on and taking off regular lower body clothing?: A Little 6 Click Score: 19    End of Session Equipment Utilized During Treatment: Oxygen  OT Visit Diagnosis: Unsteadiness on feet (R26.81);Other abnormalities of  gait and mobility (R26.89);Muscle weakness (generalized) (M62.81);Other symptoms and signs involving cognitive function   Activity Tolerance Patient tolerated treatment well   Patient Left in bed;with call bell/phone within reach   Nurse Communication Mobility status        Time: 0726-0756 OT Time Calculation (min): 30 min  Charges: OT General Charges $OT Visit: 1 Visit OT Treatments $Self Care/Home Management : 23-37 mins  Bradd Canary, OTR/L Acute Rehab Services Office: [phone]   Lorre Munroe 10/06/2023, 8:30 AM

## 2023-10-06 NOTE — TOC Transition Note (Signed)
 Transition of Care [facility]) - Discharge Note   Patient Details  Name: Crystal Silva MRN: [id number] Date of Birth: [birth year]  [facility] CM/SW Contact:  Gordy Clement, RN Phone Number: 10/06/2023, 8:54 AM   Clinical Narrative:    Patient to DC to home today . Outpatient therapy has been referred AVS updated Family will transport        Barriers to Discharge: Continued Medical Work up, English as a second language teacher, SNF Pending bed offer   Patient Goals and CMS Choice Patient states their goals for this hospitalization and ongoing recovery are:: Rehab CMS Medicare.gov Compare Post Acute Care list provided to:: Patient Choice offered to / list presented to : Patient [place] ownership interest in [facility].provided to:: Patient    Discharge Placement                       Discharge Plan and Services Additional resources added to the After Visit Summary for   In-house Referral: Clinical Social Work Discharge Planning Services: CM Consult Post Acute Care Choice: Skilled Nursing Facility          DME Arranged: N/A DME Agency: [employer]                  Social Drivers of Health (SDOH) Interventions SDOH Screenings   Food Insecurity: Patient Unable To Answer (09/25/2023)  Housing: Patient Unable To Answer (09/25/2023)  Transportation Needs: Patient Unable To Answer (09/25/2023)  Utilities: Patient Unable To Answer (09/25/2023)  Social Connections: Socially Isolated (08/14/2023)  Tobacco Use: High Risk (09/24/2023)     Readmission Risk Interventions     No data to display

## 2023-10-06 NOTE — Plan of Care (Signed)
  Problem: Clinical Measurements: Goal: Ability to maintain clinical measurements within normal limits will improve Outcome: Progressing Goal: Respiratory complications will improve Outcome: Progressing   Problem: Activity: Goal: Risk for activity intolerance will decrease Outcome: Progressing   

## 2023-10-07 ENCOUNTER — Telehealth: Payer: Self-pay

## 2023-10-07 NOTE — Transitions of Care (Post Inpatient/ED Visit) (Signed)
 10/07/2023  Name: Crystal Silva MRN: [id number] DOB: 01-Jan-1961  Today's TOC FU Call Status: Today's TOC FU Call Status:: Successful TOC FU Call Completed TOC FU Call Complete Date: 10/07/23 Patient's Name and Date of Birth confirmed.  Transition Care Management Follow-up Telephone Call Date of Discharge: 10/06/23 Discharge Facility: [facility]) Type of Discharge: Inpatient Admission Primary Inpatient Discharge Diagnosis:: Hypercapnic respiratory failure How have you been since you were released from the hospital?: Better Any questions or concerns?: No  Items Reviewed: Did you receive and understand the discharge instructions provided?: Yes Medications obtained,verified, and reconciled?: Yes (Medications Reviewed) Any new allergies since your discharge?: No Dietary orders reviewed?: Yes Type of Diet Ordered:: Reg Heart Healthy aspiration precautions Do you have support at home?: Yes People in Home: alone Name of Support/Comfort Primary Source: Daughter - Gearldine Bienenstock, Gr son (59 yo) lives with her  Hale Bogus  Medications Reviewed Today: Medications Reviewed Today     Reviewed by Johnnette Barrios, RN (Registered Nurse) on 10/07/23 at 1534  Med List Status: <None>   Medication Order Taking? Sig Documenting Provider Last Dose Status Informant  Albuterol-Budesonide (AIRSUPRA) 90-80 MCG/ACT AERO [id number] Yes Inhale 1 puff into the lungs every 4 (four) hours as needed (wheezing, shortness of breath). [provider] Taking Active Self           Med Note Jory Ee, Garnet Koyanagi   Fri Sep 25, 2023  3:11 PM) LF: 09/03/23 for a 30ds  aspirin EC 325 MG EC tablet [id number] Yes Take 1 tablet (325 mg total) by mouth daily. Rowe Clack, PA-C Taking Active Self  furosemide (LASIX) 20 MG tablet [id number] Yes Take 20 mg by mouth daily. [provider] Taking Active            Med Note Jory Ee, Garnet Koyanagi   Fri Sep 25, 2023  3:50 PM) LF: 04/17/23 for a 90ds   ipratropium-albuterol (DUONEB) 0.5-2.5 (3) MG/3ML SOLN [id number] Yes Take 3 mLs by nebulization every 4 (four) hours as needed (Shortness of breath/wheezing). Glade British Moyd, MD Taking Active Self           Med Note Jory Ee, Garnet Koyanagi   Fri Sep 25, 2023  3:11 PM) LF: 08/17/23 for a 30ds  Multiple Vitamin (MULTIVITAMIN WITH MINERALS) TABS tablet [id number] Yes Take 1 tablet by mouth daily. [provider] Taking Active Self  OXYGEN [id number] Yes Inhale 3 L/m2 into the lungs continuous. [provider] Taking Active   pantoprazole (PROTONIX) 40 MG tablet [id number] Yes Take 1 tablet (40 mg total) by mouth daily. Elgergawy, Leana Roe, MD Taking Active   predniSONE (STERAPRED UNI-PAK 21 TAB) 10 MG (21) TBPK tablet [id number] Yes Use per package instruction Elgergawy, Leana Roe, MD Taking Active   sertraline (ZOLOFT) 50 MG tablet [id number] Yes Take 50 mg by mouth daily. [provider] Taking Active Self           Med Note Jory Ee, Garnet Koyanagi   Fri Sep 25, 2023  3:13 PM) LF: 07/22/23 for a 90ds  TRELEGY ELLIPTA 200-62.5-25 MCG/ACT AEPB [id number] Yes Inhale 1 puff into the lungs daily. [provider] Taking Active Self           Med Note Jory Ee, Garnet Koyanagi   Fri Sep 25, 2023  3:12 PM) LF: 09/03/23 for a 30ds            Home Care and Equipment/Supplies: Were Home Health Services Ordered?: No (outpatent rehab) Any new equipment or medical  supplies ordered?: Yes Name of Medical supply agency?: [facility] Were you able to get the equipment/medical supplies?: Yes Do you have any questions related to the use of the equipment/supplies?: No  Functional Questionnaire: Do you need assistance with bathing/showering or dressing?: No Do you need assistance with meal preparation?: No Do you need assistance with eating?: No (watch aspirarion precautions) Do you have difficulty maintaining continence: No Do you need assistance with getting out of bed/getting out of a  chair/moving?: Yes (using [facility] and [facility]) Do you have difficulty managing or taking your medications?: No (better since pharmacy assist)  Follow up appointments reviewed: PCP Follow-up appointment confirmed?: No (she will all and schedule) MD Provider Line Number:[phone] Given: No Specialist Hospital Follow-up appointment confirmed?: NA Do you need transportation to your follow-up appointment?: No (She drives and family can assist)  SDOH Interventions Today    Flowsheet Row Most Recent Value  SDOH Interventions   Food Insecurity Interventions Intervention Not Indicated  Housing Interventions Intervention Not Indicated  Transportation Interventions Patient Resources (Friends/Family), Intervention Not Indicated  Utilities Interventions Intervention Not Indicated       Goals Addressed             This Visit's Progress    TOC Care Plan       Current Barriers:  Chronic Disease Management support and education needs related to COPD   RNCM Clinical Goal(s):  Patient will take all medications exactly as prescribed and will call provider for medication related questions as evidenced by no missed medication doses  attend all scheduled medical appointments: with PCP (08/21/23) and Pulmonologist  ( 08/25/23)- completed no changes  as evidenced by no missed appointment  continue to work with RN Care Manager to address care management and care coordination needs related to  COPD as evidenced by adherence to CM Team Scheduled appointments through collaboration with RN Care manager, provider, and care team.   Interventions: Evaluation of current treatment plan related to  self management and patient's adherence to plan as established by provider  Transitions of Care:  New goal. Doctor Visits  - discussed the importance of doctor visits Troubleshoot use of DME in the home  Post discharge activity limitations prescribed by provider reviewed Reviewed Signs and symptoms of infection  COPD  Interventions:  (Status:  New goal.) Short Term Goal Advised patient to track and manage COPD triggers Provided instruction about proper use of medications used for management of COPD including inhalers Advised patient to self assesses COPD action plan zone and make appointment with provider if in the yellow zone for 48 hours without improvement Advised patient to engage in light exercise as tolerated 3-5 days a week to aid in the the management of COPD Provided education about and advised patient to utilize infection prevention strategies to reduce risk of respiratory infection Discussed the importance of adequate rest and management of fatigue with COPD Discussed Pulmonary Rehab and offered to assist with referral placement Assessed social determinant of health barriers  Patient Goals/Self-Care Activities: Participate in Transition of Care Program/Attend TOC scheduled calls Take all medications as prescribed Attend all scheduled provider appointments Call pharmacy for medication refills 3-7 days in advance of running out of medications Perform all self care activities independently  Perform IADL's (shopping, preparing meals, housekeeping, managing finances) independently Call provider office for new concerns or questions  identify and avoid work-related triggers identify and remove indoor air pollutants limit outdoor activity during cold weather do breathing exercises every day attend pulmonary rehabilitation  eliminate symptom triggers at home keep follow-up appointments: with PCP and Pulmonology use an extra pillow to sleep eat healthy/prescribed diet: Heart Healthy, Low Fat Carb Modified  use devices that will help like a cane, sock-puller or reacher practice relaxation or meditation daily do breathing exercises every day do exercises in a comfortable position that makes breathing as easy as possible  Follow Up Plan:   Next visit 10/15/23 @ 12:00pm Patient  verbalizes understanding  of instructions and care plan provided. Patient was encouraged to make informed decisions about their care, actively participate in managing their health condition, and implement lifestyle changes as needed to promote independence and self-management of health care The patient has been provided with contact information for the care management team and has been advised to call with any health-related questions or concerns.       Patient is at high risk for readmission and/or has history of  high utilization  Discussed VBCI  TOC program and weekly calls to patient to assess condition/status, medication management  and provide support/education as indicated . Patient/ Caregiver voiced understanding and is  agreeable to 30 day program   She will follow-up with [employer] for portable concentrator, she is using Facilities manager.  She is in process of completing forms for Disability  She has a foam dressing on a sacral area for St 1 PI sustained in hospital  Routine follow-up and on-going assessment evaluation and education of disease processes, recommended interventions for both chronic and acute medical conditions , will occur during each weekly visit along with ongoing review of symptoms ,medication reviews and reconciliation. Any updates , inconsistencies, discrepancies or acute care concerns will be addressed and routed to the correct Practitioner if indicated   Based on current information and Insurance plan -Reviewed benefits available to patient, including details about eligibility options for care if any area of needs were identified.  Reviewed patients ability to access and / or navigating the benefits system..Amb Referral made if indicted , refer to orders section of note for details   Please refer to Care Plan for goals and interventions -Effectiveness of interventions, symptom management and outcomes will be evaluated  weekly during [facility] 30-day Program Outreach calls  . Any necessary  changes and  updates to Care Plan will be completed episodically    Reviewed goals for care Patient verbalizes understanding of instructions and care plan provided. Patient was encouraged to make informed decisions about their care, actively participate in managing their health condition, and implement lifestyle changes as needed to promote independence and self-management of health care   Patient was encouraged to Contact PCP with any changes in baseline or  medication regimen,  changes in health status  /  well-being, safety concerns, including falls any questions or concerns regarding ongoing medical care, any difficulty obtaining or picking up prescriptions, any changes or worsening in condition- including  symptoms not relieved  with interventions    The patient has been provided with contact information for the care management team and has been advised to call with any health-related questions or concerns. Follow up as indicated with Care Team , or sooner should any new problems arise.   Susa Loffler , BSN, RN [facility]   VBCI-Population Health RN Care Manager Direct Dial [phone]  Fax: [phone] Website: [email or web address]

## 2023-10-07 NOTE — Patient Instructions (Signed)
 Visit Information  Thank you for taking time to visit with me today. Please don't hesitate to contact me if I can be of assistance to you before our next scheduled telephone appointment.  Our next appointment is by telephone on 3/6 at 12:00pm   Following is a copy of your care plan:   Goals Addressed             This Visit's Progress    TOC Care Plan       Current Barriers:  Chronic Disease Management support and education needs related to COPD   RNCM Clinical Goal(s):  Patient will take all medications exactly as prescribed and will call provider for medication related questions as evidenced by no missed medication doses  attend all scheduled medical appointments: with PCP (08/21/23) and Pulmonologist  ( 08/25/23)- completed no changes  as evidenced by no missed appointment  continue to work with RN Care Manager to address care management and care coordination needs related to  COPD as evidenced by adherence to CM Team Scheduled appointments through collaboration with RN Care manager, provider, and care team.   Interventions: Evaluation of current treatment plan related to  self management and patient's adherence to plan as established by provider  Transitions of Care:  New goal. Doctor Visits  - discussed the importance of doctor visits Troubleshoot use of DME in the home  Post discharge activity limitations prescribed by provider reviewed Reviewed Signs and symptoms of infection  COPD Interventions:  (Status:  New goal.) Short Term Goal Advised patient to track and manage COPD triggers Provided instruction about proper use of medications used for management of COPD including inhalers Advised patient to self assesses COPD action plan zone and make appointment with provider if in the yellow zone for 48 hours without improvement Advised patient to engage in light exercise as tolerated 3-5 days a week to aid in the the management of COPD Provided education about and advised patient  to utilize infection prevention strategies to reduce risk of respiratory infection Discussed the importance of adequate rest and management of fatigue with COPD Discussed Pulmonary Rehab and offered to assist with referral placement Assessed social determinant of health barriers  Patient Goals/Self-Care Activities: Participate in Transition of Care Program/Attend TOC scheduled calls Take all medications as prescribed Attend all scheduled provider appointments Call pharmacy for medication refills 3-7 days in advance of running out of medications Perform all self care activities independently  Perform IADL's (shopping, preparing meals, housekeeping, managing finances) independently Call provider office for new concerns or questions  identify and avoid work-related triggers identify and remove indoor air pollutants limit outdoor activity during cold weather do breathing exercises every day attend pulmonary rehabilitation eliminate symptom triggers at home keep follow-up appointments: with PCP and Pulmonology use an extra pillow to sleep eat healthy/prescribed diet: Heart Healthy, Low Fat Carb Modified  use devices that will help like a cane, sock-puller or reacher practice relaxation or meditation daily do breathing exercises every day do exercises in a comfortable position that makes breathing as easy as possible  Follow Up Plan:   Next visit 10/15/23 @ 12:00pm Patient  verbalizes understanding of instructions and care plan provided. Patient was encouraged to make informed decisions about their care, actively participate in managing their health condition, and implement lifestyle changes as needed to promote independence and self-management of health care The patient has been provided with contact information for the care management team and has been advised to call with any health-related questions or  concerns.        Medication review  Reviewed current home medications -- provided  education as needed. Patient is aware of potential side effects and was encouraged to notify PCP for any adverse side effects or unwanted symptoms not relieved with interventions  Patient will call 911 for Medical Emergencies or Life -Threatening Symptoms.  Reviewed goals for care Patient/ Caregiver verbalizes understanding of instructions with the plan of care . The  Patient / Caregiver was encouraged to make informed decisions about care, actively participate in managing health conditions, and implement lifestyle changes as needed to promote independence and self-management of healthcare. SDOH screenings have been completed and addressed if indicted.  There are no reported barriers to care.    Follow-up Plan VBCI Case Management Nurse will provide follow-up and on-going assessment ,evaluation and education of disease processes, recommended interventions for both chronic and acute medical conditions ,  along with ongoing review of symptoms ,medication reviews / reconciliation during each weekly call . Any updates , inconsistencies, discrepancies or acute care concerns will be addressed and routed to the correct Practitioner if indicated   [facility]  Please call the care guide team at [phone]  if you need to cancel or reschedule your appointment . For scheduled calls -Three attempts will be made to reach you -if the scheduled call is missed or  we are unable to reach the you after 3 attempts no additional outreach attempts will be made and the TOC follow-up will be closed .   If you need to speak to a Nurse you may  call me directly at the number below or if I am unavailable,and  your need is urgent  please call the main VBCI number at [phone] and ask to speak with one of the [facility] ( Transition of Care )  Nurses  .  Patient was encouraged to Contact PCP with any changes in baseline or  medication regimen,  changes in health status  /  well-being, safety concerns, including  falls any questions or concerns regarding ongoing medical care, any difficulty obtaining or picking up prescriptions, any changes or worsening in condition- including  symptoms not relieved  with interventions                                                                            Additionally, If you experience worsening of your symptoms, develop shortness of breath, If you are experiencing a medical emergency,  develop suicidal or homicidal thoughts you must seek medical attention immediately by calling 911 or report to your local emergency department or urgent care.   If you have a non-emergency medical problem during routine business hours, please contact your provider's office and ask to speak with a nurse.       Please take the time to read instructions/literature along with the possible adverse reactions/side effects for all the Medicines that have been prescribed to you. Only take newly prescribed  Medications after you have completely understood and accept all the possible adverse reactions/side effects.   Do not take more than prescribed Medications for  Pain, Sleep and Anxiety. Do not drive when taking Pain medications or sleep aid/ insomnia  medications It is  not advisable to combine anxiety, sleep and pain medications without talking with your primary care practitioner    If you are experiencing a Mental Health or Behavioral Health Crisis or need someone to talk to Please call the Suicide and Crisis Lifeline: 988 You may also call the [place] National Suicide Prevention Lifeline: 321-018-1080 or TTY: 787-203-7017 TTY (931) 399-5377) to talk to a trained counselor.  You may call the Behavioral Health Crisis Line at (323)389-1008, at any time, 24 hours a day, 7 days a week- however If you are in danger or need immediate medical attention, call 911.   If you would like help to quit smoking, call 1-800-QUIT-NOW ( 269-589-0562) OR Espaol: 1-855-Djelo-Ya (7-253-664-4034) o para ms  informacin haga clic aqu or Text READY to 742-595 to register via text.   Susa Loffler , BSN, RN [place]   VBCI-Population Health RN Care Manager Direct Dial [phone]  Website: [email or web address]

## 2023-10-13 DIAGNOSIS — Z8701 Personal history of pneumonia (recurrent): Secondary | ICD-10-CM | POA: Diagnosis not present

## 2023-10-13 DIAGNOSIS — R911 Solitary pulmonary nodule: Secondary | ICD-10-CM | POA: Diagnosis not present

## 2023-10-13 DIAGNOSIS — Z8709 Personal history of other diseases of the respiratory system: Secondary | ICD-10-CM | POA: Diagnosis not present

## 2023-10-13 DIAGNOSIS — I350 Nonrheumatic aortic (valve) stenosis: Secondary | ICD-10-CM | POA: Diagnosis not present

## 2023-10-13 DIAGNOSIS — J449 Chronic obstructive pulmonary disease, unspecified: Secondary | ICD-10-CM | POA: Diagnosis not present

## 2023-10-15 ENCOUNTER — Other Ambulatory Visit: Payer: Self-pay

## 2023-10-15 NOTE — Patient Outreach (Signed)
 Care Management  Transitions of Care Program Transitions of Care Post-discharge week 2 She has been followed by [facility] since 1/3 due to  hospital readmission -Program completion    10/15/2023 Name: Crystal Silva MRN: [id number] DOB: July 14, 1961  Subjective: Crystal Silva is a 63 y.o. year old female who is a primary care patient of Benita Stabile, MD. The Care Management team Engaged with patient Engaged with patient by telephone to assess and address transitions of care needs.   Consent to Services:  Patient was given information about care management services, agreed to services, and gave verbal consent to participate.   Assessment:   Patient/ Caregiver  voices no new complaints or concerns  and has not developed/ reported any new medical issues / Dx or acute changes. - since last follow-up call for most recent   Hospital stay   2/13-2/26 / 2025  No reported or reviewed Hospital Readmissions / No Urgent Care Visits / Transfers for Acute Change in condition .   MD/Specialist or Consultant visits reported since last follow-up call- Seen by PCP 10/14/23. Med review completed She is resuming her Metoprolol at 25 mg BID She decided to stop the Protonix  She is in the process of completing Disability paperwork  Her PCP is follow-up with [employer]  in  order for her to obtain a portable concentrator. She also has contact info for [employer]  Patient was Alert & O x 4 ,responsive, able to voice needs, cooperative with visit   There were no noted or reported cognitive/ mental status changes during this assessment General status reviewed  patient presents as expected for age and current situation with  finding as noted in previous sections of assessment  Patient denies changes in ADL function or IADLs  at this time. She has noticed increased endurance and overall strength . She uses her Incentive Spirometer at times and was encouraged to use regularly.  Patient mood, expressions, emotional tone, insight,  judgement  and thought content as expected with no notable or reported change No reports of new onset or increased / uncontrolled pain, has chronic pain managed with medicinal and non-medicinal interventions.  No changes in skin, no rashes, wounds or open areas  Diet reviewed and  education provided as needed to promote  healthy diet  Therapy Services NA    Medication reconciliation/ review completed based on most recent medication list in EHR; and in review of recent Provider follow-up appointments- confirmed patient obtained / is taking all newly prescribed medications as instructed and is aware of any changes to previous medications regimen including dosage adjustments.  Reports  new changes in medication/treatment  regimen  during this assessment period.  Changes noted-Add Metoprolol 25mg  BID ,D/C Protonix  Medications and current treatments are effective for use intended. No adverse Drug reactions  reported No changes or modifications indicated at this time.She [place] in [place] Medic[place] assist Program [place] 09[place]    Patient self- manages  medications Patient is able to take medications without difficulty;  denies questions/ concerns and reports no barriers to medication adherence at this time   Patient educated on red flag s/s to watch for based on current discharge diagnosis and was encouraged to report, any changes in baseline or  medication regimen,   or any new unmanaged side effects or symptoms not relieved with interventions  to PCP and / or the  [facility] Case [facility] team .          SDOH Interventions  Flowsheet Row Telephone from 10/07/2023 in [place] [facility] Telephone from 08/14/2023 in [place] [facility]PART[facility]  SDOH Interventions    Food Insecurity Interventions Intervention Not Indicated Intervention Not Indicated  Housing Interventions Intervention Not Indicated Intervention Not Indicated  Transportation Interventions Patient  Resources (Friends/Family), Intervention Not Indicated Intervention Not Indicated, Patient Resources (Friends/Family)  Utilities Interventions Intervention Not Indicated Intervention Not Indicated  Social Connections Interventions -- Intervention Not Indicated        Goals Addressed             This Visit's Progress    COMPLETED: TOC Care Plan       Current Barriers:  Chronic Disease Management support and education needs related to COPD   RNCM Clinical Goal(s):  Patient will take all medications exactly as prescribed and will call provider for medication related questions as evidenced by no missed medication doses  attend all scheduled medical appointments: with PCP (08/21/23) and Pulmonologist  ( 08/25/23)- completed no changes  as evidenced by no missed appointment  continue to work with RN Care Manager to address care management and care coordination needs related to  COPD as evidenced by adherence to CM Team Scheduled appointments through collaboration with RN Care manager, provider, and care team.   Interventions: Evaluation of current treatment plan related to  self management and patient's adherence to plan as established by provider  Transitions of Care:  Goal Met. Doctor Visits  - discussed the importance of doctor visits Troubleshoot use of DME in the home  Post discharge activity limitations prescribed by provider reviewed Reviewed Signs and symptoms of infection  COPD Interventions:  (Status:  Goal Met. and Patient declined further engagement on this goal.) Short Term Goal Advised patient to track and manage COPD triggers Provided instruction about proper use of medications used for management of COPD including inhalers Advised patient to self assesses COPD action plan zone and make appointment with provider if in the yellow zone for 48 hours without improvement Advised patient to engage in light exercise as tolerated 3-5 days a week to aid in the the management of  COPD Provided education about and advised patient to utilize infection prevention strategies to reduce risk of respiratory infection Discussed the importance of adequate rest and management of fatigue with COPD Discussed Pulmonary Rehab and offered to assist with referral placement Assessed social determinant of health barriers  Patient Goals/Self-Care Activities: Participate in Transition of Care Program/Attend TOC scheduled calls Take all medications as prescribed Attend all scheduled provider appointments Call pharmacy for medication refills 3-7 days in advance of running out of medications Perform all self care activities independently  Perform IADL's (shopping, preparing meals, housekeeping, managing finances) independently Call provider office for new concerns or questions  identify and avoid work-related triggers identify and remove indoor air pollutants limit outdoor activity during cold weather do breathing exercises every day attend pulmonary rehabilitation eliminate symptom triggers at home keep follow-up appointments: with PCP and Pulmonology use an extra pillow to sleep eat healthy/prescribed diet: Heart Healthy, Low Fat Carb Modified  use devices that will help like a cane, sock-puller or reacher practice relaxation or meditation daily do breathing exercises every day do exercises in a comfortable position that makes breathing as easy as possible  Follow Up Plan:   No additional follow-up required at this time Patient  verbalizes understanding of instructions and care plan provided. Patient was encouraged to make informed decisions about their care, actively participate in managing their health  condition, and implement lifestyle changes as needed to promote independence and self-management of health care The patient has been provided with contact information for the care management team and has been advised to call with any health-related questions or concerns.         Plan:   The patient has successfully completed the  [facility] Program She has been followed by [facility] Nurse since 08/14/23. She had a hospital readmission last month but is stable now . Condition is stable No further acute needs identified at this time. Chronic conditions and ongoing care is  managed thru collaboration with  PCP,  Specialists and additional Healthcare Providers if indicated . Patient verbalized understanding of ongoing plan of care.  SDOH needs have been screened and interventions provided if identified.  Reviewed current home medications -- provided education as needed.  Previously discussed rationale of use, how/when to take medications. Patient is aware of potential side effects, and was encouraged to notify PCP for any changes in condition or signs / symptoms not relieved  with interventions.   Patient will call 911 for Medical Emergencies or Life -Threatening or report to a local emergency department or urgent care.   Patient was encouraged to Contact PCP  with any questions or concerns regarding ongoing  medical care, any  difficulty obtaining or picking up  prescriptions, any  changes or  worsening in  condition including signs / symptoms not relieved  with interventions Follow up as indicated with the  Care Team , or sooner should any new problems arise.   Patient had no additional questions or concerns at this time. Current needs addressed.   The patient has been provided with contact information for the care management team and has been advised to call with any health related questions or concerns.  Susa Loffler , BSN, RN [facility]   [facility] RN Care Manager Direct Dial [phone]  Fax: [phone] Website: [email or web address]

## 2023-10-15 NOTE — Patient Instructions (Signed)
 Visit Information  Thank you for taking time to visit with me today. Please don't hesitate to contact me if I can be of assistance to you   Following is a copy of your care plan:   Goals Addressed             This Visit's Progress    COMPLETED: TOC Care Plan       Current Barriers:  Chronic Disease Management support and education needs related to COPD   RNCM Clinical Goal(s):  Patient will take all medications exactly as prescribed and will call provider for medication related questions as evidenced by no missed medication doses  attend all scheduled medical appointments: with PCP (08/21/23) and Pulmonologist  ( 08/25/23)- completed no changes  as evidenced by no missed appointment  continue to work with RN Care Manager to address care management and care coordination needs related to  COPD as evidenced by adherence to CM Team Scheduled appointments through collaboration with RN Care manager, provider, and care team.   Interventions: Evaluation of current treatment plan related to  self management and patient's adherence to plan as established by provider  Transitions of Care:  Goal Met. Doctor Visits  - discussed the importance of doctor visits Troubleshoot use of DME in the home  Post discharge activity limitations prescribed by provider reviewed Reviewed Signs and symptoms of infection  COPD Interventions:  (Status:  Goal Met. and Patient declined further engagement on this goal.) Short Term Goal Advised patient to track and manage COPD triggers Provided instruction about proper use of medications used for management of COPD including inhalers Advised patient to self assesses COPD action plan zone and make appointment with provider if in the yellow zone for 48 hours without improvement Advised patient to engage in light exercise as tolerated 3-5 days a week to aid in the the management of COPD Provided education about and advised patient to utilize infection prevention strategies  to reduce risk of respiratory infection Discussed the importance of adequate rest and management of fatigue with COPD Discussed Pulmonary Rehab and offered to assist with referral placement Assessed social determinant of health barriers  Patient Goals/Self-Care Activities: Participate in Transition of Care Program/Attend TOC scheduled calls Take all medications as prescribed Attend all scheduled provider appointments Call pharmacy for medication refills 3-7 days in advance of running out of medications Perform all self care activities independently  Perform IADL's (shopping, preparing meals, housekeeping, managing finances) independently Call provider office for new concerns or questions  identify and avoid work-related triggers identify and remove indoor air pollutants limit outdoor activity during cold weather do breathing exercises every day attend pulmonary rehabilitation eliminate symptom triggers at home keep follow-up appointments: with PCP and Pulmonology use an extra pillow to sleep eat healthy/prescribed diet: Heart Healthy, Low Fat Carb Modified  use devices that will help like a cane, sock-puller or reacher practice relaxation or meditation daily do breathing exercises every day do exercises in a comfortable position that makes breathing as easy as possible  Follow Up Plan:   No additional follow-up required at this time Patient  verbalizes understanding of instructions and care plan provided. Patient was encouraged to make informed decisions about their care, actively participate in managing their health condition, and implement lifestyle changes as needed to promote independence and self-management of health care The patient has been provided with contact information for the care management team and has been advised to call with any health-related questions or concerns.  Medication review  Reviewed current home medications -- provided education and updates as  needed. Patient is aware of potential side effects and was encouraged to notify PCP for any adverse side effects or unwanted symptoms not relieved with interventions  Patient will call 911 for Medical Emergencies or Life -Threatening Symptoms.  Reviewed goals for care Patient/ Caregiver verbalizes understanding of instructions with the plan of care . The  Patient / Caregiver was encouraged to make informed decisions about care, actively participate in managing health conditions, and implement lifestyle changes as needed to promote independence and self-management of healthcare. SDOH screenings have been completed and addressed if indicted.  There are no reported barriers to care.    Follow-up Plan [facility] Nurse will provide follow-up and on-going assessment ,evaluation and education of disease processes, recommended interventions for both chronic and acute medical conditions ,  along with ongoing review of symptoms ,medication reviews / reconciliation during each weekly call . Any updates , inconsistencies, discrepancies or acute care concerns will be addressed and routed to the correct Practitioner if indicated   The patient has been provided with contact information for the care management team and has been advised to call with any health-related questions or concerns. Follow up call  with Care Team  as scheduled,or sooner should any new problems arise.  [facility]  Please call the care guide team at [phone]  if you need to cancel or reschedule your appointment . For scheduled calls -Three attempts will be made to reach you -if the scheduled call is missed or  we are unable to reach the you after 3 attempts no additional outreach attempts will be made and the TOC follow-up will be closed .   If you need to speak to a Nurse you may  call me directly at the number below or if I am unavailable,and  your need is urgent  please call the main [facility] number at [phone]  and ask to speak with one of the [facility] ( Transition of Care )  Nurses  .  Patient was encouraged to Contact PCP with any changes in baseline or  medication regimen,  changes in health status  /  well-being, safety concerns, including falls any questions or concerns regarding ongoing medical care, any difficulty obtaining or picking up prescriptions, any changes or worsening in condition- including  symptoms not relieved  with interventions                                                                            Additionally, If you experience worsening of your symptoms, develop shortness of breath, If you are experiencing a medical emergency,  develop suicidal or homicidal thoughts you must seek medical attention immediately by calling 911 or report to your local emergency department or urgent care.   If you have a non-emergency medical problem during routine business hours, please contact your provider's office and ask to speak with a nurse.       Please take the time to read instructions/literature along with the possible adverse reactions/side effects for all the Medicines that have been prescribed to you. Only take newly prescribed  Medications after you have completely understood and accept  all the possible adverse reactions/side effects.   Do not take more than prescribed Medications for  Pain, Sleep and Anxiety. Do not drive when taking Pain medications or sleep aid/ insomnia  medications It is not advisable to combine anxiety, sleep and pain medications without talking with your primary care practitioner    If you are experiencing a Mental Health or Behavioral Health Crisis or need someone to talk to Please call the Suicide and Crisis Lifeline: 988 You may also call the [place] National Suicide Prevention Lifeline: (947)795-1872 or TTY: 480-098-5284 TTY 5397991737) to talk to a trained counselor.  You may call the Behavioral Health Crisis Line at 484-640-8507, at any time, 24 hours a  day, 7 days a week- however If you are in danger or need immediate medical attention, call 911.   If you would like help to quit smoking, call 1-800-QUIT-NOW ( (316)650-6017) OR Espaol: 1-855-Djelo-Ya (0-347-425-9563) o para ms informacin haga clic aqu or Text READY to 875-643 to register via text.   Susa Loffler , BSN, RN [place]   [facility]Population [facility] RN Care Manager Direct Dial [phone]  Website: [email or web address]

## 2023-10-16 DIAGNOSIS — I10 Essential (primary) hypertension: Secondary | ICD-10-CM | POA: Diagnosis not present

## 2023-10-16 DIAGNOSIS — J9601 Acute respiratory failure with hypoxia: Secondary | ICD-10-CM | POA: Diagnosis not present

## 2024-01-27 ENCOUNTER — Ambulatory Visit: Payer: Self-pay | Admitting: Pulmonary Disease

## 2024-01-27 DIAGNOSIS — R911 Solitary pulmonary nodule: Secondary | ICD-10-CM

## 2024-03-30 ENCOUNTER — Ambulatory Visit
Admission: RE | Admit: 2024-03-30 | Discharge: 2024-03-30 | Disposition: A | Discharge status: Home / Self Care | Source: Ambulatory Visit | Attending: Pulmonary Disease | Admitting: Pulmonary Disease

## 2024-03-30 DIAGNOSIS — R911 Solitary pulmonary nodule: Secondary | ICD-10-CM

## 2024-04-04 ENCOUNTER — Other Ambulatory Visit

## 2024-04-14 ENCOUNTER — Ambulatory Visit: Payer: Self-pay | Admitting: Pulmonary Disease

## 2024-04-27 ENCOUNTER — Ambulatory Visit: Admitting: Pulmonary Disease

## 2024-05-17 ENCOUNTER — Emergency Department (HOSPITAL_COMMUNITY)

## 2024-05-17 ENCOUNTER — Emergency Department (HOSPITAL_COMMUNITY)
Admission: EM | Admit: 2024-05-17 | Discharge: 2024-06-11 | Disposition: E | Attending: Emergency Medicine | Admitting: Emergency Medicine

## 2024-05-17 DIAGNOSIS — I469 Cardiac arrest, cause unspecified: Secondary | ICD-10-CM | POA: Insufficient documentation

## 2024-05-17 DIAGNOSIS — J449 Chronic obstructive pulmonary disease, unspecified: Secondary | ICD-10-CM | POA: Diagnosis not present

## 2024-05-17 DIAGNOSIS — Z79899 Other long term (current) drug therapy: Secondary | ICD-10-CM | POA: Insufficient documentation

## 2024-05-17 DIAGNOSIS — I509 Heart failure, unspecified: Secondary | ICD-10-CM | POA: Diagnosis not present

## 2024-05-17 DIAGNOSIS — Z7982 Long term (current) use of aspirin: Secondary | ICD-10-CM | POA: Insufficient documentation

## 2024-05-17 LAB — I-STAT CG4 LACTIC ACID, ED: Lactic Acid, Venous: 13.8 mmol/L (ref 0.5–1.9)

## 2024-05-17 LAB — CBG MONITORING, ED: Glucose-Capillary: 108 mg/dL — ABNORMAL HIGH (ref 70–99)

## 2024-05-17 MED ORDER — VANCOMYCIN HCL IN DEXTROSE 1-5 GM/200ML-% IV SOLN: 1000.0000 mg | Freq: Once | INTRAVENOUS | Status: DC

## 2024-05-17 MED ORDER — SODIUM BICARBONATE 8.4 % IV SOLN
INTRAVENOUS | Status: DC | PRN
  Administered 2024-05-17 (×2): 50 meq via INTRAVENOUS

## 2024-05-17 MED ORDER — EPINEPHRINE 1 MG/10ML IV SOSY
PREFILLED_SYRINGE | INTRAVENOUS | Status: DC | PRN
  Administered 2024-05-17 (×3): 1 mg via INTRAVENOUS

## 2024-05-17 MED ORDER — EPINEPHRINE 1 MG/10ML IV SOSY
PREFILLED_SYRINGE | INTRAVENOUS | Status: DC | PRN
  Administered 2024-05-17 (×7): 1 mg via INTRAVENOUS

## 2024-05-17 MED ORDER — AMIODARONE HCL 150 MG/3ML IV SOLN
INTRAVENOUS | Status: DC | PRN
  Administered 2024-05-17: 150 mg via INTRAVENOUS

## 2024-05-17 MED ORDER — NOREPINEPHRINE 4 MG/250ML-% IV SOLN
0.0000 ug/min | INTRAVENOUS | Status: DC
  Administered 2024-05-17: 20 ug/min via INTRAVENOUS
  Filled 2024-05-17: qty 250

## 2024-05-17 MED ORDER — AMIODARONE IV BOLUS ONLY 150 MG/100ML
INTRAVENOUS | Status: DC | PRN
  Administered 2024-05-17: 300 mg via INTRAVENOUS

## 2024-05-17 MED ORDER — EPINEPHRINE HCL 5 MG/250ML IV SOLN IN NS
INTRAVENOUS | Status: AC
  Filled 2024-05-17: qty 250

## 2024-05-17 MED ORDER — PIPERACILLIN-TAZOBACTAM 3.375 G IVPB: 3.3750 g | Freq: Once | INTRAVENOUS | Status: DC

## 2024-05-18 LAB — MISCELLANEOUS TEST

## 2024-06-11 NOTE — Code Documentation (Signed)
Pulse check, PEA, CPR resumed  

## 2024-06-11 NOTE — ED Triage Notes (Signed)
 Pt bib ems from home where pt was found unresponsive and apneic. FD started CPR at approx 1628, ROSC around 1650. Pt given 5 amp epi, 1 amp bicarb and started on dopamine . Pt also went into vf and required defib X2. Arrives to ED assisting ventilation.

## 2024-06-11 NOTE — Code Documentation (Signed)
Pulse check, VF, defib at 200j, CPR resumed.

## 2024-06-11 NOTE — Code Documentation (Signed)
 Pulse check, vf, defib at 200j. CPR resumed.

## 2024-06-11 NOTE — Code Documentation (Signed)
 No pulse--CPR initiated.

## 2024-06-11 NOTE — ED Provider Notes (Signed)
  EMERGENCY DEPARTMENT AT [facility] Provider Note   CSN: [id number] Arrival date & time: 06/02/2024  1729     Patient presents with: Cardiac Arrest   Crystal Silva is a 63 y.o. female.   63 year old female with past medical history of CHF and COPD presenting to the emergency department after a cardiac arrest.  The patient was found down at home by her grandson.  The patient was pulseless with EMS arrival.  The patient was in ventricular fibrillation and was defibrillated twice.  Received 5 rounds of epinephrine  and route to the hospital and did have return of spontaneous circulation.     Cardiac Arrest      Prior to Admission medications   Medication Sig Start Date End Date Taking? Authorizing Provider  Albuterol -Budesonide  (AIRSUPRA) 90-80 MCG/ACT AERO Inhale 1 puff into the lungs every 4 (four) hours as needed (wheezing, shortness of breath).    [provider]  aspirin  EC 325 MG EC tablet Take 1 tablet (325 mg total) by mouth daily. 05/26/21   Gold, Wayne E, PA-C  furosemide  (LASIX ) 20 MG tablet Take 20 mg by mouth daily.    [provider]  ipratropium-albuterol  (DUONEB) 0.5-2.5 (3) MG/3ML SOLN Take 3 mLs by nebulization every 4 (four) hours as needed (Shortness of breath/wheezing). 06/17/22   Cheryle Page, MD  metoprolol  tartrate (LOPRESSOR ) 25 MG tablet Take 25 mg by mouth.    [provider]  Multiple Vitamin (MULTIVITAMIN WITH MINERALS) TABS tablet Take 1 tablet by mouth daily.    [provider]  OXYGEN  Inhale 3 L/m2 into the lungs continuous.    [provider]  pantoprazole  (PROTONIX ) 40 MG tablet Take 1 tablet (40 mg total) by mouth daily. Patient not taking: Reported on 10/15/2023 10/06/23   Elgergawy, Brayton RAMAN, MD  predniSONE  (STERAPRED UNI-PAK 21 TAB) 10 MG (21) TBPK tablet Use per package instruction Patient not taking: Reported on 10/15/2023 10/06/23   Elgergawy, Brayton RAMAN, MD  sertraline  (ZOLOFT ) 50 MG  tablet Take 50 mg by mouth daily.    [provider]  DOMINIC BECK 200-62.5-25 MCG/ACT AEPB Inhale 1 puff into the lungs daily. 04/13/23   [provider]    Allergies: Patient has no known allergies.    Review of Systems  Reason unable to perform ROS: Obtunded.    Updated Vital Signs BP (!) 74/64   Pulse (!) 109   Resp (!) 106   Ht 5' 9 (1.753 m)   LMP 12/02/2013   SpO2 100%   BMI 23.99 kg/m   Physical Exam  (all labs ordered are listed, but only abnormal results are displayed) Labs Reviewed  I-STAT CG4 LACTIC ACID, ED - Abnormal; Notable for the following components:      Result Value   Lactic Acid, Venous 13.8 (*)    All other components within normal limits  CBG MONITORING, ED - Abnormal; Notable for the following components:   Glucose-Capillary 108 (*)    All other components within normal limits  CBC  COMPREHENSIVE METABOLIC PANEL WITH GFR  BLOOD GAS, ARTERIAL  I-STAT CHEM 8, ED  I-STAT CG4 LACTIC ACID, ED  TROPONIN I (HIGH SENSITIVITY)  TROPONIN I (HIGH SENSITIVITY)    EKG: None  Radiology: DG Chest Portable 1 View Result Date: 02-Jun-2024 CLINICAL DATA:  Post chest tubes and intubation EXAM: PORTABLE CHEST 1 VIEW COMPARISON:  2024/06/02 FINDINGS: Postoperative changes in the mediastinum. An endotracheal tube is present with tip measuring about 3.8 cm above  the carina. An enteric tube has been placed with tip off the field of view but below the left hemidiaphragm. Bilateral chest tubes have been placed. As before, there is extensive subcutaneous emphysema throughout the chest. This limits evaluation of lungs and pleura. No gross pulmonary infiltration or consolidation. Heart size is normal. IMPRESSION: Appliances appear in satisfactory position. Severe diffuse subcutaneous emphysema limits evaluation of lungs and pleura. No obvious focal consolidation. Electronically Signed   By: Elsie Gravely M.D.   On: 06-13-2024 19:49   DG Chest Portable  1 View Result Date: 06-13-2024 CLINICAL DATA:  Endotracheal tube placement EXAM: PORTABLE CHEST 1 VIEW COMPARISON:  CT chest 03/30/2024.  Chest radiograph 09/30/2023 FINDINGS: Postoperative changes in the mediastinum. An endotracheal tube is present with tip measuring 3.6 cm above the carina. No enteric tube is visualized. Heart size is normal. There is severe subcutaneous emphysema throughout the chest and extending into the visualized neck, upper arms, and upper abdomen. This limits visualization of lungs and pleural spaces. Unable to evaluate for possible pneumothorax or pulmonary infiltrates. No definite pleural effusion. IMPRESSION: 1. Endotracheal tube appears in satisfactory position. 2. Extensive subcutaneous emphysema throughout the chest limits evaluation of lungs and pleura. Electronically Signed   By: Elsie Gravely M.D.   On: 06/13/2024 18:49     Procedure Name: Intubation Date/Time: 06/13/24 8:05 PM  Performed by: Ula Prentice SAUNDERS, MDPre-anesthesia Checklist: Patient identified Preoxygenation: Pre-oxygenation with 100% oxygen  Ventilation: Mask ventilation without difficulty Laryngoscope Size: Mac and 4 Grade View: Grade IV Tube size: 8.0 mm Number of attempts: 1 Airway Equipment and Method: Rigid stylet Placement Confirmation: ETT inserted through vocal cords under direct vision, Positive ETCO2, Breath sounds checked- equal and bilateral and CO2 detector Secured at: 25 cm Dental Injury: Teeth and Oropharynx as per pre-operative assessment     CHEST TUBE INSERTION  Date/Time: 2024/06/13 8:06 PM  Performed by: Ula Prentice SAUNDERS, MD Authorized by: Ula Prentice SAUNDERS, MD   Consent:    Consent given by:  Patient Universal protocol:    Immediately prior to procedure, a time out was called: yes     Patient identity confirmed:  Anonymous protocol, patient vented/unresponsive Pre-procedure details:    Skin preparation:  Chlorhexidine  Sedation:    Sedation type:  None Anesthesia:     Anesthesia method:  None Procedure details:    Placement location:  L lateral   Scalpel size:  11   Tube size (Fr):  16   Dissection instrument:  Finger   Tension pneumothorax: yes     Tube connected to:  Water  seal   Drainage characteristics:  Air only   Suture material:  0 silk   Dressing:  Petrolatum -impregnated gauze Post-procedure details:    Post-insertion x-ray findings: tube in good position     Procedure completion:  Tolerated CHEST TUBE INSERTION  Date/Time: 06-13-24 8:07 PM  Performed by: Ula Prentice SAUNDERS, MD Authorized by: Ula Prentice SAUNDERS, MD   Universal protocol:    Immediately prior to procedure, a time out was called: yes     Patient identity confirmed:  Anonymous protocol, patient vented/unresponsive Pre-procedure details:    Skin preparation:  Chlorhexidine  Sedation:    Sedation type:  None Anesthesia:    Anesthesia method:  None Procedure details:    Placement location:  R lateral   Scalpel size:  11   Tube size (Fr):  20   Dissection instrument:  Finger   Tube connected to:  Water  seal   Drainage characteristics:  Bloody  Suture material:  0 silk   Dressing:  Petrolatum -impregnated gauze Post-procedure details:    Post-insertion x-ray findings: tube in good position     Procedure completion:  Tolerated    Medications Ordered in the ED  EPINEPHrine  (ADRENALIN ) 1 MG/10ML injection (1 mg Intravenous Given 2024-05-23 1917)  amiodarone  (NEXTERONE ) 1.5 mg/mL IV bolus only (300 mg Intravenous New Bag/Given 23-May-2024 1727)  sodium bicarbonate  injection (50 mEq Intravenous Given 23-May-2024 1831)  amiodarone  (CORDARONE ) injection (150 mg Intravenous Given 05-23-24 1738)  EPINEPHrine  (ADRENALIN ) 1 MG/10ML injection (1 mg Intravenous Given 05/23/2024 1823)  norepinephrine  (LEVOPHED ) 4mg  in (0.016 mg/mL) premix infusion (20 mcg/min Intravenous New Bag/Given 2024-05-23 1755)  EPINEPHrine  NaCl 5-0.9 MG/250ML-% premix infusion (has no administration in time range)   vancomycin  (VANCOCIN ) IVPB 1000 mg/200 mL premix (has no administration in time range)  piperacillin-tazobactam (ZOSYN) IVPB 3.375 g (has no administration in time range)                                    Medical Decision Making 63 year old female with past medical history of CHF and COPD presenting to the emergency department today after a cardiac arrest.  Initially the patient was in PEA but was then in ventricular fibrillation and requiring defibrillation.  On arrival here the patient had an organized rhythm.  After she was moved to the bed she was found to be pulseless.  ACLS protocol was followed.  The patient was initially in PEA and then ventricular fibrillation.  The patient was given 2 boluses of amiodarone  and started on amiodarone  infusion.  Initially she did have return of spontaneous circulation.  She did have multiple episodes of pulselessness requiring CPR but would have ROSC.  Eventually she was maxed out on multiple pressors.  When she was stable enough for chest x-ray she was found to have significant bilateral subcutaneous emphysema and shortly after this the patient was found to be pulseless again.  Bilateral finger thoracostomy's were performed and the patient did have return of spontaneous circulation.  Chest tubes were then placed.  Eventually the patient coded multiple times and explained to the family that with her being maxed out on multiple pressors that coding the patient would be futile especially given the significant amount of downtime she has had and with this initially being an unwitnessed arrest.  I was called by nursing staff to reevaluate the patient and she was found to be pulseless with a rhythm of PEA with a rate in the 20s.  She did not have any organized cardiac activity on bedside ultrasound.  The patient was pronounced at 7:49 PM.  CRITICAL CARE Performed by: Prentice JONELLE Medicus   Total critical care time: 100 minutes  Critical care time was exclusive of  separately billable procedures and treating other patients.  Critical care was necessary to treat or prevent imminent or life-threatening deterioration.  Critical care was time spent personally by me on the following activities: development of treatment plan with patient and/or surrogate as well as nursing, discussions with consultants, evaluation of patient's response to treatment, examination of patient, obtaining history from patient or surrogate, ordering and performing treatments and interventions, ordering and review of laboratory studies, ordering and review of radiographic studies, pulse oximetry and re-evaluation of patient's condition.   Amount and/or Complexity of Data Reviewed Labs: ordered. Radiology: ordered.  Risk Prescription drug management. Decision regarding hospitalization.  Final diagnoses:  Cardiac arrest [facility])    ED Discharge Orders     None          Ula Prentice SAUNDERS, MD 05-27-24 2009

## 2024-06-11 NOTE — Code Documentation (Signed)
Lost pulses, CPR initiated.

## 2024-06-11 NOTE — Code Documentation (Signed)
 CPR resumed

## 2024-06-11 NOTE — Code Documentation (Signed)
 Begin cpr at 1934 1936 pulse check, no pulse 1939 epi given  1939 pt  has pulses

## 2024-06-11 NOTE — Code Documentation (Signed)
 MD doing L sided needle decompression.

## 2024-06-11 NOTE — Code Documentation (Signed)
Pulse check with ROSC.  

## 2024-06-11 NOTE — Code Documentation (Addendum)
Pulse check, ROSC

## 2024-06-11 NOTE — Progress Notes (Signed)
 Chaplain responded to request from patients nurse to visit with family whose love one was near death. Provided a ministry of presence with grandson and patients sister. Escorted patients niece to the room upon arrival.   Christal Lagerstrom  Chaplain

## 2024-06-11 NOTE — Code Documentation (Signed)
Patient time of death occurred at 1949 

## 2024-06-11 NOTE — Code Documentation (Signed)
Pulses lost, CPR started.

## 2024-06-11 NOTE — Code Documentation (Signed)
 Pulse check, PEA. Bedside cardiac US  being performed.

## 2024-06-11 NOTE — Code Documentation (Signed)
 Pulse check, PEA, CPR resumed,

## 2024-06-11 NOTE — Code Documentation (Signed)
Pulse check, ROSC

## 2024-06-11 NOTE — Progress Notes (Signed)
 Pharmacy Antibiotic Note  Crystal Silva is a 63 y.o. female admitted on 05/26/24 with sepsis.  Pharmacy has been consulted for vancomycin  and Zosyn dosing. Patient arrived to the ED brought in by EMS with cardiac arrest at ~17:20. Intermittently in cardiac arrest from arrival until ~18:30. Now on high doses of norepinephrine , epinephrine , and vasopressin.   Plan: -Give vancomycin  1000 mg x 1 -Give piperacillin/tazobactam 3.375 g x1 -Follow clinical status, renal function for further dosing.   Height: 5' 9 (175.3 cm) IBW/kg (Calculated) : 66.2  No data recorded.  Recent Labs  Lab 2024-05-26 1737  LATICACIDVEN 13.8*    CrCl cannot be calculated (Patient's most recent lab result is older than the maximum 21 days allowed.).    No Known Allergies  Antimicrobials this admission: None  Dose adjustments this admission: None  Microbiology results: None  Thank you for allowing pharmacy to be a part of this patient's care.  Maurilio LITTIE Patten 2024-05-26 7:07 PM

## 2024-06-11 NOTE — Code Documentation (Signed)
 Pulse check, VF, defib at 200j, CPR restarted,

## 2024-06-11 NOTE — Code Documentation (Signed)
Unable to palpate pulses.  CPR initiated.

## 2024-06-11 NOTE — Code Documentation (Signed)
 Pulse check VF, defib at 200j, CPR restarted.

## 2024-06-11 NOTE — Code Documentation (Signed)
 ROSC

## 2024-06-11 NOTE — Code Documentation (Signed)
 Pulse check, PEA, finger thoracotomy being performed by MD. CPR resumed.

## 2024-06-11 NOTE — ED Notes (Signed)
 Vasopressin initiated at 0.04 this time.

## 2024-06-11 NOTE — ED Notes (Signed)
 Assumed care at 1900 Levo at 100mcg per previous nurse MD aware. MD Tee at bedside and aware of dose, pharmacy at bedside and aware of dose.

## 2024-06-11 NOTE — ED Notes (Signed)
 Epi started at 20.

## 2024-06-11 DEATH — deceased
